# Patient Record
Sex: Male | Born: 1985 | Race: Black or African American | Hispanic: No | Marital: Single | Smoking: Current every day smoker
Health system: Southern US, Community
[De-identification: ages and names within clinical notes are randomized; demographics above are authoritative.]

## PROBLEM LIST (undated history)

## (undated) DIAGNOSIS — F32A Depression, unspecified: Secondary | ICD-10-CM

## (undated) DIAGNOSIS — R947 Abnormal results of other endocrine function studies: Secondary | ICD-10-CM

## (undated) DIAGNOSIS — F329 Major depressive disorder, single episode, unspecified: Secondary | ICD-10-CM

## (undated) DIAGNOSIS — F200 Paranoid schizophrenia: Secondary | ICD-10-CM

---

## 2014-06-27 ENCOUNTER — Emergency Department: Payer: Self-pay | Admitting: Emergency Medicine

## 2014-06-27 LAB — CBC
HCT: 53.3 % — AB (ref 40.0–52.0)
HGB: 17.7 g/dL (ref 13.0–18.0)
MCH: 31.2 pg (ref 26.0–34.0)
MCHC: 33.2 g/dL (ref 32.0–36.0)
MCV: 94 fL (ref 80–100)
Platelet: 198 10*3/uL (ref 150–440)
RBC: 5.67 10*6/uL (ref 4.40–5.90)
RDW: 13.8 % (ref 11.5–14.5)
WBC: 6.9 10*3/uL (ref 3.8–10.6)

## 2014-06-27 LAB — URINALYSIS, COMPLETE
BILIRUBIN, UR: NEGATIVE
BLOOD: NEGATIVE
Bacteria: NONE SEEN
GLUCOSE, UR: NEGATIVE mg/dL (ref 0–75)
Ketone: NEGATIVE
LEUKOCYTE ESTERASE: NEGATIVE
NITRITE: NEGATIVE
Ph: 7 (ref 4.5–8.0)
Protein: NEGATIVE
SPECIFIC GRAVITY: 1.015 (ref 1.003–1.030)

## 2014-06-27 LAB — DRUG SCREEN, URINE
AMPHETAMINES, UR SCREEN: NEGATIVE (ref ?–1000)
Barbiturates, Ur Screen: NEGATIVE (ref ?–200)
Benzodiazepine, Ur Scrn: NEGATIVE (ref ?–200)
Cannabinoid 50 Ng, Ur ~~LOC~~: NEGATIVE (ref ?–50)
Cocaine Metabolite,Ur ~~LOC~~: NEGATIVE (ref ?–300)
MDMA (ECSTASY) UR SCREEN: NEGATIVE (ref ?–500)
METHADONE, UR SCREEN: NEGATIVE (ref ?–300)
Opiate, Ur Screen: NEGATIVE (ref ?–300)
PHENCYCLIDINE (PCP) UR S: NEGATIVE (ref ?–25)
Tricyclic, Ur Screen: NEGATIVE (ref ?–1000)

## 2014-06-27 LAB — COMPREHENSIVE METABOLIC PANEL
ANION GAP: 4 — AB (ref 7–16)
Albumin: 4.1 g/dL (ref 3.4–5.0)
Alkaline Phosphatase: 76 U/L
BUN: 17 mg/dL (ref 7–18)
Bilirubin,Total: 0.5 mg/dL (ref 0.2–1.0)
CALCIUM: 9.4 mg/dL (ref 8.5–10.1)
CHLORIDE: 104 mmol/L (ref 98–107)
CO2: 30 mmol/L (ref 21–32)
Creatinine: 1.52 mg/dL — ABNORMAL HIGH (ref 0.60–1.30)
EGFR (African American): >60
GFR CALC NON AF AMER: 58 — AB
GLUCOSE: 53 mg/dL — AB (ref 65–99)
OSMOLALITY: 275 (ref 275–301)
POTASSIUM: 3.7 mmol/L (ref 3.5–5.1)
SGOT(AST): 36 U/L (ref 15–37)
SGPT (ALT): 46 U/L
SODIUM: 138 mmol/L (ref 136–145)
Total Protein: 8.1 g/dL (ref 6.4–8.2)

## 2014-06-27 LAB — ETHANOL: Ethanol: <3 mg/dL

## 2014-06-27 LAB — ACETAMINOPHEN LEVEL: Acetaminophen: <2 ug/mL

## 2014-06-27 LAB — SALICYLATE LEVEL: Salicylates, Serum: <1.7 mg/dL

## 2014-08-23 ENCOUNTER — Emergency Department: Payer: Self-pay | Admitting: Emergency Medicine

## 2014-10-15 NOTE — Consult Note (Signed)
Psychiatry: Follow up this aptient with schizophrenia. He has been med complient and calm throughout stay. Today reports he is calmer. Says he feels confident of not hurting anyone when he goes back. Says he feels safe and comfortable going to group home,  Still has AH but not acting on stimuli. Affect calm and appropriate. and no longer requires inpatient treatment. Counceling done. Continue changed medication and scripts done. Discussed with Dr Inocencio HomesGayle. Will DC the IVC and return to group home follow up with community mental health.Schizophrenia.  Electronic Signatures: Audery Amellapacs, [name] (MD)  (Signed on 14-Jan-16 11:00)  Authored  Last Updated: 14-Jan-16 11:00 by Audery Amellapacs, [name] (MD)
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
## 2014-10-15 NOTE — Consult Note (Signed)
Psychiatry: Follow-up for this patient with a history of schizophrenia.  No new complaints.  Continues to have intermittent hallucinations.  Denies any suicidal or homicidal ideation.  Compliant with medicines.  No findings on current review of systems. mental status neatly groomed young man looks his stated age.  Compliant with medication.  Eye contact a bit intense.  Speech decreased in total amount affect constricted.  Denies suicidal or homicidal ideation and endorses still occasional hallucinations. has not had any behavior problems aggression and threats or violence in the emergency room.  He has been compliant with medication.  I had ordered admission for this patient yesterday but admission was refused by the psychiatric nursing staff.  He is currently in the emergency room [facility] although I'm not sure if he really needs that level of care.  Continue current treatment with quetiapine.  May be ready to be discharged fairly soon if behavior continues to be calm. schizophrenia  Electronic Signatures: Audery Amellapacs, Sayge Salvato T (MD)  (Signed on 13-Jan-16 18:03)  Authored  Last Updated: 13-Jan-16 18:03 by Audery Amellapacs, Nyonna Hargrove T (MD)
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
## 2014-11-28 ENCOUNTER — Emergency Department
Admission: EM | Admit: 2014-11-28 | Discharge: 2014-11-29 | Disposition: A | Discharge status: Other Acute Inpatient Hospital | Payer: Medicaid Other | Attending: Student | Admitting: Student

## 2014-11-28 ENCOUNTER — Encounter: Payer: Self-pay | Admitting: *Deleted

## 2014-11-28 DIAGNOSIS — F1210 Cannabis abuse, uncomplicated: Secondary | ICD-10-CM | POA: Diagnosis not present

## 2014-11-28 DIAGNOSIS — F1310 Sedative, hypnotic or anxiolytic abuse, uncomplicated: Secondary | ICD-10-CM | POA: Insufficient documentation

## 2014-11-28 DIAGNOSIS — F200 Paranoid schizophrenia: Secondary | ICD-10-CM | POA: Insufficient documentation

## 2014-11-28 DIAGNOSIS — Z008 Encounter for other general examination: Secondary | ICD-10-CM | POA: Diagnosis present

## 2014-11-28 LAB — COMPREHENSIVE METABOLIC PANEL
ALBUMIN: 4.4 g/dL (ref 3.5–5.0)
ALT: 19 U/L (ref 17–63)
ANION GAP: 7 (ref 5–15)
AST: 29 U/L (ref 15–41)
Alkaline Phosphatase: 58 U/L (ref 38–126)
BUN: 22 mg/dL — AB (ref 6–20)
CALCIUM: 9.6 mg/dL (ref 8.9–10.3)
CO2: 27 mmol/L (ref 22–32)
CREATININE: 1.36 mg/dL — AB (ref 0.61–1.24)
Chloride: 106 mmol/L (ref 101–111)
GFR calc Af Amer: >60 mL/min (ref >60)
GFR calc non Af Amer: >60 mL/min (ref >60)
Glucose, Bld: 78 mg/dL (ref 65–99)
Potassium: 3.9 mmol/L (ref 3.5–5.1)
Sodium: 140 mmol/L (ref 135–145)
TOTAL PROTEIN: 7.5 g/dL (ref 6.5–8.1)
Total Bilirubin: 0.9 mg/dL (ref 0.3–1.2)

## 2014-11-28 LAB — URINE DRUG SCREEN, QUALITATIVE (ARMC ONLY)
Amphetamines, Ur Screen: NOT DETECTED
BENZODIAZEPINE, UR SCRN: NOT DETECTED
Barbiturates, Ur Screen: NOT DETECTED
Cannabinoid 50 Ng, Ur ~~LOC~~: POSITIVE — AB
Cocaine Metabolite,Ur ~~LOC~~: NOT DETECTED
MDMA (Ecstasy)Ur Screen: NOT DETECTED
METHADONE SCREEN, URINE: NOT DETECTED
Opiate, Ur Screen: NOT DETECTED
PHENCYCLIDINE (PCP) UR S: NOT DETECTED
Tricyclic, Ur Screen: POSITIVE — AB

## 2014-11-28 LAB — ETHANOL

## 2014-11-28 LAB — CBC
HCT: 46.6 % (ref 40.0–52.0)
HEMOGLOBIN: 15.9 g/dL (ref 13.0–18.0)
MCH: 31.6 pg (ref 26.0–34.0)
MCHC: 34 g/dL (ref 32.0–36.0)
MCV: 92.8 fL (ref 80.0–100.0)
PLATELETS: 148 10*3/uL — AB (ref 150–440)
RBC: 5.02 MIL/uL (ref 4.40–5.90)
RDW: 13.8 % (ref 11.5–14.5)
WBC: 5.6 10*3/uL (ref 3.8–10.6)

## 2014-11-28 LAB — SALICYLATE LEVEL: Salicylate Lvl: <4 mg/dL (ref 2.8–30.0)

## 2014-11-28 LAB — ACETAMINOPHEN LEVEL: Acetaminophen (Tylenol), Serum: <10 ug/mL — ABNORMAL LOW (ref 10–30)

## 2014-11-28 NOTE — ED Notes (Signed)
Pt given snack tray. 

## 2014-11-28 NOTE — ED Notes (Signed)
Pt has been up to the BR    He is standing in the dayroom watching TV

## 2014-11-28 NOTE — BH Assessment (Signed)
Assessment Note  Anthony Luna is an 29 y.o. male, presents to the ED with a h/o schizophrenia with hearing voices telling him to hurt himself and other people; states that, his brother recently died; and that, he does not want to be here anymore. "I have been hearing voices for a couple of days; they tell me to do stuff; they keep talking; i want them to stop; I don't want to go back to that group home; I don't like to take medicine."  Axis I: Schizoaffective Disorder Axis II: Deferred Axis III: History reviewed. No pertinent past medical history. Axis IV: other psychosocial or environmental problems, problems with access to health care services and problems with primary support group Axis V: 31-40 impairment in reality testing  Past Medical History: History reviewed. No pertinent past medical history.  History reviewed. No pertinent past surgical history.  Family History: History reviewed. No pertinent family history.  Social History:  has no tobacco, alcohol, and drug history on file.  Additional Social History:     CIWA: CIWA-Ar BP: 125/85 mmHg Pulse Rate: (!) 102 COWS:    Allergies: No Known Allergies  Home Medications:  (Not in a hospital admission)  OB/GYN Status:  No LMP for male patient.  General Assessment Data Location of Assessment: [facility] ED TTS Assessment: In system Is this a Tele or Face-to-Face Assessment?: Face-to-Face Is this an Initial Assessment or a Re-assessment for this encounter?: Re-Assessment Marital status: Single Maiden name: none Is patient pregnant?: No Pregnancy Status: No Living Arrangements: Group Home Can pt return to current living arrangement?: Yes Admission Status: Voluntary Is patient capable of signing voluntary admission?: Yes Referral Source: Other Insurance type: [place]  Medical Screening Exam [facility] Walk-in ONLY) Medical Exam completed: Yes  Crisis Care Plan Living Arrangements: Group Home Name of Psychiatrist:  unknown Name of Therapist: unknown  Education Status Is patient currently in school?: No Current Grade: n/a Highest grade of school patient has completed: 10th Name of school: n/a Contact person: unknown  Risk to self with the past 6 months Suicidal Ideation: Yes-Currently Present (per the voices) Has patient been a risk to self within the past 6 months prior to admission? : No Suicidal Intent: No Has patient had any suicidal intent within the past 6 months prior to admission? : No Is patient at risk for suicide?: No Suicidal Plan?: No Has patient had any suicidal plan within the past 6 months prior to admission? : No Access to Means: No What has been your use of drugs/alcohol within the last 12 months?: uds (t) cannabis Previous Attempts/Gestures: No How many times?: 0 Other Self Harm Risks: hearing voices Triggers for Past Attempts: None known Intentional Self Injurious Behavior: None Family Suicide History: Unknown Recent stressful life event(s): Other (Comment) Persecutory voices/beliefs?: Yes Depression: Yes Depression Symptoms: Despondent Substance abuse history and/or treatment for substance abuse?: Yes Suicide prevention information given to non-admitted patients: Yes  Risk to Others within the past 6 months Homicidal Ideation: No Does patient have any lifetime risk of violence toward others beyond the six months prior to admission? : No Thoughts of Harm to Others: No Current Homicidal Intent: No Current Homicidal Plan: No Access to Homicidal Means: No Identified Victim: none History of harm to others?: No Assessment of Violence: On admission Violent Behavior Description: denies Does patient have access to weapons?: No Criminal Charges Pending?: No Does patient have a court date: No Is patient on probation?: No  Psychosis Hallucinations: Auditory Delusions: Unspecified  Mental  Status Report Appearance/Hygiene: In scrubs, Unremarkable Eye Contact:  Poor Motor Activity: Unremarkable Speech: Slow, Incoherent Level of Consciousness: Drowsy, Sleeping Mood: Other (Comment) Affect: Inconsistent with thought content Anxiety Level: Minimal Thought Processes: Circumstantial Judgement: Impaired Orientation: Person Obsessive Compulsive Thoughts/Behaviors: Minimal  Cognitive Functioning Concentration: Fair Memory: Recent Impaired Insight: Poor Impulse Control: Fair Appetite: Good Weight Loss: 0 Weight Gain: 0 Sleep: Decreased Total Hours of Sleep: 3 Vegetative Symptoms: None  ADLScreening [facility] Assessment Services) Patient's cognitive ability adequate to safely complete daily activities?: Yes Patient able to express need for assistance with ADLs?: Yes Independently performs ADLs?: Yes (appropriate for developmental age)  Prior Inpatient Therapy Prior Inpatient Therapy:  (unknown) Prior Therapy Dates: unknown Prior Therapy Facilty/Provider(s): unknown Reason for Treatment: shizophrenia  Prior Outpatient Therapy Prior Outpatient Therapy: Yes Prior Therapy Dates: unknown Prior Therapy Facilty/Provider(s): unknown Reason for Treatment: schizophrenia Does patient have an ACCT team?: Unknown Does patient have Intensive In-House Services?  : Unknown Does patient have [facility] services? : Unknown Does patient have P4CC services?: Unknown  ADL Screening (condition at time of admission) Patient's cognitive ability adequate to safely complete daily activities?: Yes Patient able to express need for assistance with ADLs?: Yes Independently performs ADLs?: Yes (appropriate for developmental age)       Abuse/Neglect Assessment (Assessment to be complete while patient is alone) Physical Abuse: Denies Verbal Abuse: Denies Sexual Abuse: Denies Exploitation of patient/patient's resources: Denies Self-Neglect: Denies Values / Beliefs Cultural Requests During Hospitalization: None Spiritual Requests During Hospitalization:  None Consults Spiritual Care Consult Needed: No Social Work Consult Needed: No      Additional Information 1:1 In Past 12 Months?:  (unknown) CIRT Risk: No Elopement Risk: No Does patient have medical clearance?: Yes  Child/Adolescent Assessment Running Away Risk: Denies Bed-Wetting: Denies Destruction of Property: Denies Cruelty to Animals: Denies Stealing: Denies Rebellious/Defies Authority: Denies Satanic Involvement: Denies Archivist: Denies Problems at [employer]: Denies Gang Involvement: Denies  Disposition:  Disposition Initial Assessment Completed for this Encounter: Yes Disposition of Patient: Referred to (psych MD to see)  On Site Evaluation by:   Reviewed with Physician:    Dwan Bolt 11/28/2014 7:17 PM

## 2014-11-28 NOTE — ED Notes (Signed)
Patient observed lying in bed with eyes closed  Even, unlabored respirations observed   NAD pt appears to be sleeping  I will continue to monitor along with every 15 minute visual observations and ongoing security camera monitoring    

## 2014-11-28 NOTE — ED Notes (Signed)

## 2014-11-28 NOTE — ED Notes (Signed)
BEHAVIORAL HEALTH ROUNDING Patient sleeping: No. Patient alert and oriented: yes Behavior appropriate: Yes.   Nutrition and fluids offered: Yes  Toileting and hygiene offered: Yes  Sitter present: yes Law enforcement present: Yes   Lunch tray given to pt

## 2014-11-28 NOTE — ED Notes (Signed)

## 2014-11-28 NOTE — ED Notes (Signed)
BEHAVIORAL HEALTH ROUNDING Patient sleeping: No. Patient alert and oriented: yes Behavior appropriate: Yes.  ; If no, describe:  Nutrition and fluids offered: yes Toileting and hygiene offered: Yes  Sitter present: q15 minute observations and security camera monitoring Law enforcement present: Yes  ODS  

## 2014-11-28 NOTE — ED Notes (Signed)
BEHAVIORAL HEALTH ROUNDING Patient sleeping: No. Patient alert and oriented: yes Behavior appropriate: Yes.  ;  Nutrition and fluids offered: Yes  Toileting and hygiene offered: Yes  Sitter present: yes Law enforcement present: Yes  

## 2014-11-28 NOTE — ED Provider Notes (Signed)
-----------------------------------------   3:35 PM on 11/28/2014 -----------------------------------------   BP 125/85 mmHg  Pulse 102  Temp(Src) 98.6 F (37 C) (Oral)  Resp 18  Ht 6\' 1"  (1.854 m)  Wt 189 lb (85.73 kg)  BMI 24.94 kg/m2  SpO2 96%  Spoke with Dr. Toni Amend in person who will admit this patient for further treatment.  He is stable at this time.  Loleta Rose, MD 11/28/14 1536

## 2014-11-28 NOTE — ED Notes (Signed)
Report received from Amy RN. Patient care assumed. Patient/RN introduction complete. Will continue to monitor.  

## 2014-11-28 NOTE — ED Notes (Signed)
Pt laying in bed.  

## 2014-11-28 NOTE — ED Notes (Signed)
Pt sitting in room watching tv, calm and cooperative at this time pending admission to beh medicine.

## 2014-11-28 NOTE — ED Notes (Signed)
Supper provided along with an extra drink  Pt observed with no unusual behavior  Appropriate to stimulation  No verbalized needs or concerns at this time  NAD assessed  md [name] ordered pt to be admitted to [facility] when staff and bed become available   Continue to monitor

## 2014-11-28 NOTE — ED Notes (Signed)
ED BHU PLACEMENT JUSTIFICATION Is the patient under IVC or is there intent for IVC: No. Is the patient medically cleared: Yes.   Is there vacancy in the ED BHU: Yes.   Is the population mix appropriate for patient: Yes.   Is the patient awaiting placement in inpatient or outpatient setting: Yes.   Has the patient had a psychiatric consult: Yes.   Survey of unit performed for contraband, proper placement and condition of furniture, tampering with fixtures in bathroom, shower, and each patient room: Yes.  ; Findings:  APPEARANCE/BEHAVIOR calm, cooperative and adequate rapport can be established NEURO ASSESSMENT Orientation: time, place and person Hallucinations: No.None noted (Hallucinations) Speech: Normal Gait: normal RESPIRATORY ASSESSMENT Normal expansion.  Clear to auscultation.  No rales, rhonchi, or wheezing. CARDIOVASCULAR ASSESSMENT regular rate and rhythm, S1, S2 normal, no murmur, click, rub or gallop GASTROINTESTINAL ASSESSMENT soft, nontender, BS WNL, no r/g EXTREMITIES normal strength, tone, and muscle mass PLAN OF CARE Provide calm/safe environment. Vital signs assessed twice daily. ED BHU Assessment once each 12-hour shift. Collaborate with intake RN daily or as condition indicates. Assure the ED provider has rounded once each shift. Provide and encourage hygiene. Provide redirection as needed. Assess for escalating behavior; address immediately and inform ED provider.  Assess family dynamic and appropriateness for visitation as needed: Yes.  ; If necessary, describe findings:  Educate the patient/family about BHU procedures/visitation: Yes.  ; If necessary, describe findings:  

## 2014-11-28 NOTE — ED Provider Notes (Signed)
[facility] Emergency Department Provider Note  ____________________________________________  Time seen: Approximately 945 AM  I have reviewed the triage vital signs and the nursing notes.   HISTORY  Chief Complaint Psychiatric Evaluation    HPI Anthony Luna is a 29 y.o. male with a past history of schizophrenia who presents today without complaint. For his group home he has been saying that he has been hearing voices to kill himself and per nursing triage says my brother died not just wanted be there anymore. He denies any of this to me. Denies any plan for SI or HI. Denies hearing voices at this time. Denies any harmful acts or attempts to hurt himself with ingestion. Admits to smoking marijuana yesterday. Denies smoking cocaine.   Medical history pertinent for schizophrenia  There are no active problems to display for this patient.   History reviewed. No pertinent past surgical history.  No current outpatient prescriptions on file.  Allergies Review of patient's allergies indicates no known allergies.  History reviewed. No pertinent family history.  Social History Smokes one half pack per day of cigarettes. No drinking. Admits to smoking marijuana.  Review of Systems Constitutional: No fever/chills Eyes: No visual changes. ENT: No sore throat. Cardiovascular: Denies chest pain. Respiratory: Denies shortness of breath. Gastrointestinal: No abdominal pain.  No nausea, no vomiting.  No diarrhea.  No constipation. Genitourinary: Negative for dysuria. Musculoskeletal: Negative for back pain. Skin: Negative for rash. Neurological: Negative for headaches, focal weakness or numbness. Psychiatric:Denies any psychiatric complaints at this time.  10-point ROS otherwise negative.  ____________________________________________   PHYSICAL EXAM:  VITAL SIGNS: ED Triage Vitals  Enc Vitals Group     BP 11/28/14 0907 125/85 mmHg     Pulse  Rate 11/28/14 0907 102     Resp 11/28/14 0907 18     Temp 11/28/14 0907 98.6 F (37 C)     Temp Source 11/28/14 0907 Oral     SpO2 11/28/14 0907 96 %     Weight 11/28/14 0907 189 lb (85.73 kg)     Height 11/28/14 0907  (1.854 m)     Head Cir --      Peak Flow --      Pain Score --      Pain Loc --      Pain Edu? --      Excl. in GC? --     Constitutional: Alert and oriented. Well appearing and in no acute distress. Eyes: Conjunctivae are normal. PERRL. EOMI. Head: Atraumatic. Nose: No congestion/rhinnorhea. Mouth/Throat: Mucous membranes are moist.  Oropharynx non-erythematous. Neck: No stridor.   Cardiovascular: Normal rate, regular rhythm. Grossly normal heart sounds.  Good peripheral circulation. Respiratory: Normal respiratory effort.  No retractions. Lungs CTAB. Gastrointestinal: Soft and nontender. No distention. No abdominal bruits. No CVA tenderness. Musculoskeletal: No lower extremity tenderness nor edema.  No joint effusions. Neurologic:  Normal speech and language. No gross focal neurologic deficits are appreciated. Speech is normal. No gait instability. Skin:  Skin is warm, dry and intact. No rash noted. Psychiatric: Flat affect. Not overtly responding to any internal stimuli. Denies suicidal or homicidal ideation. Speech and behavior are normal.  ____________________________________________   LABS (all labs ordered are listed, but only abnormal results are displayed)  Labs Reviewed  ACETAMINOPHEN LEVEL - Abnormal; Notable for the following:    Acetaminophen (Tylenol), Serum <10 (*)    All other components within normal limits  CBC - Abnormal; Notable for the following:  Platelets 148 (*)    All other components within normal limits  COMPREHENSIVE METABOLIC PANEL - Abnormal; Notable for the following:    BUN 22 (*)    Creatinine, Ser 1.36 (*)    All other components within normal limits  URINE DRUG SCREEN, QUALITATIVE ([facility] ONLY) - Abnormal; Notable  for the following:    Tricyclic, Ur Screen POSITIVE (*)    Cannabinoid 50 Ng, Ur [place] POSITIVE (*)    All other components within normal limits  ETHANOL  SALICYLATE LEVEL   ____________________________________________  EKG   ____________________________________________  RADIOLOGY   ____________________________________________   PROCEDURES   ____________________________________________   INITIAL IMPRESSION / ASSESSMENT AND PLAN / ED COURSE  Pertinent labs & imaging results that were available during my care of the patient were reviewed by me and considered in my medical decision making (see chart for details).  ----------------------------------------- 10:58 AM on 11/28/2014 -----------------------------------------  Patient, and cooperative and denying any SI, HI or hallucinations at this time. Will not IVC at this time. We will have psychiatry and behavioral intake assessed patient. ____________________________________________   FINAL CLINICAL IMPRESSION(S) / ED DIAGNOSES  Acute schizophrenia. Return visit.    Myrna Blazer, MD 11/28/14 1059

## 2014-11-28 NOTE — ED Notes (Signed)
Pt transferred into ED BHU room 5    Patient assigned to appropriate care area. Patient oriented to unit/care area: Informed that, for their safety, care areas are designed for safety and monitored by security cameras at all times; Visiting hours and phone times explained to patient. Patient verbalizes understanding, and verbal contract for safety obtained.     

## 2014-11-28 NOTE — ED Notes (Signed)
ENVIRONMENTAL ASSESSMENT Potentially harmful objects out of patient reach: Yes.   Personal belongings secured: Yes.   Patient dressed in hospital provided attire only: Yes.   Plastic bags out of patient reach: Yes.   Patient care equipment (cords, cables, call bells, lines, and drains) shortened, removed, or accounted for: Yes.   Equipment and supplies removed from bottom of stretcher: Yes.   Potentially toxic materials out of patient reach: Yes.   Sharps container removed or out of patient reach: Yes.     BEHAVIORAL HEALTH ROUNDING Patient sleeping: No. Patient alert and oriented: yes Behavior appropriate: Yes.  ; If no, describe:  Nutrition and fluids offered: yes Toileting and hygiene offered: Yes  Sitter present: q15 minute observations and security camera monitoring Law enforcement present: Yes  ODS  

## 2014-11-28 NOTE — ED Notes (Signed)

## 2014-11-28 NOTE — ED Notes (Signed)
Pt denies SI or HI stating "My brother died and I just dont want to be there anymore", pt then proceeds to put his face in his hands and go quiet

## 2014-11-28 NOTE — ED Notes (Signed)
BEHAVIORAL HEALTH ROUNDING Patient sleeping: Yes.   Patient alert and oriented: eyes closed  Appears asleep Behavior appropriate: Yes.  ; If no, describe:  Nutrition and fluids offered: Yes  Toileting and hygiene offered: sleeping Sitter present: q 15 minute observations and security camera monitoring Law enforcement present: yes  ODS 

## 2014-11-28 NOTE — ED Notes (Signed)
[name] is in consulting with pt at this time  Pt observed with no unusual behavior  Appropriate to stimulation  No verbalized needs or concerns at this time  NAD assessed  Continue to monitor
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
## 2014-11-28 NOTE — ED Notes (Signed)
ED BHU PLACEMENT JUSTIFICATION Is the patient under IVC or is there intent for IVC: no Is the patient medically cleared: Yes.   Is there vacancy in the ED BHU: Yes.   Is the population mix appropriate for patient: Yes.   Is the patient awaiting placement in inpatient or outpatient setting: No. Has the patient had a psychiatric consult: No. Survey of unit performed for contraband, proper placement and condition of furniture, tampering with fixtures in bathroom, shower, and each patient room: Yes.  ; Findings:  APPEARANCE/BEHAVIOR Calm and cooperative NEURO ASSESSMENT Orientation: oriented x3  Denies pain Hallucinations: No.None noted (Hallucinations) Speech: Normal Gait: normal RESPIRATORY ASSESSMENT even unlabored respirations  CARDIOVASCULAR ASSESSMENT Pulses equal  regular rate  Skin warm and dry GASTROINTESTINAL ASSESSMENT no GI complaint EXTREMITIES Full ROM PLAN OF CARE Provide calm/safe environment. Vital signs assessed twice daily. ED BHU Assessment once each 12-hour shift. Collaborate with intake RN daily or as condition indicates. Assure the ED provider has rounded once each shift. Provide and encourage hygiene. Provide redirection as needed. Assess for escalating behavior; address immediately and inform ED provider.  Assess family dynamic and appropriateness for visitation as needed: Yes.  ; If necessary, describe findings:  Educate the patient/family about BHU procedures/visitation: Yes.  ; If necessary, describe findings:   

## 2014-11-28 NOTE — ED Notes (Signed)
Pt observed sitting in the dayroom with the blanket over his head    Appropriate to stimulation  No verbalized needs or concerns at this time  NAD assessed  Continue to monitor

## 2014-11-28 NOTE — ED Notes (Signed)
Pt brought in from [facility] group home with reports from staff that pt has been reporting hearing voices that are telling him to hurt himself. Pt denies this. Pt states that he hears voices but they are telling him to "just listen". Caregiver reports that pt has been running away at night and smoking crack and marijuana.

## 2014-11-29 ENCOUNTER — Inpatient Hospital Stay
Admission: EM | Admit: 2014-11-29 | Discharge: 2014-12-11 | DRG: 885 | Disposition: A | Discharge status: Home / Self Care | Payer: Medicaid Other | Source: Intra-hospital | Attending: Psychiatry | Admitting: Psychiatry

## 2014-11-29 DIAGNOSIS — F17210 Nicotine dependence, cigarettes, uncomplicated: Secondary | ICD-10-CM | POA: Diagnosis present

## 2014-11-29 DIAGNOSIS — R451 Restlessness and agitation: Secondary | ICD-10-CM | POA: Diagnosis present

## 2014-11-29 DIAGNOSIS — F1290 Cannabis use, unspecified, uncomplicated: Secondary | ICD-10-CM | POA: Diagnosis present

## 2014-11-29 DIAGNOSIS — G2571 Drug induced akathisia: Secondary | ICD-10-CM | POA: Diagnosis present

## 2014-11-29 DIAGNOSIS — F1220 Cannabis dependence, uncomplicated: Secondary | ICD-10-CM

## 2014-11-29 DIAGNOSIS — E8881 Metabolic syndrome: Secondary | ICD-10-CM | POA: Diagnosis present

## 2014-11-29 DIAGNOSIS — Z638 Other specified problems related to primary support group: Secondary | ICD-10-CM

## 2014-11-29 DIAGNOSIS — F250 Schizoaffective disorder, bipolar type: Principal | ICD-10-CM | POA: Diagnosis present

## 2014-11-29 DIAGNOSIS — Z818 Family history of other mental and behavioral disorders: Secondary | ICD-10-CM

## 2014-11-29 DIAGNOSIS — R45851 Suicidal ideations: Secondary | ICD-10-CM | POA: Diagnosis present

## 2014-11-29 DIAGNOSIS — F17200 Nicotine dependence, unspecified, uncomplicated: Secondary | ICD-10-CM

## 2014-11-29 DIAGNOSIS — Z79899 Other long term (current) drug therapy: Secondary | ICD-10-CM

## 2014-11-29 HISTORY — DX: Abnormal results of other endocrine function studies: R94.7

## 2014-11-29 LAB — LIPID PANEL
Cholesterol: 198 mg/dL (ref 0–200)
HDL: 53 mg/dL (ref >40)
LDL Cholesterol: 134 mg/dL — ABNORMAL HIGH (ref 0–99)
Total CHOL/HDL Ratio: 3.7 RATIO
Triglycerides: 57 mg/dL (ref <150)
VLDL: 11 mg/dL (ref 0–40)

## 2014-11-29 MED ORDER — ACETAMINOPHEN 325 MG PO TABS
650.0000 mg | ORAL_TABLET | Freq: Four times a day (QID) | ORAL | Status: DC | PRN
  Administered 2014-12-04 – 2014-12-05 (×2): 650 mg via ORAL
  Filled 2014-11-29 (×2): qty 2

## 2014-11-29 MED ORDER — BENZTROPINE MESYLATE 1 MG PO TABS
1.0000 mg | ORAL_TABLET | Freq: Two times a day (BID) | ORAL | Status: DC
  Administered 2014-11-29 – 2014-12-04 (×10): 1 mg via ORAL
  Filled 2014-11-29 (×10): qty 1

## 2014-11-29 MED ORDER — ALUM & MAG HYDROXIDE-SIMETH 200-200-20 MG/5ML PO SUSP
30.0000 mL | ORAL | Status: DC | PRN
Start: 2014-11-29 — End: 2014-12-11

## 2014-11-29 MED ORDER — MAGNESIUM HYDROXIDE 400 MG/5ML PO SUSP
30.0000 mL | Freq: Every day | ORAL | Status: DC | PRN
Start: 2014-11-29 — End: 2014-12-11

## 2014-11-29 MED ORDER — LAMOTRIGINE 25 MG PO TABS
200.0000 mg | ORAL_TABLET | Freq: Every day | ORAL | Status: DC
Start: 2014-11-29 — End: 2014-11-30
  Administered 2014-11-29: 200 mg via ORAL
  Filled 2014-11-29: qty 8

## 2014-11-29 MED ORDER — QUETIAPINE FUMARATE 200 MG PO TABS
300.0000 mg | ORAL_TABLET | Freq: Every day | ORAL | Status: DC
  Administered 2014-11-29: 300 mg via ORAL
  Filled 2014-11-29: qty 1

## 2014-11-29 NOTE — ED Notes (Signed)

## 2014-11-29 NOTE — ED Notes (Addendum)
Pt observed standing in his room with his blanket wrapped around him   Appropriate to stimulation  No verbalized needs or concerns at this time  NAD assessed  Continue to monitor

## 2014-11-29 NOTE — ED Notes (Signed)
Pt laying in bed.  

## 2014-11-29 NOTE — ED Notes (Signed)
BEHAVIORAL HEALTH ROUNDING Patient sleeping: No. Patient alert and oriented: yes Behavior appropriate: Yes.  ; If no, describe:  Nutrition and fluids offered: yes Toileting and hygiene offered: Yes  Sitter present: q15 minute observations and security camera monitoring Law enforcement present: Yes  ODS  

## 2014-11-29 NOTE — Tx Team (Signed)
Initial Interdisciplinary Treatment Plan   PATIENT STRESSORS: Loss of brother Medication change or noncompliance   PATIENT STRENGTHS: Average or above average intelligence Communication skills Motivation for treatment/growth   PROBLEM LIST: Problem List/Patient Goals Date to be addressed Date deferred Reason deferred Estimated date of resolution  Suicidal ideation 11/29/2014     Hallucination 11/29/2014                                                DISCHARGE CRITERIA:  Ability to meet basic life and health needs Improved stabilization in mood, thinking, and/or behavior Motivation to continue treatment in a less acute level of care  PRELIMINARY DISCHARGE PLAN: Attend aftercare/continuing care group Attend PHP/IOP Placement in alternative living arrangements  PATIENT/FAMIILY INVOLVEMENT: This treatment plan has been presented to and reviewed with the patient, Anthony Luna, and/or family member,   The patient and family have been given the opportunity to ask questions and make suggestions.  Anthony Luna 11/29/2014, 4:02 PM

## 2014-11-29 NOTE — ED Notes (Signed)
Report called to [facility]  GiGi Rn  Pt to be admitted with paranoid schizophrenia     Appropriate to stimulation  No verbalized needs or concerns at this time  NAD assessed  Continue to monitor

## 2014-11-29 NOTE — BHH Counselor (Signed)
Pt. is to be admitted to [facility] by Dr. Toni Amend. Attending Physician will be Dr. Ardyth Harps.  Pt. has been assigned to room 312, by [facility] Charge Nurse Gwyn F. Pt.bed will be available after 2pm. Intake Paper Work has been signed and placed on pt. chart. ER staff Misty Stanley, ER Sect., Amy T., RN & Lorene Dy, Pt. Access.) have been made aware of the admission.

## 2014-11-29 NOTE — ED Notes (Signed)

## 2014-11-29 NOTE — ED Notes (Signed)
Pt walking around the room calm and cooperative at this time

## 2014-11-29 NOTE — ED Notes (Signed)
Breakfast provided  Appropriate to stimulation  No verbalized needs or concerns at this time  NAD assessed  Continue to monitor 

## 2014-11-29 NOTE — ED Notes (Signed)
Patient observed lying in bed with eyes closed  Even, unlabored respirations observed   NAD pt appears to be sleeping  I will continue to monitor along with every 15 minute visual observations and ongoing security camera monitoring    

## 2014-11-29 NOTE — Progress Notes (Signed)
29yrs old male admitted with suicidal ideation & hallucination.He was depressed & have suicidal thoughts since his younger brother died 2 weeks ago.He is positive for auditory hallucination telling him to hurt himself.He is from a group home.Pleasant & cooperative with the interview.No contraband found on admission.

## 2014-11-29 NOTE — ED Notes (Signed)

## 2014-11-29 NOTE — ED Provider Notes (Signed)
-----------------------------------------   6:56 AM on 11/29/2014 -----------------------------------------   BP 118/62 mmHg  Pulse 94  Temp(Src) 98.3 F (36.8 C) (Oral)  Resp 18  Ht 6\' 1"  (1.854 m)  Wt 189 lb (85.73 kg)  BMI 24.94 kg/m2  SpO2 99%  The patient had no acute events since last update.  Calm and cooperative at this time.  She'll be admitted to the psych service.     Rebecka Apley, MD 11/29/14 [phone]

## 2014-11-29 NOTE — ED Notes (Signed)
Lunch provided along with an extra drink   Appropriate to stimulation  No verbalized needs or concerns at this time  NAD assessed  Continue to monitor 

## 2014-11-30 DIAGNOSIS — F1220 Cannabis dependence, uncomplicated: Secondary | ICD-10-CM

## 2014-11-30 DIAGNOSIS — F17200 Nicotine dependence, unspecified, uncomplicated: Secondary | ICD-10-CM

## 2014-11-30 DIAGNOSIS — F250 Schizoaffective disorder, bipolar type: Secondary | ICD-10-CM

## 2014-11-30 LAB — TSH: TSH: 1.339 u[IU]/mL (ref 0.350–4.500)

## 2014-11-30 LAB — HEMOGLOBIN A1C: Hgb A1c MFr Bld: 4.9 % (ref 4.0–6.0)

## 2014-11-30 MED ORDER — FLUPHENAZINE HCL 5 MG PO TABS
10.0000 mg | ORAL_TABLET | Freq: Two times a day (BID) | ORAL | Status: DC
  Administered 2014-12-01 – 2014-12-04 (×7): 10 mg via ORAL
  Filled 2014-11-30 (×9): qty 2

## 2014-11-30 MED ORDER — LITHIUM CARBONATE ER 450 MG PO TBCR
450.0000 mg | EXTENDED_RELEASE_TABLET | Freq: Two times a day (BID) | ORAL | Status: DC
  Administered 2014-11-30 – 2014-12-04 (×9): 450 mg via ORAL
  Filled 2014-11-30 (×9): qty 1

## 2014-11-30 MED ORDER — BENZTROPINE MESYLATE 1 MG PO TABS: 1.0000 mg | ORAL_TABLET | Freq: Two times a day (BID) | ORAL | Status: DC

## 2014-11-30 MED ORDER — QUETIAPINE FUMARATE 200 MG PO TABS
300.0000 mg | ORAL_TABLET | Freq: Two times a day (BID) | ORAL | Status: DC
  Administered 2014-11-30 – 2014-12-01 (×3): 300 mg via ORAL
  Filled 2014-11-30 (×3): qty 1

## 2014-11-30 MED ORDER — LORAZEPAM 2 MG PO TABS
2.0000 mg | ORAL_TABLET | Freq: Four times a day (QID) | ORAL | Status: DC | PRN
  Administered 2014-11-30 – 2014-12-05 (×4): 2 mg via ORAL
  Filled 2014-11-30 (×4): qty 1

## 2014-11-30 MED ORDER — NICOTINE 10 MG IN INHA: 1.0000 | RESPIRATORY_TRACT | Status: DC | PRN

## 2014-11-30 MED ORDER — LAMOTRIGINE 100 MG PO TABS
200.0000 mg | ORAL_TABLET | Freq: Every day | ORAL | Status: DC
  Administered 2014-11-30 – 2014-12-07 (×8): 200 mg via ORAL
  Filled 2014-11-30 (×11): qty 2

## 2014-11-30 NOTE — Progress Notes (Signed)
D: Patient denies SI/HI/AVH.  Patient affect and mood are anxious.  Patient did attend evening group. Patient visible on the milieu. No distress noted. A: Support and encouragement offered. Scheduled medications given to pt. Q 15 min checks continued for patient safety. R: Patient receptive. Patient remains safe on the unit.   

## 2014-11-30 NOTE — Progress Notes (Signed)
Recreation Therapy Notes  Date: 06.16.16 Time: 3:05 pm Location: Craft Room  Group Topic: Coping Skills/Leisure Education  Goal Area(s) Addresses:  Patient will identify things they are grateful for. Patient will identify how being grateful can influence your decision making.  Behavioral Response: Attentive, Interactive  Intervention: Grateful Wheel  Activity: Patients were given an "I Am Grateful For" worksheet and instructed to list at least one thing they were grateful for under each category.   Education: LRT educated patient on leisure and why it is important to implement it into their schedules.  Education Outcome: Acknowledges education/In group clarification offered  Clinical Observations/Feedback: Patient participated in group activity. Patient did not contribute to group discussion.  Jacquelynn Cree, LRT/CTRS 11/30/2014 4:54 PM

## 2014-11-30 NOTE — [facility] Group Notes (Signed)
[facility] Group Notes:  (Nursing/MHT/Case Management/Adjunct)  Date:  11/30/2014  Time:  2:03 PM  Type of Therapy:  Movement Therapy  Participation Level:  Active  Participation Quality:  Attentive  Affect:  Excited  Cognitive:  Disorganized, Confused and Delusional  Insight:  Lacking  Engagement in Group:  Lacking  Modes of Intervention:  Activity  Summary of Progress/Problems:  Anthony Luna 11/30/2014, 2:03 PM

## 2014-11-30 NOTE — [facility] Group Notes (Signed)
[facility] LCSW Group Therapy  11/30/2014 4:30 PM  Type of Therapy:  Group Therapy  Participation Level:  Active  Participation Quality:  Appropriate  Affect:  Appropriate  Cognitive:  Appropriate  Insight:  Engaged  Engagement in Therapy:  Engaged  Modes of Intervention:  Discussion, Education, Exploration and Support  Summary of Progress/Problems:Group discussion was on honesty and trust issues. Patient had good responses to his peers and willingness to listen  Cheron Schaumann 11/30/2014, 4:30 PM

## 2014-11-30 NOTE — [facility] Group Notes (Signed)
[facility] Group Notes:  (Nursing/MHT/Case Management/Adjunct)  Date:  11/30/2014  Time:  10:15 AM  Type of Therapy:  goals  Participation Level:  Minimal  Participation Quality:  came late  Affect:  Anxious  Cognitive:  Appropriate  Insight:  Good  Engagement in Group:  Limited  Modes of Intervention:  goal setting   Summary of Progress/Problems:  Anthony Luna 11/30/2014, 10:15 AM

## 2014-11-30 NOTE — Plan of Care (Signed)
Problem: Ineffective individual coping Goal: STG:Pt. will utilize relaxation techniques to reduce stress STG: Patient will utilize relaxation techniques to reduce stress levels  Outcome: Progressing Patient playing cards with peers with dayroom. Interacting with peers pleasantly.

## 2014-11-30 NOTE — Progress Notes (Signed)
Recreation Therapy Notes  INPATIENT RECREATION THERAPY ASSESSMENT  Patient Details Name: Chadd Willadsen MRN: [id number] DOB: [birth year] Today's Date: 2014/12/19  Patient Stressors: Death  Coping Skills:   Isolate, Substance Abuse, Avoidance, Exercise, Art/Dance, Talking, Music, Sports, Other (Comment) (Deep breathing)  Personal Challenges: Decision-Making, Expressing Yourself, Stress Management, Substance Abuse, Time Management, Trusting Others  Leisure Interests (2+):  Sports - Basketball, Individual - Other (Comment), Games - Cards  Awareness of Community Resources:  No  Community Resources:     Current Use:    If no, Barriers?:    Patient Strengths:  Healthy, beautiful  Patient Identified Areas of Improvement:  Yes, but does not know  Current Recreation Participation:  Going to programs at [employer], bingo, eating at [employer]  Patient Goal for Hospitalization:  To be respectful to everyone around him, take his medicine on time, and to do the best he can.  City of Residence:  [place] of Residence:  [place]   Current SI (including self-harm):  Yes  Current HI:  No (Patient reported sometimes he has thoughts of hurting others)  Consent to Intern Participation: N/A   Jacquelynn Cree, LRT/CTRS 2014/12/19, 5:32 PM

## 2014-11-30 NOTE — [facility] Group Notes (Signed)
[facility] Group Notes:  (Nursing/MHT/Case Management/Adjunct)  Date:  11/30/2014  Time:  1:15 AM  Type of Therapy:  Group Therapy  Participation Level:  Active  Participation Quality:  Appropriate  Affect:  Appropriate  Cognitive:  Appropriate  Insight:  Good  Engagement in Group:  Engaged  Modes of Intervention:  n/a  Summary of Progress/Problems:  Veva Holes 11/30/2014, 1:15 AM

## 2014-11-30 NOTE — [facility] Suicide Risk Assessment (Signed)
[facility] Admission Suicide Risk Assessment   Nursing information obtained from:  Patient Demographic factors:  Male Current Mental Status:  Self-harm thoughts Loss Factors:  Loss of significant relationship Historical Factors:  NA Risk Reduction Factors:  NA Total Time spent with patient: 1 hour Principal Problem: Schizoaffective disorder, bipolar type Diagnosis:   Patient Active Problem List   Diagnosis Date Noted  . Cannabis use disorder, severe, dependence [F12.20] 11/30/2014  . Schizoaffective disorder, bipolar type [F25.0] 11/30/2014  . Tobacco use disorder [Z72.0] 11/30/2014  . Grief [F43.21] 11/28/2014     Continued Clinical Symptoms:  Alcohol Use Disorder Identification Test Final Score (AUDIT): 1 The "Alcohol Use Disorders Identification Test", Guidelines for Use in Primary Care, Second Edition.  World Science writer [facility]). Score between 0-7:  no or low risk or alcohol related problems. Score between 8-15:  moderate risk of alcohol related problems. Score between 16-19:  high risk of alcohol related problems. Score 20 or above:  warrants further diagnostic evaluation for alcohol dependence and treatment.   CLINICAL FACTORS:   Severe Anxiety and/or Agitation Alcohol/Substance Abuse/Dependencies More than one psychiatric diagnosis Currently Psychotic Previous Psychiatric Diagnoses and Treatments    Psychiatric Specialty Exam: Physical Exam  ROS    COGNITIVE FEATURES THAT CONTRIBUTE TO RISK:  None    SUICIDE RISK:   Moderate:  Frequent suicidal ideation with limited intensity, and duration, some specificity in terms of plans, no associated intent, good self-control, limited dysphoria/symptomatology, some risk factors present, and identifiable protective factors, including available and accessible social support.  PLAN OF CARE: Admit to behavioral health  Medical Decision Making:  Established Problem, Worsening (2)  I certify that inpatient services  furnished can reasonably be expected to improve the patient's condition.   Anthony Luna 11/30/2014, 1:51 PM

## 2014-11-30 NOTE — Progress Notes (Signed)
Pt visible on the unit. Observed responding to internal stimuli. Pt states he is hearing voices. Anxious pacing. Ativan given x 1 effective. Attended group. Med compliant.

## 2014-11-30 NOTE — Progress Notes (Signed)
Patient's mood has remained mostly calm and cooperative today. He was slightly agitated when labs were being drawn, and he was started on new medications that he didn't want to take. He eventually took Lithium and Seroquel but did not take Prolixin. Patient denies pain. Denies SI/HI/AVH. Discussed emotions related to his brother dying. York Spaniel he was very close to this brother and was upset with his family when his grandmother wouldn't let him come home for his funeral. He said he understood that when he goes home, he "runs off" and his grandmother doesn't know where he is. He still spoke fondly of his brother and seemed sad that he was gone. Patient is attending groups and taking most of his meds. Continue to monitor.
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
## 2014-12-01 LAB — PROLACTIN: PROLACTIN: 1.3 ng/mL — AB (ref 4.0–15.2)
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
## 2014-12-01 NOTE — BHH Group Notes (Addendum)
BHH LCSW Group Therapy  12/01/2014 5:03 PM  Type of Therapy:  Group Therapy  Participation Level:  Active  Participation Quality:  Appropriate  Affect:  Appropriate  Cognitive:  Appropriate  Insight:  Developing/Improving  Engagement in Therapy:  Improving  Modes of Intervention:  Discussion, Education, Socialization and Support  Summary of Progress/Problems: LCSW reviewed group rules with each patient. It was a calm small group and we mad our focus on things we can do to promote our wellness, happiness and relationship obstacles. Patients were asked to share and reflect on personal dating experiences and then support one another with positive affirmations and funny life quotes.   Anthony Luna M 12/01/2014, 5:03 PM

## 2014-12-01 NOTE — BHH Group Notes (Signed)
B[facility] Group Notes:  (Nursing/MHT/Case Management/Adjunct)  Date:  12/01/2014  Time:  1:27 AM  Type of Therapy:  Group Therapy  Participation Level:  Active  Participation Quality:  Sharing  Affect:  Anxious  Cognitive:  Oriented  Insight:  Appropriate  Engagement in Group:  Engaged  Modes of Intervention:  n.a  Summary of Progress/Problems:  Anthony Luna 12/01/2014, 1:27 AM

## 2014-12-01 NOTE — Progress Notes (Signed)
Recreation Therapy Notes  Date: 06.17.16 Time: 3:00 pm Location: Craft Room  Group Topic: Self-expression/coping skills  Goal Area(s) Addresses:  Patient will effectively use art as a means of self-expression. Patient recognize positive benefit for self-expression. Patient will be able to identify one emotion experienced during group session. Patient will identify use of art/self-expression as a coping skill.  Behavioral Response: Left early  Intervention: Two Faces of Me  Activity: Patients were given a blank face worksheet and instructed to draw or write how they felt when they were admitted to the hospital on one said and draw or write how they want to feel when they are d/c on the other side.  Education: LRT educated patients on different forms of self-expression.   Education Outcome: Acknowledges education/In group clarification offered  Clinical Observations/Feedback: Patient did not work on group activity. Patient left group at approximately 3:25 pm. Patient did not return to group.  Jacquelynn Cree, LRT/CTRS 12/01/2014 4:17 PM

## 2014-12-01 NOTE — Plan of Care (Signed)
Problem: Ineffective individual coping Goal: STG: Patient will remain free from self harm Outcome: Progressing Pt denies SI     

## 2014-12-01 NOTE — Plan of Care (Signed)
Problem: Alteration in mood Goal: LTG-Patient reports reduction in suicidal thoughts (Patient reports reduction in suicidal thoughts and is able to verbalize a safety plan for whenever patient is feeling suicidal)  Outcome: Progressing Patient denies suicidal ideation.     

## 2014-12-01 NOTE — BHH Group Notes (Signed)
[facility] LCSW Aftercare Discharge Planning Group Note  12/01/2014 11:06 AM  Participation Quality:  Inattentive  Affect:  Lethargic  Cognitive:  Lacking  Insight:  Developing/Improving  Engagement in Group:  Developing/Improving  Modes of Intervention:  Discussion, Education and Support  Summary of Progress/Problems:patients smart goal was to come to group, however as we were a small group patient fell asleep in his chair for twenty minutes. When he awoke he was sorry. LCSW supported patient and discussed he was sleepy from medicines and this was normal.  Tamyah Cutbirth M 12/01/2014, 11:06 AM

## 2014-12-01 NOTE — Progress Notes (Signed)
Patient was anxious & pacing in the hallway this morning.Denies suicidal or homicidal ideation and hallucination.Appropriate with staff & peers.Compliant with meds.Appetite good.

## 2014-12-01 NOTE — Progress Notes (Signed)
[facility] MD Progress Note  12/01/2014 12:23 PM Anthony Luna  MRN:  [id number] Subjective:  Patient was somewhat sedated this morning. He completed that he didn't want to take any of his medications because they were making him too tired. He reported feeling dizzy. Patient denied depressed mood, SI, HI or auditory or visual hallucinations. He denied having problems with sleep, appetite, energy or concentration. He denied having any other physical complaints.  Per nursing : Pt visible on the unit. Observed responding to internal stimuli. Pt states he is hearing voices. Anxious pacing. Ativan given x 1 effective. Attended group. Med compliant.  Principal Problem: Schizoaffective disorder, bipolar type Diagnosis:   Patient Active Problem List   Diagnosis Date Noted  . Cannabis use disorder, severe, dependence [F12.20] 11/30/2014  . Schizoaffective disorder, bipolar type [F25.0] 11/30/2014  . Tobacco use disorder [Z72.0] 11/30/2014  . Grief [F43.21] 11/28/2014   Total Time spent with patient: 30 minutes   Past Medical History:  Past Medical History  Diagnosis Date  . None to low serum cortisol response with adrenocorticotrophic hormone (ACTH) stimulation test    History reviewed. No pertinent past surgical history. Family History: History reviewed. No pertinent family history. Social History:  History  Alcohol Use No     History  Drug Use  . Yes  . Special: Marijuana    History   Social History  . Marital Status: Single    Spouse Name: N/A  . Number of Children: N/A  . Years of Education: N/A   Social History Main Topics  . Smoking status: Current Every Day Smoker -- 0.50 packs/day    Types: Cigarettes    Start date: 11/29/2002  . Smokeless tobacco: Not on file  . Alcohol Use: No  . Drug Use: Yes    Special: Marijuana  . Sexual Activity: Not on file   Other Topics Concern  . None   Social History Narrative   Additional History:    Sleep: Good  Appetite:   Good   Assessment:   Musculoskeletal: Strength & Muscle Tone: within normal limits Gait & Station: normal Patient leans: N/A   Psychiatric Specialty Exam: Physical Exam  Review of Systems  HENT: Negative.   Eyes: Negative.   Respiratory: Negative.   Cardiovascular: Negative.   Gastrointestinal: Negative.   Genitourinary: Negative.   Musculoskeletal: Negative.   Neurological: Positive for dizziness and weakness.  Endo/Heme/Allergies: Negative.   Psychiatric/Behavioral: Negative for depression, suicidal ideas and hallucinations.    Blood pressure 136/80, pulse 108, temperature 98.2 F (36.8 C), temperature source Oral, resp. rate 20, height 6\' 1"  (1.854 m), weight 87.091 kg (192 lb), SpO2 99 %.Body mass index is 25.34 kg/(m^2).  General Appearance: Well Groomed  Patent attorney::  Minimal  Speech:  Slow  Volume:  Decreased  Mood:  Irritable  Affect:  Congruent  Thought Process:  vague  Orientation:  Full (Time, Place, and Person)  Thought Content:  Hallucinations: Auditory  Suicidal Thoughts:  No  Homicidal Thoughts:  No  Memory:  Immediate;   Fair Recent;   Fair Remote;   Fair  Judgement:  Impaired  Insight:  Lacking  Psychomotor Activity:  Decreased  Concentration:  Fair  Recall:  NA  Fund of Knowledge:Fair  Language: Good  Akathisia:  No  Handed:    AIMS (if indicated):     Assets:  Psychologist, counselling Resources/Insurance  ADL's:  Intact  Cognition: WNL  Sleep:  Number of Hours: 4.75     Current Medications:  Current Facility-Administered Medications  Medication Dose Route Frequency Provider Last Rate Last Dose  . acetaminophen (TYLENOL) tablet 650 mg  650 mg Oral Q6H PRN Audery Amel, MD      . alum & mag hydroxide-simeth (MAALOX/MYLANTA) 200-200-20 MG/5ML suspension 30 mL  30 mL Oral Q4H PRN Audery Amel, MD      . benztropine (COGENTIN) tablet 1 mg  1 mg Oral BID Audery Amel, MD   1 mg at 12/01/14 0903  . fluPHENAZine (PROLIXIN) tablet  10 mg  10 mg Oral BID Jimmy Footman, MD   10 mg at 12/01/14 0903  . lamoTRIgine (LAMICTAL) tablet 200 mg  200 mg Oral QHS Jimmy Footman, MD   200 mg at 11/30/14 2111  . lithium carbonate (ESKALITH) CR tablet 450 mg  450 mg Oral Q12H Jimmy Footman, MD   450 mg at 12/01/14 0903  . LORazepam (ATIVAN) tablet 2 mg  2 mg Oral Q6H PRN Jimmy Footman, MD   2 mg at 11/30/14 2021  . magnesium hydroxide (MILK OF MAGNESIA) suspension 30 mL  30 mL Oral Daily PRN Audery Amel, MD      . nicotine (NICOTROL) 10 MG inhaler 1 continuous puffing  1 continuous puffing Inhalation PRN Jimmy Footman, MD        Lab Results:  Results for orders placed or performed during the hospital encounter of 11/29/14 (from the past 48 hour(s))  Hemoglobin A1c     Status: None   Collection Time: 11/29/14  7:49 PM  Result Value Ref Range   Hgb A1c MFr Bld 4.9 4.0 - 6.0 %  Lipid panel, fasting     Status: Abnormal   Collection Time: 11/29/14  7:49 PM  Result Value Ref Range   Cholesterol 198 0 - 200 mg/dL   Triglycerides 57 <161 mg/dL   HDL 53 >09 mg/dL   Total CHOL/HDL Ratio 3.7 RATIO   VLDL 11 0 - 40 mg/dL   LDL Cholesterol 604 (H) 0 - 99 mg/dL    Comment:        Total Cholesterol/HDL:CHD Risk Coronary Heart Disease Risk Table                     Men   Women  1/2 Average Risk   3.4   3.3  Average Risk       5.0   4.4  2 X Average Risk   9.6   7.1  3 X Average Risk  23.4   11.0        Use the calculated Patient Ratio above and the CHD Risk Table to determine the patient's CHD Risk.        ATP III CLASSIFICATION (LDL):  <100     mg/dL   Optimal  540-981  mg/dL   Near or Above                    Optimal  130-159  mg/dL   Borderline  191-478  mg/dL   High  >295     mg/dL   Very High   TSH     Status: None   Collection Time: 11/30/14  3:10 PM  Result Value Ref Range   TSH 1.339 0.350 - 4.500 uIU/mL  Prolactin     Status: Abnormal   Collection Time:  11/30/14  3:10 PM  Result Value Ref Range   Prolactin 1.3 (L) 4.0 - 15.2 ng/mL    Comment: (NOTE) Performed At:  [facility] [address] [place], [place] [id number] Mila Homer MD BJ:[id number]     Physical Findings: AIMS:  , ,  ,  ,    CIWA:    COWS:     Treatment Plan Summary: Daily contact with patient to assess and evaluate symptoms and progress in treatment and Medication management   29 year old African-American male with history of schizoaffective disorder bipolar type. Patient was admitted after voicing suicidal ideation and auditory visual hallucinations.  Group home patient has been gone from the group home for a few days and has not received any medications during that time. During the time the patient was absent from the group home he has been using cannabis. At examination patient has euphoric mood, psychomotor agitation was sexually inappropriate and disinhibited.  Last night staff reported interaction to internal stimuli.  For schizoaffective disorder: -Continue Prolixin 10 mg by mouth twice a day -I will discontinue Seroquel 300 mg by mouth twice a day as patient appears overly sedated today and complains of feeling lightheaded -Continue Lamictal 200 mg by mouth daily at bedtime -Patient has been started on lithium (6/16) lithium 450 mg by mouth twice a day  EPS prevention: Continue benztropine 1 mg by mouth twice a day  Agitation: -I will start the patient on Ativan 2 mg every 6 hours when necessary  Tobacco use disorder: Continue Nicotrol Inhaler when necessary  Metabolic syndrome: I will order hemoglobin A1c. Lipid panel was checked yesterday only showing mild elevation of LDL.  Labs: Lipid panel, TSH and prolactin level are basically within normal limits.  Precautions continue every 15 minute checks  Hospitalization status: Continue involuntary commitment  Collateral information: Patient is states he does not have a guardian. Per  admission appears that he does it is unclear at this time will have to contact group home.  Medical Decision Making:  Established Problem, Worsening (2)     Jimmy Footman 12/01/2014, 12:23 PM

## 2014-12-01 NOTE — [facility] Group Notes (Signed)
Adult Psychoeducational Group Note  Date:  12/01/2014 Time:  11:23 PM  Group Topic/Focus:  Wrap-Up Group:   The focus of this group is to help patients review their daily goal of treatment and discuss progress on daily workbooks.  Participation Level:  Minimal  Participation Quality:  Appropriate  Affect:  Appropriate  Cognitive:  Appropriate  Insight: Limited  Engagement in Group:  Limited  Modes of Intervention:  Discussion  Additional Comments:  N/A  Tomasita Morrow 12/01/2014, 11:23 PM

## 2014-12-01 NOTE — [facility] Group Notes (Signed)
[facility] Group Notes:  (Nursing/MHT/Case Management/Adjunct)  Date:  12/01/2014  Time:  11:57 AM  Type of Therapy:  Group Therapy  Participation Level:  Minimal  Participation Quality:  Appropriate  Affect:  Appropriate  Cognitive:  Disorganized and Lacking  Insight:  Lacking and Limited  Engagement in Group:  Lacking and Limited  Modes of Intervention:  n/a  Summary of Progress/Problems:  Mickey Farber 12/01/2014, 11:57 AM

## 2014-12-01 NOTE — Plan of Care (Signed)
Problem: [facility] Participation in Recreation Therapeutic Interventions Goal: STG-Other Recreation Therapy Goal (Specify) STG: Stress management - Within 7 treatment sessions, patient will demonstrate at least one stress management technique in each of 2 treatment sessions to increase stress management skills post d/c.  Outcome: Progressing Treatment Session 1; Completed 0 out of 2: At approximately 11:40 am, LRT met with patient in consultation room. LRT educated and provided patient with handouts on stress management techniques. Patient verbalized understanding. LRT encouraged patient to read over and practice the stress management techniques.  Leonette Monarch, LRT/CTRS 06.17.16 1;21 pm

## 2014-12-02 NOTE — Plan of Care (Signed)
Problem: Ineffective individual coping Goal: STG: Patient will remain free from self harm Outcome: Progressing No attempts to harm self or others. Denies si or hi.

## 2014-12-02 NOTE — Progress Notes (Signed)
Pt has been visible in the milieu during the latter part of the even and has been up since 03:50am. Pt laughs inappropriately, and has blank stare. Appears to be responding to internal stimuli. Requesting male staff come in his room to talk to him. Monitored closely with male peer. Has not demonstrated any physical aggression, but did require Ativan 2mg  last night at 2010 for agitation, which was effective. Pacing in the hallway. Laughing inappropriately. Making bizarre gestures in the nurse's station window.

## 2014-12-02 NOTE — Progress Notes (Signed)
[facility] MD Progress Note  12/02/2014 1:53 PM Anthony Luna  MRN:  [id number] Subjective:  Up-to-date as of June 18. Patient continues to pace around the ward a lot. He is able to stop and hold a conversation however. His conversation with me is quite appropriate. He says his voices are much improved. His mood is feeling stable. Denies suicidal or homicidal ideation. He says that he is feeling like the current medications seem well. He does admit to my asking that he feels like he is compelled to pace around and I'm concerned that he may be having some akathisia. Principal Problem: Schizoaffective disorder, bipolar type Diagnosis:   Patient Active Problem List   Diagnosis Date Noted  . Cannabis use disorder, severe, dependence [F12.20] 11/30/2014  . Schizoaffective disorder, bipolar type [F25.0] 11/30/2014  . Tobacco use disorder [Z72.0] 11/30/2014  . Grief [F43.21] 11/28/2014   Total Time spent with patient: 30 minutes   Past Medical History:  Past Medical History  Diagnosis Date  . None to low serum cortisol response with adrenocorticotrophic hormone (ACTH) stimulation test    History reviewed. No pertinent past surgical history. Family History: History reviewed. No pertinent family history. Social History:  History  Alcohol Use No     History  Drug Use  . Yes  . Special: Marijuana    History   Social History  . Marital Status: Single    Spouse Name: N/A  . Number of Children: N/A  . Years of Education: N/A   Social History Main Topics  . Smoking status: Current Every Day Smoker -- 0.50 packs/day    Types: Cigarettes    Start date: 11/29/2002  . Smokeless tobacco: Not on file  . Alcohol Use: No  . Drug Use: Yes    Special: Marijuana  . Sexual Activity: Not on file   Other Topics Concern  . None   Social History Narrative   Additional History:    Sleep: Good  Appetite:  Good   Assessment:   Musculoskeletal: Strength & Muscle Tone: within normal  limits Gait & Station: normal Patient leans: N/A   Psychiatric Specialty Exam: Physical Exam  Constitutional: He appears well-developed and well-nourished.  HENT:  Head: Normocephalic and atraumatic.  Eyes: Conjunctivae are normal. Pupils are equal, round, and reactive to light.  Neck: Normal range of motion.  Cardiovascular: Normal heart sounds.   Respiratory: Effort normal.  GI: Soft.  Musculoskeletal: Normal range of motion.  Neurological: He is alert.  Skin: Skin is warm and dry.  Psychiatric: He has a normal mood and affect. His speech is normal and behavior is normal. Judgment and thought content normal. Cognition and memory are normal.    Review of Systems  HENT: Negative.   Eyes: Negative.   Respiratory: Negative.   Cardiovascular: Negative.   Gastrointestinal: Negative.   Genitourinary: Negative.   Musculoskeletal: Negative.   Skin: Negative.   Neurological: Positive for dizziness and weakness.  Endo/Heme/Allergies: Negative.   Psychiatric/Behavioral: Negative for depression, suicidal ideas and hallucinations.    Blood pressure 124/78, pulse 87, temperature 98.4 F (36.9 C), temperature source Oral, resp. rate 20, height 6\' 1"  (1.854 m), weight 87.091 kg (192 lb), SpO2 99 %.Body mass index is 25.34 kg/(m^2).  General Appearance: Well Groomed  Patent attorney::  Minimal  Speech:  Slow  Volume:  Decreased  Mood:  Irritable  Affect:  Congruent  Thought Process:  vague  Orientation:  Full (Time, Place, and Person)  Thought Content:  Hallucinations: Auditory  Suicidal Thoughts:  No  Homicidal Thoughts:  No  Memory:  Immediate;   Fair Recent;   Fair Remote;   Fair  Judgement:  Impaired  Insight:  Lacking  Psychomotor Activity:  Decreased  Concentration:  Fair  Recall:  NA  Fund of Knowledge:Fair  Language: Good  Akathisia:  No  Handed:    AIMS (if indicated):     Assets:  Psychologist, counselling Resources/Insurance  ADL's:  Intact  Cognition: WNL   Sleep:  Number of Hours: 5.25     Current Medications: Current Facility-Administered Medications  Medication Dose Route Frequency Provider Last Rate Last Dose  . acetaminophen (TYLENOL) tablet 650 mg  650 mg Oral Q6H PRN Audery Amel, MD      . alum & mag hydroxide-simeth (MAALOX/MYLANTA) 200-200-20 MG/5ML suspension 30 mL  30 mL Oral Q4H PRN Audery Amel, MD      . benztropine (COGENTIN) tablet 1 mg  1 mg Oral BID Audery Amel, MD   1 mg at 12/02/14 0956  . fluPHENAZine (PROLIXIN) tablet 10 mg  10 mg Oral BID Jimmy Footman, MD   10 mg at 12/02/14 0955  . lamoTRIgine (LAMICTAL) tablet 200 mg  200 mg Oral QHS Jimmy Footman, MD   200 mg at 12/01/14 2200  . lithium carbonate (ESKALITH) CR tablet 450 mg  450 mg Oral Q12H Jimmy Footman, MD   450 mg at 12/02/14 0957  . LORazepam (ATIVAN) tablet 2 mg  2 mg Oral Q6H PRN Jimmy Footman, MD   2 mg at 12/01/14 2010  . magnesium hydroxide (MILK OF MAGNESIA) suspension 30 mL  30 mL Oral Daily PRN Audery Amel, MD      . nicotine (NICOTROL) 10 MG inhaler 1 continuous puffing  1 continuous puffing Inhalation PRN Jimmy Footman, MD        Lab Results:  Results for orders placed or performed during the hospital encounter of 11/29/14 (from the past 48 hour(s))  TSH     Status: None   Collection Time: 11/30/14  3:10 PM  Result Value Ref Range   TSH 1.339 0.350 - 4.500 uIU/mL  Prolactin     Status: Abnormal   Collection Time: 11/30/14  3:10 PM  Result Value Ref Range   Prolactin 1.3 (L) 4.0 - 15.2 ng/mL    Comment: (NOTE) Performed At: [facility] [address] [place], [place] [id number] Mila Homer MD WU:[id number]     Physical Findings: AIMS:  , ,  ,  ,    CIWA:    COWS:     Treatment Plan Summary: Daily contact with patient to assess and evaluate symptoms and progress in treatment and Medication management   29 year old African-American male with history of  schizoaffective disorder bipolar type. Patient was admitted after voicing suicidal ideation and auditory visual hallucinations.  Group home patient has been gone from the group home for a few days and has not received any medications during that time. During the time the patient was absent from the group home he has been using cannabis. At examination patient has euphoric mood, psychomotor agitation was sexually inappropriate and disinhibited.  Last night staff reported interaction to internal stimuli.  For schizoaffective disorder: -Continue Prolixin 10 mg by mouth twice a day -I will discontinue Seroquel 300 mg by mouth twice a day as patient appears overly sedated today and complains of feeling lightheaded -Continue Lamictal 200 mg by mouth daily at bedtime -Patient has been started on lithium (  6/16) lithium 450 mg by mouth twice a day  EPS prevention: Continue benztropine 1 mg by mouth twice a day  Agitation: -I will start the patient on Ativan 2 mg every 6 hours when necessary  Tobacco use disorder: Continue Nicotrol Inhaler when necessary  Metabolic syndrome: I will order hemoglobin A1c. Lipid panel was checked yesterday only showing mild elevation of LDL.  Labs: Lipid panel, TSH and prolactin level are basically within normal limits.  Precautions continue every 15 minute checks  Hospitalization status: Continue involuntary commitment  Collateral information: Patient is states he does not have a guardian. Per admission appears that he does it is unclear at this time will have to contact group home. Patient appears to be improving quite a bit. I am a little concerned about the possibility of akathisia with his current dose of Prolixin. I will not change medicine today but that may be something to address short or long term. It's not really bothering him too much. I don't see any signs of tardive dyskinesia. Patient is showing better insight about substance abuse. The rest of his  physical is normal. Patient may be getting pretty close to baseline. Supportive counseling completed  Medical Decision Making:  Established Problem, Stable/Improving (1), Review of Psycho-Social Stressors (1), Review or order clinical lab tests (1), Review of Medication Regimen & Side Effects (2) and Review of New Medication or Change in Dosage (2)     John [name] 12/02/2014, 1:53 PM

## 2014-12-03 NOTE — Progress Notes (Signed)
[facility] MD Progress Note  12/03/2014 12:40 PM Anthony Luna  MRN:  [id number] Subjective:  Follow-up today for patient with schizophrenia. Today he was in bed lying down. Not pacing around the unit. Has no new complaints. Denies hallucinations. Denies suicidal ideation. Does not seem to be suffering from akathisia today. No reports of any behavior problems. Patient did look a little bit more withdrawn and less interactive today. Continues to tolerate medication well. Principal Problem: Schizoaffective disorder, bipolar type Diagnosis:   Patient Active Problem List   Diagnosis Date Noted  . Cannabis use disorder, severe, dependence [F12.20] 11/30/2014  . Schizoaffective disorder, bipolar type [F25.0] 11/30/2014  . Tobacco use disorder [Z72.0] 11/30/2014  . Grief [F43.21] 11/28/2014   Total Time spent with patient: 30 minutes   Past Medical History:  Past Medical History  Diagnosis Date  . None to low serum cortisol response with adrenocorticotrophic hormone (ACTH) stimulation test    History reviewed. No pertinent past surgical history. Family History: History reviewed. No pertinent family history. Social History:  History  Alcohol Use No     History  Drug Use  . Yes  . Special: Marijuana    History   Social History  . Marital Status: Single    Spouse Name: N/A  . Number of Children: N/A  . Years of Education: N/A   Social History Main Topics  . Smoking status: Current Every Day Smoker -- 0.50 packs/day    Types: Cigarettes    Start date: 11/29/2002  . Smokeless tobacco: Not on file  . Alcohol Use: No  . Drug Use: Yes    Special: Marijuana  . Sexual Activity: Not on file   Other Topics Concern  . None   Social History Narrative   Additional History:    Sleep: Good  Appetite:  Good   Assessment:   Musculoskeletal: Strength & Muscle Tone: within normal limits Gait & Station: normal Patient leans: N/A   Psychiatric Specialty Exam: Physical Exam   Constitutional: He appears well-developed and well-nourished.  HENT:  Head: Normocephalic and atraumatic.  Eyes: Conjunctivae are normal. Pupils are equal, round, and reactive to light.  Neck: Normal range of motion.  Cardiovascular: Normal heart sounds.   Respiratory: Effort normal.  GI: Soft.  Musculoskeletal: Normal range of motion.  Neurological: He is alert.  Skin: Skin is warm and dry.  Psychiatric: He has a normal mood and affect. His speech is normal and behavior is normal. Judgment and thought content normal. Cognition and memory are normal.    Review of Systems  Constitutional: Negative.   HENT: Negative.   Eyes: Negative.   Respiratory: Negative.   Cardiovascular: Negative.   Gastrointestinal: Negative.   Genitourinary: Negative.   Musculoskeletal: Negative.   Skin: Negative.   Neurological: Negative.  Negative for dizziness and weakness.  Endo/Heme/Allergies: Negative.   Psychiatric/Behavioral: Negative for depression, suicidal ideas and hallucinations.    Blood pressure 122/81, pulse 88, temperature 98.2 F (36.8 C), temperature source Oral, resp. rate 20, height  (1.854 m), weight 87.091 kg (192 lb), SpO2 99 %.Body mass index is 25.34 kg/(m^2).  General Appearance: Well Groomed  Patent attorney::  Minimal  Speech:  Slow  Volume:  Decreased  Mood:  Irritable  Affect:  Congruent  Thought Process:  vague  Orientation:  Full (Time, Place, and Person)  Thought Content:  Negative  Suicidal Thoughts:  No  Homicidal Thoughts:  No  Memory:  Immediate;   Fair Recent;   Fair Remote;  Fair  Judgement:  Impaired  Insight:  Lacking  Psychomotor Activity:  Decreased  Concentration:  Fair  Recall:  NA  Fund of Knowledge:Fair  Language: Good  Akathisia:  No  Handed:    AIMS (if indicated):     Assets:  Psychologist, counselling Resources/Insurance  ADL's:  Intact  Cognition: WNL  Sleep:  Number of Hours: 5.25     Current Medications: Current  Facility-Administered Medications  Medication Dose Route Frequency Provider Last Rate Last Dose  . acetaminophen (TYLENOL) tablet 650 mg  650 mg Oral Q6H PRN Audery Amel, MD      . alum & mag hydroxide-simeth (MAALOX/MYLANTA) 200-200-20 MG/5ML suspension 30 mL  30 mL Oral Q4H PRN Audery Amel, MD      . benztropine (COGENTIN) tablet 1 mg  1 mg Oral BID Audery Amel, MD   1 mg at 12/03/14 1017  . fluPHENAZine (PROLIXIN) tablet 10 mg  10 mg Oral BID Jimmy Footman, MD   10 mg at 12/03/14 1017  . lamoTRIgine (LAMICTAL) tablet 200 mg  200 mg Oral QHS Jimmy Footman, MD   200 mg at 12/02/14 2101  . lithium carbonate (ESKALITH) CR tablet 450 mg  450 mg Oral Q12H Jimmy Footman, MD   450 mg at 12/03/14 1017  . LORazepam (ATIVAN) tablet 2 mg  2 mg Oral Q6H PRN Jimmy Footman, MD   2 mg at 12/03/14 0105  . magnesium hydroxide (MILK OF MAGNESIA) suspension 30 mL  30 mL Oral Daily PRN Audery Amel, MD      . nicotine (NICOTROL) 10 MG inhaler 1 continuous puffing  1 continuous puffing Inhalation PRN Jimmy Footman, MD        Lab Results:  No results found for this or any previous visit (from the past 48 hour(s)).  Physical Findings: AIMS:  , ,  ,  ,    CIWA:    COWS:     Treatment Plan Summary: Daily contact with patient to assess and evaluate symptoms and progress in treatment and Medication management   29 year old African-American male with history of schizoaffective disorder bipolar type. Patient was admitted after voicing suicidal ideation and auditory visual hallucinations.  Group home patient has been gone from the group home for a few days and has not received any medications during that time. During the time the patient was absent from the group home he has been using cannabis. At examination patient has euphoric mood, psychomotor agitation was sexually inappropriate and disinhibited.  Last night staff reported interaction to  internal stimuli.  For schizoaffective disorder: -Continue Prolixin 10 mg by mouth twice a day -I will discontinue Seroquel 300 mg by mouth twice a day as patient appears overly sedated today and complains of feeling lightheaded -Continue Lamictal 200 mg by mouth daily at bedtime -Patient has been started on lithium (6/16) lithium 450 mg by mouth twice a day  EPS prevention: Continue benztropine 1 mg by mouth twice a day  Agitation: -I will start the patient on Ativan 2 mg every 6 hours when necessary  Tobacco use disorder: Continue Nicotrol Inhaler when necessary  Metabolic syndrome: I will order hemoglobin A1c. Lipid panel was checked yesterday only showing mild elevation of LDL.  Labs: Lipid panel, TSH and prolactin level are basically within normal limits.  Precautions continue every 15 minute checks  Hospitalization status: Continue involuntary commitment  Collateral information: Patient is states he does not have a guardian. Per admission appears that he does  it is unclear at this time will have to contact group home. Patient is denying psychotic symptoms today although he is lying in bed with his eyes closed. Speaking less. Possibly may have her return of psychotic symptoms that he is not willing to discuss. Doesn't appear to be having akathisia. Plan will be for recheck of lithium level tomorrow. Supportive counseling. Encouraged patient to interact with social work to see if he can go back to his group home. Medical Decision Making:  Established Problem, Stable/Improving (1), Review of Psycho-Social Stressors (1), Review or order clinical lab tests (1), Review of Medication Regimen & Side Effects (2) and Review of New Medication or Change in Dosage (2)     John [name] 12/03/2014, 12:40 PM

## 2014-12-03 NOTE — Progress Notes (Signed)
D) Patient pleasant and cooperative upon my assessment. Patient did not complete Self Inventory Assessment and rates depression as 5 /10, patient rates hopeless feelings as 7 /10.  Patient denies SI/HI, denies visual hallucinations, but he endorses auditory hallucinations.  Patient cannot identify the voices that he hears.  Patient's affect is flat and mood is appropriate  Reports that her appetite is good.   A) Patient offered support and encouragement, patient encouraged to discuss feelings/concerns with staff. Patient verbalized understanding. Patient monitored Q15 minutes for safety. Patient met with MD  to discuss today's goals and plan of care.  R) Patient visible in milieu, has or has not attended groups today.  Patient come to dining room for meals and snacks. Patient appropriate with staff and peers.   Patient taking medications as ordered. Will continue to monitor.

## 2014-12-03 NOTE — [facility] Group Notes (Signed)
[facility] Group Notes:  (Nursing/MHT/Case Management/Adjunct)  Date:  12/03/2014  Time:  9:22 AM  Type of Therapy:  Goals   Participation Level:  Active  Participation Quality:  Appropriate  Affect:  Appropriate  Cognitive:  Appropriate  Insight:  Appropriate  Engagement in Group:  Supportive  Modes of Intervention:  Goal Setting  Summary of Progress/Problems:  Marquette Old 12/03/2014, 9:22 AM

## 2014-12-03 NOTE — Progress Notes (Signed)
Was in TV interacting with others at onset of shift. Was pleasant and denied A,V,H and SI, HI. Began pacing the halls around 2400 and stated he was anxious. Given PO Ativan at 0100. Retreated to room for sleep for remainder of night.

## 2014-12-03 NOTE — Progress Notes (Signed)
Pt visible on the unit. Quiet. Little interaction noted with peers. Pt states he feels better but still hears voices. Says they are getting less . Med compliant.

## 2014-12-03 NOTE — [facility] Suicide Risk Assessment (Signed)
[facility] INPATIENT:  Family/Significant Other Suicide Prevention Education  Suicide Prevention Education:  Education Completed; Candie Echevaria (Group Home Supervisor) [phone] has been identified by the patient as the family member/significant other with whom the patient will be residing, and identified as the person(s) who will aid the patient in the event of a mental health crisis (suicidal ideations/suicide attempt).  With written consent from the patient, the family member/significant other has been provided the following suicide prevention education, prior to the and/or following the discharge of the patient.  The suicide prevention education provided includes the following:  Suicide risk factors  Suicide prevention and interventions  National Suicide Hotline telephone number  [facility] assessment telephone number  [facility] Emergency Assistance 911  [facility] and/or Residential Mobile Crisis Unit telephone number  Request made of family/significant other to:  Remove weapons (e.g., guns, rifles, knives), all items previously/currently identified as safety concern.    Remove drugs/medications (over-the-counter, prescriptions, illicit drugs), all items previously/currently identified as a safety concern.  The family member/significant other verbalizes understanding of the suicide prevention education information provided.  The family member/significant other agrees to remove the items of safety concern listed above.   Rondall Allegra, MSW, LCSWA  12/03/2014, 11:27 AM

## 2014-12-03 NOTE — [facility] Group Notes (Signed)
[facility] LCSW Group Therapy  12/03/2014 2:55 PM  Type of Therapy:  Group Therapy  Participation Level:  None  Participation Quality:  Attentive  Affect:  Flat  Cognitive:  Alert  Insight:  Limited  Engagement in Therapy:  Limited  Modes of Intervention:  Discussion, Education, Problem-solving, Socialization and Support  Summary of Progress/Problems:Balance in life: Patients will discuss the concept of balance and how it looks and feels to be unbalanced. Pt will identify areas in their life that is unbalanced and ways to become more balanced. Karrington attended group and stayed the entire time. He stood quietly and listened to other group members.    Paradise Vensel L Loda Bialas 12/03/2014, 2:55 PM

## 2014-12-03 NOTE — Plan of Care (Signed)
Problem: Alteration in mood Goal: LTG-Pt's behavior demonstrates decreased signs of depression (Patient's behavior demonstrates decreased signs of depression to the point the patient is safe to return home and continue treatment in an outpatient setting)  Outcome: Progressing Pt states depression is better.

## 2014-12-04 LAB — LITHIUM LEVEL: Lithium Lvl: 0.65 mmol/L (ref 0.60–1.20)

## 2014-12-04 MED ORDER — FLUPHENAZINE HCL 5 MG PO TABS
5.0000 mg | ORAL_TABLET | Freq: Every day | ORAL | Status: DC
  Administered 2014-12-04 – 2014-12-11 (×8): 5 mg via ORAL
  Filled 2014-12-04 (×8): qty 1

## 2014-12-04 MED ORDER — LITHIUM CARBONATE ER 300 MG PO TBCR
600.0000 mg | EXTENDED_RELEASE_TABLET | Freq: Two times a day (BID) | ORAL | Status: DC
  Administered 2014-12-04 – 2014-12-09 (×11): 600 mg via ORAL
  Filled 2014-12-04 (×13): qty 2

## 2014-12-04 MED ORDER — FLUPHENAZINE HCL 5 MG PO TABS
10.0000 mg | ORAL_TABLET | Freq: Every day | ORAL | Status: DC
  Administered 2014-12-05 – 2014-12-10 (×6): 10 mg via ORAL
  Filled 2014-12-04 (×7): qty 2

## 2014-12-04 MED ORDER — DIPHENHYDRAMINE HCL 25 MG PO CAPS
50.0000 mg | ORAL_CAPSULE | Freq: Every day | ORAL | Status: DC
  Administered 2014-12-04 – 2014-12-06 (×3): 50 mg via ORAL
  Filled 2014-12-04 (×4): qty 2

## 2014-12-04 NOTE — Progress Notes (Signed)
Patient was calm & cooperative.Denies suicidal or homicidal ideation.Positive for auditory hallucination.Stated that it is less intense than before.Appropriate with staff with staff & peers.Compliant with meds.Attendeing groups.

## 2014-12-04 NOTE — Plan of Care (Signed)
Problem: [facility] Participation in Recreation Therapeutic Interventions Goal: STG-Other Recreation Therapy Goal (Specify) STG: Stress management - Within 7 treatment sessions, patient will demonstrate at least one stress management technique in each of 2 treatment sessions to increase stress management skills post d/c.  Outcome: Progressing Treatment Session 2; Completed 0 out of 2: At approximately 12:35 pm, LRT met with patient in craft room. Patient reported he read over the stress management techniques, but had not practiced them. LRT encouraged patient to practice the stress management techniques.   M , LRT/CTRS 06.20.16 4:50 pm     

## 2014-12-04 NOTE — Progress Notes (Signed)
[facility] MD Progress Note  12/04/2014 4:03 PM Anthony Luna  MRN:  [id number] Subjective:  Patient was calm and cooperative this morning. He continues to report having auditory hallucinations who tell him messages that are sometimes positive and sometimes negative. He was unable to elaborate or give me example to show what is the negative message.  He denies suicidality, homicidality or visual hallucinations. He denies major problems with his sleep, appetite or energy. He complains about having poor concentration due to hearing voices. He also complained about feeling restless. He denied any other physical complaints or side effects from medications.  Per nursing : Pt visible on the unit. Quiet. Little interaction noted with peers. Pt states he feels better but still hears voices. Says they are getting less . Med compliant.   Principal Problem: Schizoaffective disorder, bipolar type Diagnosis:   Patient Active Problem List   Diagnosis Date Noted  . Cannabis use disorder, severe, dependence [F12.20] 11/30/2014  . Schizoaffective disorder, bipolar type [F25.0] 11/30/2014  . Tobacco use disorder [Z72.0] 11/30/2014  . Grief [F43.21] 11/28/2014   Total Time spent with patient: 30 minutes   Past Medical History:  Past Medical History  Diagnosis Date  . None to low serum cortisol response with adrenocorticotrophic hormone (ACTH) stimulation test    History reviewed. No pertinent past surgical history. Family History: History reviewed. No pertinent family history. Social History:  History  Alcohol Use No     History  Drug Use  . Yes  . Special: Marijuana    History   Social History  . Marital Status: Single    Spouse Name: N/A  . Number of Children: N/A  . Years of Education: N/A   Social History Main Topics  . Smoking status: Current Every Day Smoker -- 0.50 packs/day    Types: Cigarettes    Start date: 11/29/2002  . Smokeless tobacco: Not on file  . Alcohol Use: No  .  Drug Use: Yes    Special: Marijuana  . Sexual Activity: Not on file   Other Topics Concern  . None   Social History Narrative   Additional History:    Sleep: Good  Appetite:  Good   Assessment:   Musculoskeletal: Strength & Muscle Tone: within normal limits Gait & Station: normal Patient leans: N/A   Psychiatric Specialty Exam: Physical Exam   Review of Systems  HENT: Negative.   Eyes: Negative.   Respiratory: Negative.   Cardiovascular: Negative.   Gastrointestinal: Negative.   Genitourinary: Negative.   Musculoskeletal: Negative.   Endo/Heme/Allergies: Negative.   Psychiatric/Behavioral: Positive for hallucinations. Negative for depression and suicidal ideas.    Blood pressure 110/66, pulse 80, temperature 98.5 F (36.9 C), temperature source Oral, resp. rate 20, height  (1.854 m), weight 87.091 kg (192 lb), SpO2 99 %.Body mass index is 25.34 kg/(m^2).  General Appearance: Well Groomed  Patent attorney::  Good  Speech:  Normal Rate  Volume:  Decreased  Mood:  Euthymic  Affect:  Congruent  Thought Process:  vague  Orientation:  Full (Time, Place, and Person)  Thought Content:  Hallucinations: Auditory  Suicidal Thoughts:  No  Homicidal Thoughts:  No  Memory:  Immediate;   Fair Recent;   Fair Remote;   Fair  Judgement:  Impaired  Insight:  Lacking  Psychomotor Activity:  Decreased  Concentration:  Fair  Recall:  NA  Fund of Knowledge:Fair  Language: Good  Akathisia:  No  Handed:    AIMS (if indicated):  Assets:  Architect  ADL's:  Intact  Cognition: WNL  Sleep:  Number of Hours: 7.25     Current Medications: Current Facility-Administered Medications  Medication Dose Route Frequency Provider Last Rate Last Dose  . acetaminophen (TYLENOL) tablet 650 mg  650 mg Oral Q6H PRN Audery Amel, MD   650 mg at 12/04/14 0056  . alum & mag hydroxide-simeth (MAALOX/MYLANTA) 200-200-20 MG/5ML suspension 30 mL   30 mL Oral Q4H PRN Audery Amel, MD      . diphenhydrAMINE (BENADRYL) capsule 50 mg  50 mg Oral QHS Jimmy Footman, MD      . Melene Muller ON 12/05/2014] fluPHENAZine (PROLIXIN) tablet 10 mg  10 mg Oral QHS Jimmy Footman, MD      . fluPHENAZine (PROLIXIN) tablet 5 mg  5 mg Oral Daily Jimmy Footman, MD   5 mg at 12/04/14 1354  . lamoTRIgine (LAMICTAL) tablet 200 mg  200 mg Oral QHS Jimmy Footman, MD   200 mg at 12/03/14 2131  . lithium carbonate (LITHOBID) CR tablet 600 mg  600 mg Oral Q12H Jimmy Footman, MD      . LORazepam (ATIVAN) tablet 2 mg  2 mg Oral Q6H PRN Jimmy Footman, MD   2 mg at 12/03/14 0105  . magnesium hydroxide (MILK OF MAGNESIA) suspension 30 mL  30 mL Oral Daily PRN Audery Amel, MD      . nicotine (NICOTROL) 10 MG inhaler 1 continuous puffing  1 continuous puffing Inhalation PRN Jimmy Footman, MD        Lab Results:  Results for orders placed or performed during the hospital encounter of 11/29/14 (from the past 48 hour(s))  Lithium level     Status: None   Collection Time: 12/04/14  6:14 AM  Result Value Ref Range   Lithium Lvl 0.65 0.60 - 1.20 mmol/L    Physical Findings: AIMS:  , ,  ,  ,    CIWA:    COWS:     Treatment Plan Summary: Daily contact with patient to assess and evaluate symptoms and progress in treatment and Medication management   29 year old African-American male with history of schizoaffective disorder bipolar type. Patient was admitted after voicing suicidal ideation and auditory visual hallucinations.  Group home patient has been gone from the group home for a few days and has not received any medications during that time. During the time the patient was absent from the group home he has been using cannabis. At examination patient has euphoric mood, psychomotor agitation was sexually inappropriate and disinhibited.   For schizoaffective disorder: -Continue Prolixin  but due to possible akathisia I will decrease the dose to 5 mg by mouth every morning and 10 mg by mouth daily at bedtime. -Seroquel has been discontinued due to over sedation -Continue Lamictal 200 mg by mouth daily at bedtime -Patient has been started on lithium (6/16) lithium 450 mg by mouth twice a day.  Lithium level today was 0.65.  I will increase the lithium to 600 mg by mouth twice a day.  EPS prevention: To simplify the regimen I will change today benztropine to Benadryl 50 mg daily at bedtime  Agitation: -I will start the patient on Ativan 2 mg every 6 hours when necessary  Tobacco use disorder: Continue Nicotrol Inhaler when necessary  Metabolic syndrome: I will order hemoglobin A1c. Lipid panel was checked yesterday only showing mild elevation of LDL.  Labs: Lipid panel, TSH and prolactin level are basically within normal  limits.  Precautions continue every 15 minute checks  Hospitalization status: Continue involuntary commitment  Collateral information: Patient does have a guardian from [employer] [facility].  Medical Decision Making:  Established Problem, Stable/Improving (1)     Jimmy Footman 12/04/2014, 4:03 PM

## 2014-12-04 NOTE — Progress Notes (Signed)
Recreation Therapy Notes  Date: 06.20.16 Time: 3:00 pm Location: Craft Room  Group Topic: Self-expression  Goal Area(s) Addresses:  Patient will identify one color per emotion listed on wheel. Patient will verbalize benefit of using art as a means of self-expression. Patient will verbalize one emotion experienced during session. Patient will be educated on other forms of self-expression.  Behavioral Response: Arrived late  Intervention: Emotion Wheel  Activity: Patients were given a worksheet with 7 different emotions and were instructed to pick a color for each emotion.   Education: LRT educated patient on different forms of self-expression.   Education Outcome: Acknowledges education/In group clarification offered   Clinical Observations/Feedback: Patient arrived to group at approximately 3:40 pm. Patient did not complete activity. Patient did not contribute to group discussion.  Jacquelynn Cree, LRT/CTRS 12/04/2014 4:07 PM

## 2014-12-04 NOTE — Plan of Care (Signed)
Problem: Alteration in mood Goal: STG-Patient reports thoughts of self-harm to staff Outcome: Progressing Patient denies suicidal ideation.     

## 2014-12-05 MED ORDER — FLUPHENAZINE DECANOATE 25 MG/ML IJ SOLN
25.0000 mg | INTRAMUSCULAR | Status: DC
  Administered 2014-12-05: 25 mg via INTRAMUSCULAR
  Filled 2014-12-05: qty 1

## 2014-12-05 NOTE — Progress Notes (Signed)
D: Pt is awake and active in the milieu this evening. Pt mood is appropriate and his affect is anxious/sad. Pt denies SI/HI and AVH at this time.   A: Writer provided medication education and encouraged pt to participate in unit activities.  R: Pt is attending groups and is pleasant and cooperative with staff.

## 2014-12-05 NOTE — Plan of Care (Signed)
Problem: [facility] Participation in Recreation Therapeutic Interventions Goal: STG-Other Recreation Therapy Goal (Specify) STG: Stress management - Within 7 treatment sessions, patient will demonstrate at least one stress management technique in each of 2 treatment sessions to increase stress management skills post d/c.  Outcome: Progressing Treatment Session 3; Completed 0 out of 2: At approximately 9:40 am, LRT met with patient in hallway. Patient reported he kind of practiced the stress management techniques. Patient was unable to demonstrate the techniques independently. LRT encouraged patient to continue practicing the stress management techniques.  Leonette Monarch, LRT/CTRS 06.21.16 1:27 pm

## 2014-12-05 NOTE — Progress Notes (Signed)
Patient states he is still a little depressed but is feeling better. He reports mild AH. Affect is blunted and mood is anxious. Reports intermittent SI. Contracts for safety. Denies HI. Continue to monitor.l

## 2014-12-05 NOTE — [facility] Group Notes (Signed)
[facility] Group Notes:  (Nursing/MHT/Case Management/Adjunct)  Date:  12/05/2014  Time:  3:18 PM  Type of Therapy:  Psychoeducational Skills  Participation Level:  None  Participation Quality:  Drowsy  Affect:  Flat  Cognitive:  Lacking  Insight:  Improving  Engagement in Group:  None  Modes of Intervention:  Support  Summary of Progress/Problems:  Marquette Old 12/05/2014, 3:18 PM

## 2014-12-05 NOTE — Progress Notes (Signed)
Recreation Therapy Notes  Date: 06.21.16 Time: 3:00 pm Location: Craft Room  Group Topic: Goal Setting   Goal Area(s) Addresses:  Patient will be able to identify one goal. Patient will verbalize benefit of setting goals. Patient will be able to identify at least one positive statement.  Behavioral Response: Attentive  Intervention: Step By Step  Activity: Patients were given a worksheet with a foot on it. Patients were instructed to write a goal on the inside of the foot and to write positive statements/advice on the outside of the foot.  Education: LRT educated patients on healthy ways to celebrate achieving their goals.   Education Outcome: Acknowledges education/In group clarification offered  Clinical Observations/Feedback: Patient completed activity by listing a goal and some positive words. Patient did not contribute to group discussion.  Jacquelynn Cree, LRT/CTRS 12/05/2014 4:14 PM

## 2014-12-05 NOTE — Progress Notes (Signed)
[facility] MD Progress Note  12/05/2014 4:24 PM Anthony Luna  MRN:  [id number] Subjective:  Patient reports feeling better today. He denies SI, HI or auditory or visual hallucinations. He denies depressed mood, or problems with appetite energy or concentration. He denies side effects from medications. He denies having any physical complaints today. Per nursing he has been calm and compliant with medications. Yesterday patient reported having auditory hallucinations but denies having hallucinations today.  Principal Problem: Schizoaffective disorder, bipolar type Diagnosis:   Patient Active Problem List   Diagnosis Date Noted  . Cannabis use disorder, severe, dependence [F12.20] 11/30/2014  . Schizoaffective disorder, bipolar type [F25.0] 11/30/2014  . Tobacco use disorder [Z72.0] 11/30/2014  . Grief [F43.21] 11/28/2014   Total Time spent with patient: 30 minutes   Past Medical History:  Past Medical History  Diagnosis Date  . None to low serum cortisol response with adrenocorticotrophic hormone (ACTH) stimulation test    History reviewed. No pertinent past surgical history. Family History: History reviewed. No pertinent family history. Social History:  History  Alcohol Use No     History  Drug Use  . Yes  . Special: Marijuana    History   Social History  . Marital Status: Single    Spouse Name: N/A  . Number of Children: N/A  . Years of Education: N/A   Social History Main Topics  . Smoking status: Current Every Day Smoker -- 0.50 packs/day    Types: Cigarettes    Start date: 11/29/2002  . Smokeless tobacco: Not on file  . Alcohol Use: No  . Drug Use: Yes    Special: Marijuana  . Sexual Activity: Not on file   Other Topics Concern  . None   Social History Narrative   Additional History:    Sleep: Good  Appetite:  Good   Assessment:   Musculoskeletal: Strength & Muscle Tone: within normal limits Gait & Station: normal Patient leans:  N/A   Psychiatric Specialty Exam: Physical Exam   Review of Systems  HENT: Negative.   Eyes: Negative.   Respiratory: Negative.   Cardiovascular: Negative.   Gastrointestinal: Negative.   Genitourinary: Negative.   Musculoskeletal: Negative.   Endo/Heme/Allergies: Negative.   Psychiatric/Behavioral: Positive for hallucinations. Negative for depression and suicidal ideas.    Blood pressure 110/79, pulse 85, temperature 98.9 F (37.2 C), temperature source Oral, resp. rate 20, height 6\' 1"  (1.854 m), weight 87.091 kg (192 lb), SpO2 99 %.Body mass index is 25.34 kg/(m^2).  General Appearance: Well Groomed  Patent attorney::  Good  Speech:  Normal Rate  Volume:  Decreased  Mood:  Euthymic  Affect:  Congruent  Thought Process:  vague  Orientation:  Full (Time, Place, and Person)  Thought Content:  Hallucinations: None  Suicidal Thoughts:  No  Homicidal Thoughts:  No  Memory:  Immediate;   Fair Recent;   Fair Remote;   Fair  Judgement:  Impaired  Insight:  Lacking  Psychomotor Activity:  Decreased  Concentration:  Fair  Recall:  NA  Fund of Knowledge:Fair  Language: Good  Akathisia:  No  Handed:    AIMS (if indicated):     Assets:  Psychologist, counselling Resources/Insurance  ADL's:  Intact  Cognition: WNL  Sleep:  Number of Hours: 5.5     Current Medications: Current Facility-Administered Medications  Medication Dose Route Frequency Provider Last Rate Last Dose  . acetaminophen (TYLENOL) tablet 650 mg  650 mg Oral Q6H PRN Audery Amel, MD  650 mg at 12/05/14 0435  . alum & mag hydroxide-simeth (MAALOX/MYLANTA) 200-200-20 MG/5ML suspension 30 mL  30 mL Oral Q4H PRN Audery Amel, MD      . diphenhydrAMINE (BENADRYL) capsule 50 mg  50 mg Oral QHS Jimmy Footman, MD   50 mg at 12/04/14 2148  . fluPHENAZine (PROLIXIN) tablet 10 mg  10 mg Oral QHS Jimmy Footman, MD      . fluPHENAZine (PROLIXIN) tablet 5 mg  5 mg Oral Daily Jimmy Footman, MD   5 mg at 12/05/14 0939  . lamoTRIgine (LAMICTAL) tablet 200 mg  200 mg Oral QHS Jimmy Footman, MD   200 mg at 12/04/14 2148  . lithium carbonate (LITHOBID) CR tablet 600 mg  600 mg Oral Q12H Jimmy Footman, MD   600 mg at 12/05/14 0939  . LORazepam (ATIVAN) tablet 2 mg  2 mg Oral Q6H PRN Jimmy Footman, MD   2 mg at 12/03/14 0105  . magnesium hydroxide (MILK OF MAGNESIA) suspension 30 mL  30 mL Oral Daily PRN Audery Amel, MD      . nicotine (NICOTROL) 10 MG inhaler 1 continuous puffing  1 continuous puffing Inhalation PRN Jimmy Footman, MD        Lab Results:  Results for orders placed or performed during the hospital encounter of 11/29/14 (from the past 48 hour(s))  Lithium level     Status: None   Collection Time: 12/04/14  6:14 AM  Result Value Ref Range   Lithium Lvl 0.65 0.60 - 1.20 mmol/L    Physical Findings: AIMS:  , ,  ,  ,    CIWA:    COWS:     Treatment Plan Summary: Daily contact with patient to assess and evaluate symptoms and progress in treatment and Medication management   29 year old African-American male with history of schizoaffective disorder bipolar type. Patient was admitted after voicing suicidal ideation and auditory visual hallucinations.  Group home patient has been gone from the group home for a few days and has not received any medications during that time. During the time the patient was absent from the group home he has been using cannabis. At examination patient has euphoric mood, psychomotor agitation was sexually inappropriate and disinhibited.   For schizoaffective disorder: -Continue Prolixin but due to possible akathisia  The dose has been decreased to 5 mg by mouth every morning and 10 mg by mouth daily at bedtime.  Will order prolixin dec 25 mg today. -Seroquel has been discontinued due to over sedation -Continue Lamictal 200 mg by mouth daily at bedtime -Patient has been  started on lithium (6/16) lithium 450 mg by mouth twice a day.  Lithium level on 6/20 was 0.65.  Lithium has been increased to 600 mg po bid.  Lithium level will be check on Sunday  EPS prevention: To simplify the regimen I will change today benztropine to Benadryl 50 mg daily at bedtime  Agitation: -I will start the patient on Ativan 2 mg every 6 hours when necessary  Tobacco use disorder: Continue Nicotrol Inhaler when necessary  Metabolic syndrome: I will order hemoglobin A1c. Lipid panel was checked yesterday only showing mild elevation of LDL.  Labs: Lipid panel, TSH and prolactin level are basically within normal limits.  Precautions continue every 15 minute checks  Hospitalization status: Continue involuntary commitment  Collateral information: Child psychotherapist contacted group home today and patient's ex-ACT team.  They confirm patient does not have a guardian. Patient has not received notice  to leave group home and is able to return.    Discharge dispo: Plan to discharge on Monday after checking lithium level.  Medical Decision Making:  Established Problem, Stable/Improving (1)     Jimmy Footman 12/05/2014, 4:24 PM

## 2014-12-05 NOTE — Plan of Care (Signed)
Problem: Alteration in mood Goal: LTG-Pt's behavior demonstrates decreased signs of depression (Patient's behavior demonstrates decreased signs of depression to the point the patient is safe to return home and continue treatment in an outpatient setting)  Outcome: Progressing States mood is improving; not as depressed.

## 2014-12-05 NOTE — [facility] Group Notes (Signed)
[facility] Group Notes:  (Nursing/MHT/Case Management/Adjunct)  Date:  12/05/2014  Time:  10:46 PM  Type of Therapy:  Group Therapy  Participation Level:  Did Not Attend    Anthony Luna 12/05/2014, 10:46 PM

## 2014-12-06 MED ORDER — AMANTADINE HCL 100 MG PO CAPS
100.0000 mg | ORAL_CAPSULE | Freq: Two times a day (BID) | ORAL | Status: DC
  Administered 2014-12-06 – 2014-12-11 (×10): 100 mg via ORAL
  Filled 2014-12-06 (×11): qty 1

## 2014-12-06 NOTE — Progress Notes (Signed)
Patient refused to eat dinner.Stated that he is not hungry & will eat snacks at bed time.

## 2014-12-06 NOTE — Progress Notes (Signed)
[facility] MD Progress Note  12/06/2014 5:38 PM Anthony Luna  MRN:  [id number] Subjective:  Patient reports feeling well today. He denies SI, HI or auditory or visual hallucinations. He denies depressed mood, or problems with appetite energy or concentration. He denies side effects from medications. He denies having any physical complaints today. Per nursing he has been calm and compliant with medications. Patient has been denying hallucinations for the last 2 days. Yesterday he received Prolixin Decanoate 25 mg IM.  Principal Problem: Schizoaffective disorder, bipolar type Diagnosis:   Patient Active Problem List   Diagnosis Date Noted  . Cannabis use disorder, severe, dependence [F12.20] 11/30/2014  . Schizoaffective disorder, bipolar type [F25.0] 11/30/2014  . Tobacco use disorder [Z72.0] 11/30/2014  . Grief [F43.21] 11/28/2014   Total Time spent with patient: 30 minutes   Past Medical History:  Past Medical History  Diagnosis Date  . None to low serum cortisol response with adrenocorticotrophic hormone (ACTH) stimulation test    History reviewed. No pertinent past surgical history. Family History: History reviewed. No pertinent family history. Social History:  History  Alcohol Use No     History  Drug Use  . Yes  . Special: Marijuana    History   Social History  . Marital Status: Single    Spouse Name: N/A  . Number of Children: N/A  . Years of Education: N/A   Social History Main Topics  . Smoking status: Current Every Day Smoker -- 0.50 packs/day    Types: Cigarettes    Start date: 11/29/2002  . Smokeless tobacco: Not on file  . Alcohol Use: No  . Drug Use: Yes    Special: Marijuana  . Sexual Activity: Not on file   Other Topics Concern  . None   Social History Narrative   Additional History:    Sleep: Good  Appetite:  Good   Assessment:   Musculoskeletal: Strength & Muscle Tone: within normal limits Gait & Station: normal Patient leans:  N/A   Psychiatric Specialty Exam: Physical Exam   Review of Systems  HENT: Negative.   Eyes: Negative.   Respiratory: Negative.   Cardiovascular: Negative.   Gastrointestinal: Negative.   Genitourinary: Negative.   Musculoskeletal: Negative.   Endo/Heme/Allergies: Negative.   Psychiatric/Behavioral: Positive for hallucinations. Negative for depression and suicidal ideas.    Blood pressure 126/80, pulse 85, temperature 98.6 F (37 C), temperature source Oral, resp. rate 20, height  (1.854 m), weight 87.091 kg (192 lb), SpO2 99 %.Body mass index is 25.34 kg/(m^2).  General Appearance: Well Groomed  Patent attorney::  Good  Speech:  Normal Rate  Volume:  Decreased  Mood:  Euthymic  Affect:  Congruent  Thought Process:  vague  Orientation:  Full (Time, Place, and Person)  Thought Content:  Hallucinations: None  Suicidal Thoughts:  No  Homicidal Thoughts:  No  Memory:  Immediate;   Fair Recent;   Fair Remote;   Fair  Judgement:  Impaired  Insight:  Lacking  Psychomotor Activity:  Decreased  Concentration:  Fair  Recall:  NA  Fund of Knowledge:Fair  Language: Good  Akathisia:  No  Handed:    AIMS (if indicated):     Assets:  Psychologist, counselling Resources/Insurance  ADL's:  Intact  Cognition: WNL  Sleep:  Number of Hours: 8.25     Current Medications: Current Facility-Administered Medications  Medication Dose Route Frequency Provider Last Rate Last Dose  . acetaminophen (TYLENOL) tablet 650 mg  650 mg Oral Q6H  PRN Audery Amel, MD   650 mg at 12/05/14 0435  . alum & mag hydroxide-simeth (MAALOX/MYLANTA) 200-200-20 MG/5ML suspension 30 mL  30 mL Oral Q4H PRN Audery Amel, MD      . amantadine (SYMMETREL) capsule 100 mg  100 mg Oral BID Jimmy Footman, MD      . diphenhydrAMINE (BENADRYL) capsule 50 mg  50 mg Oral QHS Jimmy Footman, MD   50 mg at 12/05/14 2148  . fluPHENAZine (PROLIXIN) tablet 10 mg  10 mg Oral QHS Jimmy Footman, MD   10 mg at 12/05/14 2148  . fluPHENAZine (PROLIXIN) tablet 5 mg  5 mg Oral Daily Jimmy Footman, MD   5 mg at 12/06/14 7829  . fluPHENAZine decanoate (PROLIXIN) injection 25 mg  25 mg Intramuscular Q14 Days Jimmy Footman, MD   25 mg at 12/05/14 2024  . lamoTRIgine (LAMICTAL) tablet 200 mg  200 mg Oral QHS Jimmy Footman, MD   200 mg at 12/05/14 2148  . lithium carbonate (LITHOBID) CR tablet 600 mg  600 mg Oral Q12H Jimmy Footman, MD   600 mg at 12/06/14 5621  . LORazepam (ATIVAN) tablet 2 mg  2 mg Oral Q6H PRN Jimmy Footman, MD   2 mg at 12/05/14 2023  . magnesium hydroxide (MILK OF MAGNESIA) suspension 30 mL  30 mL Oral Daily PRN Audery Amel, MD      . nicotine (NICOTROL) 10 MG inhaler 1 continuous puffing  1 continuous puffing Inhalation PRN Jimmy Footman, MD        Lab Results:  No results found for this or any previous visit (from the past 48 hour(s)).  Physical Findings: AIMS:  , ,  ,  ,    CIWA:    COWS:     Treatment Plan Summary: Daily contact with patient to assess and evaluate symptoms and progress in treatment and Medication management   29 year old African-American male with history of schizoaffective disorder bipolar type. Patient was admitted after voicing suicidal ideation and auditory visual hallucinations.  Group home patient has been gone from the group home for a few days and has not received any medications during that time. During the time the patient was absent from the group home he has been using cannabis. At examination patient has euphoric mood, psychomotor agitation was sexually inappropriate and disinhibited.   For schizoaffective disorder: -Continue Prolixin but due to possible akathisia  The dose has been decreased to 5 mg by mouth every morning and 10 mg by mouth daily at bedtime.  Patient received Prolixin Decanoate 25 mg IM on 6-21 -Seroquel has been  discontinued due to over sedation -Continue Lamictal 200 mg by mouth daily at bedtime -Patient has been started on lithium (6/16) lithium 450 mg by mouth twice a day.  Lithium level on 6/20 was 0.65.  Lithium has been increased to 600 mg po bid.  Lithium level will be check on Sunday  EPS prevention: To simplify the regimen I will change today benztropine to Benadryl 50 mg daily at bedtime.  Patient continues to be seen pacing around the unit. Therefore I will start him on amantadine 100 mg by mouth twice a day  Agitation: -I will start the patient on Ativan 2 mg every 6 hours when necessary  Tobacco use disorder: Continue Nicotrol Inhaler when necessary  Metabolic syndrome: I will order hemoglobin A1c. Lipid panel was checked yesterday only showing mild elevation of LDL.  Labs: Lipid panel, TSH and prolactin level are basically  within normal limits.  Precautions continue every 15 minute checks  Hospitalization status: Continue involuntary commitment  Collateral information: Child psychotherapist contacted group home today and patient's ex-ACT team.  They confirm patient does not have a guardian. Patient has not received notice to leave group home and is able to return.    Discharge dispo: Plan to discharge on Monday after checking lithium level.  Medical Decision Making:  Established Problem, Stable/Improving (1)     Jimmy Footman 12/06/2014, 5:38 PM

## 2014-12-06 NOTE — Plan of Care (Signed)
Problem: Alteration in mood Goal: STG-Patient reports thoughts of self-harm to staff Outcome: Progressing Patient denies suicidal ideation.     

## 2014-12-06 NOTE — Progress Notes (Signed)
He is anxious with a blunt affect.Stated "something is stopping me to do good things.I think the devil wants to put me down."Verbelized that he feels tired.Attended few groups.Compliant with meds.Denies active suicidal ideation.Contracts for safety.Appetite good.

## 2014-12-06 NOTE — [facility] Group Notes (Signed)
[facility] Group Notes:  (Nursing/MHT/Case Management/Adjunct)  Date:  12/06/2014  Time:  11:52 AM  Type of Therapy:  Psychoeducational Skills  Participation Level:  None left early   Participation Quality:  Drowsy   Marquette Old 12/06/2014, 11:52 AM

## 2014-12-06 NOTE — Progress Notes (Signed)
D: Pt is awake and active in the milieu. Pt mood is appropriate and his affect is anxious. Pt is concerned about starting new medications.   A: Writer educated pt regarding new medications and provided emotional support.  R: Pt is receptive to education and continues to be pleasant and cooperative with staff. Will continue to monitor.

## 2014-12-06 NOTE — Progress Notes (Signed)
Recreation Therapy Notes  Date: 06.22.16 Time: 3:00 pm Location: Craft Room  Group Topic: Self-esteem  Goal Area(s) Addresses:  Patient will be able to identify benefit of self-esteem. Patient will be able to identify ways to increase self-esteem.  Behavioral Response: Did not attend  Intervention: Self-Portrait  Activity: Patients were instructed to draw their self-portrait, write something positive about themselves and their peers, and draw their self-portrait after they read the positive things peers wrote.   Education: LRT educated patients on ways to increase their self-esteem.   Education Outcome: Patient did not attend group.  Clinical Observations/Feedback: Patient did not attend group.   Jacquelynn Cree, LRT/CTRS 12/06/2014 4:27 PM

## 2014-12-07 NOTE — Progress Notes (Signed)
[facility] MD Progress Note  12/07/2014 12:13 PM Anthony Luna  MRN:  [id number] Subjective:  Patient reports feeling very tired today. He denies SI, HI or auditory or visual hallucinations. He denies depressed mood, or problems with appetite energy or concentration. He denies side effects from medications other that day time sedation. He denies having any physical complaints today. Per nursing he has been calm and compliant with medications. Patient has been denying hallucinations for the last 3 days. He received Prolixin Decanoate 25 mg IM on 6/21  Principal Problem: Schizoaffective disorder, bipolar type Diagnosis:   Patient Active Problem List   Diagnosis Date Noted  . Cannabis use disorder, severe, dependence [F12.20] 11/30/2014  . Schizoaffective disorder, bipolar type [F25.0] 11/30/2014  . Tobacco use disorder [Z72.0] 11/30/2014  . Grief [F43.21] 11/28/2014   Total Time spent with patient: 30 minutes   Past Medical History:  Past Medical History  Diagnosis Date  . None to low serum cortisol response with adrenocorticotrophic hormone (ACTH) stimulation test    History reviewed. No pertinent past surgical history. Family History: History reviewed. No pertinent family history. Social History:  History  Alcohol Use No     History  Drug Use  . Yes  . Special: Marijuana    History   Social History  . Marital Status: Single    Spouse Name: N/A  . Number of Children: N/A  . Years of Education: N/A   Social History Main Topics  . Smoking status: Current Every Day Smoker -- 0.50 packs/day    Types: Cigarettes    Start date: 11/29/2002  . Smokeless tobacco: Not on file  . Alcohol Use: No  . Drug Use: Yes    Special: Marijuana  . Sexual Activity: Not on file   Other Topics Concern  . None   Social History Narrative   Additional History:    Sleep: Good  Appetite:  Good   Assessment:   Musculoskeletal: Strength & Muscle Tone: within normal limits Gait &  Station: normal Patient leans: N/A   Psychiatric Specialty Exam: Physical Exam   Review of Systems  HENT: Negative.   Eyes: Negative.   Respiratory: Negative.   Cardiovascular: Negative.   Gastrointestinal: Negative.   Genitourinary: Negative.   Musculoskeletal: Negative.   Endo/Heme/Allergies: Negative.   Psychiatric/Behavioral: Negative.     Blood pressure 113/76, pulse 84, temperature 98.9 F (37.2 C), temperature source Oral, resp. rate 20, height  (1.854 m), weight 87.091 kg (192 lb), SpO2 99 %.Body mass index is 25.34 kg/(m^2).  General Appearance: Well Groomed  Patent attorney::  Good  Speech:  Normal Rate  Volume:  Decreased  Mood:  Euthymic  Affect:  Congruent  Thought Process:  vague  Orientation:  Full (Time, Place, and Person)  Thought Content:  Hallucinations: None  Suicidal Thoughts:  No  Homicidal Thoughts:  No  Memory:  Immediate;   Fair Recent;   Fair Remote;   Fair  Judgement:  Impaired  Insight:  Lacking  Psychomotor Activity:  Decreased  Concentration:  Fair  Recall:  NA  Fund of Knowledge:Fair  Language: Good  Akathisia:  No  Handed:    AIMS (if indicated):     Assets:  Psychologist, counselling Resources/Insurance  ADL's:  Intact  Cognition: WNL  Sleep:  Number of Hours: 6.75     Current Medications: Current Facility-Administered Medications  Medication Dose Route Frequency Provider Last Rate Last Dose  . acetaminophen (TYLENOL) tablet 650 mg  650 mg Oral Q6H  PRN Audery Amel, MD   650 mg at 12/05/14 0435  . alum & mag hydroxide-simeth (MAALOX/MYLANTA) 200-200-20 MG/5ML suspension 30 mL  30 mL Oral Q4H PRN Audery Amel, MD      . amantadine (SYMMETREL) capsule 100 mg  100 mg Oral BID Jimmy Footman, MD   100 mg at 12/07/14 0910  . fluPHENAZine (PROLIXIN) tablet 10 mg  10 mg Oral QHS Jimmy Footman, MD   10 mg at 12/06/14 2230  . fluPHENAZine (PROLIXIN) tablet 5 mg  5 mg Oral Daily Jimmy Footman, MD   5 mg at 12/07/14 0910  . fluPHENAZine decanoate (PROLIXIN) injection 25 mg  25 mg Intramuscular Q14 Days Jimmy Footman, MD   25 mg at 12/05/14 2024  . lamoTRIgine (LAMICTAL) tablet 200 mg  200 mg Oral QHS Jimmy Footman, MD   200 mg at 12/06/14 2230  . lithium carbonate (LITHOBID) CR tablet 600 mg  600 mg Oral Q12H Jimmy Footman, MD   600 mg at 12/07/14 0910  . magnesium hydroxide (MILK OF MAGNESIA) suspension 30 mL  30 mL Oral Daily PRN Audery Amel, MD      . nicotine (NICOTROL) 10 MG inhaler 1 continuous puffing  1 continuous puffing Inhalation PRN Jimmy Footman, MD        Lab Results:  No results found for this or any previous visit (from the past 48 hour(s)).  Physical Findings: AIMS:  , ,  ,  ,    CIWA:    COWS:     Treatment Plan Summary: Daily contact with patient to assess and evaluate symptoms and progress in treatment and Medication management   29 year old African-American male with history of schizoaffective disorder bipolar type. Patient was admitted after voicing suicidal ideation and auditory visual hallucinations.  Group home patient has been gone from the group home for a few days and has not received any medications during that time. During the time the patient was absent from the group home he has been using cannabis. At examination patient has euphoric mood, psychomotor agitation was sexually inappropriate and disinhibited.   For schizoaffective disorder: -Continue Prolixin but due to possible akathisia the dose has been decreased to 5 mg by mouth every morning and 10 mg by mouth daily at bedtime.  Patient received Prolixin Decanoate 25 mg IM on 6-21 -Seroquel has been discontinued due to over sedation -Continue Lamictal 200 mg by mouth daily at bedtime -Patient has been started on lithium (6/16) lithium 450 mg by mouth twice a day.  Lithium level on 6/20 was 0.65.  Lithium has been increased  to 600 mg po bid.  Lithium level will be check on Sunday  EPS/akathisia: Patient has been seen pacing the unit up and down. He was started on amantadine 100 mg by mouth twice a day.  Tobacco use disorder: Continue Nicotrol Inhaler when necessary  Metabolic syndrome: I will order hemoglobin A1c. Lipid panel was checked yesterday only showing mild elevation of LDL.  Labs: Lipid panel, TSH and prolactin level are basically within normal limits.  Precautions continue every 15 minute checks  Hospitalization status: Continue involuntary commitment  Collateral information: Child psychotherapist contacted group home today and patient's ex-ACT team.  They confirm patient does not have a guardian. Patient has not received notice to leave group home and is able to return.    Discharge dispo: Plan to discharge on Monday after checking lithium level.  Medical Decision Making:  Established Problem, Stable/Improving (1)  Jimmy Footman 12/07/2014, 12:13 PM

## 2014-12-07 NOTE — Progress Notes (Signed)
Recreation Therapy Notes  Date: 06.23.16 Time: 3:00 pm Location: Craft Room  Group Topic: Leisure Education  Goal Area(s) Addresses:  Patient will identify one positive leisure activity.  Behavioral Response: Did not attend  Intervention: Leisure Time  Activity: Patients were instructed to write down one positive leisure activity. Patients were instructed to completed "Leisure Time Clock" worksheet. Patients were instructed to list 10 positive emotions as a group. Patients were instructed to match the leisure activities with the emotions.   Education: LRT educated patient on what was needed to participate in leisure.   Education Outcome: Patient did not attend group.  Clinical Observations/Feedback: Patient did not attend group.  Jacquelynn Cree, LRT/CTRS 12/07/2014 4:17 PM

## 2014-12-07 NOTE — Plan of Care (Signed)
Problem: Alteration in mood Goal: LTG-Patient reports reduction in suicidal thoughts (Patient reports reduction in suicidal thoughts and is able to verbalize a safety plan for whenever patient is feeling suicidal)  Outcome: Progressing Patient denies SI/HI.      

## 2014-12-07 NOTE — Progress Notes (Signed)
Alert and oriented x 3 with periods of confusion to situation, affect is flat but brightens upon approach, denies SI/HI complaint with medication, interacting appropriately with peers, 15 minutes checks maintained.

## 2014-12-07 NOTE — Plan of Care (Signed)
Problem: [facility] Participation in Recreation Therapeutic Interventions Goal: STG-Other Recreation Therapy Goal (Specify) STG: Stress management - Within 7 treatment sessions, patient will demonstrate at least one stress management technique in each of 2 treatment sessions to increase stress management skills post d/c.  Outcome: Not Progressing Treatment Session 4; Completed 0 out of 2: At approximately 11:00 am, LRT met with patient in patient room. Patient reported he had not been practicing the stress management techniques. LRT encouraged patient to practice the stress management techniques.  Leonette Monarch, LRT/CTRS 06.23.16 1:08 pm

## 2014-12-07 NOTE — Progress Notes (Signed)
Patient improving in structured environment. His mood has stabilized and auditory hallucinations are decreasing. Patient has been cooperative with all nursing interventions and receptive to teaching. He was encouraged to attend groups to assist with learning coping skills. Will continue per treatment plan, monitor mood, mental status, safety.

## 2014-12-07 NOTE — Progress Notes (Signed)
Patient remained isolative in bed. Alert oriented but avoiding others. Reporting that he feels tired and wants to rest. Was encouraged to get out of bed as tolerated: presented to the medication room, had his medications and was encouraged to sit in the day room with others and have a snack. Patient stayed in the dayroom for a short moment then returned to bed. Currently in bed awake and has no major concern. Safety precautions maintained.

## 2014-12-07 NOTE — [facility] Group Notes (Signed)
[facility] Group Notes:  (Nursing/MHT/Case Management/Adjunct)  Date:  12/07/2014  Time:  3:50 PM  Type of Therapy:  Movement Therapy  Participation Level:  Active  Participation Quality:  Appropriate and Attentive  Affect:  Appropriate  Cognitive:  Alert, Appropriate and Oriented  Insight:  Appropriate  Engagement in Group:  Engaged  Modes of Intervention:  Activity  Summary of Progress/Problems:  Anthony Luna 12/07/2014, 3:50 PM

## 2014-12-07 NOTE — [facility] Group Notes (Signed)
[facility] Group Notes:  (Nursing/MHT/Case Management/Adjunct)  Date:  12/07/2014  Time:  9:13 AM  Type of Therapy:  Community Meeting   Participation Level:  Did Not Attend  Summary of Progress/Problems:  Anthony Luna 12/07/2014, 9:13 AM

## 2014-12-07 NOTE — Plan of Care (Signed)
Problem: Alteration in mood Goal: LTG-Pt's behavior demonstrates decreased signs of depression (Patient's behavior demonstrates decreased signs of depression to the point the patient is safe to return home and continue treatment in an outpatient setting)  Outcome: Progressing Patient interacting appropriately with peers, and participating in treatment plan.

## 2014-12-07 NOTE — Progress Notes (Signed)
NUTRITION NOTE:   Anthony Luna is a 29 y.o. male with Schizoaffective disorder, bipolar type  PMH:  Past Medical History  Diagnosis Date  . None to low serum cortisol response with adrenocorticotrophic hormone (ACTH) stimulation test     Diet Order: Regular  Current Nutrition: recorded po intake 87% of meals, mostly 90-100% of meals  Anthropometrics:  Body mass index is 25.34 kg/(m^2).   Labs: reviewed  Meds: reviewed   Pt is at no nutrition risk due to diagnosis/current problem of schizoaffective disorder/bipolar type, BMI of 25, regular diet order, 87% po intake of meals. No nutrition intervention warranted at this time. Will sign off. Please re-consult RD if nutritional issues arise.   Romelle Starcher MS, RD, LDN [phone] Pager

## 2014-12-07 NOTE — [facility] Group Notes (Signed)
[facility] Group Notes:  (Nursing/MHT/Case Management/Adjunct)  Date:  12/07/2014  Time:  4:12 AM  Type of Therapy:  Psychoeducational Skills  Participation Level:  Did Not Attend  Summary of Progress/Problems:  Foy Guadalajara 12/07/2014, 4:12 AM

## 2014-12-08 MED ORDER — LAMOTRIGINE 100 MG PO TABS
100.0000 mg | ORAL_TABLET | Freq: Every day | ORAL | Status: DC
  Administered 2014-12-08 – 2014-12-09 (×2): 100 mg via ORAL
  Filled 2014-12-08 (×3): qty 1

## 2014-12-08 MED ORDER — FLUPHENAZINE HCL 5 MG PO TABS: 5.0000 mg | ORAL_TABLET | Freq: Every day | ORAL | Status: DC

## 2014-12-08 MED ORDER — FLUPHENAZINE HCL 10 MG PO TABS: 10.0000 mg | ORAL_TABLET | Freq: Every day | ORAL | Status: DC

## 2014-12-08 MED ORDER — AMANTADINE HCL 100 MG PO CAPS: 100.0000 mg | ORAL_CAPSULE | Freq: Two times a day (BID) | ORAL | Status: DC

## 2014-12-08 MED ORDER — LITHIUM CARBONATE ER 300 MG PO TBCR: 600.0000 mg | EXTENDED_RELEASE_TABLET | Freq: Two times a day (BID) | ORAL | Status: DC

## 2014-12-08 MED ORDER — FLUPHENAZINE DECANOATE 25 MG/ML IJ SOLN: 25.0000 mg | INTRAMUSCULAR | Status: DC

## 2014-12-08 NOTE — Plan of Care (Signed)
Problem: Ineffective individual coping Goal: STG: Pt will be able to identify effective and ineffective STG: Pt will be able to identify effective and ineffective coping patterns  Outcome: Progressing Patient reports reports decrease of frustration related to auditory hallucinations

## 2014-12-08 NOTE — [facility] Group Notes (Signed)
[facility] Group Notes:  (Nursing/MHT/Case Management/Adjunct)  Date:  12/08/2014  Time:  11:44 AM  Type of Therapy:  Group Therapy  Participation Level:  Minimal  Participation Quality:  Appropriate, Attentive and Sharing  Affect:  Appropriate  Cognitive:  Alert, Appropriate and Oriented  Insight:  Appropriate  Engagement in Group:  Improving  Modes of Intervention:  Activity  Summary of Progress/Problems:  Anthony Luna Anthony Luna Anthony Luna 12/08/2014, 11:44 AM

## 2014-12-08 NOTE — Progress Notes (Signed)
Patient stayed in room at the beginning of the shift and was out in the day room later. Patient vomited in in his room but reported that he was fine. "I thought about this girl I used to date...and I threw up". Thought process improving but patient admits that he hears voices "on and off". Was encouraged to talk to staff when voices bother him. Patient was pleasant and was seen interacting with peers. Currently in bed sleeping and staff continue to monitor.

## 2014-12-08 NOTE — [facility] Group Notes (Signed)
[facility] LCSW Group Therapy  12/08/2014 4:01 PM  Type of Therapy:  Group Therapy  Participation Level:  Did Not Attend  Modes of Intervention:  Discussion, Education, Problem-solving, Socialization and Support  Summary of Progress/Problems:Feelings around relapse and recovery: Pt will discuss emotions they experience before and after a relapse. Pt will be encouraged to explore feelings around recovery.    [name] MSW, LCSWA  12/08/2014, 4:01 PM

## 2014-12-08 NOTE — [facility] Group Notes (Signed)
[facility] LCSW Aftercare Discharge Planning Group Note  12/08/2014 10:50 AM  Participation Quality:  Patient did not attend group.  Affect:  n/a  Cognitive:  n/a  Insight:  n/a  Engagement in Group:  n/a  Modes of Intervention:  n/a  Summary of Progress/Problems:  Beryl Meager T 12/08/2014, 10:50 AM

## 2014-12-08 NOTE — Plan of Care (Signed)
Problem: [facility] Participation in Recreation Therapeutic Interventions Goal: STG-Other Recreation Therapy Goal (Specify) STG: Stress management - Within 7 treatment sessions, patient will demonstrate at least one stress management technique in each of 2 treatment sessions to increase stress management skills post d/c.  Outcome: Progressing Treatment Session 5; Completed 1 out of 2: At approximately 12:25 pm, LRT met with patient in patient room. Patient demonstrated one stress management technique. LRT encouraged patient to continue practicing the stress management techniques.  Leonette Monarch, LRT/CTRS 06.24.16 2:11 pm

## 2014-12-08 NOTE — Progress Notes (Signed)
Recreation Therapy Notes  Date: 06.24.16 Time: 3:00 pm Location: Craft Room  Group Topic: Problem Solving, Communication, Teamwork  Goal Area(s) Addresses:  Patient will work in teams towards shared goal. Patient will verbalize skills needed to make activity successful. Patient will verbalize benefit of using skills identified to reach post d/c goals.  Behavioral Response: Did not attend  Intervention: Landing Pad  Activity: Patients were given 15 straws and approximately 2.5 feet of tape and instructed to build a landing pad to catch a golf ball.  Education: LRT educated patients on why communication, teamwork, and problem solving are important.  Education Outcome: Patient did not attend group.  Clinical Observations/Feedback: Patient did not attend group.  Jahliyah Trice M, LRT/CTRS 12/08/2014 5:04 PM 

## 2014-12-08 NOTE — Progress Notes (Signed)
D) Patient pleasant and cooperative upon my assessment. Patient did not complete Self Inventory Assessment. Patient denies SI/HI, denies A/V hallucinations.  RN noticed decreased flickering of his left thumb and finger.  Patient's affect is improved and mood is sad but brightens.  Reports that his appetite is good.   A) Patient offered support and encouragement, patient encouraged to discuss feelings/concerns with staff. Patient verbalized understanding. Patient monitored Q15 minutes for safety. Patient met with MD  to discuss today's goals and plan of care.  R) Patient visible in milieu, has  attended groups today.  Patient come to dining room for meals and snacks. Patient appropriate with staff and peers.   Patient taking medications as ordered. Will continue to monitor.

## 2014-12-08 NOTE — [facility] Group Notes (Signed)
[facility] Group Notes:  (Nursing/MHT/Case Management/Adjunct)  Date:  12/08/2014  Time:  11:22 PM  Type of Therapy:  Group Therapy  Participation Level:  Active  Participation Quality:  Appropriate and Attentive  Affect:  Appropriate  Cognitive:  Alert and Appropriate  Insight:  Appropriate  Engagement in Group:  Engaged  Modes of Intervention:  Discussion  Summary of Progress/Problems:  Juliane Guest Joy Elisabeth Strom 12/08/2014, 11:22 PM

## 2014-12-08 NOTE — [facility] Group Notes (Signed)
[facility] Group Notes:  (Nursing/MHT/Case Management/Adjunct)  Date:  12/08/2014  Time:  4:04 AM  Type of Therapy:  Group Therapy  Participation Level:  Did Not Attend   Anthony Luna 12/08/2014, 4:04 AM

## 2014-12-08 NOTE — Progress Notes (Signed)
[facility] MD Progress Note  12/08/2014 11:44 AM Anthony Luna  MRN:  [id number] Subjective:29 year old African-American male with history of schizoaffective disorder bipolar type. Patient was admitted after voicing suicidal ideation and auditory visual hallucinations. This patient has a past history of manslaughter for which he was in Prison.  Patient reports feeling very tired today. He denies SI, HI or auditory or visual hallucinations. He denies depressed mood, or problems with appetite energy or concentration. He denies side effects from medications other that day time sedation. He denies having any physical complaints today. Per nursing he has been calm and compliant with medications. Patient has been denying hallucinations for several days to me however he does report hallucinations to nurses. He received Prolixin Decanoate 25 mg IM on 6/21.  Per nursing: Patient reports reports decrease of frustration related to auditory hallucinations  Principal Problem: Schizoaffective disorder, bipolar type Diagnosis:   Patient Active Problem List   Diagnosis Date Noted  . Cannabis use disorder, severe, dependence [F12.20] 11/30/2014  . Schizoaffective disorder, bipolar type [F25.0] 11/30/2014  . Tobacco use disorder [Z72.0] 11/30/2014  . Grief [F43.21] 11/28/2014   Total Time spent with patient: 30 minutes   Past Medical History:  Past Medical History  Diagnosis Date  . None to low serum cortisol response with adrenocorticotrophic hormone (ACTH) stimulation test    History reviewed. No pertinent past surgical history. Family History: History reviewed. No pertinent family history. Social History:  History  Alcohol Use No     History  Drug Use  . Yes  . Special: Marijuana    History   Social History  . Marital Status: Single    Spouse Name: N/A  . Number of Children: N/A  . Years of Education: N/A   Social History Main Topics  . Smoking status: Current Every Day Smoker -- 0.50  packs/day    Types: Cigarettes    Start date: 11/29/2002  . Smokeless tobacco: Not on file  . Alcohol Use: No  . Drug Use: Yes    Special: Marijuana  . Sexual Activity: Not on file   Other Topics Concern  . None   Social History Narrative   Additional History:    Sleep: Good  Appetite:  Good   Assessment:   Musculoskeletal: Strength & Muscle Tone: within normal limits Gait & Station: normal Patient leans: N/A   Psychiatric Specialty Exam: Physical Exam   Review of Systems  HENT: Negative.   Eyes: Negative.   Respiratory: Negative.   Cardiovascular: Negative.   Gastrointestinal: Negative.   Genitourinary: Negative.   Musculoskeletal: Negative.   Endo/Heme/Allergies: Negative.   Psychiatric/Behavioral: Negative.     Blood pressure 128/79, pulse 93, temperature 98.3 F (36.8 C), temperature source Oral, resp. rate 20, height  (1.854 m), weight 87.091 kg (192 lb), SpO2 99 %.Body mass index is 25.34 kg/(m^2).  General Appearance: Well Groomed  Patent attorney::  Good  Speech:  Normal Rate  Volume:  Decreased  Mood:  Euthymic  Affect:  Congruent  Thought Process:  vague  Orientation:  Full (Time, Place, and Person)  Thought Content:  Hallucinations: None  Suicidal Thoughts:  No  Homicidal Thoughts:  No  Memory:  Immediate;   Fair Recent;   Fair Remote;   Fair  Judgement:  Impaired  Insight:  Lacking  Psychomotor Activity:  Decreased  Concentration:  Fair  Recall:  NA  Fund of Knowledge:Fair  Language: Good  Akathisia:  No  Handed:    AIMS (if indicated):  Assets:  Architect  ADL's:  Intact  Cognition: WNL  Sleep:  Number of Hours: 7.25     Current Medications: Current Facility-Administered Medications  Medication Dose Route Frequency Provider Last Rate Last Dose  . acetaminophen (TYLENOL) tablet 650 mg  650 mg Oral Q6H PRN Audery Amel, MD   650 mg at 12/05/14 0435  . alum & mag hydroxide-simeth  (MAALOX/MYLANTA) 200-200-20 MG/5ML suspension 30 mL  30 mL Oral Q4H PRN Audery Amel, MD      . amantadine (SYMMETREL) capsule 100 mg  100 mg Oral BID Jimmy Footman, MD   100 mg at 12/08/14 1003  . fluPHENAZine (PROLIXIN) tablet 10 mg  10 mg Oral QHS Jimmy Footman, MD   10 mg at 12/07/14 2126  . fluPHENAZine (PROLIXIN) tablet 5 mg  5 mg Oral Daily Jimmy Footman, MD   5 mg at 12/08/14 1004  . fluPHENAZine decanoate (PROLIXIN) injection 25 mg  25 mg Intramuscular Q14 Days Jimmy Footman, MD   25 mg at 12/05/14 2024  . lamoTRIgine (LAMICTAL) tablet 100 mg  100 mg Oral QHS Jimmy Footman, MD      . lithium carbonate (LITHOBID) CR tablet 600 mg  600 mg Oral Q12H Jimmy Footman, MD   600 mg at 12/08/14 1004  . magnesium hydroxide (MILK OF MAGNESIA) suspension 30 mL  30 mL Oral Daily PRN Audery Amel, MD      . nicotine (NICOTROL) 10 MG inhaler 1 continuous puffing  1 continuous puffing Inhalation PRN Jimmy Footman, MD        Lab Results:  No results found for this or any previous visit (from the past 48 hour(s)).  Physical Findings: AIMS:  , ,  ,  ,    CIWA:    COWS:     Treatment Plan Summary: Daily contact with patient to assess and evaluate symptoms and progress in treatment and Medication management   29 year old African-American male with history of schizoaffective disorder bipolar type. Patient was admitted after voicing suicidal ideation and auditory visual hallucinations.  Group home patient has been gone from the group home for a few days and has not received any medications during that time. During the time the patient was absent from the group home he has been using cannabis. At examination patient has euphoric mood, psychomotor agitation was sexually inappropriate and disinhibited.   For schizoaffective disorder: -Continue Prolixin but due to possible akathisia the dose has been decreased to 5 mg  by mouth every morning and 10 mg by mouth daily at bedtime.  Patient received Prolixin Decanoate 25 mg IM on 6-21 -Seroquel has been discontinued due to over sedation -Continue Lamictal but will decrease to 100 mg in order to minimize med regimen.  Plan to taper of lamictal.   -Patient has been started on lithium (6/16) lithium 450 mg by mouth twice a day.  Lithium level on 6/20 was 0.65.  Lithium has been increased to 600 mg po bid.  Lithium level will be check on Sunday  EPS/akathisia: Patient has been seen pacing the unit up and down. He was started on amantadine 100 mg by mouth twice a day.  Tobacco use disorder: Continue Nicotrol Inhaler when necessary  Metabolic syndrome: I will order hemoglobin A1c. Lipid panel was checked yesterday only showing mild elevation of LDL.  Labs: Lipid panel, TSH and prolactin level are basically within normal limits.  Precautions continue every 15 minute checks  Hospitalization status: Continue involuntary commitment  Collateral information: Child psychotherapist contacted group home today and patient's ex-ACT team.  They confirm patient does not have a guardian. Patient has not received notice to leave group home and is able to return.    Discharge dispo: Plan to discharge on Monday after checking lithium level.  Medical Decision Making:  Established Problem, Stable/Improving (1)     Jimmy Footman 12/08/2014, 11:44 AM

## 2014-12-08 NOTE — Plan of Care (Signed)
Problem: Ineffective individual coping Goal: LTG: Patient will report a decrease in negative feelings Outcome: Progressing Patient reports decrease of auditory hallucinations

## 2014-12-08 NOTE — Progress Notes (Signed)
Assumed care of patient at midnight. No needs or distress noted. Will continue to monitor.  

## 2014-12-09 DIAGNOSIS — F250 Schizoaffective disorder, bipolar type: Principal | ICD-10-CM

## 2014-12-09 NOTE — [facility] Group Notes (Signed)
[facility] Group Notes:  (Nursing/MHT/Case Management/Adjunct)  Date:  12/09/2014  Time:  2:25 PM  Type of Therapy:  Group Therapy  Participation Level:  Active  Participation Quality:  Appropriate and Attentive  Affect:  Appropriate  Cognitive:  Alert, Appropriate and Disorganized  Insight:  Appropriate  Engagement in Group:  Engaged  Modes of Intervention:  Problem-solving  Summary of Progress/Problems:  Anthony Luna 12/09/2014, 2:25 PM

## 2014-12-09 NOTE — [facility] Group Notes (Signed)
[facility] LCSW Group Therapy  12/09/2014 3:25 PM  Type of Therapy:  Group Therapy  Participation Level:  Did Not Attend  Modes of Intervention:  Discussion, Education, Socialization and Support  Summary of Progress/Problems:Pt will identify unhealthy thoughts and how they impact their emotions and behavior. Pt will be encouraged to discuss these thoughts, emotions and behaviors with the group. Pt will be asked to pick a positive affirmation and discuss what emotion they experienced.    [name] MSW, LCSWA 12/09/2014, 3:25 PM

## 2014-12-09 NOTE — Progress Notes (Signed)
[facility] MD Progress Note  12/09/2014 1:43 PM Anthony Luna  MRN:  [id number] Subjective:29 year old African-American male with history of schizoaffective disorder bipolar type. Patient was admitted after voicing suicidal ideation and auditory visual hallucinations. This patient has a past history of manslaughter for which he was in Prison.  Patient reports feeling very tired today. He denies SI, HI or auditory or visual hallucinations. He denies depressed mood, or problems with appetite energy or concentration. He states he is somewhat tired from his medications. However he is able to state that they do help him with his voices. He denies any hallucinations at this time. He denies any suicidal ideation at this time. He denies any physical discomfort.  Per nursing: Patient reports reports decrease of frustration related to auditory hallucinations  Principal Problem: Schizoaffective disorder, bipolar type Diagnosis:   Patient Active Problem List   Diagnosis Date Noted  . Cannabis use disorder, severe, dependence [F12.20] 11/30/2014  . Schizoaffective disorder, bipolar type [F25.0] 11/30/2014  . Tobacco use disorder [Z72.0] 11/30/2014  . Grief [F43.21] 11/28/2014   Total Time spent with patient: 30 minutes   Past Medical History:  Past Medical History  Diagnosis Date  . None to low serum cortisol response with adrenocorticotrophic hormone (ACTH) stimulation test    History reviewed. No pertinent past surgical history. Family History: History reviewed. No pertinent family history. Social History:  History  Alcohol Use No     History  Drug Use  . Yes  . Special: Marijuana    History   Social History  . Marital Status: Single    Spouse Name: N/A  . Number of Children: N/A  . Years of Education: N/A   Social History Main Topics  . Smoking status: Current Every Day Smoker -- 0.50 packs/day    Types: Cigarettes    Start date: 11/29/2002  . Smokeless tobacco: Not on file  .  Alcohol Use: No  . Drug Use: Yes    Special: Marijuana  . Sexual Activity: Not on file   Other Topics Concern  . None   Social History Narrative   Additional History:    Sleep: Good  Appetite:  Good   Assessment:   Musculoskeletal: Strength & Muscle Tone: within normal limits Gait & Station: normal Patient leans: N/A   Psychiatric Specialty Exam: Physical Exam  Review of Systems  HENT: Negative.   Eyes: Negative.   Respiratory: Negative.   Cardiovascular: Negative.   Gastrointestinal: Negative.   Genitourinary: Negative.   Musculoskeletal: Negative.   Endo/Heme/Allergies: Negative.   Psychiatric/Behavioral: Negative.     Blood pressure 137/85, pulse 66, temperature 98 F (36.7 C), temperature source Oral, resp. rate 20, height 6\' 1"  (1.854 m), weight 87.091 kg (192 lb), SpO2 99 %.Body mass index is 25.34 kg/(m^2).  General Appearance: Well Groomed  Patent attorney::  Good  Speech:  Normal Rate  Volume:  Decreased  Mood:  Euthymic  Affect:  Congruent  Thought Process:  vague  Orientation:  Full (Time, Place, and Person)  Thought Content:  Hallucinations: None  Suicidal Thoughts:  No  Homicidal Thoughts:  No  Memory:  Immediate;   Fair Recent;   Fair Remote;   Fair  Judgement:  Impaired  Insight:  Lacking  Psychomotor Activity:  Decreased  Concentration:  Fair  Recall:  NA  Fund of Knowledge:Fair  Language: Good  Akathisia:  No  Handed:    AIMS (if indicated):     Assets:  Psychologist, counselling Resources/Insurance  ADL's:  Intact  Cognition: WNL  Sleep:  Number of Hours: 6.45     Current Medications: Current Facility-Administered Medications  Medication Dose Route Frequency Provider Last Rate Last Dose  . acetaminophen (TYLENOL) tablet 650 mg  650 mg Oral Q6H PRN Audery Amel, MD   650 mg at 12/05/14 0435  . alum & mag hydroxide-simeth (MAALOX/MYLANTA) 200-200-20 MG/5ML suspension 30 mL  30 mL Oral Q4H PRN Audery Amel, MD      .  amantadine (SYMMETREL) capsule 100 mg  100 mg Oral BID Jimmy Footman, MD   100 mg at 12/09/14 1006  . fluPHENAZine (PROLIXIN) tablet 10 mg  10 mg Oral QHS Jimmy Footman, MD   10 mg at 12/08/14 2140  . fluPHENAZine (PROLIXIN) tablet 5 mg  5 mg Oral Daily Jimmy Footman, MD   5 mg at 12/09/14 1006  . fluPHENAZine decanoate (PROLIXIN) injection 25 mg  25 mg Intramuscular Q14 Days Jimmy Footman, MD   25 mg at 12/05/14 2024  . lamoTRIgine (LAMICTAL) tablet 100 mg  100 mg Oral QHS Jimmy Footman, MD   100 mg at 12/08/14 2140  . lithium carbonate (LITHOBID) CR tablet 600 mg  600 mg Oral Q12H Jimmy Footman, MD   600 mg at 12/09/14 1006  . magnesium hydroxide (MILK OF MAGNESIA) suspension 30 mL  30 mL Oral Daily PRN Audery Amel, MD      . nicotine (NICOTROL) 10 MG inhaler 1 continuous puffing  1 continuous puffing Inhalation PRN Jimmy Footman, MD        Lab Results:  No results found for this or any previous visit (from the past 48 hour(s)).  Physical Findings: AIMS:  , ,  ,  ,    CIWA:    COWS:     Treatment Plan Summary: Daily contact with patient to assess and evaluate symptoms and progress in treatment and Medication management   29 year old African-American male with history of schizoaffective disorder bipolar type. Patient was admitted after voicing suicidal ideation and auditory visual hallucinations.  Group home patient has been gone from the group home for a few days and has not received any medications during that time. During the time the patient was absent from the group home he has been using cannabis. At examination patient has euphoric mood, psychomotor agitation was sexually inappropriate and disinhibited.   For schizoaffective disorder: -Continue Prolixin but due to possible akathisia the dose has been decreased to 5 mg by mouth every morning and 10 mg by mouth daily at bedtime.  Patient received  Prolixin Decanoate 25 mg IM on 6-21 -Seroquel has been discontinued due to over sedation -Continue Lamictal but will decrease to 100 mg in order to minimize med regimen.  Plan to taper of lamictal.   -Patient has been started on lithium (6/16) lithium 450 mg by mouth twice a day.  Lithium level on 6/20 was 0.65.  Lithium has been increased to 600 mg po bid.  Lithium level will be check on Sunday  EPS/akathisia: Patient has been seen pacing the unit up and down. He was started on amantadine 100 mg by mouth twice a day.  Tobacco use disorder: Continue Nicotrol Inhaler when necessary  Metabolic syndrome: I will order hemoglobin A1c. Lipid panel was checked yesterday only showing mild elevation of LDL.  Labs: Lipid panel, TSH and prolactin level are basically within normal limits.  Precautions continue every 15 minute checks  Hospitalization status: Continue involuntary commitment  Collateral information: Child psychotherapist contacted group  home today and patient's ex-ACT team.  They confirm patient does not have a guardian. Patient has not received notice to leave group home and is able to return.    Discharge dispo: Plan to discharge on Monday after checking lithium level.  Medical Decision Making:  Established Problem, Stable/Improving (1)     Wallace Going 12/09/2014, 1:43 PM

## 2014-12-09 NOTE — Progress Notes (Signed)
D) Patient pleasant and cooperative upon my assessment. Patient did not complete Self Inventory Assessment and rates depression as  0 /10, patient rates hopeless feelings as 0 /10.  Patient denies SI/HI, denies A/V hallucinations.  Patient's affect is improved and mood is appropriate.  Reports that his appetite is good.  Patient states that is ready for discharge  A) Patient offered support and encouragement, patient encouraged to discuss feelings/concerns with staff. Patient verbalized understanding. Patient monitored Q15 minutes for safety. Patient met with MD  to discuss today's goals and plan of care.  R) Patient visible in milieu, has attended groups today.  Patient come to dining room for meals and snacks. Patient appropriate with staff and peers.   Patient taking medications as ordered. Will continue to monitor.

## 2014-12-09 NOTE — Plan of Care (Signed)
Problem: Alteration in mood Goal: LTG-Patient reports reduction in suicidal thoughts (Patient reports reduction in suicidal thoughts and is able to verbalize a safety plan for whenever patient is feeling suicidal)  Outcome: Progressing Patient denies suicidal thoughts and contracts for safety

## 2014-12-09 NOTE — Plan of Care (Signed)
Problem: Alteration in mood Goal: STG-Patient reports thoughts of self-harm to staff Outcome: Progressing Patient willing to report thoughts of self harm as presented

## 2014-12-10 LAB — LITHIUM LEVEL: LITHIUM LVL: 1.2 mmol/L (ref 0.60–1.20)

## 2014-12-10 MED ORDER — LAMOTRIGINE 25 MG PO TABS
25.0000 mg | ORAL_TABLET | Freq: Every day | ORAL | Status: DC
  Administered 2014-12-10: 25 mg via ORAL
  Filled 2014-12-10 (×2): qty 1

## 2014-12-10 MED ORDER — LITHIUM CARBONATE ER 300 MG PO TBCR
600.0000 mg | EXTENDED_RELEASE_TABLET | Freq: Every morning | ORAL | Status: DC
  Administered 2014-12-10: 600 mg via ORAL
  Filled 2014-12-10: qty 2

## 2014-12-10 MED ORDER — LITHIUM CARBONATE ER 450 MG PO TBCR: 450.0000 mg | EXTENDED_RELEASE_TABLET | Freq: Two times a day (BID) | ORAL | Status: DC

## 2014-12-10 MED ORDER — LITHIUM CARBONATE ER 300 MG PO TBCR: 300.0000 mg | EXTENDED_RELEASE_TABLET | Freq: Every day | ORAL | Status: DC

## 2014-12-10 NOTE — Progress Notes (Signed)
Pt isolates to his room. Did not attend group. Did come for medications. Med compliant. Little interaction with staff or peers. Pt states he is feeling better but still has the voices that come and go.

## 2014-12-10 NOTE — [facility] Group Notes (Signed)
[facility] LCSW Group Therapy  12/10/2014 3:47 PM  Type of Therapy:  Group Therapy  Participation Level:  Did Not Attend  Modes of Intervention:  Education, Exploration, Role-play, Socialization and Support  Summary of Progress/Problems:Healthy Communication: Pt will discuss the importance of communication. They will be encouraged to share examples of unhealthy communication they have experienced. Also, they will be encouraged to provide strategies for healthy communication.    Daisy Floro Newel Oien MSW, LCSWA  12/10/2014, 3:47 PM

## 2014-12-10 NOTE — Progress Notes (Addendum)
D) Patient pleasant and cooperative upon my assessment. Patient did not complete Self Inventory Assessment however, he  rates his depression as  0 /10, patient rates hopeless feelings as 0 /10.  Patient denies SI/HI, denies A/V hallucinations.  Patient's affect is flat and mood is sad/depressed Reports that his appetite is good.   Patient did not eat breakfast.  States he was not hungry, however he did eat breakfast.   A) Patient offered support and encouragement, patient encouraged to discuss feelings/concerns with staff. Patient verbalized understanding. Patient monitored Q15 minutes for safety. Patient met with MD  to discuss today's goals and plan of care. Patient feels that his medications are too strong and that he is unable to function during the day.  R) Patient spent most of day in his room,  He did not attend groups today.  Patient come to dining room for meals and snacks. Patient appropriate with staff and peers.   Patient taking medications as ordered. Will continue to monitor.

## 2014-12-10 NOTE — Progress Notes (Signed)
Patient was in the milieu at the beginning of the shift. Currently in bed resting. Alert and oriented and denying SI/HI, denying hallucinations. Reports that he is feeling improvement in thinking: calm and cooperative, well groomed. Medications were taken to him in room as he requested. Patient had a snack and denied having any concern. Emotional support offered and staff continue to monitor for safety and other needs.

## 2014-12-10 NOTE — Progress Notes (Signed)
[facility] MD Progress Note  12/10/2014 9:51 AM Anthony Luna  MRN:  [id number] Subjective:29 year old African-American male with history of schizoaffective disorder bipolar type. Patient was admitted after voicing suicidal ideation and auditory visual hallucinations. This patient has a past history of manslaughter for which he was in Prison.  Patient reports feeling very tired today. He denies SI, HI or auditory or visual hallucinations. He denies depressed mood, or problems with appetite energy or concentration. He related a somewhat tired from his medications and is requesting adjustments. I did explain to him his lithium level came back at the upper limits of normal and thus we would decrease his dose. He has been in the hallway socializing appropriately with staff this morning.  Principal Problem: Schizoaffective disorder, bipolar type Diagnosis:   Patient Active Problem List   Diagnosis Date Noted  . Cannabis use disorder, severe, dependence [F12.20] 11/30/2014  . Schizoaffective disorder, bipolar type [F25.0] 11/30/2014  . Tobacco use disorder [Z72.0] 11/30/2014  . Grief [F43.21] 11/28/2014   Total Time spent with patient: 30 minutes   Past Medical History:  Past Medical History  Diagnosis Date  . None to low serum cortisol response with adrenocorticotrophic hormone (ACTH) stimulation test    History reviewed. No pertinent past surgical history. Family History: History reviewed. No pertinent family history. Social History:  History  Alcohol Use No     History  Drug Use  . Yes  . Special: Marijuana    History   Social History  . Marital Status: Single    Spouse Name: N/A  . Number of Children: N/A  . Years of Education: N/A   Social History Main Topics  . Smoking status: Current Every Day Smoker -- 0.50 packs/day    Types: Cigarettes    Start date: 11/29/2002  . Smokeless tobacco: Not on file  . Alcohol Use: No  . Drug Use: Yes    Special: Marijuana  . Sexual  Activity: Not on file   Other Topics Concern  . None   Social History Narrative   Additional History:    Sleep: Good  Appetite:  Good   Assessment:   Musculoskeletal: Strength & Muscle Tone: within normal limits Gait & Station: normal Patient leans: N/A   Psychiatric Specialty Exam: Physical Exam  Review of Systems  HENT: Negative.   Eyes: Negative.   Respiratory: Negative.   Cardiovascular: Negative.   Gastrointestinal: Negative.   Genitourinary: Negative.   Musculoskeletal: Negative.   Endo/Heme/Allergies: Negative.   Psychiatric/Behavioral: Negative.     Blood pressure 130/85, pulse 81, temperature 98 F (36.7 C), temperature source Oral, resp. rate 20, height 6\' 1"  (1.854 m), weight 87.091 kg (192 lb), SpO2 99 %.Body mass index is 25.34 kg/(m^2).  General Appearance: Well Groomed  Patent attorney::  Good  Speech:  Normal Rate  Volume:  Decreased  Mood:  Euthymic  Affect:  Congruent  Thought Process:  vague  Orientation:  Full (Time, Place, and Person)  Thought Content:  Hallucinations: None  Suicidal Thoughts:  No  Homicidal Thoughts:  No  Memory:  Immediate;   Fair Recent;   Fair Remote;   Fair  Judgement:  Impaired  Insight:  Lacking  Psychomotor Activity:  Decreased  Concentration:  Fair  Recall:  NA  Fund of Knowledge:Fair  Language: Good  Akathisia:  No  Handed:    AIMS (if indicated):     Assets:  Psychologist, counselling Resources/Insurance  ADL's:  Intact  Cognition: WNL  Sleep:  Number of  Hours: 6.75     Current Medications: Current Facility-Administered Medications  Medication Dose Route Frequency Provider Last Rate Last Dose  . acetaminophen (TYLENOL) tablet 650 mg  650 mg Oral Q6H PRN Audery Amel, MD   650 mg at 12/05/14 0435  . alum & mag hydroxide-simeth (MAALOX/MYLANTA) 200-200-20 MG/5ML suspension 30 mL  30 mL Oral Q4H PRN Audery Amel, MD      . amantadine (SYMMETREL) capsule 100 mg  100 mg Oral BID Jimmy Footman, MD   100 mg at 12/09/14 2140  . fluPHENAZine (PROLIXIN) tablet 10 mg  10 mg Oral QHS Jimmy Footman, MD   10 mg at 12/09/14 2140  . fluPHENAZine (PROLIXIN) tablet 5 mg  5 mg Oral Daily Jimmy Footman, MD   5 mg at 12/09/14 1006  . fluPHENAZine decanoate (PROLIXIN) injection 25 mg  25 mg Intramuscular Q14 Days Jimmy Footman, MD   25 mg at 12/05/14 2024  . lamoTRIgine (LAMICTAL) tablet 100 mg  100 mg Oral QHS Jimmy Footman, MD   100 mg at 12/09/14 2140  . lithium carbonate (LITHOBID) CR tablet 300 mg  300 mg Oral QHS Kerin Salen, MD      . lithium carbonate (LITHOBID) CR tablet 600 mg  600 mg Oral q morning - 10a Kerin Salen, MD      . magnesium hydroxide (MILK OF MAGNESIA) suspension 30 mL  30 mL Oral Daily PRN Audery Amel, MD      . nicotine (NICOTROL) 10 MG inhaler 1 continuous puffing  1 continuous puffing Inhalation PRN Jimmy Footman, MD        Lab Results:  Results for orders placed or performed during the hospital encounter of 11/29/14 (from the past 48 hour(s))  Lithium level     Status: None   Collection Time: 12/10/14  6:03 AM  Result Value Ref Range   Lithium Lvl 1.20 0.60 - 1.20 mmol/L    Physical Findings: AIMS:  , ,  ,  ,    CIWA:    COWS:     Treatment Plan Summary: Daily contact with patient to assess and evaluate symptoms and progress in treatment and Medication management   29 year old African-American male with history of schizoaffective disorder bipolar type. Patient was admitted after voicing suicidal ideation and auditory visual hallucinations.  Group home patient has been gone from the group home for a few days and has not received any medications during that time. During the time the patient was absent from the group home he has been using cannabis. At examination patient has euphoric mood, psychomotor agitation was sexually inappropriate and disinhibited.   For  schizoaffective disorder: -Continue Prolixin but due to possible akathisia the dose has been decreased to 5 mg by mouth every morning and 10 mg by mouth daily at bedtime.  Patient received Prolixin Decanoate 25 mg IM on 6-21 -Seroquel has been discontinued due to over sedation -Continue Lamictal but will decrease to 100 mg in order to minimize med regimen.  Plan to taper of lamictal.   -Patient has been started on lithium (6/16). current dose Lithium of  600 mg po bid.  Lithium level this morning was 1.2. We'll decrease his dose to 600 mg in the morning and 300 mg at bedtime.  EPS/akathisia: Patient has been seen pacing the unit up and down. He was started on amantadine 100 mg by mouth twice a day.  Tobacco use disorder: Continue Nicotrol Inhaler when necessary  Metabolic syndrome: I  will order hemoglobin A1c. Lipid panel was checked yesterday only showing mild elevation of LDL.  Labs: Lipid panel, TSH and prolactin level are basically within normal limits.  Precautions continue every 15 minute checks  Hospitalization status: Continue involuntary commitment  Collateral information: Child psychotherapist contacted group home today and patient's ex-ACT team.  They confirm patient does not have a guardian. Patient has not received notice to leave group home and is able to return.    Discharge dispo: Plan to discharge on Monday after checking lithium level.  Medical Decision Making:  Established Problem, Stable/Improving (1)     Anthony Luna 12/10/2014, 9:51 AM

## 2014-12-11 MED ORDER — LITHIUM CARBONATE ER 450 MG PO TBCR: 450.0000 mg | EXTENDED_RELEASE_TABLET | Freq: Two times a day (BID) | ORAL | Status: DC

## 2014-12-11 NOTE — Progress Notes (Signed)
Recreation Therapy Notes  INPATIENT RECREATION TR PLAN  Patient Details Name: Anthony Luna MRN: [id number] DOB: 06/28/1985 Today's Date: 12/11/2014  Rec Therapy Plan Is patient appropriate for Therapeutic Recreation?: Yes Treatment times per week: At least 3 times a week TR Treatment/Interventions: 1:1 session, Group participation (Comment) (Appropriate participation in daily recreation therapy tx)  Discharge Criteria Pt will be discharged from therapy if:: Discharged Treatment plan/goals/alternatives discussed and agreed upon by:: Patient/family  Discharge Summary Short term goals set: See Care Plan Short term goals met: Complete Progress toward goals comments: One-to-one attended Which groups?: Coping skills, Leisure education, Goal setting, Other (Comment) (Self-expression) One-to-one attended: Stress Management Reason goals not met: N/A Therapeutic equipment acquired: None Reason patient discharged from therapy: Discharge from hospital Pt/family agrees with progress & goals achieved: Yes Date patient discharged from therapy: 12/11/14   , M, LRT/CTRS 12/11/2014, 1:55 PM  

## 2014-12-11 NOTE — [facility] Suicide Risk Assessment (Signed)
[facility]   Demographic Factors:  Male  Total Time spent with patient: 30 minutes    Psychiatric Specialty Exam: Physical Exam  ROS                                                         Have you used any form of tobacco in the last 30 days? (Cigarettes, Smokeless Tobacco, Cigars, and/or Pipes): Yes  Has this patient used any form of tobacco in the last 30 days? (Cigarettes, Smokeless Tobacco, Cigars, and/or Pipes) Yes, A prescription for an FDA-approved tobacco cessation medication was offered at discharge and the patient refused  Mental Status Per Nursing Assessment::   On Admission:  Self-harm thoughts  Current Mental Status by Physician: Pleasant, calm, cooperative. No evidence of interaction to internal stimuli, sleeping well, no agitation.  Patient's mood is euthymic, affect reactive and appropriate congruent. Compliant with medications, no evidence of side effects. Hopeful and future oriented  Loss Factors: Loss of significant relationship  Historical Factors: Impulsivity  Risk Reduction Factors:   Living with another person, especially a relative and Positive social support  Continued Clinical Symptoms:  Alcohol/Substance Abuse/Dependencies More than one psychiatric diagnosis Previous Psychiatric Diagnoses and Treatments  Cognitive Features That Contribute To Risk:  Closed-mindedness    Suicide Risk:  Minimal: No identifiable suicidal ideation.  Patients presenting with no risk factors but with morbid ruminations; may be classified as minimal risk based on the severity of the depressive symptoms  Principal Problem: Schizoaffective disorder, bipolar type Discharge Diagnoses:  Patient Active Problem List   Diagnosis Date Noted  . Cannabis use disorder, severe, dependence [F12.20] 11/30/2014  . Schizoaffective disorder, bipolar type [F25.0] 11/30/2014  . Tobacco use disorder [Z72.0] 11/30/2014  . Grief  [F43.21] 11/28/2014    Follow-up Information    [facility].   Why:  Please contact [facility] to coordinate appointment, Staff says they will work Pt in with Dr. Omelia BlackwaterHeaden when Group Home staff available to transport.  Hospital Follow up, Outpatient Medication Management, Therapy   Contact information:   [address] [place], [place] [address] [phone] (OFFICE) [phone] (FAX)       Plan Of Care/Follow-up recommendations:  Other:  Follow-up with outpatient psychiatrist  Is patient on multiple antipsychotic therapies at discharge:  No   Has Patient had three or more failed trials of antipsychotic monotherapy by history:  No  Recommended Plan for Multiple Antipsychotic Therapies: NA    Jimmy FootmanHernandez-Gonzalez,  Ashutosh Dieguez 12/11/2014, 11:17 AM

## 2014-12-11 NOTE — [facility] Suicide Risk Assessment (Signed)
[facility] INPATIENT:  Family/Significant Other Suicide Prevention Education  Suicide Prevention Education:  Education Completed; *Anthony Luna has been identified by the patient as the family member/significant other with whom the patient will be residing, and identified as the person(s) who will aid the patient in the event of a mental health crisis (suicidal ideations/suicide attempt).  With written consent from the patient, the family member/significant other has been provided the following suicide prevention education, prior to the and/or following the discharge of the patient.  The suicide prevention education provided includes the following:  Suicide risk factors  Suicide prevention and interventions  National Suicide Hotline telephone number  [facility] assessment telephone number  [facility] 911  [facility] and/or [facility]idential [facility] telephone number  Request made of family/significant other to:  Remove weapons (e.g., guns, rifles, knives), all items previously/currently identified as safety concern.    Remove drugs/medications (over-the-counter, prescriptions, illicit drugs), all items previously/currently identified as a safety concern.  The family member/significant other verbalizes understanding of the suicide prevention education information provided.  The family member/significant other agrees to remove the items of safety concern listed above.  Anthony Luna, Anthony Luna M 12/11/2014, 9:33 AM

## 2014-12-11 NOTE — Progress Notes (Signed)
Recreation Therapy Notes  Date: 06.27.16 Time: 3:00 pm Location: Craft Room  Group Topic: Wellness  Goal Area(s) Addresses:  Patient will identify at least one item per dimension of health. Patient will examine areas they are deficient.  Behavioral Response: Attentive  Intervention: 6 Dimensions of Health  Activity: Patients were given a worksheet with the definitions of the 6 dimensions of health and instructed to read the worksheet. Patients were given a worksheet with the 6 dimensions on it and instructed to list at least one thing they are currently doing in each category.  Education: LRT educated patient on the 6 dimensions of health   Education Outcome: In group clarification offered  Clinical Observations/Feedback: Patient completed activity by listing at least one thing in 2 categories. Patient did not contribute to group discussion.  Jacquelynn Cree, LRT/CTRS 12/11/2014 4:36 PM

## 2014-12-11 NOTE — Plan of Care (Signed)
Problem: Ineffective individual coping Goal: LTG: Patient will report a decrease in negative feelings Outcome: Progressing Patient reporting his feelings and expressing his needs

## 2014-12-11 NOTE — Progress Notes (Signed)
Pt discharged home. DC instructions provided and explained. Medications reviewed. Rx given. All questions answered. Pt stable at discharge. Denies SI, HI, AVH. 

## 2014-12-11 NOTE — Progress Notes (Signed)
  [facility] :  Will you be returning to the same living situation after discharge:  Yes,    At discharge, do you have transportation home?: Yes,    Do you have the ability to pay for your medications: Yes,     Release of information consent forms completed and in the chart;  Patient's signature needed at discharge.  Patient to Follow up at: Follow-up Information    [facility].   Why:  Please contact [facility] to coordinate appointment, Staff says they will work Pt in with Dr. Omelia BlackwaterHeaden when Group Home staff available to transport.  Hospital Follow up, Outpatient Medication Management, Therapy   Contact information:   [address] [place], [place] [id number] [phone] (OFFICE) [phone] (FAX)       Patient denies SI/HI: Yes,       Safety Planning and Suicide Prevention discussed: Yes,   _1- support and suicide prevention and intervention handout provided  Have you used any form of tobacco in the last 30 days? (Cigarettes, Smokeless Tobacco, Cigars, and/or Pipes): Yes  Has patient been referred to the Quitline?: Patient refused referral  Cheron SchaumannBandi, Aailyah Dunbar M 12/11/2014, 9:33 AM
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
## 2014-12-11 NOTE — Plan of Care (Signed)
Problem: [facility] Participation in Recreation Therapeutic Interventions Goal: STG-Other Recreation Therapy Goal (Specify) STG: Stress management - Within 7 treatment sessions, patient will demonstrate at least one stress management technique in each of 2 treatment sessions to increase stress management skills post d/c.  Outcome: Completed/Met Date Met:  12/11/14 Treatment Session 6; Completed 2 out of 2: At approximately 11:50 am, LRT met with patient in craft room. Patient demonstrated one stress management technique. LRT encouraged patient to continue practicing the stress management techniques.  Leonette Monarch, LRT/CTRS 06.27.16 1:53 pm

## 2014-12-11 NOTE — [facility] Group Notes (Signed)
[facility] Group Notes:  (Nursing/MHT/Case Management/Adjunct)  Date:  12/11/2014  Time:  4:56 AM  Type of Therapy:  Evening Wrap-up Group/Outside  Participation Level:  Active  Participation Quality:  Appropriate  Affect:  Excited  Cognitive:  Alert  Insight:  Good  Engagement in Group:  Engaged  Modes of Intervention:  Activity  Summary of Progress/Problems:  Anthony MorrowChelsea Nanta [name] 12/11/2014, 4:56 AM

## 2014-12-12 NOTE — Progress Notes (Signed)
[facility] H&P Discharge [facility]

## 2014-12-14 ENCOUNTER — Emergency Department
Admission: EM | Admit: 2014-12-14 | Discharge: 2014-12-16 | Disposition: A | Discharge status: Other Acute Inpatient Hospital | Payer: Medicaid Other | Attending: Emergency Medicine | Admitting: Emergency Medicine

## 2014-12-14 ENCOUNTER — Encounter: Payer: Self-pay | Admitting: *Deleted

## 2014-12-14 DIAGNOSIS — F250 Schizoaffective disorder, bipolar type: Secondary | ICD-10-CM | POA: Diagnosis not present

## 2014-12-14 DIAGNOSIS — Z720 Tobacco use: Secondary | ICD-10-CM | POA: Insufficient documentation

## 2014-12-14 DIAGNOSIS — F29 Unspecified psychosis not due to a substance or known physiological condition: Secondary | ICD-10-CM | POA: Diagnosis not present

## 2014-12-14 DIAGNOSIS — F1510 Other stimulant abuse, uncomplicated: Secondary | ICD-10-CM | POA: Insufficient documentation

## 2014-12-14 DIAGNOSIS — F99 Mental disorder, not otherwise specified: Secondary | ICD-10-CM | POA: Diagnosis present

## 2014-12-14 DIAGNOSIS — F209 Schizophrenia, unspecified: Secondary | ICD-10-CM | POA: Insufficient documentation

## 2014-12-14 LAB — URINALYSIS COMPLETE WITH MICROSCOPIC (ARMC ONLY)
BILIRUBIN URINE: NEGATIVE
Glucose, UA: NEGATIVE mg/dL
Hgb urine dipstick: NEGATIVE
Leukocytes, UA: NEGATIVE
NITRITE: NEGATIVE
PH: 6 (ref 5.0–8.0)
Protein, ur: 100 mg/dL — AB
SQUAMOUS EPITHELIAL / LPF: NONE SEEN
Specific Gravity, Urine: 1.034 — ABNORMAL HIGH (ref 1.005–1.030)

## 2014-12-14 LAB — COMPREHENSIVE METABOLIC PANEL
ALT: 16 U/L — ABNORMAL LOW (ref 17–63)
AST: 20 U/L (ref 15–41)
Albumin: 4.4 g/dL (ref 3.5–5.0)
Alkaline Phosphatase: 60 U/L (ref 38–126)
Anion gap: 8 (ref 5–15)
BILIRUBIN TOTAL: 0.8 mg/dL (ref 0.3–1.2)
BUN: 17 mg/dL (ref 6–20)
CO2: 26 mmol/L (ref 22–32)
Calcium: 9.7 mg/dL (ref 8.9–10.3)
Chloride: 106 mmol/L (ref 101–111)
Creatinine, Ser: 1.67 mg/dL — ABNORMAL HIGH (ref 0.61–1.24)
GFR calc Af Amer: >60 mL/min (ref >60)
GFR calc non Af Amer: 54 mL/min — ABNORMAL LOW (ref >60)
Glucose, Bld: 105 mg/dL — ABNORMAL HIGH (ref 65–99)
POTASSIUM: 3.7 mmol/L (ref 3.5–5.1)
SODIUM: 140 mmol/L (ref 135–145)
Total Protein: 7.4 g/dL (ref 6.5–8.1)

## 2014-12-14 LAB — CBC WITH DIFFERENTIAL/PLATELET
BASOS ABS: 0.1 10*3/uL (ref 0–0.1)
Basophils Relative: 1 %
Eosinophils Absolute: 0.2 10*3/uL (ref 0–0.7)
Eosinophils Relative: 2 %
HCT: 45 % (ref 40.0–52.0)
Hemoglobin: 15.4 g/dL (ref 13.0–18.0)
LYMPHS ABS: 1.9 10*3/uL (ref 1.0–3.6)
Lymphocytes Relative: 17 %
MCH: 31.5 pg (ref 26.0–34.0)
MCHC: 34.3 g/dL (ref 32.0–36.0)
MCV: 92 fL (ref 80.0–100.0)
MONOS PCT: 12 %
Monocytes Absolute: 1.3 10*3/uL — ABNORMAL HIGH (ref 0.2–1.0)
Neutro Abs: 7.8 10*3/uL — ABNORMAL HIGH (ref 1.4–6.5)
Neutrophils Relative %: 68 %
Platelets: 234 10*3/uL (ref 150–440)
RBC: 4.9 MIL/uL (ref 4.40–5.90)
RDW: 13.7 % (ref 11.5–14.5)
WBC: 11.4 10*3/uL — ABNORMAL HIGH (ref 3.8–10.6)

## 2014-12-14 LAB — ETHANOL: Alcohol, Ethyl (B): <5 mg/dL (ref <5)

## 2014-12-14 LAB — URINE DRUG SCREEN, QUALITATIVE (ARMC ONLY)
AMPHETAMINES, UR SCREEN: NOT DETECTED
BARBITURATES, UR SCREEN: NOT DETECTED
BENZODIAZEPINE, UR SCRN: NOT DETECTED
Cannabinoid 50 Ng, Ur ~~LOC~~: NOT DETECTED
Cocaine Metabolite,Ur ~~LOC~~: NOT DETECTED
MDMA (ECSTASY) UR SCREEN: NOT DETECTED
METHADONE SCREEN, URINE: NOT DETECTED
OPIATE, UR SCREEN: NOT DETECTED
PHENCYCLIDINE (PCP) UR S: NOT DETECTED
TRICYCLIC, UR SCREEN: POSITIVE — AB

## 2014-12-14 NOTE — ED Notes (Signed)
Pt. To [facility] from ED ambulatory without difficulty, to [facility] 2 . [facility] Pt. Is alert and oriented, warm and dry in no distress. Pt. Denies SI, HI, and AVH. Pt. Calm and cooperative. Pt. Made aware of security cameras and Q15 minute rounds. Pt. Encouraged to let Nursing staff know of any concerns or needs.
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
## 2014-12-15 DIAGNOSIS — F250 Schizoaffective disorder, bipolar type: Secondary | ICD-10-CM

## 2014-12-15 MED ORDER — LORAZEPAM 2 MG PO TABS
2.0000 mg | ORAL_TABLET | Freq: Once | ORAL | Status: AC
  Administered 2014-12-15: 2 mg via ORAL

## 2014-12-15 MED ORDER — LORAZEPAM 2 MG PO TABS
ORAL_TABLET | ORAL | Status: AC
  Administered 2014-12-15: 2 mg via ORAL
  Filled 2014-12-15: qty 1

## 2014-12-15 MED ORDER — OLANZAPINE 10 MG PO TABS
10.0000 mg | ORAL_TABLET | Freq: Every day | ORAL | Status: DC
  Administered 2014-12-15 (×2): 10 mg via ORAL
  Filled 2014-12-15: qty 1

## 2014-12-15 MED ORDER — OLANZAPINE 10 MG PO TABS
ORAL_TABLET | ORAL | Status: AC
  Administered 2014-12-15: 10 mg via ORAL
  Filled 2014-12-15: qty 1
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
## 2014-12-15 NOTE — ED Notes (Signed)
Pt sitting in day room watching tv.  

## 2014-12-15 NOTE — ED Notes (Signed)
BEHAVIORAL HEALTH ROUNDING Patient sleeping: No. Patient alert and oriented: yes Behavior appropriate: No.; If no, describe: pt periodically staring off while looking at the floor or wall Nutrition and fluids offered: Yes  Toileting and hygiene offered: Yes  Sitter present: no Law enforcement present: Yes

## 2014-12-15 NOTE — ED Provider Notes (Signed)
-----------------------------------------   6:16 AM on 12/15/2014 -----------------------------------------   BP 125/77 mmHg  Pulse 89  Temp(Src) 99.6 F (37.6 C) (Oral)  Resp 18  Ht 6\' 1"  (1.854 m)  Wt 197 lb (89.359 kg)  BMI 26.00 kg/m2  SpO2 100%  The patient had no acute events since last update.  Calm and cooperative at this time.  Disposition is pending per Psychiatry/Behavioral Medicine team recommendations.     Irean HongJade J Sung, MD 12/15/14 [phone]

## 2014-12-15 NOTE — ED Notes (Signed)
Pt. Noted in rest room. No complaints or concerns voiced. No distress or abnormal behavior noted. Will continue to monitor with security cameras. Q 15 minute rounds continue.  

## 2014-12-15 NOTE — ED Notes (Signed)
Report received from [facility]. Pt. Alert and witnessed to be standing on his bed during report, RN reported pt has been doing the same periodically throughout the shift. Will continue to monitor for safety via security cameras and Q 15 minute checks.

## 2014-12-15 NOTE — ED Notes (Signed)

## 2014-12-15 NOTE — ED Notes (Signed)
BEHAVIORAL HEALTH ROUNDING Patient sleeping: Yes.   Patient alert and oriented: not applicable Behavior appropriate: Yes.    Nutrition and fluids offered: No Toileting and hygiene offered: No Sitter present: q15 minute observations Law enforcement present: Yes [employer] 

## 2014-12-15 NOTE — ED Notes (Signed)
Sandwich and soft drink given.  

## 2014-12-15 NOTE — ED Notes (Signed)
Pt. Noted in room standing by door. No complaints or concerns voiced. No acute distress noted. Will continue to monitor with security cameras. Q 15 minute rounds continue.

## 2014-12-15 NOTE — ED Notes (Signed)
Pt. Noted in room.  Will continue to monitor with security cameras. Q 15 minute rounds continue.

## 2014-12-15 NOTE — ED Notes (Signed)
Pt in his room observed bending over punching the floor continuously similar to a dog digging a hole. [employer] officer advised pt to stop. Pt is diaphoretic and still appears to be responding to internal stimuli, auditory and visual. MD York CeriseForbach notified of pts behavior and need for medication. Medication administered without incident. Pt standing in the doorway of his room.

## 2014-12-15 NOTE — ED Notes (Addendum)
Dr clapacs here to see pt

## 2014-12-15 NOTE — ED Notes (Addendum)
Pt offered sandwich tray and drink. Pt refused sandwich tray, took graham crackers and drink.

## 2014-12-15 NOTE — ED Notes (Signed)
BEHAVIORAL HEALTH ROUNDING Patient sleeping: No. Patient alert and oriented: yes, to self, situation Behavior appropriate: Yes.  ; If no, describe:  Nutrition and fluids offered: Yes  Toileting and hygiene offered: Yes  Sitter present: no Law enforcement present: Yes, [employer]

## 2014-12-15 NOTE — ED Notes (Signed)
BEHAVIORAL HEALTH ROUNDING Patient sleeping: No. Patient alert and oriented: yes, self, situation Behavior appropriate: Yes.  ; If no, describe:  Nutrition and fluids offered: Yes  Toileting and hygiene offered: Yes  Sitter present: no Law enforcement present: Yes, [employer] 

## 2014-12-15 NOTE — ED Notes (Signed)
BEHAVIORAL HEALTH ROUNDING Patient sleeping: No. Patient alert and oriented: yes, alert oriented to person Behavior appropriate: No.; If no, describe: standing in doorway, pacing room, responding to internal stimuli Nutrition and fluids offered: Yes  Toileting and hygiene offered: Yes  Sitter present: no Law enforcement present: Yes. [employer]

## 2014-12-15 NOTE — ED Notes (Signed)
Pt report received from Myrtha MantisBrandy D RN. Pt care assumed. Pt observed responding to internal stimuli while sitting in day room.

## 2014-12-15 NOTE — ED Notes (Signed)
Pt. Noted in room. No complaints or concerns voiced. No distress or abnormal behavior noted. Will continue to monitor with security cameras. Q 15 minute rounds continue. 

## 2014-12-15 NOTE — ED Notes (Signed)
Pt provided with dinner tray and drink.  
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
## 2014-12-15 NOTE — ED Provider Notes (Signed)
-----------------------------------------   3:43 PM on 12/15/2014 -----------------------------------------  BP 132/76 mmHg  Pulse 87  Temp(Src) 97.8 F (36.6 C) (Axillary)  Resp 18  Ht 6\' 1"  (1.854 m)  Wt 197 lb (89.359 kg)  BMI 26.00 kg/m2  SpO2 97%  [facility]  The patient is acting out, responding to internal stimuli, and punched the floor causing superficial abrasions to his knuckles.  I authorized Zyprexa 10 mg by mouth and Ativan 2 mg by mouth.  If he is not receptive to voluntarily taking medications we will provide Geodon 20 mg IM.   Loleta Roseory Jerilyn Gillaspie, MD 12/15/14 2111

## 2014-12-15 NOTE — ED Notes (Signed)
Ice water given

## 2014-12-15 NOTE — ED Notes (Signed)
Pt. Noted in room.  Will continue to monitor with security cameras. Q 15 minute rounds continue. 

## 2014-12-15 NOTE — ED Notes (Signed)
Pt. Noted sleeping in room. No complaints or concerns voiced. No distress or abnormal behavior noted. Will continue to monitor with security cameras. Q 15 minute rounds continue. 

## 2014-12-15 NOTE — ED Notes (Signed)
ED BHU PLACEMENT JUSTIFICATION Is the patient under IVC or is there intent for IVC: Yes.   Is the patient medically cleared: Yes.   Is there vacancy in the ED BHU: Yes.   Is the population mix appropriate for patient: Yes.   Is the patient awaiting placement in inpatient or outpatient setting: Yes.  beh med admission Has the patient had a psychiatric consult: Yes.   Survey of unit performed for contraband, proper placement and condition of furniture, tampering with fixtures in bathroom, shower, and each patient room: Yes.  ; Findings:  APPEARANCE/BEHAVIOR cooperative NEURO ASSESSMENT Orientation: person, situation  Hallucinations: Yes.  Auditory Hallucinations Speech: Normal Gait: normal RESPIRATORY ASSESSMENT wnl CARDIOVASCULAR ASSESSMENT wnl GASTROINTESTINAL ASSESSMENT wnl EXTREMITIES Moves all extremities  PLAN OF CARE Provide calm/safe environment. Vital signs assessed twice daily. ED BHU Assessment once each 12-hour shift. Collaborate with intake RN daily or as condition indicates. Assure the ED provider has rounded once each shift. Provide and encourage hygiene. Provide redirection as needed. Assess for escalating behavior; address immediately and inform ED provider.  Assess family dynamic and appropriateness for visitation as needed: Yes.  ; If necessary, describe findings:  Educate the patient/family about BHU procedures/visitation: Yes.  ; If necessary, describe findings:

## 2014-12-15 NOTE — ED Notes (Signed)
Pt was observed to be falling asleep in day room recliner. Pt encouraged to go rest in his bed. Pt to his room, lying down resting.

## 2014-12-15 NOTE — ED Notes (Addendum)
ENVIRONMENTAL ASSESSMENT Potentially harmful objects out of patient reach: Yes.   Personal belongings secured: Yes.   Patient dressed in hospital provided attire only: Yes.   Plastic bags out of patient reach: Yes.   Patient care equipment (cords, cables, call bells, lines, and drains) shortened, removed, or accounted for: Yes.   Equipment and supplies removed from bottom of stretcher: Yes.   Potentially toxic materials out of patient reach: Yes.   Sharps container removed or out of patient reach: Yes.    BEHAVIORAL HEALTH ROUNDING Patient sleeping: No. Patient alert and oriented: yes Behavior appropriate: Yes.   Nutrition and fluids offered: Yes  Toileting and hygiene offered: Yes  Sitter present: q15 min observations  Law enforcement present: Yes [employer]  Patient assigned to appropriate care area. Patient oriented to unit/care area: Informed that, for their safety, care areas are designed for safety and monitored by security cameras at all times; and visiting hours explained to patient. Patient verbalizes understanding, and verbal contract for safety obtained.

## 2014-12-15 NOTE — ED Notes (Signed)
BEHAVIORAL HEALTH ROUNDING Patient sleeping: No. Patient alert and oriented: yes Behavior appropriate: Yes.  ; If no, describe:  Nutrition and fluids offered: Yes  Toileting and hygiene offered: Yes  Sitter present: no Law enforcement present: Yes  

## 2014-12-15 NOTE — ED Notes (Signed)
BEHAVIORAL HEALTH ROUNDING Patient sleeping: No. Patient alert and oriented: yes Behavior appropriate: Yes.  ; If no, describe:  Nutrition and fluids offered: Yes  Toileting and hygiene offered: Yes  Sitter present: no Law enforcement present: Yes, [employer] 

## 2014-12-15 NOTE — ED Notes (Signed)
Pt walking around the day room for a few minutes and is now back at his room lying on his bed.

## 2014-12-15 NOTE — ED Notes (Signed)
Pt. Noted in day room. No complaints or concerns voiced. No distress or abnormal behavior noted. Will continue to monitor with security cameras. Q 15 minute rounds continue. 

## 2014-12-16 ENCOUNTER — Inpatient Hospital Stay
Admit: 2014-12-16 | Discharge: 2014-12-25 | DRG: 885 | Disposition: A | Discharge status: Home / Self Care | Payer: Medicaid Other | Attending: Psychiatry | Admitting: Psychiatry

## 2014-12-16 DIAGNOSIS — F17210 Nicotine dependence, cigarettes, uncomplicated: Secondary | ICD-10-CM | POA: Diagnosis present

## 2014-12-16 DIAGNOSIS — G2571 Drug induced akathisia: Secondary | ICD-10-CM | POA: Diagnosis present

## 2014-12-16 DIAGNOSIS — F203 Undifferentiated schizophrenia: Secondary | ICD-10-CM | POA: Diagnosis present

## 2014-12-16 DIAGNOSIS — R45851 Suicidal ideations: Secondary | ICD-10-CM | POA: Diagnosis present

## 2014-12-16 DIAGNOSIS — Z79899 Other long term (current) drug therapy: Secondary | ICD-10-CM | POA: Diagnosis not present

## 2014-12-16 DIAGNOSIS — F250 Schizoaffective disorder, bipolar type: Principal | ICD-10-CM | POA: Diagnosis present

## 2014-12-16 DIAGNOSIS — F1220 Cannabis dependence, uncomplicated: Secondary | ICD-10-CM | POA: Diagnosis present

## 2014-12-16 DIAGNOSIS — F209 Schizophrenia, unspecified: Secondary | ICD-10-CM | POA: Diagnosis present

## 2014-12-16 DIAGNOSIS — F17200 Nicotine dependence, unspecified, uncomplicated: Secondary | ICD-10-CM | POA: Diagnosis present

## 2014-12-16 DIAGNOSIS — E8881 Metabolic syndrome: Secondary | ICD-10-CM | POA: Diagnosis present

## 2014-12-16 DIAGNOSIS — G4700 Insomnia, unspecified: Secondary | ICD-10-CM | POA: Diagnosis present

## 2014-12-16 DIAGNOSIS — R400 Somnolence: Secondary | ICD-10-CM | POA: Diagnosis present

## 2014-12-16 MED ORDER — ACETAMINOPHEN 325 MG PO TABS
650.0000 mg | ORAL_TABLET | Freq: Four times a day (QID) | ORAL | Status: DC | PRN
  Administered 2014-12-17: 650 mg via ORAL
  Filled 2014-12-16: qty 2

## 2014-12-16 MED ORDER — LITHIUM CARBONATE ER 450 MG PO TBCR
450.0000 mg | EXTENDED_RELEASE_TABLET | Freq: Two times a day (BID) | ORAL | Status: DC
  Administered 2014-12-16 – 2014-12-25 (×19): 450 mg via ORAL
  Filled 2014-12-16 (×19): qty 1

## 2014-12-16 MED ORDER — BENZTROPINE MESYLATE 1 MG PO TABS: 1.0000 mg | ORAL_TABLET | Freq: Two times a day (BID) | ORAL | Status: DC | PRN

## 2014-12-16 MED ORDER — FLUPHENAZINE DECANOATE 25 MG/ML IJ SOLN
25.0000 mg | Freq: Once | INTRAMUSCULAR | Status: AC
  Administered 2014-12-16: 25 mg via INTRAMUSCULAR
  Filled 2014-12-16: qty 1

## 2014-12-16 MED ORDER — HYDROXYZINE HCL 25 MG PO TABS: 25.0000 mg | ORAL_TABLET | Freq: Four times a day (QID) | ORAL | Status: DC | PRN

## 2014-12-16 MED ORDER — AMANTADINE HCL 100 MG PO CAPS
100.0000 mg | ORAL_CAPSULE | Freq: Two times a day (BID) | ORAL | Status: DC
  Administered 2014-12-16 – 2014-12-25 (×19): 100 mg via ORAL
  Filled 2014-12-16 (×19): qty 1

## 2014-12-16 MED ORDER — FLUPHENAZINE HCL 5 MG PO TABS
10.0000 mg | ORAL_TABLET | Freq: Two times a day (BID) | ORAL | Status: DC
  Administered 2014-12-16 – 2014-12-25 (×19): 10 mg via ORAL
  Filled 2014-12-16 (×20): qty 2

## 2014-12-16 MED ORDER — TRAZODONE HCL 100 MG PO TABS
100.0000 mg | ORAL_TABLET | Freq: Every day | ORAL | Status: DC
  Administered 2014-12-16: 100 mg via ORAL
  Filled 2014-12-16: qty 1

## 2014-12-16 MED ORDER — MAGNESIUM HYDROXIDE 400 MG/5ML PO SUSP: 30.0000 mL | Freq: Every day | ORAL | Status: DC | PRN

## 2014-12-16 MED ORDER — ALUM & MAG HYDROXIDE-SIMETH 200-200-20 MG/5ML PO SUSP: 30.0000 mL | ORAL | Status: DC | PRN

## 2014-12-16 NOTE — ED Notes (Signed)
BEHAVIORAL HEALTH ROUNDING Patient sleeping: Yes.   Patient alert and oriented: not applicable Behavior appropriate: Yes.    Nutrition and fluids offered: No Toileting and hygiene offered: No Sitter present: q15 minute observations and security camera monitoring Law enforcement present: Yes [employer] 

## 2014-12-16 NOTE — ED Notes (Signed)
BEHAVIORAL HEALTH ROUNDING Patient sleeping: Yes.   Patient alert and oriented: asleep Behavior appropriate: Yes.  ; If no, describe:  Nutrition and fluids offered: asleep Toileting and hygiene offered: asleep Sitter present: no Law enforcement present: Yes, [facility] 

## 2014-12-16 NOTE — [facility] Group Notes (Signed)
[facility] LCSW Group Therapy  12/16/2014 3:14 PM  Type of Therapy:  Group Therapy  Participation Level:  Minimal  Participation Quality:  Drowsy  Affect:  Flat  Cognitive:  Alert  Insight:  Improving  Engagement in Therapy:  Limited  Modes of Intervention:  Education, Exploration, Socialization and Support  Summary of Progress/Problems:Patients were encouraged to define what community means to them. They were encouraged to identify formal and informal support within their community.  Vidit slept throughout most of group. He identified his outpatient provider as a good support for him. They helped him create a resume and is currently helping him look for jobs.   Sempra EnergyCandace L Simon Llamas MSW, LCSWA  12/16/2014, 3:14 PM

## 2014-12-16 NOTE — ED Provider Notes (Signed)
-----------------------------------------   1:17 AM on 12/16/2014 -----------------------------------------  Patient has been admitted to the behavioral health unit.  Anthony HongJade J Janiene Aarons, MD 12/16/14 [phone]

## 2014-12-16 NOTE — [facility] Suicide Risk Assessment (Signed)
[facility]   Nursing information obtained from:    Demographic factors:    Current Mental Status:    Loss Factors:    Historical Factors:    Risk Reduction Factors:    Total Time spent with patient: 1 hour Principal Problem: Undifferentiated schizophrenia Diagnosis:   Patient Active Problem List   Diagnosis Date Noted  . Undifferentiated schizophrenia [F20.3] 12/16/2014  . Cannabis use disorder, severe, dependence [F12.20] 11/30/2014  . Schizoaffective disorder, bipolar type [F25.0] 11/30/2014  . Tobacco use disorder [Z72.0] 11/30/2014  . Grief [F43.21] 11/28/2014     Continued Clinical Symptoms:  Alcohol Use Disorder Identification Test Final Score (AUDIT): 0 The "Alcohol Use Disorders Identification Test", Guidelines for Use in Primary Care, Second Edition.  World Science writerHealth Organization [facility](WHO). Score between 0-7:  no or low risk or alcohol related problems. Score between 8-15:  moderate risk of alcohol related problems. Score between 16-19:  high risk of alcohol related problems. Score 20 or above:  warrants further diagnostic evaluation for alcohol dependence and treatment.   CLINICAL FACTORS:   Schizophrenia:   Depressive state   Musculoskeletal: Strength & Muscle Tone: within normal limits Gait & Station: normal Patient leans: N/A  Psychiatric Specialty Exam: Physical Exam  Nursing note and vitals reviewed. Constitutional: He is oriented to person, place, and time. He appears well-developed and well-nourished.  HENT:  Head: Normocephalic and atraumatic.  Eyes: Conjunctivae and EOM are normal. Pupils are equal, round, and reactive to light.  Neck: Normal range of motion. Neck supple.  Cardiovascular: Normal rate, regular rhythm and normal heart sounds.   Respiratory: Effort normal and breath sounds normal.  GI: Soft. Bowel sounds are normal.  Musculoskeletal: Normal range of motion.  Neurological: He is alert and oriented to person,  place, and time. He has normal reflexes.  Skin: Skin is warm and dry.    Review of Systems  All other systems reviewed and are negative.   Blood pressure 130/85, pulse 77, temperature 98.6 F (37 C), temperature source Oral, resp. rate 20, height 6\' 1"  (1.854 m), weight 83.462 kg (184 lb).Body mass index is 24.28 kg/(m^2).  General Appearance: Disheveled  Eye SolicitorContact::  Fair  Speech:  Slow  Volume:  Normal  Mood:  Dysphoric  Affect:  Depressed  Thought Process:  Linear  Orientation:  Full (Time, Place, and Person)  Thought Content:  Hallucinations: Auditory  Suicidal Thoughts:  No  Homicidal Thoughts:  No  Memory:  Immediate;   Fair Recent;   Fair Remote;   Fair  Judgement:  Fair  Insight:  Fair  Psychomotor Activity:  Normal  Concentration:  Fair  Recall:  FiservFair  Fund of Knowledge:Fair  Language: Fair  Akathisia:  No  Handed:  Right  AIMS (if indicated):     Assets:  Communication Skills Desire for Improvement Financial Resources/Insurance Housing  Sleep:     Cognition: WNL  ADL's:  Intact     COGNITIVE FEATURES THAT CONTRIBUTE TO RISK:  None    SUICIDE RISK:   Mild:  Suicidal ideation of limited frequency, intensity, duration, and specificity.  There are no identifiable plans, no associated intent, mild dysphoria and related symptoms, good self-control (both objective and subjective assessment), few other risk factors, and identifiable protective factors, including available and accessible social support.  PLAN OF CARE: Hospital admission. Medication management. Discharge planning  Medical Decision Making:  New problem, with additional work up planned, Review of Psycho-Social Stressors (1), Review or order clinical lab  tests (1), Review of Medication Regimen & Side Effects (2) and Review of New Medication or Change in Dosage (2)   Anthony Luna is a 29 year old male with history of schizophrenia admitted for threatening behavior at the group home.  1. Agitation.  The patient does not display any unwanted behaviors in the hospital.  2. Mood and psychosis. We continued lithium for mood stabilization and Prolixin for psychosis. A month In was use to prevent EPS.  3. Anxiety. He is on Atarax.  4. Insomnia. Trazodone is available.   5. Disposition. Hopefully he will oh back to his group home. Follow-up with his primary provider in [place].      I certify that inpatient services furnished can reasonably be expected to improve the patient's condition.   Jolanta Pucilowska 12/16/2014, 3:30 PM

## 2014-12-16 NOTE — H&P (Signed)
Psychiatric Admission Assessment Adult  Patient Identification: Anthony Luna MRN:  [id number] Date of Evaluation:  12/16/2014 Chief Complaint:  schizoaffective disorder bipolar type Principal Diagnosis: Undifferentiated schizophrenia Diagnosis:   Patient Active Problem List   Diagnosis Date Noted  . Undifferentiated schizophrenia [F20.3] 12/16/2014  . Cannabis use disorder, severe, dependence [F12.20] 11/30/2014  . Schizoaffective disorder, bipolar type [F25.0] 11/30/2014  . Tobacco use disorder [Z72.0] 11/30/2014  . Grief [F43.21] 11/28/2014   History of Present Illness::   Identifying data. Anthony Luna is a 29 year old male with history of schizophrenia.  Chief complaint. "I got upset."  History of present illness. Anthony Luna has a long history of mental illness with several hospitalization and multiple medication trials. He was discharged from [facility] couple of weeks ago and returned to his group home. He reports that in spite of good treatment compliance he has continuous auditory hallucinations. He was brought back to the emergency room after conflict with his peer at the group home. The patient reports that he was patch on the buttock. It is unclear at this point whether there was aggression from the patient or the peer. It is unclear whether or not the patient can return to the group home the patient denies symptoms of depression or excessive anxiety. He denies using alcohol or illicit substances. He does not really take any responsibilities for the events leading to admission.  Past psychiatric history. Several prior hospitalizations including one at [name] He was also in prison and in mental health than. He denies ever attempting suicide.  Family psychiatric history. Nonreported.  Medical history. None.   social history. He is disabled from mental illness. He lives in a group home. He is a recipient of Medicaid. His family lives  in [place] and reportedly they stay in touch.   ETotal Time spent with patient: 1 hour  Past Medical History:  Past Medical History  Diagnosis Date  . None to low serum cortisol response with adrenocorticotrophic hormone (ACTH) stimulation test    History reviewed. No pertinent past surgical history. Family History: History reviewed. No pertinent family history. Social History:  History  Alcohol Use No     History  Drug Use  . Yes  . Special: Marijuana    History   Social History  . Marital Status: Single    Spouse Name: N/A  . Number of Children: N/A  . Years of Education: N/A   Social History Main Topics  . Smoking status: Current Every Day Smoker -- 0.50 packs/day    Types: Cigarettes    Start date: 11/29/2002  . Smokeless tobacco: Not on file  . Alcohol Use: No  . Drug Use: Yes    Special: Marijuana  . Sexual Activity: Not on file   Other Topics Concern  . None   Social History Narrative   Additional Social History:                          Musculoskeletal: Strength & Muscle Tone: within normal limits Gait & Station: normal Patient leans: N/A  Psychiatric Specialty Exam: Physical Exam  Nursing note and vitals reviewed.   Review of Systems  All other systems reviewed and are negative.   Blood pressure 130/85, pulse 77, temperature 98.6 F (37 C), temperature source Oral, resp. rate 20, height _0  (1.854 m), weight 83.462 kg (184 lb).Body mass index is 24.28 kg/(m^2).  See SRA  Sleep:      Risk to Self: Is patient at risk for suicide?: No Risk to Others:   Prior Inpatient Therapy:   Prior Outpatient Therapy:    Alcohol Screening: 1. How often do you have a drink containing alcohol?: Never 2. How many drinks containing alcohol do you have on a typical day when you are drinking?: 1 or 2 3. How often do you have six or more drinks on one occasion?:  Never Preliminary Score: 0 4. How often during the last year have you found that you were not able to stop drinking once you had started?: Never 5. How often during the last year have you failed to do what was normally expected from you becasue of drinking?: Never 6. How often during the last year have you needed a first drink in the morning to get yourself going after a heavy drinking session?: Never 7. How often during the last year have you had a feeling of guilt of remorse after drinking?: Never 8. How often during the last year have you been unable to remember what happened the night before because you had been drinking?: Never 9. Have you or someone else been injured as a result of your drinking?: No 10. Has a relative or friend or a doctor or another health worker been concerned about your drinking or suggested you cut down?: No Alcohol Use Disorder Identification Test Final Score (AUDIT): 0 Brief Intervention: AUDIT score less than 7 or less-screening does not suggest unhealthy drinking-brief intervention not indicated  Allergies:  No Known Allergies Lab Results:  Results for orders placed or performed during the hospital encounter of 12/14/14 (from the past 48 hour(s))  CBC WITH DIFFERENTIAL     Status: Abnormal   Collection Time: 12/14/14  8:07 PM  Result Value Ref Range   WBC 11.4 (H) 3.8 - 10.6 K/uL   RBC 4.90 4.40 - 5.90 MIL/uL   Hemoglobin 15.4 13.0 - 18.0 g/dL   HCT 45.0 40.0 - 52.0 %   MCV 92.0 80.0 - 100.0 fL   MCH 31.5 26.0 - 34.0 pg   MCHC 34.3 32.0 - 36.0 g/dL   RDW 13.7 11.5 - 14.5 %   Platelets 234 150 - 440 K/uL   Neutrophils Relative % 68 %   Neutro Abs 7.8 (H) 1.4 - 6.5 K/uL   Lymphocytes Relative 17 %   Lymphs Abs 1.9 1.0 - 3.6 K/uL   Monocytes Relative 12 %   Monocytes Absolute 1.3 (H) 0.2 - 1.0 K/uL   Eosinophils Relative 2 %   Eosinophils Absolute 0.2 0 - 0.7 K/uL   Basophils Relative 1 %   Basophils Absolute 0.1 0 - 0.1 K/uL  Comprehensive metabolic  panel     Status: Abnormal   Collection Time: 12/14/14  8:07 PM  Result Value Ref Range   Sodium 140 135 - 145 mmol/L   Potassium 3.7 3.5 - 5.1 mmol/L   Chloride 106 101 - 111 mmol/L   CO2 26 22 - 32 mmol/L   Glucose, Bld 105 (H) 65 - 99 mg/dL   BUN 17 6 - 20 mg/dL   Creatinine, Ser 1.67 (H) 0.61 - 1.24 mg/dL   Calcium 9.7 8.9 - 10.3 mg/dL   Total Protein 7.4 6.5 - 8.1 g/dL   Albumin 4.4 3.5 - 5.0 g/dL   AST 20 15 - 41 U/L   ALT 16 (L) 17 - 63 U/L   Alkaline Phosphatase 60 38 - 126 U/L   Total  Bilirubin 0.8 0.3 - 1.2 mg/dL   GFR calc non Af Amer 54 (L) >60 mL/min   GFR calc Af Amer >60 >60 mL/min    Comment: (NOTE) The eGFR has been calculated using the CKD EPI equation. This calculation has not been validated in all clinical situations. eGFR's persistently <60 mL/min signify possible Chronic Kidney Disease.    Anion gap 8 5 - 15  Ethanol     Status: None   Collection Time: 12/14/14  8:07 PM  Result Value Ref Range   Alcohol, Ethyl (B) <5 <5 mg/dL    Comment:        LOWEST DETECTABLE LIMIT FOR SERUM ALCOHOL IS 5 mg/dL FOR MEDICAL PURPOSES ONLY   Urinalysis complete, with microscopic ([facility] only)     Status: Abnormal   Collection Time: 12/14/14  8:07 PM  Result Value Ref Range   Color, Urine AMBER (A) YELLOW   APPearance CLEAR (A) CLEAR   Glucose, UA NEGATIVE NEGATIVE mg/dL   Bilirubin Urine NEGATIVE NEGATIVE   Ketones, ur TRACE (A) NEGATIVE mg/dL   Specific Gravity, Urine 1.034 (H) 1.005 - 1.030   Hgb urine dipstick NEGATIVE NEGATIVE   pH 6.0 5.0 - 8.0   Protein, ur 100 (A) NEGATIVE mg/dL   Nitrite NEGATIVE NEGATIVE   Leukocytes, UA NEGATIVE NEGATIVE   RBC / HPF 0-5 0 - 5 RBC/hpf   WBC, UA 0-5 0 - 5 WBC/hpf   Bacteria, UA RARE (A) NONE SEEN   Squamous Epithelial / LPF NONE SEEN NONE SEEN   Mucous PRESENT   Urine Drug Screen, Qualitative ([facility] only)     Status: Abnormal   Collection Time: 12/14/14  8:07 PM  Result Value Ref Range   Tricyclic, Ur Screen POSITIVE  (A) NONE DETECTED   Amphetamines, Ur Screen NONE DETECTED NONE DETECTED   MDMA (Ecstasy)Ur Screen NONE DETECTED NONE DETECTED   Cocaine Metabolite,Ur [place] NONE DETECTED NONE DETECTED   Opiate, Ur Screen NONE DETECTED NONE DETECTED   Phencyclidine (PCP) Ur S NONE DETECTED NONE DETECTED   Cannabinoid 50 Ng, Ur [place] NONE DETECTED NONE DETECTED   Barbiturates, Ur Screen NONE DETECTED NONE DETECTED   Benzodiazepine, Ur Scrn NONE DETECTED NONE DETECTED   Methadone Scn, Ur NONE DETECTED NONE DETECTED    Comment: (NOTE) 448  Tricyclics, urine               Cutoff 1000 ng/mL 200  Amphetamines, urine             Cutoff 1000 ng/mL 300  MDMA (Ecstasy), urine           Cutoff 500 ng/mL 400  Cocaine Metabolite, urine       Cutoff 300 ng/mL 500  Opiate, urine                   Cutoff 300 ng/mL 600  Phencyclidine (PCP), urine      Cutoff 25 ng/mL 700  Cannabinoid, urine              Cutoff 50 ng/mL 800  Barbiturates, urine             Cutoff 200 ng/mL 900  Benzodiazepine, urine           Cutoff 200 ng/mL 1000 Methadone, urine                Cutoff 300 ng/mL 1100 1200 The urine drug screen provides only a preliminary, unconfirmed 1300 analytical test result and should not be used  for non-medical 1400 purposes. Clinical consideration and professional judgment should 1500 be applied to any positive drug screen result due to possible 1600 interfering substances. A more specific alternate chemical method 1700 must be used in order to obtain a confirmed analytical result.  1800 Gas chromato graphy / mass spectrometry (GC/MS) is the preferred 1900 confirmatory method.    Current Medications: Current Facility-Administered Medications  Medication Dose Route Frequency Provider Last Rate Last Dose  . acetaminophen (TYLENOL) tablet 650 mg  650 mg Oral Q6H PRN Gonzella Lex, MD      . alum & mag hydroxide-simeth (MAALOX/MYLANTA) 200-200-20 MG/5ML suspension 30 mL  30 mL Oral Q4H PRN Gonzella Lex, MD      .  amantadine (SYMMETREL) capsule 100 mg  100 mg Oral BID Gonzella Lex, MD   100 mg at 12/16/14 1006  . benztropine (COGENTIN) tablet 1 mg  1 mg Oral BID PRN Gonzella Lex, MD      . fluPHENAZine (PROLIXIN) tablet 10 mg  10 mg Oral BID AC Gonzella Lex, MD   10 mg at 12/16/14 0818  . hydrOXYzine (ATARAX/VISTARIL) tablet 25 mg  25 mg Oral Q6H PRN Gonzella Lex, MD      . lithium carbonate (ESKALITH) CR tablet 450 mg  450 mg Oral Q12H Gonzella Lex, MD   450 mg at 12/16/14 1009  . magnesium hydroxide (MILK OF MAGNESIA) suspension 30 mL  30 mL Oral Daily PRN Gonzella Lex, MD       PTA Medications: Prescriptions prior to admission  Medication Sig Dispense Refill Last Dose  . amantadine (SYMMETREL) 100 MG capsule Take 1 capsule (100 mg total) by mouth 2 (two) times daily. 60 capsule 0 12/14/2014 at 0800  . fluPHENAZine (PROLIXIN) 10 MG tablet Take 1 tablet (10 mg total) by mouth at bedtime. 30 tablet 0 unknown  . fluPHENAZine (PROLIXIN) 5 MG tablet Take 1 tablet (5 mg total) by mouth daily. 30 tablet 0 12/14/2014 at 0800  . [START ON 12/19/2014] fluPHENAZine decanoate (PROLIXIN) 25 MG/ML injection Inject 1 mL (25 mg total) into the muscle every 14 (fourteen) days. 5 mL 0 12/05/2014  . lithium carbonate (ESKALITH) 450 MG CR tablet Take 1 tablet (450 mg total) by mouth every 12 (twelve) hours. 60 tablet 0 12/14/2014 at 0800    Previous Psychotropic Medications: Yes   Substance Abuse History in the last 12 months:  Yes.      Consequences of Substance Abuse: Negative  Results for orders placed or performed during the hospital encounter of 12/14/14 (from the past 72 hour(s))  CBC WITH DIFFERENTIAL     Status: Abnormal   Collection Time: 12/14/14  8:07 PM  Result Value Ref Range   WBC 11.4 (H) 3.8 - 10.6 K/uL   RBC 4.90 4.40 - 5.90 MIL/uL   Hemoglobin 15.4 13.0 - 18.0 g/dL   HCT 45.0 40.0 - 52.0 %   MCV 92.0 80.0 - 100.0 fL   MCH 31.5 26.0 - 34.0 pg   MCHC 34.3 32.0 - 36.0 g/dL   RDW 13.7  11.5 - 14.5 %   Platelets 234 150 - 440 K/uL   Neutrophils Relative % 68 %   Neutro Abs 7.8 (H) 1.4 - 6.5 K/uL   Lymphocytes Relative 17 %   Lymphs Abs 1.9 1.0 - 3.6 K/uL   Monocytes Relative 12 %   Monocytes Absolute 1.3 (H) 0.2 - 1.0 K/uL   Eosinophils Relative 2 %  Eosinophils Absolute 0.2 0 - 0.7 K/uL   Basophils Relative 1 %   Basophils Absolute 0.1 0 - 0.1 K/uL  Comprehensive metabolic panel     Status: Abnormal   Collection Time: 12/14/14  8:07 PM  Result Value Ref Range   Sodium 140 135 - 145 mmol/L   Potassium 3.7 3.5 - 5.1 mmol/L   Chloride 106 101 - 111 mmol/L   CO2 26 22 - 32 mmol/L   Glucose, Bld 105 (H) 65 - 99 mg/dL   BUN 17 6 - 20 mg/dL   Creatinine, Ser 1.67 (H) 0.61 - 1.24 mg/dL   Calcium 9.7 8.9 - 10.3 mg/dL   Total Protein 7.4 6.5 - 8.1 g/dL   Albumin 4.4 3.5 - 5.0 g/dL   AST 20 15 - 41 U/L   ALT 16 (L) 17 - 63 U/L   Alkaline Phosphatase 60 38 - 126 U/L   Total Bilirubin 0.8 0.3 - 1.2 mg/dL   GFR calc non Af Amer 54 (L) >60 mL/min   GFR calc Af Amer >60 >60 mL/min    Comment: (NOTE) The eGFR has been calculated using the CKD EPI equation. This calculation has not been validated in all clinical situations. eGFR's persistently <60 mL/min signify possible Chronic Kidney Disease.    Anion gap 8 5 - 15  Ethanol     Status: None   Collection Time: 12/14/14  8:07 PM  Result Value Ref Range   Alcohol, Ethyl (B) <5 <5 mg/dL    Comment:        LOWEST DETECTABLE LIMIT FOR SERUM ALCOHOL IS 5 mg/dL FOR MEDICAL PURPOSES ONLY   Urinalysis complete, with microscopic ([facility] only)     Status: Abnormal   Collection Time: 12/14/14  8:07 PM  Result Value Ref Range   Color, Urine AMBER (A) YELLOW   APPearance CLEAR (A) CLEAR   Glucose, UA NEGATIVE NEGATIVE mg/dL   Bilirubin Urine NEGATIVE NEGATIVE   Ketones, ur TRACE (A) NEGATIVE mg/dL   Specific Gravity, Urine 1.034 (H) 1.005 - 1.030   Hgb urine dipstick NEGATIVE NEGATIVE   pH 6.0 5.0 - 8.0   Protein, ur 100  (A) NEGATIVE mg/dL   Nitrite NEGATIVE NEGATIVE   Leukocytes, UA NEGATIVE NEGATIVE   RBC / HPF 0-5 0 - 5 RBC/hpf   WBC, UA 0-5 0 - 5 WBC/hpf   Bacteria, UA RARE (A) NONE SEEN   Squamous Epithelial / LPF NONE SEEN NONE SEEN   Mucous PRESENT   Urine Drug Screen, Qualitative ([facility] only)     Status: Abnormal   Collection Time: 12/14/14  8:07 PM  Result Value Ref Range   Tricyclic, Ur Screen POSITIVE (A) NONE DETECTED   Amphetamines, Ur Screen NONE DETECTED NONE DETECTED   MDMA (Ecstasy)Ur Screen NONE DETECTED NONE DETECTED   Cocaine Metabolite,Ur [place] NONE DETECTED NONE DETECTED   Opiate, Ur Screen NONE DETECTED NONE DETECTED   Phencyclidine (PCP) Ur S NONE DETECTED NONE DETECTED   Cannabinoid 50 Ng, Ur [place] NONE DETECTED NONE DETECTED   Barbiturates, Ur Screen NONE DETECTED NONE DETECTED   Benzodiazepine, Ur Scrn NONE DETECTED NONE DETECTED   Methadone Scn, Ur NONE DETECTED NONE DETECTED    Comment: (NOTE) 937  Tricyclics, urine               Cutoff 1000 ng/mL 200  Amphetamines, urine             Cutoff 1000 ng/mL 300  MDMA (Ecstasy), urine  Cutoff 500 ng/mL 400  Cocaine Metabolite, urine       Cutoff 300 ng/mL 500  Opiate, urine                   Cutoff 300 ng/mL 600  Phencyclidine (PCP), urine      Cutoff 25 ng/mL 700  Cannabinoid, urine              Cutoff 50 ng/mL 800  Barbiturates, urine             Cutoff 200 ng/mL 900  Benzodiazepine, urine           Cutoff 200 ng/mL 1000 Methadone, urine                Cutoff 300 ng/mL 1100 1200 The urine drug screen provides only a preliminary, unconfirmed 1300 analytical test result and should not be used for non-medical 1400 purposes. Clinical consideration and professional judgment should 1500 be applied to any positive drug screen result due to possible 1600 interfering substances. A more specific alternate chemical method 1700 must be used in order to obtain a confirmed analytical result.  1800 Gas chromato graphy / mass  spectrometry (GC/MS) is the preferred 1900 confirmatory method.     Observation Level/Precautions:  15 minute checks  Laboratory:  CBC Chemistry Profile UDS UA  Psychotherapy:    Medications:    Consultations:    Discharge Concerns:    Estimated LOS:  Other:     Psychological Evaluations: No   Treatment Plan Summary: Daily contact with patient to assess and evaluate symptoms and progress in treatment and Medication management  Medical Decision Making:  New problem, with additional work up planned, Review of Psycho-Social Stressors (1), Review or order clinical lab tests (1), Review of Medication Regimen & Side Effects (2) and Review of New Medication or Change in Dosage (2)   Anthony Luna is a 29 year old male with history of schizophrenia admitted for threatening behavior at the group home.  1. Agitation. The patient does not display any unwanted behaviors in the hospital.  2. Mood and psychosis. We continued lithium for mood stabilization and Prolixin for psychosis. A month In was use to prevent EPS.  3. Anxiety. He is on Atarax.  4. Insomnia. Trazodone is available.   5. Disposition. Hopefully he will oh back to his group home. Follow-up with his primary provider in [place].    I certify that inpatient services furnished can reasonably be expected to improve the patient's condition.   Lulu Hirschmann 7/2/20163:36 PM

## 2014-12-16 NOTE — ED Notes (Signed)
Pt resting in bed with eyes closed. No unusual behavior observed. Pt has no needs or concerns at this time. Will continue to monitor and f/u as needed.  
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
## 2014-12-16 NOTE — Tx Team (Signed)
Initial Interdisciplinary Treatment Plan   PATIENT STRESSORS: Legal issue Medication change or noncompliance Substance abuse   PATIENT STRENGTHS: Motivation for treatment/growth Physical Health   PROBLEM LIST: Problem List/Patient Goals Date to be addressed Date deferred Reason deferred Estimated date of resolution  Homicidal ideation 12/16/2014     Substance abuse 12/16/2014                                                DISCHARGE CRITERIA:  Improved stabilization in mood, thinking, and/or behavior Reduction of life-threatening or endangering symptoms to within safe limits Verbal commitment to aftercare and medication compliance  PRELIMINARY DISCHARGE PLAN: Attend PHP/IOP Return to previous living arrangement  PATIENT/FAMIILY INVOLVEMENT: This treatment plan has been presented to and reviewed with the patient, Doree BarthelBrandon Wayne Dollens, and/or family member, The patient and family have been given the opportunity to ask questions and make suggestions.  Margo CommonGigi George Lorrane Mccay 12/16/2014, 10:49 AM

## 2014-12-16 NOTE — BHH Group Notes (Signed)
[facility] Group Notes:  (Nursing/MHT/Case Management/Adjunct)  Date:  12/16/2014  Time:  11:55 PM  Type of Therapy:  Group Therapy  Participation Level:  Minimal  Participation Quality:  Appropriate  Affect:  Flat  Cognitive:  Lacking  Insight:  Limited  Engagement in Group:  Limited  Modes of Intervention:  n/a  Summary of Progress/Problems:  Anthony Luna 12/16/2014, 11:55 PM

## 2014-12-16 NOTE — Progress Notes (Signed)
Pt came to the unit at 0545. Calm and cooperative with staff. Skin assessment and search completed. No contraband found. Pt has tattoo to left upper arm. No rashes, open areas, or bruises noted. Patient is responding to internal stimuli. Will cont to monitor per protocol of continuity of care and safety.

## 2014-12-16 NOTE — ED Notes (Signed)
BEHAVIORAL HEALTH ROUNDING Patient sleeping: No. Patient alert and oriented: yes, self, situation Behavior appropriate: Yes.  ; If no, describe:  Nutrition and fluids offered: Yes  Toileting and hygiene offered: Yes  Sitter present: no Law enforcement present: Yes, ODS

## 2014-12-16 NOTE — Progress Notes (Addendum)
Patient with a flat affect & stares at the nurses station.He was irritable this morning then came to staff & apologized for being rude.Hesitant to talk to staff.Positive for auditory hallucination at times.Visible in the milieu but minimal interaction with peers.Little reluctant to take medicines this morning.Attended groups.Appetite good.

## 2014-12-16 NOTE — ED Notes (Signed)
Pt awake pacing room with blanket over shoulders, then laying blanket on bed, pt intermittently opening and closing door.

## 2014-12-16 NOTE — Plan of Care (Signed)
Problem: Ineffective individual coping Goal: STG: Patient will remain free from self harm Outcome: Progressing Patient contracts for safety.

## 2014-12-17 LAB — LITHIUM LEVEL: Lithium Lvl: 0.72 mmol/L (ref 0.60–1.20)

## 2014-12-17 MED ORDER — TEMAZEPAM 15 MG PO CAPS
15.0000 mg | ORAL_CAPSULE | Freq: Every evening | ORAL | Status: DC | PRN
Start: 2014-12-17 — End: 2014-12-19
  Administered 2014-12-18: 15 mg via ORAL
  Filled 2014-12-17: qty 1

## 2014-12-17 MED ORDER — FLUPHENAZINE DECANOATE 25 MG/ML IJ SOLN
50.0000 mg | INTRAMUSCULAR | Status: DC
Start: 2014-12-19 — End: 2014-12-25
  Administered 2014-12-19: 50 mg via INTRAMUSCULAR
  Filled 2014-12-17 (×3): qty 2

## 2014-12-17 NOTE — Progress Notes (Signed)
Patient was alert today.  Was medication compliant.  Affect blunted. Was polite and redirectable.Attended groups.   Will cont to monitor for safety.

## 2014-12-17 NOTE — BHH Counselor (Addendum)
Authorization Request for Psych Inpt. Treatment from [employer] completed and submitted. [id number].

## 2014-12-17 NOTE — B[facility] Group Notes (Signed)
[facility] Group Notes:  (Nursing/MHT/Case Management/Adjunct)  Date:  12/17/2014  Time:  11:56 PM  Type of Therapy:  Group Therapy  Participation Level:  None  Participation Quality:  did not participate  Affect:  Appropriate  Cognitive:  Appropriate  Insight:  Appropriate  Engagement in Group:  None  Modes of Intervention:  Discussion  Summary of Progress/Problems:  Somer Trotter Joy Sacha Topor 12/17/2014, 11:56 PM

## 2014-12-17 NOTE — BHH Group Notes (Signed)
B[facility] LCSW Group Therapy  12/17/2014 3:35 PM  Type of Therapy:  Group Therapy  Participation Level:  Minimal  Participation Quality:  Attentive  Affect:  Flat  Cognitive:  Alert  Insight:  Limited  Engagement in Therapy:  Limited  Modes of Intervention:  Discussion, Education, Role-play, Socialization and Support  Summary of Progress/Problems:Positive thoughts: Pt was encouraged to identify distorted thoughts. They will discuss the emotions and behaviors influenced by these thoughts. Pt will discuss ways to recognize distorted thoughts and how to refute them. Anthony Luna states he is doing better today. He states he slept last night and is not experiencing hallucinations. However, it was reported that he did not sleep last night. He sat quietly and listened to others.   Anthony Luna MSW, LCSWA  12/17/2014, 3:35 PM
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
## 2014-12-17 NOTE — Progress Notes (Signed)
Pt  Visible on the unit. Little interaction noted with peers. States the voices still present. Felt meds not working so came back to hospital. Pt attended group. Med compliant.

## 2014-12-18 NOTE — Progress Notes (Signed)
Patient was alert today. Was medication compliant. Affect blunted. Was polite and redirectable. He was noted to have been pacing in the halls this afternoon.  He did Attended groups. Will cont to monitor for safety.

## 2014-12-18 NOTE — [facility] Group Notes (Signed)
[facility]  12/18/2014 2:01 PM  Type of Therapy:  Group Therapy  Participation Level:  Did Not Attend   Lulu Ridingngle, Emet Rafanan T, MSW, LCSWA 12/18/2014, 2:01 PM

## 2014-12-18 NOTE — Progress Notes (Signed)
Pt visible on the unit. Pacing at times. Pt states when he hears the voices he paces or taps his thumb on his forefinger. Pt attended group. Affect more animated. Med compliant.

## 2014-12-18 NOTE — BHH Group Notes (Signed)
[facility] Group Notes:  (Nursing/MHT/Case Management/Adjunct)  Date:  12/18/2014  Time:  10:20 PM  Type of Therapy:  Psychoeducational Skills  Participation Level:  Active   Participation Quality:  Appropriate, Attentive and Sharing  Affect:  Appropriate  Cognitive:  Alert, Appropriate and Oriented  Insight:  Good  Engagement in Group:  Engaged  Modes of Intervention:  Discussion and Exploration  Summary of Progress/Problems:  Anthony Luna R Anthony Luna 12/18/2014, 10:20 PM
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
## 2014-12-19 DIAGNOSIS — F250 Schizoaffective disorder, bipolar type: Principal | ICD-10-CM

## 2014-12-19 LAB — TSH: TSH: 1.312 u[IU]/mL (ref 0.350–4.500)

## 2014-12-19 LAB — LIPID PANEL
CHOLESTEROL: 147 mg/dL (ref 0–200)
HDL: 44 mg/dL (ref >40)
LDL Cholesterol: 78 mg/dL (ref 0–99)
TRIGLYCERIDES: 127 mg/dL (ref <150)
Total CHOL/HDL Ratio: 3.3 RATIO
VLDL: 25 mg/dL (ref 0–40)

## 2014-12-19 LAB — HEMOGLOBIN A1C: HEMOGLOBIN A1C: 5.1 % (ref 4.0–6.0)

## 2014-12-19 MED ORDER — CLONAZEPAM 1 MG PO TABS
1.0000 mg | ORAL_TABLET | Freq: Every day | ORAL | Status: DC
  Administered 2014-12-19: 1 mg via ORAL
  Filled 2014-12-19: qty 1
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
## 2014-12-19 NOTE — [facility] Group Notes (Signed)
[facility] Group Notes:  (Nursing/MHT/Case Management/Adjunct)  Date:  12/19/2014  Time:  9:55 PM  Type of Therapy:  Group Therapy  Participation Level:  None  Participation Quality:  Appropriate  Affect:  Appropriate  Cognitive:  Alert  Insight:  Appropriate  Engagement in Group:  None  Modes of Intervention:  Discussion  Summary of Progress/Problems:  Novie Maggio Joy Vershawn Westrup 12/19/2014, 9:55 PM

## 2014-12-19 NOTE — Progress Notes (Signed)
D: Pt is awake and active in the milieu this evening. Pt mood is euthymic and his affect is bright. Pt is attending groups and is pleasant and cooperative with staff.   A: Writer provided emotional support and administered medications as prescribed. Writer discussed indications for Prolixin Decanoate and administered IM.   R: Pt was concerned because Haldol has caused side effects in the past such as hallucinations as it wore off, according to the pt. However, he is complaint and accepted new regimen and education. Pt went to bed shortly after snack.

## 2014-12-19 NOTE — [facility] Group Notes (Signed)
[facility] Group Notes:  (Nursing/MHT/Case Management/Adjunct)  Date:  12/19/2014  Time:  12:12 PM  Type of Therapy:  Psychoeducational Skills  Participation Level:  Did Not Attend  ummary of Progress/Problems:  Anthony Luna 12/19/2014, 12:12 PM

## 2014-12-19 NOTE — Plan of Care (Signed)
Problem: Ineffective individual coping Goal: STG: Patient will remain free from self harm Outcome: Progressing No self harm reported or observed Goal: STG-Increase in ability to manage activities of daily living Outcome: Progressing Able to manage ADLS

## 2014-12-19 NOTE — Progress Notes (Signed)
D: Pt is awake and active in the milieu this evening. Pt mood is euthymic and his affect is bright and bizarre. Pt is somewhat childlike but is pleasant and cooperative with staff.   A: Writer provided emotional support and encouraged him to focus on learning how to better cope with his emotions going forward.   R: Pt stated that he "scared himself" when he lost control in the ED and began punching the floor. Pt admits he did not want to do it but was unable to stop himself. Pt insight is improving and he is pleasant and cooperative with staff and receptive to input.

## 2014-12-19 NOTE — Progress Notes (Signed)
D: Pt is awake and active in the milieu this afternoon but slept most of morning.  Pt mood is euthymic and his affect is bright and bizarre. Pt is somewhat childlike but is pleasant and cooperative with staff.   A: Writer provided emotional support and encouraged him to find ways to cope with angered..   R:. Pt is pleasant and cooperative with staff and receptive to input.

## 2014-12-20 LAB — PROLACTIN: Prolactin: 6.1 ng/mL (ref 4.0–15.2)

## 2014-12-20 MED ORDER — CLONAZEPAM 0.5 MG PO TABS
0.5000 mg | ORAL_TABLET | Freq: Every day | ORAL | Status: DC
  Administered 2014-12-20 – 2014-12-24 (×5): 0.5 mg via ORAL
  Filled 2014-12-20 (×5): qty 1

## 2014-12-20 NOTE — [facility] Group Notes (Signed)
[facility] Note  12/20/2014 10:18 AM  Participation Quality:  Appropriate and Attentive  Affect:  Flat  Cognitive:  Alert and Appropriate  Insight:  Improving  Engagement in Group:  Improving  Modes of Intervention:  Socialization and Support  Summary of Progress/Problems: Patient attended and participated in group discussion appropriately. Patient shared that his SMART goal is to "continue my meds, stay focused, stay off drugs and alcohol and continue education".   Lulu RidingIngle, Lakeia Bradshaw T, MSW, LCSWA 12/20/2014, 10:18 AM
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
## 2014-12-20 NOTE — Plan of Care (Signed)
Problem: Consults [facility] Patient Education Outcome: Progressing Patient cooperative with meds as ordered and encouraged to attend therapy groups to learn and initiate coping skills for management of stressors and diagnosis.
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
## 2014-12-20 NOTE — Progress Notes (Signed)
Patient with sad affect and cooperative behavior with meals, meds and plan of care. No SI/HI/AVH at this time. Good appetite and good adls. Quiet with peers, verbalizes needs appropriately with staff. Encouraged to attend therapy groups to learn and initiate coping for management of stressors and diagnosis. Safety maintained.

## 2014-12-20 NOTE — Progress Notes (Signed)
D: Pt is awake and active in the milieu this evening. Pt mood is euthymic and his affect is bright. Pt denies SI/HI but occassionaly hears voices.   A: Writer provided emotional support and encouraged pt to continue following his medication regimen after discharge.   R: Pt is pleasant and cooperative with staff and interacting appropriately with peers, playing cards in the day room. He went to bed shortly after medication administration.

## 2014-12-20 NOTE — [facility] Group Notes (Signed)
[facility] Group Notes:  (Nursing/MHT/Case Management/Adjunct)  Date:  12/20/2014  Time:  12:24 PM  Type of Therapy:  Group Therapy  Participation Level:  Active  Participation Quality:  Appropriate  Affect:  Appropriate  Cognitive:  Appropriate  Insight:  Good  Engagement in Group:  Engaged  Modes of Intervention:  Activity  Summary of Progress/Problems:  Mayra NeerJackie L Merril Isakson 12/20/2014, 12:24 PM

## 2014-12-20 NOTE — [facility] Group Notes (Signed)
[facility] LCSW Group Therapy  12/20/2014 4:01 PM  Type of Therapy:  Group Therapy  Participation Level:  Did Not Attend  Participation Quality:     Affect:     Cognitive:     Insight:     Engagement in Therapy:     Modes of Intervention:     Summary of Progress/Problems:  Jake SharkLaws, Aniylah Avans PMSW, LCSW 12/20/2014, 4:01 PM

## 2014-12-21 NOTE — Plan of Care (Signed)
Problem: Ineffective individual coping Goal: LTG: Patient will report a decrease in negative feelings Outcome: Progressing States he feels better and has a bright affect.  Goal: STG: Patient will remain free from self harm Outcome: Progressing No self-harm.  Goal: STG:Pt. will utilize relaxation techniques to reduce stress STG: Patient will utilize relaxation techniques to reduce stress levels  Outcome: Progressing Pleasant and cooperative. Relaxing in the milieu, utilizing socialization.

## 2014-12-21 NOTE — Progress Notes (Signed)
Patient denies complaints today. Denies SI/HI/AVH. Affect flat and he has been pacing at times. Guarded emotionally. Otherwise nicely groomed and pleasant with staff and peers. Continue to monitor.

## 2014-12-21 NOTE — [facility] Group Notes (Signed)
[facility] Group Notes:  (Nursing/MHT/Case Management/Adjunct)  Date:  12/21/2014  Time:  1:44 PM  Type of Therapy:  Group Therapy  Participation Level:  Did Not Attend  Summary of Progress/Problems:  Anthony Luna 12/21/2014, 1:44 PM
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
## 2014-12-21 NOTE — [facility] Group Notes (Signed)
[facility] LCSW Group Therapy  12/21/2014 4:04 PM  Type of Therapy:  Group Therapy  Participation Level:  Minimal  Participation Quality:  Appropriate and Attentive  Affect:  Flat  Cognitive:  Alert and Oriented  Insight:  Improving  Engagement in Therapy:  Lacking  Modes of Intervention:  Socialization and Support  Summary of Progress/Problems: Patient attended group but only participated when asked questions by the facilitator. Patient was attentive AEB his body language and eye contact.   Lulu RidingIngle, Mckay Tegtmeyer T, MSW, LCSWA 12/21/2014, 4:04 PM

## 2014-12-21 NOTE — Plan of Care (Signed)
Problem: Alteration in thought process Goal: LTG-Patient behavior demonstrates decreased signs psychosis (Patient behavior demonstrates decreased signs of psychosis to the point the patient is safe to return home and continue treatment in an outpatient setting.)  Outcome: Progressing Patient reports improvement in A/VH.

## 2014-12-21 NOTE — [facility] Group Notes (Signed)
[facility] Group Notes:  (Nursing/MHT/Case Management/Adjunct)  Date:  12/21/2014  Time:  9:23 PM  Type of Therapy:  Group Therapy  Participation Level:  Did Not Attend   Veva Holesshley Imani Deeandra Jerry 12/21/2014, 9:23 PM

## 2014-12-21 NOTE — [facility] Group Notes (Signed)
[facility] Group Notes:  (Nursing/MHT/Case Management/Adjunct)  Date:  12/21/2014  Time:  8:51 AM  Type of Therapy:  Goals  Participation Level:  Did Not Attend  Anthony Luna 12/21/2014, 8:51 AM 

## 2014-12-22 LAB — LITHIUM LEVEL: Lithium Lvl: 0.72 mmol/L (ref 0.60–1.20)

## 2014-12-22 MED ORDER — FLUPHENAZINE HCL 10 MG PO TABS: 10.0000 mg | ORAL_TABLET | Freq: Two times a day (BID) | ORAL | Status: DC

## 2014-12-22 MED ORDER — FLUPHENAZINE DECANOATE 25 MG/ML IJ SOLN: 50.0000 mg | INTRAMUSCULAR | Status: DC

## 2014-12-22 MED ORDER — CLONAZEPAM 0.5 MG PO TABS: 0.5000 mg | ORAL_TABLET | Freq: Every day | ORAL | Status: DC
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
## 2014-12-22 NOTE — [facility] Group Notes (Signed)
[facility] Group Notes:  (Nursing/MHT/Case Management/Adjunct)  Date:  12/22/2014  Time:  11:31 AM  Type of Therapy:  Movement Therapy  Participation Level:  Active  Participation Quality:  Appropriate and Supportive  Affect:  Appropriate  Cognitive:  Alert, Appropriate and Oriented  Insight:  Appropriate  Engagement in Group:  Engaged  Modes of Intervention:  Activity  Summary of Progress/Problems:  [name] 12/22/2014, 11:31 AM

## 2014-12-22 NOTE — Progress Notes (Signed)
NUTRITION NOTE:   Anthony Luna is a 29 y.o. male with Schizoaffective disorder, bipolar type  PMH:  Past Medical History  Diagnosis Date  . None to low serum cortisol response with adrenocorticotrophic hormone (ACTH) stimulation test     Diet Order: Regular  Current Nutrition: recorded po intake 0-100% of meals, pt eating mostly 100% at meal times; appetite good per report  Anthropometrics:  Body mass index is 24.28 kg/(m^2).   Filed Weights   12/16/14 0556  Weight: 184 lb (83.462 kg)   Labs: reviewed  Pt is at no nutrition risk due to BMI of 24.28, regular diet order, eating mostly 100% of meals with reported good appetite. No nutrition intervention warranted at this time. Will sign off. Please re-consult RD if nutritional issues arise.   Romelle Starcherate Delynn Pursley MS, RD, LDN [phone] Pager

## 2014-12-22 NOTE — [facility] Group Notes (Signed)
[facility] LCSW Group Therapy  12/22/2014 3:17 PM  Type of Therapy:  Group Therapy  Participation Level:  Minimal  Participation Quality:  Attentive  Affect:  Flat  Cognitive:  Alert  Insight:  Limited  Engagement in Therapy:  Limited  Modes of Intervention:  Discussion, Education, Socialization and Support  Summary of Progress/Problems:Feelings around Relapse. Group members discussed the meaning of relapse and shared personal stories of relapse, how it affected them and others, and how they perceived themselves during this time. Group members were encouraged to identify triggers, warning signs and coping skills used when facing the possibility of relapse. Social supports were discussed and explored in detail. Apolinar JunesBrandon attended group and stayed the entire time. He sat quietly and listened to other group members. He asked if Prozac could cause depression. CSW directed him to his doctor. He states he is starting to have more energy but still appears to be a little drowsy and slowed down.   [place] Shaul L Kell Ferris MSW, LCSWA  12/22/2014, 3:17 PM

## 2014-12-22 NOTE — Progress Notes (Signed)
D) Patient pleasant and cooperative upon my assessment. Patient  completed Self Inventory Assessment and rates depression as  5/10, patient rates hopeless feelings as 5 /10.  Patient denies SI/HI, denies A/V hallucinations.  Patient's affect is flat and mood is sad.  Reports that his appetite is good.   A) Patient offered support and encouragement, patient encouraged to discuss feelings/concerns with staff. Patient verbalized understanding. Patient monitored Q15 minutes for safety. Patient met with MD  to discuss today's goals and plan of care.  R) Patient visible in milieu, he has  attended groups today.  Patient come to dining room for meals and snacks. Patient appropriate with staff and peers.   Patient taking medications as ordered. Will continue to monitor.

## 2014-12-23 NOTE — Progress Notes (Signed)
Patient had medications and snack then went to bed. Currently sleeping soundly. No sign of distress. Safety and security maintained.
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
## 2014-12-23 NOTE — Progress Notes (Signed)
Patient currently in the day room with staff and peers. Currently alert and oriented, pleasant and cooperative. Denies Suicidal thoughts and hallucinations. Interacting appropriately with staff and peers. No sign of discomfort noted. Safety precautions maintained.

## 2014-12-23 NOTE — [facility] Group Notes (Signed)
[facility] Group Notes:  (Nursing/MHT/Case Management/Adjunct)  Date:  12/23/2014  Time:  10:29 AM  Type of Therapy:  goal setting   Participation Level:  Did Not Attend    [place] Oldmanda Lea Campbell 12/23/2014, 10:29 AM

## 2014-12-23 NOTE — Progress Notes (Signed)
Patient is alert and oriented x4.  No signs of pain or distress noted. Patient is calm and cooperative with nursing and peers.  Patient denies any SI/HI/AVH at this time. Patients mood is flat, however he brighten on approach.   Patient is able to contract for safety. Patient rates his anxiety and depression 0/10 at this time.  Q 15min checks maintained for safety.

## 2014-12-23 NOTE — Plan of Care (Signed)
Problem: Ineffective individual coping Goal: LTG: Patient will report a decrease in negative feelings Outcome: Progressing Pt improved in mood, more interactive with staff and peers

## 2014-12-23 NOTE — Plan of Care (Signed)
Problem: Ineffective individual coping Goal: LTG: Patient will report a decrease in negative feelings Outcome: Progressing Patient notes he is remaining positive and trying to plan what he would like to do in his life.  Goal: STG: Pt will be able to identify effective and ineffective STG: Pt will be able to identify effective and ineffective coping patterns  Outcome: Progressing Patient is praying and reading scriptures which he states help him feel better. He is also comforted to plan positive things for his life. He is interactive and polite in the milieu. Goal: STG: Patient will remain free from self harm Outcome: Progressing No self-harm.  Goal: STG:Pt. will utilize relaxation techniques to reduce stress STG: Patient will utilize relaxation techniques to reduce stress levels  Outcome: Progressing Patient diligently working to be positive and control his stress levels.

## 2014-12-23 NOTE — [facility] Group Notes (Signed)
[facility] LCSW Group Therapy  12/23/2014 4:59 PM  Type of Therapy:  Group Therapy  Participation Level:  Minimal  Participation Quality:  Attentive  Affect:  Flat  Cognitive:  Alert  Insight:  Limited  Engagement in Therapy:  Limited  Modes of Intervention:  Discussion, Education, Socialization and Support  Summary of Progress/Problems: Pt will identify unhealthy thoughts and how they impact their emotions and behavior. Pt will be encouraged to discuss these thoughts, emotions and behaviors with the group. Anthony Luna read a positive affirmation and was able to relate to it. However, he was unable to explain why. After group he asked if his medications are causing his brain to work slowly. He was told to discuss medications with his doctor.   Sempra EnergyCandace L Kirstie Larsen MSW, LCSWA  12/23/2014, 4:59 PM

## 2014-12-24 NOTE — [facility] Group Notes (Signed)
[facility] LCSW Group Therapy  12/24/2014 9:15 PM  Type of Therapy:  Group Therapy  Participation Level:  Did Not Attend  Modes of Intervention:  Discussion, Education, Problem-solving, Socialization and Support  Summary of Progress/Problems:Overcoming obstacles: Pt was challenged to identify obstacles faced currently and processed barriers involved in overcoming these obstacles. Group members were asked to process the steps necessary for overcoming obstacles and explored motivation (external and internal) for facing these difficulties head on. Pt was asked to identify at least one area of concern and choose a skill of focus pulled from their "tool box."   Sempra EnergyCandace L Zalma Luna MSW, LCSWA  12/24/2014, 9:15 PM

## 2014-12-24 NOTE — Discharge Summary (Signed)
Physician Discharge Summary Note  Patient:  Anthony Luna is an 29 y.o., male MRN:  [id number] DOB:  1985-12-19 Patient phone:  [phone] (home)  Patient address:   [place] [address],  Total Time spent with patient: 30 minutes  Date of Admission:  12/16/2014 Date of Discharge: 12/11/2014  Reason for Admission:  Mania  Principal Problem: Schizoaffective disorder, bipolar type Discharge Diagnoses: Patient Active Problem List   Diagnosis Date Noted  . Cannabis use disorder, severe, dependence [F12.20] 11/30/2014  . Schizoaffective disorder, bipolar type [F25.0] 11/30/2014  . Tobacco use disorder [Z72.0] 11/30/2014    Musculoskeletal: Strength & Muscle Tone: within normal limits Gait & Station: normal Patient leans: N/A  Psychiatric Specialty Exam: Physical Exam   Review of Systems  Constitutional: Negative.   HENT: Negative.   Eyes: Negative.   Respiratory: Negative.   Cardiovascular: Negative.   Gastrointestinal: Negative.   Genitourinary: Negative.   Musculoskeletal: Negative.   Skin: Negative.   Neurological: Negative.   Endo/Heme/Allergies: Negative.   Psychiatric/Behavioral: Negative.     Blood pressure 116/79, pulse 61, temperature 98 F (36.7 C), temperature source Oral, resp. rate 20, height $RemoveBe'6\' 1"'VUtlpnxDW$  (9.357 m), weight 83.462 kg (184 lb), SpO2 100 %.Body mass index is 24.28 kg/(m^2).  General Appearance: Well Groomed  Engineer, water::  Good  Speech:  Clear and Coherent  Volume:  Normal  Mood:  Euthymic  Affect:  Congruent  Thought Process:  Logical  Orientation:  Full (Time, Place, and Person)  Thought Content:  Hallucinations: None  Suicidal Thoughts:  No  Homicidal Thoughts:  No  Memory:  Immediate;   Good Recent;   Good Remote;   Good  Judgement:  Fair  Insight:  Fair  Psychomotor Activity:  Normal  Concentration:  Good  Recall:  NA  Fund of Knowledge:Fair  Language: Good  Akathisia:  No  Handed:    AIMS (if indicated):      Assets:  Communication Skills Desire for Improvement Financial Resources/Insurance Housing Physical Health Social Support  ADL's:  Intact  Cognition: WNL  Sleep:  Number of Hours: 8.25   Have you used any form of tobacco in the last 30 days? (Cigarettes, Smokeless Tobacco, Cigars, and/or Pipes): Yes  Has this patient used any form of tobacco in the last 30 days? (Cigarettes, Smokeless Tobacco, Cigars, and/or Pipes) Yes, A prescription for an FDA-approved tobacco cessation medication was offered at discharge and the patient refused  History of Present Illness::   Identifying data. Anthony Luna is a 29 year old male with history of schizophrenia.  Chief complaint. "I got upset."  History of present illness. Anthony Luna has a long history of mental illness with several hospitalization and multiple medication trials. He was discharged from [facility] couple of weeks ago and returned to his group home. He reports that in spite of good treatment compliance he has continuous auditory hallucinations. He was brought back to the emergency room after conflict with his peer at the group home. The patient reports that he was patch on the buttock. It is unclear at this point whether there was aggression from the patient or the peer. It is unclear whether or not the patient can return to the group home the patient denies symptoms of depression or excessive anxiety. He denies using alcohol or illicit substances. He does not really take any responsibilities for the events leading to admission.  Past psychiatric history. Several prior hospitalizations including one at [facility]. He was also  in prison and in mental health than. He denies ever attempting suicide.  Family psychiatric history. Nonreported.  Medical history. None.  social history. He is disabled from mental illness. He lives in a group home. He is a recipient of Medicaid. His family lives in [place] and reportedly they stay in touch.    Hospital Course:   29 year old African-American male with history of schizoaffective disorder bipolar type. Patient was admitted after voicing suicidal ideation and auditory visual hallucinations.  Group home patient has been gone from the group home for a few days and has not received any medications during that time. During the time the patient was absent from the group home he has been using cannabis. At examination patient has euphoric mood, psychomotor agitation was sexually inappropriate and disinhibited.   For schizoaffective disorder: -Continue Prolixin 10 mg po bid. Patient received Prolixin Decanoate 25 mg IM on 6-21 and 50 mg was given yesterday July 5. Next Injection will be a scheduled on July 19. At that point I recommend to discontinue the morning dose of Prolixin as he has been complaining of sedation  -Continue lithium 450 mgpo bid. ----level 0.7. Prior to discharge will recheck a lithium level.  EPS/akathisia: Patient has been seen pacing the unit up and down. Continue amantadine 100 mg po bid and Klonopin 0.5 mg daily at bedtime  Tobacco use disorder: Continue Nicotrol Inhaler when necessary  Metabolic syndrome: Lipid panel is within the normal limits. Hemoglobin A1c S4.9. TSH is 1.3. Prolactin is 1.3.  Labs: lithium level 0.7. Lithium level on 7/8: again 0.7   Discharge dispo: Per conversation between social worker in group home looks like patient is okay to return to the group home. However the group home and is concerned about the patient's level of agitation and suspiciousness and paranoia.   SW spoke with [facility] on 7/7. They will not take pt back until staff can come and evaluate him. They will come today. They claim that a 30 notice was given and it is now up to them to decide if he could return.  On the day of the discharge the patient reported significant improvement. He denied SI, HI or auditory or visual  hallucinations. He was calm pleasant and cooperative. There was no evidence of agitation or anxiety. Patient was not suspicious and there was no evidence of interaction to internal stimuli. There were no major side effects from medications noted at discharge. The patient denied having any physical complaints. During his stay in the unit patient participated in groups. There were no disruptive behaviors. There was no need for seclusion, restraints or forced medications.  Results for JACOBS, GOLAB (MRN [id number]) as of 12/11/2014 11:27  Ref. Range 11/28/2014 09:10 11/29/2014 19:49 11/30/2014 15:10 12/04/2014 06:14 12/10/2014 06:03  Sodium Latest Ref Range: 135-145 mmol/L 140      Potassium Latest Ref Range: 3.5-5.1 mmol/L 3.9      Chloride Latest Ref Range: 101-111 mmol/L 106      CO2 Latest Ref Range: 22-32 mmol/L 27      BUN Latest Ref Range: 6-20 mg/dL 22 (H)      Creatinine Latest Ref Range: 0.61-1.24 mg/dL 1.36 (H)      Calcium Latest Ref Range: 8.9-10.3 mg/dL 9.6      EGFR (Non-African Amer.) Latest Ref Range: >60 mL/min >60      EGFR (African American) Latest Ref Range: >60 mL/min >60      Glucose Latest Ref Range: 65-99 mg/dL 78  Anion gap Latest Ref Range: 5-15  7      Alkaline Phosphatase Latest Ref Range: 38-126 U/L 58      Albumin Latest Ref Range: 3.5-5.0 g/dL 4.4      AST Latest Ref Range: 15-41 U/L 29      ALT Latest Ref Range: 17-63 U/L 19      Total Protein Latest Ref Range: 6.5-8.1 g/dL 7.5      Total Bilirubin Latest Ref Range: 0.3-1.2 mg/dL 0.9      Cholesterol Latest Ref Range: 0-200 mg/dL  198     Triglycerides Latest Ref Range: <150 mg/dL  57     HDL Cholesterol Latest Ref Range: >40 mg/dL  53     LDL (calc) Latest Ref Range: 0-99 mg/dL  134 (H)     VLDL Latest Ref Range: 0-40 mg/dL  11     Total CHOL/HDL Ratio Latest Units: RATIO  3.7     WBC Latest Ref Range: 3.8-10.6 K/uL 5.6      RBC Latest Ref Range: 4.40-5.90 MIL/uL 5.02      Hemoglobin Latest Ref Range:  13.0-18.0 g/dL 15.9      HCT Latest Ref Range: 40.0-52.0 % 46.6      MCV Latest Ref Range: 80.0-100.0 fL 92.8      MCH Latest Ref Range: 26.0-34.0 pg 31.6      MCHC Latest Ref Range: 32.0-36.0 g/dL 34.0      RDW Latest Ref Range: 11.5-14.5 % 13.8      Platelets Latest Ref Range: 150-440 K/uL 148 (L)      Lithium Lvl Latest Ref Range: 0.60-1.20 mmol/L    7.34 1.93  Salicylate Lvl Latest Ref Range: 2.8-30.0 mg/dL <4.0      Acetaminophen (Tylenol), S Latest Ref Range: 10-30 ug/mL <10 (L)      Prolactin Latest Ref Range: 4.0-15.2 ng/mL   1.3 (L)    Hemoglobin A1C Latest Ref Range: 4.0-6.0 %  4.9     TSH Latest Ref Range: 0.350-4.500 uIU/mL   1.339    Alcohol, Ethyl (B) Latest Ref Range: <5 mg/dL <5      Amphetamines, Ur Screen Latest Ref Range: NONE DETECTED  NONE DETECTED      Barbiturates, Ur Screen Latest Ref Range: NONE DETECTED  NONE DETECTED      Benzodiazepine, Ur Scrn Latest Ref Range: NONE DETECTED  NONE DETECTED      Cocaine Metabolite,Ur [place] Latest Ref Range: NONE DETECTED  NONE DETECTED      Methadone Scn, Ur Latest Ref Range: NONE DETECTED  NONE DETECTED      MDMA (Ecstasy)Ur Screen Latest Ref Range: NONE DETECTED  NONE DETECTED      Cannabinoid 50 Ng, Ur [place] Latest Ref Range: NONE DETECTED  POSITIVE (A)      Opiate, Ur Screen Latest Ref Range: NONE DETECTED  NONE DETECTED      Phencyclidine (PCP) Ur S Latest Ref Range: NONE DETECTED  NONE DETECTED      Tricyclic, Ur Screen Latest Ref Range: NONE DETECTED  POSITIVE (A)        Results for CHANZE, TEAGLE (MRN [id number]) as of 12/24/2014 11:12  Ref. Range 12/14/2014 20:07 12/17/2014 15:11 12/19/2014 08:45 12/22/2014 07:05  Sodium Latest Ref Range: 135-145 mmol/L 140     Potassium Latest Ref Range: 3.5-5.1 mmol/L 3.7     Chloride Latest Ref Range: 101-111 mmol/L 106     CO2 Latest Ref Range: 22-32 mmol/L 26     BUN Latest  Ref Range: 6-20 mg/dL 17     Creatinine Latest Ref Range: 0.61-1.24 mg/dL 4.18 (H)     Calcium Latest Ref  Range: 8.9-10.3 mg/dL 9.7     EGFR (Non-African Amer.) Latest Ref Range: >60 mL/min 54 (L)     EGFR (African American) Latest Ref Range: >60 mL/min >60     Glucose Latest Ref Range: 65-99 mg/dL 258 (H)     Anion gap Latest Ref Range: 5-15  8     Alkaline Phosphatase Latest Ref Range: 38-126 U/L 60     Albumin Latest Ref Range: 3.5-5.0 g/dL 4.4     AST Latest Ref Range: 15-41 U/L 20     ALT Latest Ref Range: 17-63 U/L 16 (L)     Total Protein Latest Ref Range: 6.5-8.1 g/dL 7.4     Total Bilirubin Latest Ref Range: 0.3-1.2 mg/dL 0.8     Cholesterol Latest Ref Range: 0-200 mg/dL   271   Triglycerides Latest Ref Range: <150 mg/dL   471   HDL Cholesterol Latest Ref Range: >40 mg/dL   44   LDL (calc) Latest Ref Range: 0-99 mg/dL   78   VLDL Latest Ref Range: 0-40 mg/dL   25   Total CHOL/HDL Ratio Latest Units: RATIO   3.3   WBC Latest Ref Range: 3.8-10.6 K/uL 11.4 (H)     RBC Latest Ref Range: 4.40-5.90 MIL/uL 4.90     Hemoglobin Latest Ref Range: 13.0-18.0 g/dL 57.8     HCT Latest Ref Range: 40.0-52.0 % 45.0     MCV Latest Ref Range: 80.0-100.0 fL 92.0     MCH Latest Ref Range: 26.0-34.0 pg 31.5     MCHC Latest Ref Range: 32.0-36.0 g/dL 73.9     RDW Latest Ref Range: 11.5-14.5 % 13.7     Platelets Latest Ref Range: 150-440 K/uL 234     Neutrophils Latest Units: % 68     Lymphocytes Latest Units: % 17     Monocytes Relative Latest Units: % 12     Eosinophil Latest Units: % 2     Basophil Latest Units: % 1     NEUT# Latest Ref Range: 1.4-6.5 K/uL 7.8 (H)     Lymphocyte # Latest Ref Range: 1.0-3.6 K/uL 1.9     Monocyte # Latest Ref Range: 0.2-1.0 K/uL 1.3 (H)     Eosinophils Absolute Latest Ref Range: 0-0.7 K/uL 0.2     Basophils Absolute Latest Ref Range: 0-0.1 K/uL 0.1     Lithium Lvl Latest Ref Range: 0.60-1.20 mmol/L  0.72  0.72  Prolactin Latest Ref Range: 4.0-15.2 ng/mL   6.1   Hemoglobin A1C Latest Ref Range: 4.0-6.0 %   5.1   TSH Latest Ref Range: 0.350-4.500 uIU/mL   1.312    Appearance Latest Ref Range: CLEAR  CLEAR (A)     Bacteria, UA Latest Ref Range: NONE SEEN  RARE (A)     Bilirubin Urine Latest Ref Range: NEGATIVE  NEGATIVE     Color, Urine Latest Ref Range: YELLOW  AMBER (A)     Glucose Latest Ref Range: NEGATIVE mg/dL NEGATIVE     Hgb urine dipstick Latest Ref Range: NEGATIVE  NEGATIVE     Ketones, ur Latest Ref Range: NEGATIVE mg/dL TRACE (A)     Leukocytes, UA Latest Ref Range: NEGATIVE  NEGATIVE     Mucous Unknown PRESENT     Nitrite Latest Ref Range: NEGATIVE  NEGATIVE     pH Latest Ref Range: 5.0-8.0  6.0     Protein Latest Ref Range: NEGATIVE mg/dL 100 (A)     RBC / HPF Latest Ref Range: 0-5 RBC/hpf 0-5     Specific Gravity, Urine Latest Ref Range: 1.005-1.030  1.034 (H)     Squamous Epithelial / LPF Latest Ref Range: NONE SEEN  NONE SEEN     WBC, UA Latest Ref Range: 0-5 WBC/hpf 0-5     Alcohol, Ethyl (B) Latest Ref Range: <5 mg/dL <5     Amphetamines, Ur Screen Latest Ref Range: NONE DETECTED  NONE DETECTED     Barbiturates, Ur Screen Latest Ref Range: NONE DETECTED  NONE DETECTED     Benzodiazepine, Ur Scrn Latest Ref Range: NONE DETECTED  NONE DETECTED     Cocaine Metabolite,Ur [place] Latest Ref Range: NONE DETECTED  NONE DETECTED     Methadone Scn, Ur Latest Ref Range: NONE DETECTED  NONE DETECTED     MDMA (Ecstasy)Ur Screen Latest Ref Range: NONE DETECTED  NONE DETECTED     Cannabinoid 50 Ng, Ur [place] Latest Ref Range: NONE DETECTED  NONE DETECTED     Opiate, Ur Screen Latest Ref Range: NONE DETECTED  NONE DETECTED     Phencyclidine (PCP) Ur S Latest Ref Range: NONE DETECTED  NONE DETECTED     Tricyclic, Ur Screen Latest Ref Range: NONE DETECTED  POSITIVE (A)       Discharge Vitals:   Blood pressure 116/79, pulse 61, temperature 98 F (36.7 C), temperature source Oral, resp. rate 20, height $RemoveBe'6\' 1"'dpzYlXmkL$  (1.854 m), weight 83.462 kg (184 lb), SpO2 100 %. Body mass index is 24.28 kg/(m^2).   Discharge destination:  Other:  Group home  Is patient  on multiple antipsychotic therapies at discharge:  No   Has Patient had three or more failed trials of antipsychotic monotherapy by history:  No    Recommended Plan for Multiple Antipsychotic Therapies: NA     Medication List    TAKE these medications      Indication   amantadine 100 MG capsule  Commonly known as:  SYMMETREL  Take 1 capsule (100 mg total) by mouth 2 (two) times daily.  Notes to Patient:  Prevent side effects from prolixin      clonazePAM 0.5 MG tablet  Commonly known as:  KLONOPIN  Take 1 tablet (0.5 mg total) by mouth at bedtime.  Notes to Patient:  Insomnia and akathisia      fluPHENAZine 10 MG tablet  Commonly known as:  PROLIXIN  Take 1 tablet (10 mg total) by mouth 2 (two) times daily before a meal.  Notes to Patient:  psychosis      fluPHENAZine decanoate 25 MG/ML injection  Commonly known as:  PROLIXIN  Inject 2 mLs (50 mg total) into the muscle every 14 (fourteen) days.  Start taking on:  01/02/2015  Notes to Patient:  psychosis      lithium carbonate 450 MG CR tablet  Commonly known as:  ESKALITH  Take 1 tablet (450 mg total) by mouth every 12 (twelve) hours.  Notes to Patient:  mood          Follow-up recommendations:  Other:  Follow-up with outpatient psychiatrist  Comments:  Due for prolixin  IM 50 mg on 01/02/2015  Total Discharge Time: 30 minutes  Signed: Hildred Priest 12/25/2014, 7:46 AM

## 2014-12-24 NOTE — Progress Notes (Signed)
Patient is alert and oriented. His mood is euthymic with restricted affect. He denies SI or HI. Patient states that he is currently not having any hallucinations or paranoid thoughts. His speech is clear and coherent, thoughts are organized and linear.  Patient is anxious regarding upcoming discharge. He was able to discuss concerns with staff and was receptive to teaching regarding coping skills. He will need reinforcement.  Patient paces the unit but is not in any acute distress. He has been compliant  With all nursing interventions.  Will continue with current treatment plan, monitor clinical status, and maintain on 15 minute safety checks.
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
## 2014-12-24 NOTE — [facility] Suicide Risk Assessment (Signed)
[facility]   Demographic Factors:  Male  Total Time spent with patient: 30 minutes    Psychiatric Specialty Exam: Physical Exam   ROS                                                          Have you used any form of tobacco in the last 30 days? (Cigarettes, Smokeless Tobacco, Cigars, and/or Pipes): Yes  Has this patient used any form of tobacco in the last 30 days? (Cigarettes, Smokeless Tobacco, Cigars, and/or Pipes) Yes, A prescription for an FDA-approved tobacco cessation medication was offered at discharge and the patient refused  Mental Status Per Nursing Assessment::   On Admission:     Current Mental Status by Physician: Pleasant, calm, cooperative. No evidence of interaction to internal stimuli, sleeping well, no agitation.  Patient's mood is euthymic, affect reactive and appropriate congruent. Compliant with medications, no evidence of side effects. Hopeful and future oriented  Loss Factors: Loss of significant relationship  Historical Factors: Impulsivity  Risk Reduction Factors:   Living with another person, especially a relative and Positive social support  Continued Clinical Symptoms:  Alcohol/Substance Abuse/Dependencies More than one psychiatric diagnosis Previous Psychiatric Diagnoses and Treatments  Cognitive Features That Contribute To Risk:  Closed-mindedness    Suicide Risk:  Minimal: No identifiable suicidal ideation.  Patients presenting with no risk factors but with morbid ruminations; may be classified as minimal risk based on the severity of the depressive symptoms  Principal Problem: Schizoaffective disorder, bipolar type Discharge Diagnoses:  Patient Active Problem List   Diagnosis Date Noted  . Cannabis use disorder, severe, dependence [F12.20] 11/30/2014  . Schizoaffective disorder, bipolar type [F25.0] 11/30/2014  . Tobacco use disorder [Z72.0] 11/30/2014      Plan Of  Care/Follow-up recommendations:  Other:  Follow-up with outpatient psychiatrist  Is patient on multiple antipsychotic therapies at discharge:  No   Has Patient had three or more failed trials of antipsychotic monotherapy by history:  No  Recommended Plan for Multiple Antipsychotic Therapies: NA    Jimmy FootmanHernandez-Gonzalez,  Anthony Amberg 12/25/2014, 7:48 AM

## 2014-12-25 NOTE — Plan of Care (Signed)
Problem: Ineffective individual coping Goal: LTG: Patient will report a decrease in negative feelings Outcome: Progressing Patient continues to work on being positive and notes feeling better than he came in, despite occasional set backs in his feelings.  Goal: STG: Patient will remain free from self harm Outcome: Progressing No self-harm.

## 2014-12-25 NOTE — Progress Notes (Signed)
  [facility] :  Will you be returning to the same living situation after discharge:  Yes,   [facility] At discharge, do you have transportation home?: Yes,   Group Home Staff Do you have the ability to pay for your medications: Yes,  Medicaid  Release of information consent forms completed and in the chart;  Patient's signature needed at discharge.  Patient to Follow up at: Follow-up Information    Go to [facility].   Why:  Walk-ins Monday, Wednesday, Friday between 8am-3pm, , Hospital Follow up, Outpatient Medication Management, Therapy, Please take your photo ID and Insurance card   Contact information:   [address] [place] [address] [phone] Fax [phone]       [facility]   Why:  PSR Program   Contact information:   [address] [place], [place] [address] [phone]      Patient denies SI/HI: Yes,       Safety Planning and Suicide Prevention discussed: Yes,     Have you used any form of tobacco in the last 30 days? (Cigarettes, Smokeless Tobacco, Cigars, and/or Pipes): Yes  Has patient been referred to the Quitline?: Patient refused referral  Kenaz Olafson, Cleda DaubSara P, MSW, LCSW 12/25/2014, 2:52 PM

## 2014-12-25 NOTE — Progress Notes (Signed)
Patient pleasant and cooperative. Affect less bright than yesterday. Patient notes he is continuing to work on Pharmacologistcoping skills and manage his concerns in a healthy way. He is active in the milieu and polite with his peers. He notes his mood has improved but he is still affected by his AH. He denies SI and HI and notes feeling safe on the unit.

## 2014-12-25 NOTE — Progress Notes (Signed)
AVS H&P Discharge Summary faxed to [facility] for hospital follow-up °

## 2014-12-25 NOTE — Progress Notes (Signed)
Pt discharged home. DC instructions provided and explained. Medications reviewed. Rx given. All questions answered. Pt stable at discharged. Denies SI, HI, AVH.  

## 2014-12-25 NOTE — [facility] Suicide Risk Assessment (Signed)
[facility] INPATIENT:  Family/Significant Other Suicide Prevention Education  Suicide Prevention Education:  Education Completed; Group Home Staff, Recruitment consultantrancis [place],  (name of family member/significant other) has been identified by the patient as the family member/significant other with whom the patient will be residing, and identified as the person(s) who will aid the patient in the event of a mental health crisis (suicidal ideations/suicide attempt).  With written consent from the patient, the family member/significant other has been provided the following suicide prevention education, prior to the and/or following the discharge of the patient.  The suicide prevention education provided includes the following:  Suicide risk factors  Suicide prevention and interventions  National Suicide Hotline telephone number  [facility] assessment telephone number  [facility] Emergency Assistance 911  [facility] and/or Residential Mobile Crisis [facility] telephone number  Request made of family/significant other to:  Remove weapons (e.g., guns, rifles, knives), all items previously/currently identified as safety concern.    Remove drugs/medications (over-the-counter, prescriptions, illicit drugs), all items previously/currently identified as a safety concern.  The family member/significant other verbalizes understanding of the suicide prevention education information provided.  The family member/significant other agrees to remove the items of safety concern listed above.  Glennon MacLaws, Breleigh Carpino P, MSW, LCSW 12/25/2014, 2:51 PM

## 2015-06-04 ENCOUNTER — Emergency Department
Admission: EM | Admit: 2015-06-04 | Discharge: 2015-06-04 | Disposition: A | Discharge status: Home / Self Care | Payer: Medicaid Other | Attending: Emergency Medicine | Admitting: Emergency Medicine

## 2015-06-04 ENCOUNTER — Encounter: Payer: Self-pay | Admitting: Emergency Medicine

## 2015-06-04 DIAGNOSIS — F17210 Nicotine dependence, cigarettes, uncomplicated: Secondary | ICD-10-CM | POA: Insufficient documentation

## 2015-06-04 DIAGNOSIS — L299 Pruritus, unspecified: Secondary | ICD-10-CM | POA: Insufficient documentation

## 2015-06-04 DIAGNOSIS — Z79899 Other long term (current) drug therapy: Secondary | ICD-10-CM | POA: Diagnosis not present

## 2015-06-04 DIAGNOSIS — F99 Mental disorder, not otherwise specified: Secondary | ICD-10-CM | POA: Diagnosis present

## 2015-06-04 DIAGNOSIS — L988 Other specified disorders of the skin and subcutaneous tissue: Secondary | ICD-10-CM | POA: Insufficient documentation

## 2015-06-04 DIAGNOSIS — R440 Auditory hallucinations: Secondary | ICD-10-CM | POA: Diagnosis not present

## 2015-06-04 DIAGNOSIS — F1310 Sedative, hypnotic or anxiolytic abuse, uncomplicated: Secondary | ICD-10-CM | POA: Insufficient documentation

## 2015-06-04 LAB — URINE DRUG SCREEN, QUALITATIVE (ARMC ONLY)
AMPHETAMINES, UR SCREEN: NOT DETECTED
BARBITURATES, UR SCREEN: NOT DETECTED
Benzodiazepine, Ur Scrn: POSITIVE — AB
COCAINE METABOLITE, UR ~~LOC~~: NOT DETECTED
Cannabinoid 50 Ng, Ur ~~LOC~~: NOT DETECTED
MDMA (ECSTASY) UR SCREEN: NOT DETECTED
METHADONE SCREEN, URINE: NOT DETECTED
OPIATE, UR SCREEN: NOT DETECTED
Phencyclidine (PCP) Ur S: NOT DETECTED
TRICYCLIC, UR SCREEN: NOT DETECTED

## 2015-06-04 LAB — COMPREHENSIVE METABOLIC PANEL
ALBUMIN: 4.4 g/dL (ref 3.5–5.0)
ALK PHOS: 57 U/L (ref 38–126)
ALT: 22 U/L (ref 17–63)
ANION GAP: 6 (ref 5–15)
AST: 21 U/L (ref 15–41)
BUN: 17 mg/dL (ref 6–20)
CALCIUM: 9.3 mg/dL (ref 8.9–10.3)
CHLORIDE: 104 mmol/L (ref 101–111)
CO2: 27 mmol/L (ref 22–32)
Creatinine, Ser: 1.42 mg/dL — ABNORMAL HIGH (ref 0.61–1.24)
GFR calc non Af Amer: >60 mL/min (ref >60)
GLUCOSE: 102 mg/dL — AB (ref 65–99)
Potassium: 4.1 mmol/L (ref 3.5–5.1)
SODIUM: 137 mmol/L (ref 135–145)
Total Bilirubin: 0.4 mg/dL (ref 0.3–1.2)
Total Protein: 7.1 g/dL (ref 6.5–8.1)

## 2015-06-04 LAB — SALICYLATE LEVEL

## 2015-06-04 LAB — CBC
HEMATOCRIT: 47.3 % (ref 40.0–52.0)
HEMOGLOBIN: 15.8 g/dL (ref 13.0–18.0)
MCH: 31 pg (ref 26.0–34.0)
MCHC: 33.4 g/dL (ref 32.0–36.0)
MCV: 93 fL (ref 80.0–100.0)
Platelets: 144 10*3/uL — ABNORMAL LOW (ref 150–440)
RBC: 5.08 MIL/uL (ref 4.40–5.90)
RDW: 14.2 % (ref 11.5–14.5)
WBC: 6 10*3/uL (ref 3.8–10.6)

## 2015-06-04 LAB — ETHANOL: Alcohol, Ethyl (B): <5 mg/dL (ref <5)

## 2015-06-04 LAB — ACETAMINOPHEN LEVEL

## 2015-06-04 MED ORDER — HYDROXYZINE HCL 25 MG PO TABS: 25.0000 mg | ORAL_TABLET | Freq: Three times a day (TID) | ORAL | Status: DC | PRN

## 2015-06-04 NOTE — ED Notes (Signed)
BEHAVIORAL HEALTH ROUNDING  Patient sleeping: No.  Patient alert and oriented: yes  Behavior appropriate: Yes. ; If no, describe:  Nutrition and fluids offered: Yes  Toileting and hygiene offered: Yes  Sitter present: not applicable, Q 15 min safety rounds and observation via security camera. Law enforcement present: Yes ODS  

## 2015-06-04 NOTE — ED Notes (Signed)
ENVIRONMENTAL ASSESSMENT  Potentially harmful objects out of patient reach: Yes.  Personal belongings secured: Yes.  Patient dressed in hospital provided attire only: Yes.  Plastic bags out of patient reach: Yes.  Patient care equipment (cords, cables, call bells, lines, and drains) shortened, removed, or accounted for: Yes.  Equipment and supplies removed from bottom of stretcher: Yes.  Potentially toxic materials out of patient reach: Yes.  Sharps container removed or out of patient reach: Yes.   BEHAVIORAL HEALTH ROUNDING  Patient sleeping: No.  Patient alert and oriented: yes  Behavior appropriate: Yes. ; If no, describe:  Nutrition and fluids offered: Yes  Toileting and hygiene offered: Yes  Sitter present: not applicable, Q 15 min safety rounds and observation via security camera. Law enforcement present: Yes ODS  

## 2015-06-04 NOTE — ED Provider Notes (Signed)
[facility] Emergency Department Provider Note  ____________________________________________  Time seen: Approximately 309 AM  I have reviewed the triage vital signs and the nursing notes.   HISTORY  Chief Complaint Mental Health Problem    HPI Anthony Luna is a 29 y.o. male comes into the hospital today after being brought in by police. The patient reports that he was hearing voices. He reports that he woke up with itching of his legs and heard voices telling him to leave his group home. He reports that he is unsure exactly what time it was that he left. The patient denies any suicidal or homicidal ideation. The patient reports that he just walked away from his group home. The patient denies drinking but has used marijuana recently. The patient reports that he does have a scratch to his leg on the left that is bleeding from the itching. The patient is unsure why the police brought him in but reports that he has not heard voices in a long time. He has no visual hallucinations or tactile hallucinations.   Past Medical History  Diagnosis Date  . None to low serum cortisol response with adrenocorticotrophic hormone (ACTH) stimulation test     Patient Active Problem List   Diagnosis Date Noted  . Cannabis use disorder, severe, dependence (HCC) 11/30/2014  . Schizoaffective disorder, bipolar type (HCC) 11/30/2014  . Tobacco use disorder 11/30/2014    History reviewed. No pertinent past surgical history.  Current Outpatient Rx  Name  Route  Sig  Dispense  Refill  . amantadine (SYMMETREL) 100 MG capsule   Oral   Take 1 capsule (100 mg total) by mouth 2 (two) times daily.   60 capsule   0   . clonazePAM (KLONOPIN) 0.5 MG tablet   Oral   Take 1 tablet (0.5 mg total) by mouth at bedtime.   30 tablet   0   . fluPHENAZine (PROLIXIN) 10 MG tablet   Oral   Take 1 tablet (10 mg total) by mouth 2 (two) times daily before a meal.   60 tablet   0    . fluPHENAZine decanoate (PROLIXIN) 25 MG/ML injection   Intramuscular   Inject 2 mLs (50 mg total) into the muscle every 14 (fourteen) days.   5 mL   0     Due on July 19   . hydrOXYzine (ATARAX/VISTARIL) 25 MG tablet   Oral   Take 1 tablet (25 mg total) by mouth 3 (three) times daily as needed for itching.   15 tablet   0   . lithium carbonate (ESKALITH) 450 MG CR tablet   Oral   Take 1 tablet (450 mg total) by mouth every 12 (twelve) hours.   60 tablet   0     Allergies Review of patient's allergies indicates no known allergies.  History reviewed. No pertinent family history.  Social History Social History  Substance Use Topics  . Smoking status: Current Every Day Smoker -- 0.50 packs/day    Types: Cigarettes    Start date: 11/29/2002  . Smokeless tobacco: None  . Alcohol Use: No    Review of Systems Constitutional: No fever/chills Eyes: No visual changes. ENT: No sore throat. Cardiovascular: Denies chest pain. Respiratory: Denies shortness of breath. Gastrointestinal: No abdominal pain.  No nausea, no vomiting.  No diarrhea.  No constipation. Genitourinary: Negative for dysuria. Musculoskeletal: Negative for back pain. Skin: Abrasion to left calf Neurological: Negative for headaches, focal weakness or numbness. Psychiatric:Auditory hallucinations  10-point ROS otherwise negative.  ____________________________________________   PHYSICAL EXAM:  VITAL SIGNS: ED Triage Vitals  Enc Vitals Group     BP 06/04/15 0203 117/74 mmHg     Pulse Rate 06/04/15 0203 79     Resp 06/04/15 0203 18     Temp 06/04/15 0201 98 F (36.7 C)     Temp Source 06/04/15 0203 Oral     SpO2 06/04/15 0203 99 %     Weight 06/04/15 0203 205 lb (92.987 kg)     Height 06/04/15 0203 6' (1.829 m)     Head Cir --      Peak Flow --      Pain Score --      Pain Loc --      Pain Edu? --      Excl. in GC? --     Constitutional: Alert and oriented. Well appearing and in no  acute distress. Eyes: Conjunctivae are normal. PERRL. EOMI. Head: Atraumatic. Nose: No congestion/rhinnorhea. Mouth/Throat: Mucous membranes are moist.  Oropharynx non-erythematous. Cardiovascular: Normal rate, regular rhythm. Grossly normal heart sounds.  Good peripheral circulation. Respiratory: Normal respiratory effort.  No retractions. Lungs CTAB. Gastrointestinal: Soft and nontender. No distention. Positive bowel sounds Musculoskeletal: No lower extremity tenderness nor edema.   Neurologic:  Normal speech and language.  Skin:  Skin is warm, dry and intact. Bleeding abrasion to left calf laterally Psychiatric: Flat affect  ____________________________________________   LABS (all labs ordered are listed, but only abnormal results are displayed)  Labs Reviewed  COMPREHENSIVE METABOLIC PANEL - Abnormal; Notable for the following:    Glucose, Bld 102 (*)    Creatinine, Ser 1.42 (*)    All other components within normal limits  ACETAMINOPHEN LEVEL - Abnormal; Notable for the following:    Acetaminophen (Tylenol), Serum <10 (*)    All other components within normal limits  CBC - Abnormal; Notable for the following:    Platelets 144 (*)    All other components within normal limits  URINE DRUG SCREEN, QUALITATIVE ([facility] ONLY) - Abnormal; Notable for the following:    Benzodiazepine, Ur Scrn POSITIVE (*)    All other components within normal limits  ETHANOL  SALICYLATE LEVEL   ____________________________________________  EKG  None ____________________________________________  RADIOLOGY  None ____________________________________________   PROCEDURES  Procedure(s) performed: None  Critical Care performed: No  ____________________________________________   INITIAL IMPRESSION / ASSESSMENT AND PLAN / ED COURSE  Pertinent labs & imaging results that were available during my care of the patient were reviewed by me and considered in my medical decision making (see chart  for details).  This is a 29 year old male who came in with police after being found having left his group home. Patient is not suicidal or homicidal. He has some auditory hallucinations but he is also schizophrenic. I do not feel the patient needs any acute intervention at this time and we contacted his group home. [facility] reports that they're unable to pick him up until 8 AM. The patient has no acute distress.   ____________________________________________   FINAL CLINICAL IMPRESSION(S) / ED DIAGNOSES  Final diagnoses:  Auditory hallucinations      Rebecka ApleyAllison P Brennin Durfee, MD 06/04/15 [phone]

## 2015-06-04 NOTE — ED Notes (Signed)
Pt observed standing in the doorway of his room - I waved and stated good morning and he waved back and smiled  NAD observed continue to monitor

## 2015-06-04 NOTE — ED Notes (Signed)
1/1 bags of belongings returned to him and he verbalizes that he has received back all items that he came here with   Discharge instructions reviewed with him and a caregiver from his group home and they both verbalized agreement and understanding  A script for atarax  was provided and I educated the caregiver about the medication

## 2015-06-04 NOTE — ED Notes (Signed)
Report received from Tiffany RN.

## 2015-06-04 NOTE — ED Notes (Signed)
ED BHU PLACEMENT JUSTIFICATION Is the patient under IVC or is there intent for IVC: No. Is the patient medically cleared: Yes.   Is there vacancy in the ED BHU: yes Is the population mix appropriate for patient: Yes.   Is the patient awaiting placement in inpatient or outpatient setting:  Return to group home Has the patient had a psychiatric consult: No. Survey of unit performed for contraband, proper placement and condition of furniture, tampering with fixtures in bathroom, shower, and each patient room: Yes.  ; Findings:  APPEARANCE/BEHAVIOR Calm and cooperative NEURO ASSESSMENT Orientation:  Oriented x3 Hallucinations: No.None noted (Hallucinations) Speech: Normal Gait: normal RESPIRATORY ASSESSMENT Even  unlabored respirations noted  CARDIOVASCULAR ASSESSMENT Regular rate  Pulses equal  Skin warm and dry   GASTROINTESTINAL ASSESSMENT no GI complaint EXTREMITIES Full ROM  PLAN OF CARE Provide calm/safe environment. Vital signs assessed twice daily. ED BHU Assessment once each 12-hour shift. Collaborate with intake RN daily or as condition indicates. Assure the ED provider has rounded once each shift. Provide and encourage hygiene. Provide redirection as needed. Assess for escalating behavior; address immediately and inform ED provider.  Assess family dynamic and appropriateness for visitation as needed: Yes.  ; If necessary, describe findings:  Educate the patient/family about BHU procedures/visitation: Yes.  ; If necessary, describe findings:

## 2015-06-04 NOTE — ED Notes (Signed)

## 2015-06-04 NOTE — ED Notes (Signed)
Pt brought to ED by [employer] officer after being reported as missing from his group home. Pt reports he woke itching and his roommate was snoring. Pt reports he then began to hear voices that were telling him to just leave the group home so he just walked away. Pt denies etoh use but does report he recently tried pot. Pt denies SI/HI and VH but does report AH.

## 2016-10-06 ENCOUNTER — Other Ambulatory Visit: Payer: Self-pay | Admitting: Neurology

## 2016-10-06 DIAGNOSIS — G2119 Other drug induced secondary parkinsonism: Secondary | ICD-10-CM

## 2016-10-16 ENCOUNTER — Ambulatory Visit: Payer: Medicaid Other

## 2016-10-17 ENCOUNTER — Ambulatory Visit: Payer: Medicaid Other

## 2016-10-29 ENCOUNTER — Ambulatory Visit
Admission: RE | Admit: 2016-10-29 | Discharge: 2016-10-29 | Disposition: A | Discharge status: Home / Self Care | Payer: Medicaid Other | Source: Ambulatory Visit | Attending: Neurology | Admitting: Neurology

## 2016-10-29 DIAGNOSIS — G2119 Other drug induced secondary parkinsonism: Secondary | ICD-10-CM | POA: Insufficient documentation

## 2016-10-29 DIAGNOSIS — F200 Paranoid schizophrenia: Secondary | ICD-10-CM | POA: Insufficient documentation

## 2016-10-29 MED ORDER — GADOBENATE DIMEGLUMINE 529 MG/ML IV SOLN
20.0000 mL | Freq: Once | INTRAVENOUS | Status: AC | PRN
  Administered 2016-10-29: 19 mL via INTRAVENOUS

## 2016-12-06 ENCOUNTER — Emergency Department
Admission: EM | Admit: 2016-12-06 | Discharge: 2016-12-06 | Disposition: A | Discharge status: Home / Self Care | Payer: Medicaid Other | Attending: Emergency Medicine | Admitting: Emergency Medicine

## 2016-12-06 ENCOUNTER — Encounter: Payer: Self-pay | Admitting: Emergency Medicine

## 2016-12-06 DIAGNOSIS — F1910 Other psychoactive substance abuse, uncomplicated: Secondary | ICD-10-CM | POA: Insufficient documentation

## 2016-12-06 DIAGNOSIS — R4689 Other symptoms and signs involving appearance and behavior: Secondary | ICD-10-CM | POA: Diagnosis present

## 2016-12-06 DIAGNOSIS — Z79899 Other long term (current) drug therapy: Secondary | ICD-10-CM | POA: Insufficient documentation

## 2016-12-06 DIAGNOSIS — F17210 Nicotine dependence, cigarettes, uncomplicated: Secondary | ICD-10-CM | POA: Diagnosis not present

## 2016-12-06 DIAGNOSIS — F250 Schizoaffective disorder, bipolar type: Secondary | ICD-10-CM | POA: Insufficient documentation

## 2016-12-06 HISTORY — DX: Paranoid schizophrenia: F20.0

## 2016-12-06 LAB — CBC WITH DIFFERENTIAL/PLATELET
BASOS ABS: 0 10*3/uL (ref 0–0.1)
Basophils Relative: 1 %
Eosinophils Absolute: 0.1 10*3/uL (ref 0–0.7)
Eosinophils Relative: 3 %
HEMATOCRIT: 44.9 % (ref 40.0–52.0)
Hemoglobin: 15.7 g/dL (ref 13.0–18.0)
LYMPHS PCT: 28 %
Lymphs Abs: 1.5 10*3/uL (ref 1.0–3.6)
MCH: 32.4 pg (ref 26.0–34.0)
MCHC: 34.9 g/dL (ref 32.0–36.0)
MCV: 92.7 fL (ref 80.0–100.0)
MONO ABS: 0.6 10*3/uL (ref 0.2–1.0)
Monocytes Relative: 11 %
Neutro Abs: 3 10*3/uL (ref 1.4–6.5)
Neutrophils Relative %: 57 %
Platelets: 167 10*3/uL (ref 150–440)
RBC: 4.84 MIL/uL (ref 4.40–5.90)
RDW: 13.1 % (ref 11.5–14.5)
WBC: 5.3 10*3/uL (ref 3.8–10.6)

## 2016-12-06 LAB — URINE DRUG SCREEN, QUALITATIVE (ARMC ONLY)
AMPHETAMINES, UR SCREEN: NOT DETECTED
BENZODIAZEPINE, UR SCRN: POSITIVE — AB
Barbiturates, Ur Screen: NOT DETECTED
Cannabinoid 50 Ng, Ur ~~LOC~~: POSITIVE — AB
Cocaine Metabolite,Ur ~~LOC~~: NOT DETECTED
MDMA (Ecstasy)Ur Screen: NOT DETECTED
METHADONE SCREEN, URINE: NOT DETECTED
OPIATE, UR SCREEN: NOT DETECTED
Phencyclidine (PCP) Ur S: NOT DETECTED
Tricyclic, Ur Screen: NOT DETECTED

## 2016-12-06 LAB — SALICYLATE LEVEL: Salicylate Lvl: <7 mg/dL (ref 2.8–30.0)

## 2016-12-06 LAB — LITHIUM LEVEL

## 2016-12-06 LAB — COMPREHENSIVE METABOLIC PANEL
ALBUMIN: 4.3 g/dL (ref 3.5–5.0)
ALT: 26 U/L (ref 17–63)
ANION GAP: 6 (ref 5–15)
AST: 21 U/L (ref 15–41)
Alkaline Phosphatase: 63 U/L (ref 38–126)
BILIRUBIN TOTAL: 0.8 mg/dL (ref 0.3–1.2)
BUN: 12 mg/dL (ref 6–20)
CHLORIDE: 102 mmol/L (ref 101–111)
CO2: 26 mmol/L (ref 22–32)
Calcium: 9.1 mg/dL (ref 8.9–10.3)
Creatinine, Ser: 1.3 mg/dL — ABNORMAL HIGH (ref 0.61–1.24)
GFR calc Af Amer: >60 mL/min (ref >60)
GFR calc non Af Amer: >60 mL/min (ref >60)
GLUCOSE: 102 mg/dL — AB (ref 65–99)
POTASSIUM: 3.9 mmol/L (ref 3.5–5.1)
SODIUM: 134 mmol/L — AB (ref 135–145)
TOTAL PROTEIN: 7 g/dL (ref 6.5–8.1)

## 2016-12-06 LAB — ETHANOL: Alcohol, Ethyl (B): <5 mg/dL (ref <5)

## 2016-12-06 LAB — ACETAMINOPHEN LEVEL: Acetaminophen (Tylenol), Serum: <10 ug/mL — ABNORMAL LOW (ref 10–30)

## 2016-12-06 NOTE — ED Triage Notes (Signed)
Per ems pt smoked some "bad weed" possibly laced with something, and ate unknown berries off the ground. Pt is loud, labile, hx of schizophrenia and paranoia per ems

## 2016-12-06 NOTE — ED Notes (Signed)
Pt tried to call grandmother for ride and left message. Pt came to the desk and said that he wanted to walk home. Pt told that it was probably 1-1.5 miles to his apartment. Pt still wanted to walk. Discharge paperwork reviewed and pt left the ED.

## 2016-12-06 NOTE — ED Provider Notes (Signed)
[facility] Emergency Department Provider Note  ____________________________________________   I have reviewed the triage vital signs and the nursing notes.   HISTORY  Chief Complaint Ingestion and Psychiatric Evaluation    HPI Anthony Luna is a 31 y.o. male who presents today complaining of having eaten a blackberry and smoke some marijuana and then felt we have briefly. He now feels fine. He would like to go home. No SI no HI, not hearing voices taking his medication, he has no complaints at this time. Initially the marijuana was*that he expected and he felt like that might be part of the problem but now he feels that it was just good marijuana.     Past Medical History:  Diagnosis Date  . None to low serum cortisol response with adrenocorticotrophic hormone (ACTH) stimulation test   . Schizophrenia, paranoid type [facility](HCC)     Patient Active Problem List   Diagnosis Date Noted  . Cannabis use disorder, severe, dependence (HCC) 11/30/2014  . Schizoaffective disorder, bipolar type (HCC) 11/30/2014  . Tobacco use disorder 11/30/2014    History reviewed. No pertinent surgical history.  Prior to Admission medications   Medication Sig Start Date End Date Taking? Authorizing Provider  amantadine (SYMMETREL) 100 MG capsule Take 1 capsule (100 mg total) by mouth 2 (two) times daily. 12/08/14   Jimmy FootmanHernandez-Gonzalez, Andrea, MD  clonazePAM (KLONOPIN) 0.5 MG tablet Take 1 tablet (0.5 mg total) by mouth at bedtime. 12/22/14   Jimmy FootmanHernandez-Gonzalez, Andrea, MD  fluPHENAZine (PROLIXIN) 10 MG tablet Take 1 tablet (10 mg total) by mouth 2 (two) times daily before a meal. 12/22/14   Jimmy FootmanHernandez-Gonzalez, Andrea, MD  fluPHENAZine decanoate (PROLIXIN) 25 MG/ML injection Inject 2 mLs (50 mg total) into the muscle every 14 (fourteen) days. 01/02/15   Jimmy FootmanHernandez-Gonzalez, Andrea, MD  hydrOXYzine (ATARAX/VISTARIL) 25 MG tablet Take 1 tablet (25 mg total) by mouth 3 (three) times  daily as needed for itching. 06/04/15   Rebecka ApleyWebster, Allison P, MD  lithium carbonate (ESKALITH) 450 MG CR tablet Take 1 tablet (450 mg total) by mouth every 12 (twelve) hours. 12/11/14   Jimmy FootmanHernandez-Gonzalez, Andrea, MD    Allergies Patient has no known allergies.  History reviewed. No pertinent family history.  Social History Social History  Substance Use Topics  . Smoking status: Current Every Day Smoker    Packs/day: 0.50    Types: Cigarettes    Start date: 11/29/2002  . Smokeless tobacco: Current User  . Alcohol use No    Review of Systems Constitutional: No fever/chills Eyes: No visual changes. ENT: No sore throat. No stiff neck no neck pain Cardiovascular: Denies chest pain. Respiratory: Denies shortness of breath. Gastrointestinal:   no vomiting.  No diarrhea.  No constipation. Genitourinary: Negative for dysuria. Musculoskeletal: Negative lower extremity swelling Skin: Negative for rash. Neurological: Negative for severe headaches, focal weakness or numbness.   ____________________________________________   PHYSICAL EXAM:  VITAL SIGNS: ED Triage Vitals  Enc Vitals Group     BP 12/06/16 1126 103/71     Pulse Rate 12/06/16 1126 (!) 110     Resp 12/06/16 1126 18     Temp 12/06/16 1126 98.5 F (36.9 C)     Temp src --      SpO2 12/06/16 1126 98 %     Weight 12/06/16 1127 190 lb (86.2 kg)     Height 12/06/16 1127 6' (1.829 m)     Head Circumference --      Peak Flow --  Pain Score --      Pain Loc --      Pain Edu? --      Excl. in GC? --     Constitutional: Alert and oriented. Well appearing and in no acute distress. Eyes: Conjunctivae are normal Head: Atraumatic HEENT: No congestion/rhinnorhea. Mucous membranes are moist.  Oropharynx non-erythematous Neck:   Nontender with no meningismus, no masses, no stridor Cardiovascular: Normal rate, regular rhythm. Grossly normal heart sounds.  Good peripheral circulation. Respiratory: Normal respiratory effort.   No retractions. Lungs CTAB. Abdominal: Soft and nontender. No distention. No guarding no rebound Back:  There is no focal tenderness or step off.  there is no midline tenderness there are no lesions noted. there is no CVA tenderness Musculoskeletal: No lower extremity tenderness, no upper extremity tenderness. No joint effusions, no DVT signs strong distal pulses no edema Neurologic:  Normal speech and language. No gross focal neurologic deficits are appreciated.  Skin:  Skin is warm, dry and intact. No rash noted. Psychiatric: Mood and affect are normal. Speech and behavior are normal.  ____________________________________________   LABS (all labs ordered are listed, but only abnormal results are displayed)  Labs Reviewed  CBC WITH DIFFERENTIAL/PLATELET  COMPREHENSIVE METABOLIC PANEL  ETHANOL  URINE DRUG SCREEN, QUALITATIVE ([facility] ONLY)  SALICYLATE LEVEL  ACETAMINOPHEN LEVEL  LITHIUM LEVEL   ____________________________________________  EKG  I personally interpreted any EKGs ordered by me or triage  ____________________________________________  RADIOLOGY  I reviewed any imaging ordered by me or triage that were performed during my shift and, if possible, patient and/or family made aware of any abnormal findings. ____________________________________________   PROCEDURES  Procedure(s) performed: None  Procedures  Critical Care performed: None  ____________________________________________   INITIAL IMPRESSION / ASSESSMENT AND PLAN / ED COURSE  Pertinent labs & imaging results that were available during my care of the patient were reviewed by me and considered in my medical decision making (see chart for details).   Patient here after eating a blackberry and smoking some marijuana and feeling weird now he doesn't feel weird and he was to go home. No SI no HI no other components    ____________________________________________   FINAL CLINICAL IMPRESSION(S) / ED  DIAGNOSES  Final diagnoses:  None      This chart was dictated using voice recognition software.  Despite best efforts to proofread,  errors can occur which can change meaning.      Jeanmarie Plant, MD 12/06/16 [phone]

## 2017-04-07 ENCOUNTER — Inpatient Hospital Stay
Admission: AD | Admit: 2017-04-07 | Discharge: 2017-04-13 | DRG: 885 | Disposition: A | Discharge status: Home / Self Care | Payer: Medicaid Other | Source: Intra-hospital | Attending: Psychiatry | Admitting: Psychiatry

## 2017-04-07 ENCOUNTER — Emergency Department
Admission: EM | Admit: 2017-04-07 | Discharge: 2017-04-07 | Disposition: A | Discharge status: Other Acute Inpatient Hospital | Payer: Medicaid Other | Attending: Emergency Medicine | Admitting: Emergency Medicine

## 2017-04-07 ENCOUNTER — Encounter: Payer: Self-pay | Admitting: *Deleted

## 2017-04-07 DIAGNOSIS — F17210 Nicotine dependence, cigarettes, uncomplicated: Secondary | ICD-10-CM | POA: Diagnosis present

## 2017-04-07 DIAGNOSIS — F1220 Cannabis dependence, uncomplicated: Secondary | ICD-10-CM | POA: Diagnosis present

## 2017-04-07 DIAGNOSIS — Z79899 Other long term (current) drug therapy: Secondary | ICD-10-CM

## 2017-04-07 DIAGNOSIS — Z23 Encounter for immunization: Secondary | ICD-10-CM | POA: Diagnosis not present

## 2017-04-07 DIAGNOSIS — F4310 Post-traumatic stress disorder, unspecified: Secondary | ICD-10-CM | POA: Diagnosis present

## 2017-04-07 DIAGNOSIS — G4700 Insomnia, unspecified: Secondary | ICD-10-CM | POA: Diagnosis present

## 2017-04-07 DIAGNOSIS — F23 Brief psychotic disorder: Secondary | ICD-10-CM | POA: Insufficient documentation

## 2017-04-07 DIAGNOSIS — F17200 Nicotine dependence, unspecified, uncomplicated: Secondary | ICD-10-CM | POA: Diagnosis present

## 2017-04-07 DIAGNOSIS — R440 Auditory hallucinations: Secondary | ICD-10-CM | POA: Diagnosis present

## 2017-04-07 DIAGNOSIS — F250 Schizoaffective disorder, bipolar type: Secondary | ICD-10-CM

## 2017-04-07 LAB — COMPREHENSIVE METABOLIC PANEL
ALBUMIN: 4.3 g/dL (ref 3.5–5.0)
ALK PHOS: 82 U/L (ref 38–126)
ALT: 20 U/L (ref 17–63)
AST: 20 U/L (ref 15–41)
Anion gap: 8 (ref 5–15)
BUN: 13 mg/dL (ref 6–20)
CALCIUM: 9.3 mg/dL (ref 8.9–10.3)
CHLORIDE: 103 mmol/L (ref 101–111)
CO2: 28 mmol/L (ref 22–32)
CREATININE: 1.31 mg/dL — AB (ref 0.61–1.24)
GFR calc non Af Amer: >60 mL/min (ref >60)
GLUCOSE: 113 mg/dL — AB (ref 65–99)
Potassium: 4 mmol/L (ref 3.5–5.1)
SODIUM: 139 mmol/L (ref 135–145)
Total Bilirubin: 0.6 mg/dL (ref 0.3–1.2)
Total Protein: 7.7 g/dL (ref 6.5–8.1)

## 2017-04-07 LAB — URINE DRUG SCREEN, QUALITATIVE (ARMC ONLY)
AMPHETAMINES, UR SCREEN: NOT DETECTED
BARBITURATES, UR SCREEN: NOT DETECTED
Benzodiazepine, Ur Scrn: NOT DETECTED
Cannabinoid 50 Ng, Ur ~~LOC~~: POSITIVE — AB
Cocaine Metabolite,Ur ~~LOC~~: NOT DETECTED
MDMA (Ecstasy)Ur Screen: NOT DETECTED
Methadone Scn, Ur: NOT DETECTED
OPIATE, UR SCREEN: NOT DETECTED
Phencyclidine (PCP) Ur S: NOT DETECTED
Tricyclic, Ur Screen: NOT DETECTED

## 2017-04-07 LAB — CBC
HEMATOCRIT: 46.1 % (ref 40.0–52.0)
Hemoglobin: 15.6 g/dL (ref 13.0–18.0)
MCH: 31.7 pg (ref 26.0–34.0)
MCHC: 33.8 g/dL (ref 32.0–36.0)
MCV: 93.7 fL (ref 80.0–100.0)
Platelets: 181 10*3/uL (ref 150–440)
RBC: 4.92 MIL/uL (ref 4.40–5.90)
RDW: 13.5 % (ref 11.5–14.5)
WBC: 6.2 10*3/uL (ref 3.8–10.6)

## 2017-04-07 LAB — ETHANOL: Alcohol, Ethyl (B): <10 mg/dL (ref <10)

## 2017-04-07 LAB — LITHIUM LEVEL
LITHIUM LVL: 0.26 mmol/L — AB (ref 0.60–1.20)
Lithium Lvl: <0.06 mmol/L — ABNORMAL LOW (ref 0.60–1.20)

## 2017-04-07 MED ORDER — LITHIUM CARBONATE ER 450 MG PO TBCR
450.0000 mg | EXTENDED_RELEASE_TABLET | Freq: Two times a day (BID) | ORAL | Status: DC
  Administered 2017-04-07: 450 mg via ORAL
  Filled 2017-04-07 (×3): qty 1

## 2017-04-07 MED ORDER — CLONAZEPAM 0.5 MG PO TABS: 0.5000 mg | ORAL_TABLET | Freq: Every day | ORAL | Status: DC

## 2017-04-07 MED ORDER — CLONAZEPAM 0.5 MG PO TABS
0.5000 mg | ORAL_TABLET | Freq: Once | ORAL | Status: AC
  Administered 2017-04-07: 0.5 mg via ORAL
  Filled 2017-04-07: qty 1

## 2017-04-07 MED ORDER — FLUPHENAZINE HCL 10 MG PO TABS
10.0000 mg | ORAL_TABLET | Freq: Two times a day (BID) | ORAL | Status: DC
  Administered 2017-04-07: 10 mg via ORAL
  Filled 2017-04-07 (×3): qty 1

## 2017-04-07 MED ORDER — MAGNESIUM HYDROXIDE 400 MG/5ML PO SUSP: 30.0000 mL | Freq: Every day | ORAL | Status: DC | PRN

## 2017-04-07 MED ORDER — HYDROXYZINE HCL 25 MG PO TABS
25.0000 mg | ORAL_TABLET | Freq: Three times a day (TID) | ORAL | Status: DC | PRN
  Administered 2017-04-09: 25 mg via ORAL
  Filled 2017-04-07: qty 1

## 2017-04-07 MED ORDER — ALUM & MAG HYDROXIDE-SIMETH 200-200-20 MG/5ML PO SUSP: 30.0000 mL | ORAL | Status: DC | PRN

## 2017-04-07 MED ORDER — AMANTADINE HCL 100 MG PO CAPS
100.0000 mg | ORAL_CAPSULE | Freq: Two times a day (BID) | ORAL | Status: DC
  Administered 2017-04-07: 100 mg via ORAL
  Filled 2017-04-07 (×3): qty 1

## 2017-04-07 MED ORDER — FAMOTIDINE 20 MG PO TABS
ORAL_TABLET | ORAL | Status: AC
  Filled 2017-04-07: qty 1

## 2017-04-07 MED ORDER — METFORMIN HCL 500 MG PO TABS
ORAL_TABLET | ORAL | Status: AC
  Filled 2017-04-07: qty 1

## 2017-04-07 MED ORDER — INFLUENZA VAC SPLIT QUAD 0.5 ML IM SUSY
0.5000 mL | PREFILLED_SYRINGE | INTRAMUSCULAR | Status: AC
  Administered 2017-04-08: 0.5 mL via INTRAMUSCULAR
  Filled 2017-04-07: qty 0.5

## 2017-04-07 MED ORDER — FLUPHENAZINE HCL 5 MG PO TABS
10.0000 mg | ORAL_TABLET | Freq: Two times a day (BID) | ORAL | Status: DC
  Administered 2017-04-07 – 2017-04-13 (×12): 10 mg via ORAL
  Filled 2017-04-07 (×12): qty 2

## 2017-04-07 MED ORDER — LITHIUM CARBONATE ER 450 MG PO TBCR
450.0000 mg | EXTENDED_RELEASE_TABLET | Freq: Two times a day (BID) | ORAL | Status: DC
  Administered 2017-04-07 – 2017-04-11 (×8): 450 mg via ORAL
  Filled 2017-04-07 (×7): qty 1

## 2017-04-07 MED ORDER — TRAZODONE HCL 100 MG PO TABS
100.0000 mg | ORAL_TABLET | Freq: Every evening | ORAL | Status: DC | PRN
  Administered 2017-04-09: 100 mg via ORAL
  Filled 2017-04-07: qty 1

## 2017-04-07 MED ORDER — AMANTADINE HCL 100 MG PO CAPS
100.0000 mg | ORAL_CAPSULE | Freq: Two times a day (BID) | ORAL | Status: DC
  Administered 2017-04-07 – 2017-04-13 (×12): 100 mg via ORAL
  Filled 2017-04-07 (×12): qty 1

## 2017-04-07 MED ORDER — ACETAMINOPHEN 325 MG PO TABS
650.0000 mg | ORAL_TABLET | Freq: Four times a day (QID) | ORAL | Status: DC | PRN
  Administered 2017-04-08: 650 mg via ORAL
  Filled 2017-04-07: qty 2

## 2017-04-07 MED ORDER — FLUPHENAZINE HCL 10 MG PO TABS: 10.0000 mg | ORAL_TABLET | Freq: Every day | ORAL | Status: DC

## 2017-04-07 NOTE — ED Notes (Signed)
Pt's wallet , keys, phone and keys sent with Peer Support  Pt's shirt, jacket and pants bagged in belongings bag

## 2017-04-07 NOTE — ED Notes (Signed)
Psychiatrist  [name] is currently in the room

## 2017-04-07 NOTE — BH Assessment (Addendum)
[facility] Dr. Toni Amendlapacs.  Attending Physician will be Dr. Johnella MoloneyMcNew.   Patient has been assigned to room 322, by [facility] Charge Nurse [name].   Pt RN (Amy), EDP Dr.Stafford, ED Secretary Rivka Barbara(Glenda), and pt access [employer](Josh) informed. Support papers signed and placed on pt chart.

## 2017-04-07 NOTE — Consult Note (Signed)
[facility] Consult   Reason for Consult: Consult for 31 year old man with a history of schizophrenia who came voluntarily to the emergency room because of worsening psychotic symptoms Referring Physician: Joni Fears Patient Identification: Anthony Luna MRN:  [id number] Principal Diagnosis: Schizoaffective disorder, bipolar type [facility]) Diagnosis:   Patient Active Problem List   Diagnosis Date Noted  . Cannabis use disorder, severe, dependence ([place]) [F12.20] 11/30/2014  . Schizoaffective disorder, bipolar type ([place]) [F25.0] 11/30/2014  . Tobacco use disorder [F17.200] 11/30/2014    Total Time spent with patient: 1 hour  Subjective:   Anthony Luna is a 31 y.o. male patient admitted with "I am feeling afraid back at my apartment".  HPI: Patient interviewed chart reviewed.  31 year old man with a history of schizophrenia was brought to the emergency room by his peers support worker.  Patient reports that for the past couple weeks he has been feeling more nervous and anxious.  He describes himself as feeling frightened at his apartment.  He says he feels "unsafe".  When I asked him what he felt he was in danger from he was not able to be very clear but he does say that he is having auditory hallucinations which tell him to kill himself.  He does not want to kill himself does not want to hurt anybody else.  Patient is disorganized in his thinking with pretty significant thought blocking.  He says that he does skip his medicine every now and then even though he tries to be regular with it.  He admits that he has been smoking marijuana pretty frequently regularly recently.  Social history: Patient has been living independently in a supervised apartment for about 9 months.  Has a peer support worker and an act team.  He says that overall he likes it.  He gets his treatment from [facility].  Apparently he is still on the long-acting Prolixin shot as well as oral  Prolixin and lithium.  Medical history: No significant medical problems outside of the psychiatric  Substance abuse history: History of repeated misuse of cannabis which tends to make his psychotic symptoms significantly worse.  Past Psychiatric History: Patient has had prior hospitalizations.  He was last here for inpatient treatment about 2 years ago.  Since then he has been fairly stable on antipsychotics.  Has a history of some suicidal ideation when psychotic denies any history of actually trying to kill himself denies any history of violence.  Risk to Self: Suicidal Ideation: No Suicidal Intent: No Is patient at risk for suicide?: No Suicidal Plan?: No Access to Means: No What has been your use of drugs/alcohol within the last 12 months?: Pt reports THC use 2-3x/wk Other Self Harm Risks: Continued THC use despite h/o adverse reactions Intentional Self Injurious Behavior: None Risk to Others: Homicidal Ideation: No Thoughts of Harm to Others: No Current Homicidal Intent: No Current Homicidal Plan: No Access to Homicidal Means: No History of harm to others?: No Assessment of Violence: None Noted Does patient have access to weapons?: No Criminal Charges Pending?: No Does patient have a court date: No Prior Inpatient Therapy:   Prior Outpatient Therapy:    Past Medical History:  Past Medical History:  Diagnosis Date  . None to low serum cortisol response with adrenocorticotrophic hormone (ACTH) stimulation test   . Schizophrenia, paranoid type ([place])    History reviewed. No pertinent surgical history. Family History: No family history on file. Family Psychiatric  History: He is not aware of any  family history and says that he has very little contact with any of his family Social History:  History  Alcohol Use No     History  Drug Use  . Types: Marijuana    Social History   Social History  . Marital status: Single    Spouse name: N/A  . Number of children: N/A  .  Years of education: N/A   Social History Main Topics  . Smoking status: Current Every Day Smoker    Packs/day: 0.50    Types: Cigarettes    Start date: 11/29/2002  . Smokeless tobacco: Current User  . Alcohol use No  . Drug use: Yes    Types: Marijuana  . Sexual activity: Not Asked   Other Topics Concern  . None   Social History Narrative  . None   Additional Social History:    Allergies:  No Known Allergies  Labs:  Results for orders placed or performed during the hospital encounter of 04/07/17 (from the past 48 hour(s))  Comprehensive metabolic panel     Status: Abnormal   Collection Time: 04/07/17  9:44 AM  Result Value Ref Range   Sodium 139 135 - 145 mmol/L   Potassium 4.0 3.5 - 5.1 mmol/L   Chloride 103 101 - 111 mmol/L   CO2 28 22 - 32 mmol/L   Glucose, Bld 113 (H) 65 - 99 mg/dL   BUN 13 6 - 20 mg/dL   Creatinine, Ser 1.31 (H) 0.61 - 1.24 mg/dL   Calcium 9.3 8.9 - 10.3 mg/dL   Total Protein 7.7 6.5 - 8.1 g/dL   Albumin 4.3 3.5 - 5.0 g/dL   AST 20 15 - 41 U/L   ALT 20 17 - 63 U/L   Alkaline Phosphatase 82 38 - 126 U/L   Total Bilirubin 0.6 0.3 - 1.2 mg/dL   GFR calc non Af Amer >60 >60 mL/min   GFR calc Af Amer >60 >60 mL/min    Comment: (NOTE) The eGFR has been calculated using the CKD EPI equation. This calculation has not been validated in all clinical situations. eGFR's persistently <60 mL/min signify possible Chronic Kidney Disease.    Anion gap 8 5 - 15  Ethanol     Status: None   Collection Time: 04/07/17  9:44 AM  Result Value Ref Range   Alcohol, Ethyl (B) <10 <10 mg/dL    Comment:        LOWEST DETECTABLE LIMIT FOR SERUM ALCOHOL IS 10 mg/dL FOR MEDICAL PURPOSES ONLY   cbc     Status: None   Collection Time: 04/07/17  9:44 AM  Result Value Ref Range   WBC 6.2 3.8 - 10.6 K/uL   RBC 4.92 4.40 - 5.90 MIL/uL   Hemoglobin 15.6 13.0 - 18.0 g/dL   HCT 46.1 40.0 - 52.0 %   MCV 93.7 80.0 - 100.0 fL   MCH 31.7 26.0 - 34.0 pg   MCHC 33.8 32.0 -  36.0 g/dL   RDW 13.5 11.5 - 14.5 %   Platelets 181 150 - 440 K/uL  Urine Drug Screen, Qualitative     Status: Abnormal   Collection Time: 04/07/17  9:46 AM  Result Value Ref Range   Tricyclic, Ur Screen NONE DETECTED NONE DETECTED   Amphetamines, Ur Screen NONE DETECTED NONE DETECTED   MDMA (Ecstasy)Ur Screen NONE DETECTED NONE DETECTED   Cocaine Metabolite,Ur [place] NONE DETECTED NONE DETECTED   Opiate, Ur Screen NONE DETECTED NONE DETECTED   Phencyclidine (PCP) Ur  S NONE DETECTED NONE DETECTED   Cannabinoid 50 Ng, Ur [place] POSITIVE (A) NONE DETECTED   Barbiturates, Ur Screen NONE DETECTED NONE DETECTED   Benzodiazepine, Ur Scrn NONE DETECTED NONE DETECTED   Methadone Scn, Ur NONE DETECTED NONE DETECTED    Comment: (NOTE) 694  Tricyclics, urine               Cutoff 1000 ng/mL 200  Amphetamines, urine             Cutoff 1000 ng/mL 300  MDMA (Ecstasy), urine           Cutoff 500 ng/mL 400  Cocaine Metabolite, urine       Cutoff 300 ng/mL 500  Opiate, urine                   Cutoff 300 ng/mL 600  Phencyclidine (PCP), urine      Cutoff 25 ng/mL 700  Cannabinoid, urine              Cutoff 50 ng/mL 800  Barbiturates, urine             Cutoff 200 ng/mL 900  Benzodiazepine, urine           Cutoff 200 ng/mL 1000 Methadone, urine                Cutoff 300 ng/mL 1100 1200 The urine drug screen provides only a preliminary, unconfirmed 1300 analytical test result and should not be used for non-medical 1400 purposes. Clinical consideration and professional judgment should 1500 be applied to any positive drug screen result due to possible 1600 interfering substances. A more specific alternate chemical method 1700 must be used in order to obtain a confirmed analytical result.  1800 Gas chromato graphy / mass spectrometry (GC/MS) is the preferred 1900 confirmatory method.     Current Facility-Administered Medications  Medication Dose Route Frequency Provider Last Rate Last Dose  . amantadine  (SYMMETREL) capsule 100 mg  100 mg Oral BID [name], [name], MD      . clonazePAM (KLONOPIN) tablet 0.5 mg  0.5 mg Oral QHS [name], [name], MD      . fluPHENAZine (PROLIXIN) tablet 10 mg  10 mg Oral BID [name], [name], MD      . lithium carbonate (ESKALITH) CR tablet 450 mg  450 mg Oral Q12H [name], [name], MD       Current Outpatient Prescriptions  Medication Sig Dispense Refill  . amantadine (SYMMETREL) 100 MG capsule Take 1 capsule (100 mg total) by mouth 2 (two) times daily. 60 capsule 0  . clonazePAM (KLONOPIN) 0.5 MG tablet Take 1 tablet (0.5 mg total) by mouth at bedtime. 30 tablet 0  . fluPHENAZine (PROLIXIN) 10 MG tablet Take 1 tablet (10 mg total) by mouth 2 (two) times daily before a meal. 60 tablet 0  . fluPHENAZine decanoate (PROLIXIN) 25 MG/ML injection Inject 2 mLs (50 mg total) into the muscle every 14 (fourteen) days. 5 mL 0  . hydrOXYzine (ATARAX/VISTARIL) 25 MG tablet Take 1 tablet (25 mg total) by mouth 3 (three) times daily as needed for itching. 15 tablet 0  . lithium carbonate (ESKALITH) 450 MG CR tablet Take 1 tablet (450 mg total) by mouth every 12 (twelve) hours. 60 tablet 0    Musculoskeletal: Strength & Muscle Tone: within normal limits Gait & Station: normal Patient leans: N/A  Psychiatric Specialty Exam: Physical Exam  Nursing note and vitals reviewed. Constitutional: He appears well-developed and well-nourished.  HENT:  Head: Normocephalic and atraumatic.  Eyes: Pupils are equal, round, and reactive to light. Conjunctivae are normal.  Neck: Normal range of motion.  Cardiovascular: Regular rhythm and normal heart sounds.   Respiratory: Effort normal. No respiratory distress.  GI: Soft.  Musculoskeletal: Normal range of motion.  Neurological: He is alert.  Skin: Skin is warm and dry.  Psychiatric: His mood appears anxious. His affect is blunt. His speech is delayed and tangential. He is slowed and withdrawn. Thought content is paranoid. He expresses  impulsivity. He expresses suicidal ideation. He expresses no suicidal plans. He exhibits abnormal recent memory.    Review of Systems  Constitutional: Negative.   HENT: Negative.   Eyes: Negative.   Respiratory: Negative.   Cardiovascular: Negative.   Gastrointestinal: Negative.   Musculoskeletal: Negative.   Skin: Negative.   Neurological: Negative.   Psychiatric/Behavioral: Positive for hallucinations, substance abuse and suicidal ideas. Negative for depression and memory loss. The patient is nervous/anxious and has insomnia.     Blood pressure 111/74, pulse 98, resp. rate 20, height _0  (1.854 m), weight 195 lb (88.5 kg), SpO2 98 %.Body mass index is 25.73 kg/m.  General Appearance: Fairly Groomed  Eye Contact:  Good  Speech:  Blocked and Slow  Volume:  Decreased  Mood:  Anxious  Affect:  Congruent and Constricted  Thought Process:  Disorganized  Orientation:  Full (Time, Place, and Person)  Thought Content:  Illogical, Hallucinations: Auditory and Paranoid Ideation  Suicidal Thoughts:  Yes.  without intent/plan  Homicidal Thoughts:  No  Memory:  Immediate;   Good Recent;   Fair Remote;   Fair  Judgement:  Fair  Insight:  Fair  Psychomotor Activity:  Decreased  Concentration:  Concentration: Fair  Recall:  [employer] of Knowledge:  Fair  Language:  Fair  Akathisia:  No  Handed:  Right  AIMS (if indicated):     Assets:  Communication Skills Desire for Improvement Financial Resources/Insurance Housing Physical Health Resilience  ADL's:  Intact  Cognition:  WNL  Sleep:        Treatment Plan Summary: Daily contact with patient to assess and evaluate symptoms and progress in treatment, Medication management and Plan 31 year old man with schizophrenia who seems to be decompensated from his usual state.  He shows significant thought blocking and confusion.  Has trouble holding his thoughts together to have a conversation.  He is having worsening hallucinations with  suicidal command to them although he has no plan or intent to hurt himself.  Patient is agreeable to a plan to admit him to the psychiatric hospital.  I will order a lithium level and also an EKG.  Full set of labs will be done.  For now continue his Prolixin.  I do not know when he last got his injection so we will defer adding another one for now.  15-minute checks in place.  Case reviewed with ER physician and TTS  Disposition: Recommend psychiatric Inpatient admission when medically cleared. Supportive therapy provided about ongoing stressors.  Alethia Berthold, MD 04/07/2017 1:10 PM

## 2017-04-07 NOTE — BH Assessment (Signed)
Assessment Note  Anthony Luna is an 31 y.o. male transported to by his peer support worker. Pt reports AH w/command to harm/kill himself. Pt denies Si intent and plan. Pt denies h/o suicide attempt. Pt reports h/o of multiple inpatient admissions.  Pt reports THC use 2-3x/wk. Pt reports h/o THC induced paranoia and states that when he smokes marijuana "I just freak out a little bit". Pt  has h/o schizophrenia. Pt initially reports medication compliance and then admits to skipping some doses. Pt is followed by Dr.Headen and [facility] ACT team. Pt denies HI and commands to harm others.    Diagnosis: Schizoaffective d/o, bipolar type  Past Medical History:  Past Medical History:  Diagnosis Date  . None to low serum cortisol response with adrenocorticotrophic hormone (ACTH) stimulation test   . Schizophrenia, paranoid type ([facility])     History reviewed. No pertinent surgical history.  Family History: No family history on file.  Social History:  reports that he has been smoking Cigarettes.  He started smoking about 14 years ago. He has been smoking about 0.50 packs per day. He uses smokeless tobacco. He reports that he uses drugs, including Marijuana. He reports that he does not drink alcohol.  Additional Social History:  Alcohol / Drug Use Pain Medications: Pt denies abuse. Prescriptions: Pt denies abuse. Over the Counter: Pt denies abuse. History of alcohol / drug use?: Yes Longest period of sobriety (when/how long): Not Reported Negative Consequences of Use:  (None Endorsed) Withdrawal Symptoms:  (None Reported) Substance #1 Name of Substance 1: THC 1 - Age of First Use: Not Reported 1 - Amount (size/oz): Not Reported 1 - Frequency: 2-3x/wk 1 - Duration: Ongoing 1 - Last Use / Amount: PTA/Not Reported  CIWA: CIWA-Ar BP: 111/74 Pulse Rate: 98 COWS:    Allergies: No Known Allergies  Home Medications:  (Not in a hospital admission)  OB/GYN Status:  No LMP for male  patient.  General Assessment Data [facility] Assessment: In system Is this a Tele or Face-to-Face Assessment?: Face-to-Face Is this an Initial Assessment or a Re-assessment for this encounter?: Initial Assessment Marital status: Single Is patient pregnant?: No Pregnancy Status: No Living Arrangements: Alone Can pt return to current living arrangement?: Yes Admission Status: Voluntary Is patient capable of signing voluntary admission?: Yes Referral Source: Self/Family/Friend Insurance type: Medicaid     Crisis Care Plan Living Arrangements: Alone Name of Psychiatrist: [facility] Name of Therapist: [facility]  Education Status Is patient currently in school?: No Highest grade of school patient has completed: [employer]  Risk to self with the past 6 months Suicidal Ideation: No Has patient been a risk to self within the past 6 months prior to admission? : No Suicidal Intent: No Has patient had any suicidal intent within the past 6 months prior to admission? : No Is patient at risk for suicide?: No Suicidal Plan?: No Has patient had any suicidal plan within the past 6 months prior to admission? : No Access to Means: No What has been your use of drugs/alcohol within the last 12 months?: Pt reports THC use 2-3x/wk Previous Attempts/Gestures: No Other Self Harm Risks: Continued THC use despite h/o adverse reactions Intentional Self Injurious Behavior: None Family Suicide History: No Recent stressful life event(s):  (None Reported) Persecutory voices/beliefs?: No Depression: No Substance abuse history and/or treatment for substance abuse?: No Suicide prevention information given to non-admitted patients: Not applicable  Risk to Others within the  past 6 months Homicidal Ideation: No Does patient have any lifetime risk of violence toward others beyond the six months prior to admission? : No Thoughts of Harm to Others:  No Current Homicidal Intent: No Current Homicidal Plan: No Access to Homicidal Means: No History of harm to others?: No Assessment of Violence: None Noted Does patient have access to weapons?: No Criminal Charges Pending?: No Does patient have a court date: No Is patient on probation?: No  Psychosis Hallucinations: Auditory, With command (w/command to harm/kill self) Delusions: None noted  Mental Status Report Appearance/Hygiene: In scrubs Eye Contact: Good Motor Activity: Unremarkable Speech: Slow Level of Consciousness: Alert Mood: Anxious Affect: Constricted Anxiety Level: Minimal Thought Processes: Thought Blocking Judgement: Partial Orientation: Person, Place, Situation Obsessive Compulsive Thoughts/Behaviors: None  Cognitive Functioning Concentration: Normal Memory: Recent Intact, Remote Intact IQ: Average Insight: Fair Impulse Control: Fair Appetite: Fair Weight Loss: 3 Weight Gain: 0 Sleep: No Change (difficulty falling and staying asleep) Total Hours of Sleep:  (Not Reported) Vegetative Symptoms: None  ADLScreening [facility] Assessment Services) Patient's cognitive ability adequate to safely complete daily activities?: Yes Patient able to express need for assistance with ADLs?: Yes Independently performs ADLs?: Yes (appropriate for developmental age)  Prior Inpatient Therapy Prior Inpatient Therapy: Yes Prior Therapy Dates: 2016 x 2 Prior Therapy Facilty/Provider(s): [facility] Reason for Treatment: Schizoaffect d/o  Prior Outpatient Therapy Prior Outpatient Therapy: Yes Prior Therapy Dates: Ongoing Prior Therapy Facilty/Provider(s): [facility] Reason for Treatment: Schizoaffective d/o Does patient have an ACCT team?: No Does patient have Intensive In-House Services?  : No Does patient have [facility] services? : No Does patient have P4CC services?: No  ADL Screening (condition at time of admission) Patient's cognitive ability adequate to safely  complete daily activities?: Yes Is the patient deaf or have difficulty hearing?: No Does the patient have difficulty seeing, even when wearing glasses/contacts?: No Does the patient have difficulty concentrating, remembering, or making decisions?: Yes Patient able to express need for assistance with ADLs?: Yes Does the patient have difficulty dressing or bathing?: No Independently performs ADLs?: Yes (appropriate for developmental age) Does the patient have difficulty walking or climbing stairs?: No Weakness of Legs: None Weakness of Arms/Hands: None  Home Assistive Devices/Equipment Home Assistive Devices/Equipment: None  Therapy Consults (therapy consults require a physician order) PT Evaluation Needed: No OT Evalulation Needed: No SLP Evaluation Needed: No Abuse/Neglect Assessment (Assessment to be complete while patient is alone) Physical Abuse: Denies Verbal Abuse: Denies Sexual Abuse: Denies Exploitation of patient/patient's resources: Denies Self-Neglect: Denies Values / Beliefs Cultural Requests During Hospitalization: None Spiritual Requests During Hospitalization: None Consults Spiritual Care Consult Needed: No Social Work Consult Needed: No Merchant navy officer (For Healthcare) Does Patient Have a Medical Advance Directive?: No Would patient like information on creating a medical advance directive?: No - Patient declined    Additional Information 1:1 In Past 12 Months?: No CIRT Risk: No Elopement Risk: No Does patient have medical clearance?: Yes     Disposition:  Disposition Initial Assessment Completed for this Encounter: Yes Disposition of Patient: Inpatient treatment program (Admit to [facility] BM[facility] per Dr.[name]) Type of inpatient treatment program: Adult  On Site Evaluation by:   Reviewed with Physician:    Bev Drennen J [place] 04/07/2017 1:37 PM

## 2017-04-07 NOTE — Progress Notes (Signed)
Pleasant and cooperative on approach. Observed in the hallways looking around oddly he appeared to be responding to hallucinations. He was medication compliant on shift. Behavior is somewhat bizzare but when engaged in conversation thought process seems to be logical. He appears to be in bed resting quietly at this time.

## 2017-04-07 NOTE — Tx Team (Signed)
Initial Treatment Plan 04/07/2017 4:34 PM Anthony BarthelBrandon Wayne Virgo [id number]    PATIENT STRESSORS: Financial difficulties Marital or family conflict Substance abuse   PATIENT STRENGTHS: Motivation for treatment/growth Supportive family/friends   PATIENT IDENTIFIED PROBLEMS: Cannabis Use Disorder, Severe  Schizoaffective disorder, bipolar type                   DISCHARGE CRITERIA:  Ability to meet basic life and health needs Adequate post-discharge living arrangements Improved stabilization in mood, thinking, and/or behavior  PRELIMINARY DISCHARGE PLAN: Outpatient therapy Placement in alternative living arrangements  PATIENT/FAMILY INVOLVEMENT: This treatment plan has been presented to and reviewed with the patient, Anthony Luna, and/or family member.  The patient and family have been given the opportunity to ask questions and make suggestions.  Jim DesanctisYawn L Tariya Morrissette, RN 04/07/2017, 4:34 PM

## 2017-04-07 NOTE — ED Notes (Signed)
BEHAVIORAL HEALTH ROUNDING Patient sleeping: No. Patient alert and oriented: yes Behavior appropriate: Yes.  ; If no, describe:  Nutrition and fluids offered: yes Toileting and hygiene offered: Yes  Sitter present: q15 minute observations and security  monitoring Law enforcement present: Yes  ODS  

## 2017-04-07 NOTE — Progress Notes (Signed)
31 year old male admitted to unit. Denies SI/HI, AVH. Patient reports that current stressor is the fact that he can not see his family much because he moved away from them. States, "When I leave here I want to go to a  group home. Patient with sad, flat affect, denies pain and appears to have some thought blocking content due to the delayed responses that nurse encountered during the admission process. Oriented patient to room and unit. Skin and contraband search completed and witnessed by Lorenda HatchetSlade, Charity fundraiserN. No abnormalities noted and skin warm, dry and intact. No contraband found on patient nor his belongings. Admission assessment completed, fluid and nutrition offered. Patient remains safe on the unit with q 15 minute checks.

## 2017-04-07 NOTE — ED Provider Notes (Signed)
[facility] Emergency Department Provider Note  ____________________________________________  Time seen: Approximately 11:06 AM  I have reviewed the triage vital signs and the nursing notes.   HISTORY  Chief Complaint Mental Health Problem  Level 5 Caveat: Portions of the History and Physical are unable to be obtained due to patient being a poor historian   HPI Anthony Luna is a 31 y.o. male brought to the ED by his peer support. Patient reportshe was hearing voices earlier which appear to have a religious undertone. Denies visual hallucinations. Denies HI or SI. Denies any other complaints except for a rash on his neck where he puts on "cheap cologne". Patient feels like his symptoms are due to marijuana, which he last smoked today. Denies any other drug use. Reports compliance with his psychiatric medications although he is dissatisfied with their results.     Past Medical History:  Diagnosis Date  . None to low serum cortisol response with adrenocorticotrophic hormone (ACTH) stimulation test   . Schizophrenia, paranoid type [facility])      Patient Active Problem List   Diagnosis Date Noted  . Cannabis use disorder, severe, dependence (HCC) 11/30/2014  . Schizoaffective disorder, bipolar type (HCC) 11/30/2014  . Tobacco use disorder 11/30/2014     History reviewed. No pertinent surgical history.   Prior to Admission medications   Medication Sig Start Date End Date Taking? Authorizing Provider  amantadine (SYMMETREL) 100 MG capsule Take 1 capsule (100 mg total) by mouth 2 (two) times daily. 12/08/14   Jimmy Footman, MD  clonazePAM (KLONOPIN) 0.5 MG tablet Take 1 tablet (0.5 mg total) by mouth at bedtime. 12/22/14   Jimmy Footman, MD  fluPHENAZine (PROLIXIN) 10 MG tablet Take 1 tablet (10 mg total) by mouth 2 (two) times daily before a meal. 12/22/14   Jimmy Footman, MD  fluPHENAZine decanoate (PROLIXIN) 25  MG/ML injection Inject 2 mLs (50 mg total) into the muscle every 14 (fourteen) days. 01/02/15   Jimmy Footman, MD  hydrOXYzine (ATARAX/VISTARIL) 25 MG tablet Take 1 tablet (25 mg total) by mouth 3 (three) times daily as needed for itching. 06/04/15   Rebecka Apley, MD  lithium carbonate (ESKALITH) 450 MG CR tablet Take 1 tablet (450 mg total) by mouth every 12 (twelve) hours. 12/11/14   Jimmy Footman, MD     Allergies Patient has no known allergies.   No family history on file.  Social History Social History  Substance Use Topics  . Smoking status: Current Every Day Smoker    Packs/day: 0.50    Types: Cigarettes    Start date: 11/29/2002  . Smokeless tobacco: Current User  . Alcohol use No    Review of Systems  Constitutional:   No fever or chills.  ENT:   No sore throat. No rhinorrhea.left lower jaw pain Cardiovascular:   No chest pain or syncope. Respiratory:   No dyspnea or cough. Gastrointestinal:   Negative for abdominal pain, vomiting and diarrhea.  Musculoskeletal:   Negative for focal pain or swelling All other systems reviewed and are negative except as documented above in ROS and HPI.  ____________________________________________   PHYSICAL EXAM:  VITAL SIGNS: ED Triage Vitals  Enc Vitals Group     BP 04/07/17 0943 111/74     Pulse Rate 04/07/17 0943 98     Resp 04/07/17 0943 20     Temp --      Temp src --      SpO2 04/07/17 0943 98 %  Weight 04/07/17 0944 195 lb (88.5 kg)     Height 04/07/17 0944 6\' 1"  (1.854 m)     Head Circumference --      Peak Flow --      Pain Score --      Pain Loc --      Pain Edu? --      Excl. in GC? --     Vital signs reviewed, nursing assessments reviewed.   Constitutional:   Alert and oriented. Well appearing and in no distress. Eyes:   No scleral icterus.  EOMI.  ENT   Head:   Normocephalic and atraumatic.no maxillary swelling or tenderness   Nose:   No congestion/rhinnorhea.     Mouth/Throat:   MMM, no pharyngeal erythema. No peritonsillar mass. no gingival swelling or broken teeth   Neck:   No meningismus. Full ROM. Hematological/Lymphatic/Immunilogical:   No cervical lymphadenopathy. Cardiovascular:   RRR. Symmetric bilateral radial and DP pulses.  No murmurs.  Respiratory:   Normal respiratory effort without tachypnea/retractions. Breath sounds are clear and equal bilaterally. No wheezes/rales/rhonchi. Gastrointestinal:   Soft and nontender. Non distended. There is no CVA tenderness.  No rebound, rigidity, or guarding. Genitourinary:   deferred Musculoskeletal:   Normal range of motion in all extremities. No joint effusions.  No lower extremity tenderness.  No edema. Neurologic:   Normal speech and language. at times patient appears to be responding to internal stimuli Motor grossly intact. No gross focal neurologic deficits are appreciated.  Skin:    Skin is warm, dry and intact. No rash noted.  No petechiae, purpura, or bullae.  ____________________________________________    LABS (pertinent positives/negatives) (all labs ordered are listed, but only abnormal results are displayed) Labs Reviewed  COMPREHENSIVE METABOLIC PANEL - Abnormal; Notable for the following:       Result Value   Glucose, Bld 113 (*)    Creatinine, Ser 1.31 (*)    All other components within normal limits  URINE DRUG SCREEN, QUALITATIVE ([facility] ONLY) - Abnormal; Notable for the following:    Cannabinoid 50 Ng, Ur Butler POSITIVE (*)    All other components within normal limits  ETHANOL  CBC  LITHIUM LEVEL   ____________________________________________   EKG    ____________________________________________    RADIOLOGY  No results found.  ____________________________________________   PROCEDURES Procedures  ____________________________________________   CLINICAL IMPRESSION / ASSESSMENT AND PLAN / ED COURSE  Pertinent labs & imaging results that were  available during my care of the patient were reviewed by me and considered in my medical decision making (see chart for details).   patient well appearing no acute distress, unremarkable vital signs. Medically stable. Likely having decompensation of his underlying psychiatric disease. We'll obtain a psychiatric consult in the ED. Currently patient does not appear to be a danger to himself or others.  Clinical Course as of Apr 07 1325  Tue Apr 07, 2017  1152 D/w psych Dr. Toni Amendlapacs, recommends inpatient psych management. Medically stable to proceed.  [PS]    Clinical Course User Index [PS] Sharman CheekStafford, Danyel Griess, MD     ____________________________________________   FINAL CLINICAL IMPRESSION(S) / ED DIAGNOSES    Final diagnoses:  Acute psychosis (HCC)      New Prescriptions   No medications on file     Portions of this note were generated with dragon dictation software. Dictation errors may occur despite best attempts at proofreading.    Sharman CheekStafford, Dustina Scoggin, MD 04/07/17 1326

## 2017-04-07 NOTE — ED Notes (Signed)
Pt to transfer to inpt bed 305  Report given to T-yawn in [facility]   Pt informed of his pending move/admission  Continue to monitor

## 2017-04-07 NOTE — [facility] Group Notes (Signed)
[facility] Group Notes:  (Nursing/MHT/Case Management/Adjunct)  Date:  04/07/2017  Time:  9:18 PM  Type of Therapy:  Group Therapy  Participation Level:  Active  Participation Quality:  Appropriate  Affect:  Appropriate  Cognitive:  Alert  Insight:  Good  Engagement in Group:  Supportive  Modes of Intervention:  Activity  Summary of Progress/Problems:  Mayra NeerJackie L Nicolle Heward 04/07/2017, 9:18 PM

## 2017-04-07 NOTE — BH Assessment (Signed)
TTS interview completed. Clinician consulted with Dr.[name] and pt is recommended for BMU admission. Pt bed assignment pending unit discharges. EDP Dr.Stafford informed.

## 2017-04-07 NOTE — ED Notes (Signed)
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
## 2017-04-08 DIAGNOSIS — F250 Schizoaffective disorder, bipolar type: Principal | ICD-10-CM

## 2017-04-08 LAB — LIPID PANEL
CHOL/HDL RATIO: 2.9 ratio
Cholesterol: 158 mg/dL (ref 0–200)
HDL: 54 mg/dL (ref >40)
LDL Cholesterol: 95 mg/dL (ref 0–99)
Triglycerides: 45 mg/dL (ref <150)
VLDL: 9 mg/dL (ref 0–40)

## 2017-04-08 LAB — HEMOGLOBIN A1C
Hgb A1c MFr Bld: 5.3 % (ref 4.8–5.6)
Mean Plasma Glucose: 105.41 mg/dL

## 2017-04-08 LAB — TSH: TSH: 2.145 u[IU]/mL (ref 0.350–4.500)
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
## 2017-04-08 NOTE — [facility] Suicide Risk Assessment (Signed)
[facility] INPATIENT:  Family/Significant Other Suicide Prevention Education  Suicide Prevention Education:  Patient Refusal for Family/Significant Other Suicide Prevention Education: The patient Anthony Luna has refused to provide written consent for family/significant other to be provided Family/Significant Other Suicide Prevention Education during admission and/or prior to discharge.  Physician notified.  Cleda DaubSara P Duward Allbritton 04/08/2017, 5:16 PM

## 2017-04-08 NOTE — [facility] Group Notes (Signed)
  [facility] LCSW Group Therapy Note  Date/Time: 04/08/17, 1300  Type of Therapy/Topic:  Group Therapy:  Emotion Regulation  Participation Level:  Active   Mood:pleasant  Description of Group:    The purpose of this group is to assist patients in learning to regulate negative emotions and experience positive emotions. Patients will be guided to discuss ways in which they have been vulnerable to their negative emotions. These vulnerabilities will be juxtaposed with experiences of positive emotions or situations, and patients challenged to use positive emotions to combat negative ones. Special emphasis will be placed on coping with negative emotions in conflict situations, and patients will process healthy conflict resolution skills.  Therapeutic Goals: 1. Patient will identify two positive emotions or experiences to reflect on in order to balance out negative emotions:  2. Patient will label two or more emotions that they find the most difficult to experience:  3. Patient will be able to demonstrate positive conflict resolution skills through discussion or role plays:   Summary of Patient Progress:Pt identified anger as an emotion that is difficult for him to experience.  Pt did not speak much but was attentive and did make several comments as part of the group discussion.       Therapeutic Modalities:   Cognitive Behavioral Therapy Feelings Identification Dialectical Behavioral Therapy  Daleen SquibbGreg Schyler Butikofer, LCSW

## 2017-04-08 NOTE — [facility] Suicide Risk Assessment (Signed)
[facility] Admission Suicide Risk Assessment   Nursing information obtained from:    Demographic factors:    Current Mental Status:    Loss Factors:    Historical Factors:    Risk Reduction Factors:     Total Time spent with patient: 1 hour Principal Problem: Schizoaffective disorder, bipolar type (HCC) Diagnosis:   Patient Active Problem List   Diagnosis Date Noted  . Cannabis use disorder, severe, dependence (HCC) [F12.20] 11/30/2014  . Schizoaffective disorder, bipolar type (HCC) [F25.0] 11/30/2014  . Tobacco use disorder [F17.200] 11/30/2014   Subjective Data: psychotic break  Continued Clinical Symptoms:  Alcohol Use Disorder Identification Test Final Score (AUDIT): 1 The "Alcohol Use Disorders Identification Test", Guidelines for Use in Primary Care, Second Edition.  World Science writer [facility]). Score between 0-7:  no or low risk or alcohol related problems. Score between 8-15:  moderate risk of alcohol related problems. Score between 16-19:  high risk of alcohol related problems. Score 20 or above:  warrants further diagnostic evaluation for alcohol dependence and treatment.   CLINICAL FACTORS:   Alcohol/Substance Abuse/Dependencies Schizophrenia:   Less than 4 years old Paranoid or undifferentiated type   Musculoskeletal: Strength & Muscle Tone: within normal limits Gait & Station: normal Patient leans: N/A  Psychiatric Specialty Exam: Physical Exam  Nursing note and vitals reviewed. Psychiatric: His speech is normal. His mood appears anxious. He is actively hallucinating. Thought content is paranoid and delusional. Cognition and memory are normal. He expresses impulsivity.    Review of Systems  Psychiatric/Behavioral: Positive for hallucinations and substance abuse.  All other systems reviewed and are negative.   Blood pressure 115/62, pulse 87, temperature 98.5 F (36.9 C), temperature source Oral, resp. rate 18, height 6\' 1"  (1.854 m), weight 85.3 kg (188  lb), SpO2 100 %.Body mass index is 24.8 kg/m.  General Appearance: Casual  Eye Contact:  Good  Speech:  Clear and Coherent  Volume:  Normal  Mood:  Anxious  Affect:  Appropriate  Thought Process:  Goal Directed and Descriptions of Associations: Intact  Orientation:  Full (Time, Place, and Person)  Thought Content:  Delusions, Hallucinations: Auditory and Paranoid Ideation  Suicidal Thoughts:  No  Homicidal Thoughts:  No  Memory:  Immediate;   Good Recent;   Fair Remote;   Fair  Judgement:  Impaired  Insight:  Shallow  Psychomotor Activity:  Normal  Concentration:  Concentration: Fair and Attention Span: Fair  Recall:  [employer] of Knowledge:  Fair  Language:  Fair  Akathisia:  No  Handed:  Right  AIMS (if indicated):     Assets:  Communication Skills Desire for Improvement Financial Resources/Insurance Housing Physical Health Resilience Social Support  ADL's:  Intact  Cognition:  WNL  Sleep:         COGNITIVE FEATURES THAT CONTRIBUTE TO RISK:  None    SUICIDE RISK:   Moderate:  Frequent suicidal ideation with limited intensity, and duration, some specificity in terms of plans, no associated intent, good self-control, limited dysphoria/symptomatology, some risk factors present, and identifiable protective factors, including available and accessible social support.  PLAN OF CARE: Hospital admission, medication management, substance abuse counseling, discharge planning.  Anthony Luna is a 31 year old male with a history of schizophrenia admitted for psychotic break in the context of partial treatment compliance and substance abuse.  # Psychosis, improving - Continue Prolixin 10 mg BID - Continue Amantadine 100 mg BID - Continue Lithium 450 mg BID, level 0.26 on admission - Continue Invega  Trinza injections in the community, last dose date unknown at this point  # Substance abuse - Positive for cannabis - Patient not motivated for change  # Insomnia - Continue  Trazodone 100 mg nightly  # Anxiety -we will not give benzodiazepines - Vistaril is available  # Smoking - Nicotine patch is available  # Metabolic syndrome monitoring - Lipid panel, TSH and HgbA1C are pending - EKG is pending  # Disposition -discharge back to his apartment - follow up with [facility]    I certify that inpatient services furnished can reasonably be expected to improve the patient's condition.   Anthony LineaJolanta Aimar Borghi, MD 04/08/2017, 9:56 AM

## 2017-04-08 NOTE — Plan of Care (Signed)
Problem: Education: Goal: Ability to state activities that reduce stress will improve Outcome: Progressing Attending unit programing   Problem: Coping: Goal: Ability to identify and develop effective coping behavior will improve Outcome: Progressing Working on Pharmacologistcoping skills
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
## 2017-04-08 NOTE — Progress Notes (Signed)
D: Patient stated slept good last night .Stated appetite is good and energy level  Is normal. Stated concentration is good . Stated on Depression scale 2 , hopeless 0 and anxiety 0 .( low 0-10 high) Denies suicidal  homicidal ideations  .  No auditory hallucinations  No pain concerns . Appropriate ADL'S. Interacting with peers and staff.Cognitive functioning low .  Noted to repeat what ever staff is saying . Noted fearful of peers on unit  Isolated to self , but remains in dayroom  A;ttending unit programing   A: Encourage patient participation with unit programming . Instruction  Given on  Medication , verbalize understanding. R: Voice no other concerns. Staff continue to monitor

## 2017-04-08 NOTE — Plan of Care (Signed)
Problem: Education: Goal: Ability to state activities that reduce stress will improve Attending unit programing

## 2017-04-08 NOTE — Tx Team (Signed)
Interdisciplinary Treatment and Diagnostic Plan Update  04/08/2017 Time of Session: 10:30am Anthony Luna MRN: [id number]  Principal Diagnosis: Schizoaffective disorder, bipolar type (HCC)  Secondary Diagnoses: Principal Problem:   Schizoaffective disorder, bipolar type (HCC) Active Problems:   Cannabis use disorder, severe, dependence (HCC)   Tobacco use disorder   Current Medications:  Current Facility-Administered Medications  Medication Dose Route Frequency Provider Last Rate Last Dose  . acetaminophen (TYLENOL) tablet 650 mg  650 mg Oral Q6H PRN [name], [name], MD   650 mg at 04/08/17 0115  . alum & mag hydroxide-simeth (MAALOX/MYLANTA) 200-200-20 MG/5ML suspension 30 mL  30 mL Oral Q4H PRN [name], [name], MD      . amantadine (SYMMETREL) capsule 100 mg  100 mg Oral BID [name], [name], MD   100 mg at 04/08/17 0847  . fluPHENAZine (PROLIXIN) tablet 10 mg  10 mg Oral BID [name], [name], MD   10 mg at 04/08/17 0847  . hydrOXYzine (ATARAX/VISTARIL) tablet 25 mg  25 mg Oral TID PRN [name], [name], MD      . lithium carbonate (ESKALITH) CR tablet 450 mg  450 mg Oral Q12H [name], [name], MD   450 mg at 04/08/17 0847  . magnesium hydroxide (MILK OF MAGNESIA) suspension 30 mL  30 mL Oral Daily PRN [name], [name], MD      . traZODone (DESYREL) tablet 100 mg  100 mg Oral QHS PRN [name], [name], MD       PTA Medications: Prescriptions Prior to Admission  Medication Sig Dispense Refill Last Dose  . amantadine (SYMMETREL) 100 MG capsule Take 1 capsule (100 mg total) by mouth 2 (two) times daily. 60 capsule 0   . clonazePAM (KLONOPIN) 0.5 MG tablet Take 1 tablet (0.5 mg total) by mouth at bedtime. 30 tablet 0   . fluPHENAZine (PROLIXIN) 10 MG tablet Take 1 tablet (10 mg total) by mouth 2 (two) times daily before a meal. 60 tablet 0   . fluPHENAZine decanoate (PROLIXIN) 25 MG/ML injection Inject 2 mLs (50 mg total) into the muscle every 14 (fourteen) days. 5 mL 0   .  hydrOXYzine (ATARAX/VISTARIL) 25 MG tablet Take 1 tablet (25 mg total) by mouth 3 (three) times daily as needed for itching. 15 tablet 0   . lithium carbonate (ESKALITH) 450 MG CR tablet Take 1 tablet (450 mg total) by mouth every 12 (twelve) hours. 60 tablet 0     Patient Stressors: Financial difficulties Marital or family conflict Substance abuse  Patient Strengths: Motivation for treatment/growth Supportive family/friends  Treatment Modalities: Medication Management, Group therapy, Case management,  1 to 1 session with clinician, Psychoeducation, Recreational therapy.   Physician Treatment Plan for Primary Diagnosis: Schizoaffective disorder, bipolar type (HCC) Long Term Goal(s): Improvement in symptoms so as ready for discharge Improvement in symptoms so as ready for discharge   Short Term Goals: Ability to identify changes in lifestyle to reduce recurrence of condition will improve Ability to verbalize feelings will improve Ability to disclose and discuss suicidal ideas Ability to demonstrate self-control will improve Ability to identify and develop effective coping behaviors will improve Compliance with prescribed medications will improve Ability to identify triggers associated with substance abuse/mental health issues will improve Ability to identify changes in lifestyle to reduce recurrence of condition will improve Ability to demonstrate self-control will improve Ability to identify triggers associated with substance abuse/mental health issues will improve  Medication Management: Evaluate patient's response, side effects, and tolerance of medication  regimen.  Therapeutic Interventions: 1 to 1 sessions, Unit Group sessions and Medication administration.  Evaluation of Outcomes: Progressing  Physician Treatment Plan for Secondary Diagnosis: Principal Problem:   Schizoaffective disorder, bipolar type (HCC) Active Problems:   Cannabis use disorder, severe, dependence  (HCC)   Tobacco use disorder  Long Term Goal(s): Improvement in symptoms so as ready for discharge Improvement in symptoms so as ready for discharge   Short Term Goals: Ability to identify changes in lifestyle to reduce recurrence of condition will improve Ability to verbalize feelings will improve Ability to disclose and discuss suicidal ideas Ability to demonstrate self-control will improve Ability to identify and develop effective coping behaviors will improve Compliance with prescribed medications will improve Ability to identify triggers associated with substance abuse/mental health issues will improve Ability to identify changes in lifestyle to reduce recurrence of condition will improve Ability to demonstrate self-control will improve Ability to identify triggers associated with substance abuse/mental health issues will improve     Medication Management: Evaluate patient's response, side effects, and tolerance of medication regimen.  Therapeutic Interventions: 1 to 1 sessions, Unit Group sessions and Medication administration.  Evaluation of Outcomes: Progressing   RN Treatment Plan for Primary Diagnosis: Schizoaffective disorder, bipolar type (HCC) Long Term Goal(s): Knowledge of disease and therapeutic regimen to maintain health will improve  Short Term Goals: Ability to participate in decision making will improve, Ability to identify and develop effective coping behaviors will improve and Compliance with prescribed medications will improve  Medication Management: RN will administer medications as ordered by provider, will assess and evaluate patient's response and provide education to patient for prescribed medication. RN will report any adverse and/or side effects to prescribing provider.  Therapeutic Interventions: 1 on 1 counseling sessions, Psychoeducation, Medication administration, Evaluate responses to treatment, Monitor vital signs and CBGs as ordered, Perform/monitor  CIWA, COWS, AIMS and Fall Risk screenings as ordered, Perform wound care treatments as ordered.  Evaluation of Outcomes: Progressing   LCSW Treatment Plan for Primary Diagnosis: Schizoaffective disorder, bipolar type (HCC) Long Term Goal(s): Safe transition to appropriate next level of care at discharge, Engage patient in therapeutic group addressing interpersonal concerns.  Short Term Goals: Engage patient in aftercare planning with referrals and resources, Facilitate acceptance of mental health diagnosis and concerns, Identify triggers associated with mental health/substance abuse issues and Increase skills for wellness and recovery  Therapeutic Interventions: Assess for all discharge needs, 1 to 1 time with Social worker, Explore available resources and support systems, Assess for adequacy in community support network, Educate family and significant other(s) on suicide prevention, Complete Psychosocial Assessment, Interpersonal group therapy.  Evaluation of Outcomes: Progressing   Progress in Treatment: Attending groups: Yes. Participating in groups: Yes. Taking medication as prescribed: Yes. Toleration medication: Yes. Family/Significant other contact made: No, will contact:    Patient understands diagnosis: Yes. Discussing patient identified problems/goals with staff: Yes. Medical problems stabilized or resolved: Yes. Denies suicidal/homicidal ideation: Yes. Issues/concerns per patient self-inventory: Yes. Other:    New problem(s) identified: No, Describe:     New Short Term/Long Term Goal(s): to feel better  Discharge Plan or Barriers: Return to apartment and follow up with [facility].  Reason for Continuation of Hospitalization: Hallucinations Medication stabilization  Estimated Length of Stay: 7 days  Attendees: Patient:Anthony Luna 04/08/2017 2:46 PM  Physician:Jolanta Pucilowska 04/08/2017 2:46 PM  Nursing: Hulan AmatoGwen Farrish, RN 04/08/2017 2:46 PM  RN Care  Manager: 04/08/2017 2:46 PM  Social Worker: Jake SharkSara Ameshia Pewitt, LCSW 04/08/2017 2:46 PM  Recreational Therapist:  04/08/2017 2:46 PM  Other:  04/08/2017 2:46 PM  Other:  04/08/2017 2:46 PM  Other: 04/08/2017 2:46 PM    Scribe for Treatment Team: Glennon Mac, LCSW 04/08/2017 2:46 PM

## 2017-04-09 MED ORDER — AMOXICILLIN 500 MG PO CAPS
500.0000 mg | ORAL_CAPSULE | Freq: Three times a day (TID) | ORAL | Status: DC
  Administered 2017-04-09 – 2017-04-13 (×11): 500 mg via ORAL
  Filled 2017-04-09 (×14): qty 1

## 2017-04-09 MED ORDER — BACITRACIN-NEOMYCIN-POLYMYXIN 400-5-5000 EX OINT
TOPICAL_OINTMENT | Freq: Two times a day (BID) | CUTANEOUS | Status: DC
  Administered 2017-04-09 – 2017-04-12 (×5): 1 via TOPICAL
  Administered 2017-04-12: 10:00:00 via TOPICAL
  Administered 2017-04-13: 1 via TOPICAL
  Filled 2017-04-09 (×8): qty 1

## 2017-04-09 NOTE — [facility] Group Notes (Signed)
[facility] Group Notes:  (Nursing/MHT/Case Management/Adjunct)  Date:  04/09/2017  Time:  11:16 AM  Type of Therapy:  Psychoeducational Skills  Participation Level:  Active  Participation Quality:  Appropriate and Sharing  Affect:  Appropriate and Excited  Cognitive:  Appropriate  Insight:  Appropriate  Engagement in Group:  Engaged and Supportive  Modes of Intervention:  Discussion and Education  Summary of Progress/Problems:  Anthony Luna M Anthony Luna 04/09/2017, 11:16 AM

## 2017-04-09 NOTE — Progress Notes (Signed)
[facility]  04/09/2017 12:59 PM Anthony Luna  MRN:  [id number]  Subjective:   Anthony Luna is out in the community this morning.  He reports improvement but is unable to be more specific.  She complains of interrupted sleep last night.  She also reports loud auditory hallucinations all night long.  He still believes that her community support provider was trying to kill him prior to admission.  I could see him in the day room today playing with a male patient, advancing, and laughing.  We expect his community support team to visit today.  Treatment plan.  We will continue him on Prolixin 10 mg twice daily for psychosis, 100 mg twice daily for EPS prevention, and lithium 450 mg twice daily for mood stabilization.  The patient reportedly receives injections of Invega trinza.   Social/disposition.  The patient lives independently.  He will return to his apartment.  He will follow-up with his primary psychiatrist and his support team.  Principal Problem: Schizoaffective disorder, bipolar type (HCC) Diagnosis:   Patient Active Problem List   Diagnosis Date Noted  . Cannabis use disorder, severe, dependence (HCC) [F12.20] 11/30/2014  . Schizoaffective disorder, bipolar type (HCC) [F25.0] 11/30/2014  . Tobacco use disorder [F17.200] 11/30/2014   Total Time spent with patient: 20 minutes  Past Psychiatric History: The patient carries diagnosis of schizophrenia.  He has been hospitalized several times before [facility]  He is able to name his current medications but does not remember all the medicines except for Prolixin injections.  He denies ever attempting suicide.  Past Medical History:  Past Medical History:  Diagnosis Date  . None to low serum cortisol response with adrenocorticotrophic hormone (ACTH) stimulation test   . Schizophrenia, paranoid type (HCC)    History reviewed. No pertinent surgical  history. Family History: History reviewed. No pertinent family history. Family Psychiatric  History: Aunt with schizophrenia. Social History:  History  Alcohol Use No     History  Drug Use  . Types: Marijuana    Social History   Social History  . Marital status: Single    Spouse name: N/A  . Number of children: N/A  . Years of education: N/A   Social History Main Topics  . Smoking status: Current Every Day Smoker    Packs/day: 0.50    Types: Cigarettes    Start date: 11/29/2002  . Smokeless tobacco: Current User  . Alcohol use No  . Drug use: Yes    Types: Marijuana  . Sexual activity: Not Asked   Other Topics Concern  . None   Social History Narrative  . None   Additional Social History:    Pain Medications: Pt denies abuse Prescriptions: Pt denies abuse Over the Counter: Pt denies abuse History of alcohol / drug use?: Yes Longest period of sobriety (when/how long): Not reported Name of Substance 1: THC 1 - Age of First Use: 16 1 - Amount (size/oz): 2 blunts per week 1 - Frequency: 2-3x per week 1 - Duration: Ongoing 1 - Last Use / Amount: Today                  Sleep: Poor  Appetite:  Fair  Current Medications: Current Facility-Administered Medications  Medication Dose Route Frequency Provider Last Rate Last Dose  . acetaminophen (TYLENOL) tablet 650 mg  650 mg Oral Q6H PRN [name], [name], MD   650 mg at 04/08/17 0115  . alum &  mag hydroxide-simeth (MAALOX/MYLANTA) 200-200-20 MG/5ML suspension 30 mL  30 mL Oral Q4H PRN [name], [name], MD      . amantadine (SYMMETREL) capsule 100 mg  100 mg Oral BID [name], [name], MD   100 mg at 04/09/17 0941  . fluPHENAZine (PROLIXIN) tablet 10 mg  10 mg Oral BID [name], [name], MD   10 mg at 04/09/17 0941  . hydrOXYzine (ATARAX/VISTARIL) tablet 25 mg  25 mg Oral TID PRN [name], [name], MD      . lithium carbonate (ESKALITH) CR tablet 450 mg  450 mg Oral Q12H [name], [name], MD   450 mg at 04/09/17 0940   . magnesium hydroxide (MILK OF MAGNESIA) suspension 30 mL  30 mL Oral Daily PRN [name], [name], MD      . traZODone (DESYREL) tablet 100 mg  100 mg Oral QHS PRN [name], [name], MD        Lab Results:  Results for orders placed or performed during the hospital encounter of 04/07/17 (from the past 48 hour(s))  Lithium level     Status: Abnormal   Collection Time: 04/07/17  4:10 PM  Result Value Ref Range   Lithium Lvl 0.26 (L) 0.60 - 1.20 mmol/L  Hemoglobin A1c     Status: None   Collection Time: 04/08/17  6:58 AM  Result Value Ref Range   Hgb A1c MFr Bld 5.3 4.8 - 5.6 %    Comment: (NOTE) Pre diabetes:          5.7%-6.4% Diabetes:              >6.4% Glycemic control for   <7.0% adults with diabetes    Mean Plasma Glucose 105.41 mg/dL    Comment: Performed at [facility], [address] [place], [place] [address]  Lipid panel     Status: None   Collection Time: 04/08/17  6:58 AM  Result Value Ref Range   Cholesterol 158 0 - 200 mg/dL   Triglycerides 45 <604 mg/dL   HDL 54 >54 mg/dL   Total CHOL/HDL Ratio 2.9 RATIO   VLDL 9 0 - 40 mg/dL   LDL Cholesterol 95 0 - 99 mg/dL    Comment:        Total Cholesterol/HDL:CHD Risk Coronary Heart Disease Risk Table                     Men   Women  1/2 Average Risk   3.4   3.3  Average Risk       5.0   4.4  2 X Average Risk   9.6   7.1  3 X Average Risk  23.4   11.0        Use the calculated Patient Ratio above and the CHD Risk Table to determine the patient's CHD Risk.        ATP III CLASSIFICATION (LDL):  <100     mg/dL   Optimal  098-119  mg/dL   Near or Above                    Optimal  130-159  mg/dL   Borderline  147-829  mg/dL   High  >562     mg/dL   Very High   TSH     Status: None   Collection Time: 04/08/17  6:58 AM  Result Value Ref Range   TSH 2.145 0.350 - 4.500 uIU/mL    Comment:  Performed by a 3rd Generation assay with a functional sensitivity of <=0.01 uIU/mL.    Blood Alcohol level:  Lab  Results  Component Value Date   ETH <10 04/07/2017   ETH <5 12/06/2016    Metabolic Disorder Labs: Lab Results  Component Value Date   HGBA1C 5.3 04/08/2017   MPG 105.41 04/08/2017   Lab Results  Component Value Date   PROLACTIN 6.1 12/19/2014   PROLACTIN 1.3 (L) 11/30/2014   Lab Results  Component Value Date   CHOL 158 04/08/2017   TRIG 45 04/08/2017   HDL 54 04/08/2017   CHOLHDL 2.9 04/08/2017   VLDL 9 04/08/2017   LDLCALC 95 04/08/2017   LDLCALC 78 12/19/2014    Physical Findings: AIMS: Facial and Oral Movements Muscles of Facial Expression: None, normal Lips and Perioral Area: None, normal Jaw: None, normal Tongue: None, normal,Extremity Movements Upper (arms, wrists, hands, fingers): None, normal Lower (legs, knees, ankles, toes): None, normal, Trunk Movements Neck, shoulders, hips: None, normal, Overall Severity Severity of abnormal movements (highest score from questions above): None, normal Incapacitation due to abnormal movements: None, normal Patient's awareness of abnormal movements (rate only patient's report): No Awareness, Dental Status Current problems with teeth and/or dentures?: Yes (wisdom tooth L) bottom area (coming in per pt)) Does patient usually wear dentures?: No  CIWA:    COWS:  COWS Total Score: 0  Musculoskeletal: Strength & Muscle Tone: within normal limits Gait & Station: normal Patient leans: N/A  Psychiatric Specialty Exam: Physical Exam  Nursing note and vitals reviewed. Psychiatric: His speech is normal. Judgment normal. His mood appears anxious. He is actively hallucinating. Thought content is paranoid and delusional. Cognition and memory are normal.    Review of Systems  Neurological: Negative.   Psychiatric/Behavioral: Positive for hallucinations.  All other systems reviewed and are negative.   Blood pressure 129/74, pulse 80, temperature 98.4 F (36.9 C), temperature source Oral, resp. rate 18, height 6\' 1"  (1.854 m),  weight 85.3 kg (188 lb), SpO2 100 %.Body mass index is 24.8 kg/m.  General Appearance: Casual  Eye Contact:  Good  Speech:  Clear and Coherent  Volume:  Normal  Mood:  Anxious  Affect:  Inappropriate  Thought Process:  Disorganized and Descriptions of Associations: Tangential  Orientation:  Full (Time, Place, and Person)  Thought Content:  Delusions, Hallucinations: Auditory and Paranoid Ideation  Suicidal Thoughts:  No  Homicidal Thoughts:  No  Memory:  Immediate;   Fair Recent;   Fair Remote;   Fair  Judgement:  Poor  Insight:  Shallow  Psychomotor Activity:  Increased  Concentration:  Concentration: Fair and Attention Span: Fair  Recall:  [employer] of Knowledge:  Fair  Language:  Fair  Akathisia:  No  Handed:  Right  AIMS (if indicated):     Assets:  Communication Skills Desire for Improvement Financial Resources/Insurance Housing Physical Health Resilience Social Support  ADL's:  Intact  Cognition:  WNL  Sleep:  Number of Hours: 6.75     Treatment Plan Summary: Daily contact with patient to assess and evaluate symptoms and progress in treatment and Medication management   Mr. Sainz is a 31 year old male with a history of schizophrenia admitted for psychotic break in the context of partial treatment compliance and substance abuse.  # Psychosis, improving - Continue Prolixin 10 mg BID - Continue Amantadine 100 mg BID - Continue Lithium 450 mg BID, level 0.26 on admission - Continue Invega Trinza injections in the community, last dose date  unknown at this point  # Substance abuse - Positive for cannabis - Patient not motivated for change  # Insomnia - Discontinue Trazodone - start Restoril 15 mg nightly  # Anxiety -we will not give benzodiazepines - Vistaril is available  # Smoking - Nicotine patch is available  # Metabolic syndrome monitoring -Lipid panel, TSH, hemoglobin A1c  -EKG, QTC 394  # Disposition -discharge back to his  apartment -Follow-up with elements are The need for medication management and community support  Kristine Linea, MD 04/09/2017, 12:59 PM

## 2017-04-09 NOTE — Plan of Care (Signed)
Problem: Spiritual Needs Goal: Ability to function at adequate level Outcome: Progressing Anthony Luna is OOB in the milieu, socializing with select peers.  Problem: Self-Concept: Goal: Ability to identify factors that promote anxiety will improve Outcome: Progressing Anthony Luna stated his anxiety is less related to his admission date.

## 2017-04-09 NOTE — Progress Notes (Signed)
Pamala Hurry and Merry Proud from [employer] peer support met with client.  They are planning to pick pt up when he discharges to resume services. Winferd Humphrey, MSW, LCSW Clinical Social Worker 04/09/2017 3:35 PM

## 2017-04-09 NOTE — [facility] Group Notes (Signed)
[facility] Group Notes:  (Nursing/MHT/Case Management/Adjunct)  Date:  04/09/2017  Time:  2:04 PM  Type of Therapy:  Psychoeducational Skills  Participation Level:  Did Not Attend  Lynelle SmokeCara Travis [facility] Hospital[facility] 04/09/2017, 2:04 PM

## 2017-04-09 NOTE — Progress Notes (Signed)
Received Anthony Luna this am in group therapy, He was compliant with his medications. He c/o having a toothache and maybe an abscess. He requested cream for his pimples. He remained OOB in the milieu  And the above health deviations were treated.

## 2017-04-09 NOTE — [facility] Group Notes (Signed)
[facility] LCSW Group Therapy Note  Date/Time: 04/09/17, 0930  Type of Therapy/Topic:  Group Therapy:  Balance in Life  Participation Level:  Did not attend  Description of Group:    This group will address the concept of balance and how it feels and looks when one is unbalanced. Patients will be encouraged to process areas in their lives that are out of balance, and identify reasons for remaining unbalanced. Facilitators will guide patients utilizing problem- solving interventions to address and correct the stressor making their life unbalanced. Understanding and applying boundaries will be explored and addressed for obtaining  and maintaining a balanced life. Patients will be encouraged to explore ways to assertively make their unbalanced needs known to significant others in their lives, using other group members and facilitator for support and feedback.  Therapeutic Goals: 1. Patient will identify two or more emotions or situations they have that consume much of in their lives. 2. Patient will identify signs/triggers that life has become out of balance:  3. Patient will identify two ways to set boundaries in order to achieve balance in their lives:  4. Patient will demonstrate ability to communicate their needs through discussion and/or role plays  Summary of Patient Progress:          Therapeutic Modalities:   Cognitive Behavioral Therapy Solution-Focused Therapy Assertiveness Training  Greg Corinthian Kemler, LCSW 

## 2017-04-10 MED ORDER — TEMAZEPAM 15 MG PO CAPS
15.0000 mg | ORAL_CAPSULE | Freq: Every day | ORAL | Status: DC
Start: 2017-04-10 — End: 2017-04-13
  Administered 2017-04-10 – 2017-04-12 (×3): 15 mg via ORAL
  Filled 2017-04-10 (×3): qty 1

## 2017-04-10 NOTE — Progress Notes (Signed)
D: Pt denies SI/HI/AVH. Pt is pleasant and cooperative. Pt stated he was doing better due to being free of the drugs and being on his medications.   A: Pt was offered support and encouragement. Pt was given scheduled medications. Pt was encourage to attend groups. Q 15 minute checks were done for safety.   R: Pt is taking medication. Pt has no complaints.Pt receptive to treatment and safety maintained on unit.

## 2017-04-10 NOTE — Plan of Care (Signed)
Problem: Spiritual Needs Goal: Ability to function at adequate level Outcome: Not Progressing Anthony Luna continues to hear voices.  Problem: Self-Concept: Goal: Ability to identify factors that promote anxiety will improve Outcome: Not Progressing Anthony Luna continues to endorse feeling anxious.

## 2017-04-10 NOTE — Progress Notes (Signed)
Received Anthony Luna this am after breakfast, he was compliant with his medications. He verbalized feeling anxious and auditory hallucinations (just talking). He requested this Clinical research associatewriter pray for him the voices will stop. He remains OOB in the milieu at intervals and attending the group therapy sessions. Earlier, he interacted with the student nurses and shared his past experience of [facility] and his past legal issues.  Later he verbalized struggling with lack of concentration, at the time he was in the [employer] working on a project.

## 2017-04-10 NOTE — [facility] Group Notes (Signed)
[facility] LCSW Group Therapy Note  Date/Time: 04/10/17, 0930  Type of Therapy and Topic:  Group Therapy:  Feelings around Relapse and Recovery  Participation Level:  Minimal   Mood:pleasant  Description of Group:    Patients in this group will discuss emotions they experience before and after a relapse. They will process how experiencing these feelings, or avoidance of experiencing them, relates to having a relapse. Facilitator will guide patients to explore emotions they have related to recovery. Patients will be encouraged to process which emotions are more powerful. They will be guided to discuss the emotional reaction significant others in their lives may have to patients' relapse or recovery. Patients will be assisted in exploring ways to respond to the emotions of others without this contributing to a relapse.  Therapeutic Goals: 1. Patient will identify two or more emotions that lead to relapse for them:  2. Patient will identify two emotions that result when they relapse:  3. Patient will identify two emotions related to recovery:  4. Patient will demonstrate ability to communicate their needs through discussion and/or role plays.   Summary of Patient Progress: Pt identified anxiety as difficult emotion to manage and participated somewhat in group discussion about the problems with managing difficult emotions by using substances.     Therapeutic Modalities:   Cognitive Behavioral Therapy Solution-Focused Therapy Assertiveness Training Relapse Prevention Therapy  Daleen SquibbGreg Marelly Wehrman, LCSW

## 2017-04-10 NOTE — Plan of Care (Signed)
Problem: Self-Concept: Goal: Level of anxiety will decrease Outcome: Progressing Pt stated he was feeling a little better this evening

## 2017-04-10 NOTE — Progress Notes (Signed)
Recreation Therapy Notes   Date: 10.26.18  Time: 1:00pm  Location: Craft Room  Behavioral response: Active, Engaged, Attentive, Appropriate  Topic: Goals  Intervention: Group content on today was focused on goals. Patients described what goals are and how they define goals. Individuals expressed how they go about setting goals and reaching them. The group identified how important goals are and if they make short term goals to reach long term goals. Patients described how many goals they work on at a time and what affects them not reaching their goal. Individuals described how much time they put into planning and obtaining their goals. The group participated in the intervention "My Goal Board" and made personal goal boards to help them achieve their goal.  Clinical Observations/Feedback:  Patient came to group late for unknown reasons and participated in the intervention. Individual was positive with his peers and staff during group. Patient was pulled from group by Nurse and was not able to return.        Anthony Luna 04/10/2017 2:53 PM

## 2017-04-10 NOTE — Progress Notes (Signed)
Pt woke up 0230 stated he could not sleep, pt sleep medications may need to be reevaluated. Pt was given 50mg  trazodone, 25 mg vistaril last night.

## 2017-04-10 NOTE — Progress Notes (Addendum)
[facility] MD Progress Note  04/10/2017 12:35 PM Anthony Luna  MRN:  [id number]  Subjective:   Anthony Luna is a 31 year old male with a history of schizophrenia admitted for psychotic break in the context of noncompliance.  He was restarted on medications, still experiences hallucinations.  Anthony Luna is cool and collected this morning.  He still complains of auditory hallucinations but they are getting quieter.  There are no commands.  His mood is good.  He is out in the community and is with peers and staff appropriately.  He met with his community support team yesterday and apparently he is close to his baseline.  He tolerates medications well.  Treatment plan.  We will continue Prolixin 10 mg twice daily for psychosis, 100 mg twice daily for EPS prevention as he has a slight shake in the left hand, and lithium 450 mg twice daily for mood stabilization.  The patient is also on [place] in the community.  Social/disposition.  The patient lives in an independent apartment.  He will follow up with [place].  Principal Problem: Schizoaffective disorder, bipolar type ([place]) Diagnosis:   Patient Active Problem List   Diagnosis Date Noted  . Cannabis use disorder, severe, dependence ([place]) [F12.20] 11/30/2014  . Schizoaffective disorder, bipolar type ([place]) [F25.0] 11/30/2014  . Tobacco use disorder [F17.200] 11/30/2014   Total Time spent with patient: 20 minutes  Past Psychiatric History: Schizophrenia.  Past Medical History:  Past Medical History:  Diagnosis Date  . None to low serum cortisol response with adrenocorticotrophic hormone (ACTH) stimulation test   . Schizophrenia, paranoid type ([place])    History reviewed. No pertinent surgical history. Family History: History reviewed. No pertinent family history. Family Psychiatric  History: Schizophrenia. Social History:  History  Alcohol Use No     History  Drug Use  . Types: Marijuana    Social History   Social  History  . Marital status: Single    Spouse name: N/A  . Number of children: N/A  . Years of education: N/A   Social History Main Topics  . Smoking status: Current Every Day Smoker    Packs/day: 0.50    Types: Cigarettes    Start date: 11/29/2002  . Smokeless tobacco: Current User  . Alcohol use No  . Drug use: Yes    Types: Marijuana  . Sexual activity: Not Asked   Other Topics Concern  . None   Social History Narrative  . None   Additional Social History:    Pain Medications: Pt denies abuse Prescriptions: Pt denies abuse Over the Counter: Pt denies abuse History of alcohol / drug use?: Yes Longest period of sobriety (when/how long): Not reported Name of Substance 1: THC 1 - Age of First Use: 16 1 - Amount (size/oz): 2 blunts per week 1 - Frequency: 2-3x per week 1 - Duration: Ongoing 1 - Last Use / Amount: Today                  Sleep: Fair  Appetite:  Fair  Current Medications: Current Facility-Administered Medications  Medication Dose Route Frequency Provider Last Rate Last Dose  . acetaminophen (TYLENOL) tablet 650 mg  650 mg Oral Q6H PRN [name], [name], MD   650 mg at 04/08/17 0115  . alum & mag hydroxide-simeth (MAALOX/MYLANTA) 200-200-20 MG/5ML suspension 30 mL  30 mL Oral Q4H PRN [name], [name], MD      . amantadine (SYMMETREL) capsule 100 mg  100 mg Oral BID  [name], [name], MD   100 mg at 04/10/17 1610  . amoxicillin (AMOXIL) capsule 500 mg  500 mg Oral Q8H Abisai Deer B, MD   500 mg at 04/10/17 0630  . fluPHENAZine (PROLIXIN) tablet 10 mg  10 mg Oral BID [name], [name], MD   10 mg at 04/10/17 9604  . hydrOXYzine (ATARAX/VISTARIL) tablet 25 mg  25 mg Oral TID PRN [name], [name], MD   25 mg at 04/09/17 2223  . lithium carbonate (ESKALITH) CR tablet 450 mg  450 mg Oral Q12H [name], [name], MD   450 mg at 04/10/17 0927  . magnesium hydroxide (MILK OF MAGNESIA) suspension 30 mL  30 mL Oral Daily PRN [name], [name], MD      .  neomycin-bacitracin-polymyxin (NEOSPORIN) ointment   Topical BID Ramirez Fullbright B, MD   1 application at 54/09/81 0926  . traZODone (DESYREL) tablet 100 mg  100 mg Oral QHS PRN [name], [name], MD   100 mg at 04/09/17 2223    Lab Results: No results found for this or any previous visit (from the past 48 hour(s)).  Blood Alcohol level:  Lab Results  Component Value Date   ETH <10 04/07/2017   ETH <5 19/14/7829    Metabolic Disorder Labs: Lab Results  Component Value Date   HGBA1C 5.3 04/08/2017   MPG 105.41 04/08/2017   Lab Results  Component Value Date   PROLACTIN 6.1 12/19/2014   PROLACTIN 1.3 (L) 11/30/2014   Lab Results  Component Value Date   CHOL 158 04/08/2017   TRIG 45 04/08/2017   HDL 54 04/08/2017   CHOLHDL 2.9 04/08/2017   VLDL 9 04/08/2017   LDLCALC 95 04/08/2017   LDLCALC 78 12/19/2014    Physical Findings: AIMS: Facial and Oral Movements Muscles of Facial Expression: None, normal Lips and Perioral Area: None, normal Jaw: None, normal Tongue: None, normal,Extremity Movements Upper (arms, wrists, hands, fingers): None, normal Lower (legs, knees, ankles, toes): None, normal, Trunk Movements Neck, shoulders, hips: None, normal, Overall Severity Severity of abnormal movements (highest score from questions above): None, normal Incapacitation due to abnormal movements: None, normal Patient's awareness of abnormal movements (rate only patient's report): No Awareness, Dental Status Current problems with teeth and/or dentures?: Yes (wisdom tooth L) bottom area (coming in per pt)) Does patient usually wear dentures?: No  CIWA:    COWS:  COWS Total Score: 0  Musculoskeletal: Strength & Muscle Tone: within normal limits Gait & Station: normal Patient leans: N/A  Psychiatric Specialty Exam: Physical Exam  Nursing note and vitals reviewed. Psychiatric: He has a normal mood and affect. His speech is normal. Judgment and thought content normal. He is  actively hallucinating. Cognition and memory are normal.    Review of Systems  Neurological: Negative.   Psychiatric/Behavioral: Positive for hallucinations. The patient has insomnia.   All other systems reviewed and are negative.   Blood pressure (!) 142/79, pulse 80, temperature 98.4 F (36.9 C), temperature source Oral, resp. rate 18, height _0  (1.854 m), weight 85.3 kg (188 lb), SpO2 100 %.Body mass index is 24.8 kg/m.  General Appearance: Casual  Eye Contact:  Good  Speech:  Clear and Coherent  Volume:  Normal  Mood:  Euthymic  Affect:  Appropriate  Thought Process:  Goal Directed and Descriptions of Associations: Intact  Orientation:  Full (Time, Place, and Person)  Thought Content:  Hallucinations: Auditory  Suicidal Thoughts:  No  Homicidal Thoughts:  No  Memory:  Immediate;   Fair Recent;   Fair Remote;   Fair  Judgement:  Poor  Insight:  Shallow  Psychomotor Activity:  Normal  Concentration:  Concentration: Fair and Attention Span: Fair  Recall:  [employer] of Knowledge:  Fair  Language:  Fair  Akathisia:  No  Handed:  Right  AIMS (if indicated):     Assets:  Communication Skills Desire for Improvement Financial Resources/Insurance Housing Physical Health Resilience Social Support  ADL's:  Intact  Cognition:  WNL  Sleep:  Number of Hours: 4.45     Treatment Plan Summary: Daily contact with patient to assess and evaluate symptoms and progress in treatment and Medication management   Anthony Luna is a 31 year old male with a history of schizophrenia admitted for psychotic break in the context of partial treatment compliance and substance abuse.  # Psychosis, improving - Continue Prolixin 10 mg BID - Continue Amantadine 100 mg BID - Continue Lithium 450 mg BID, level 0.26 on admission, level in am - Continue Invega Trinza injections in the community, last dose date unknown at this point  # Substance abuse - Positive for cannabis - Patient not  motivated for change  # Insomnia - did not respond to Trazodone  - Restoril 15 mg nightly    # Anxiety -we will not give benzodiazepines - Vistaril is available  # Smoking - Nicotine patch is available  # Metabolic syndrome monitoring -Lipid panel, TSH, hemoglobin A1c  -EKG, QTC 394  # Disposition -discharge back to his apartment -Follow-up with [place]    Orson Slick, MD 04/10/2017, 12:35 PM

## 2017-04-10 NOTE — Progress Notes (Signed)
Pleasant on contact, endorses AH stating it is less frequent. Was med compliant, some spontaneous smiles. Affect is generally blunted. Remains on routine obs for safety. No physical complaints..Marland Kitchen

## 2017-04-10 NOTE — Progress Notes (Signed)
CSW spoke with Anthony Luna, Care coordinator at [facility], [phone].  She visited pt yesterday and felt like he was better but noticed he was still sort of staring off mid conversation several times, which she said did not appear to be his normal behavior in her experience with pt.  She said he would bite his lip during these times as well.  His conversation seemed back to normal.  She asked about potential discharge date and any needs post discharge that the treatment team could address. Anthony Luna, MSW, LCSW Clinical Social Worker 04/10/2017 2:57 PM

## 2017-04-10 NOTE — [facility] Group Notes (Signed)
[facility] Group Notes:  (Nursing/MHT/Case Management/Adjunct)  Date:  04/10/2017  Time:  5:46 AM  Type of Therapy:  Psychoeducational Skills  Participation Level:  Did Not Attend   Summary of Progress/Problems:  Anthony Luna 04/10/2017, 5:46 AM

## 2017-04-11 LAB — LITHIUM LEVEL: LITHIUM LVL: 0.56 mmol/L — AB (ref 0.60–1.20)

## 2017-04-11 MED ORDER — NICOTINE 21 MG/24HR TD PT24
21.0000 mg | MEDICATED_PATCH | Freq: Every day | TRANSDERMAL | Status: DC
  Administered 2017-04-11 – 2017-04-13 (×3): 21 mg via TRANSDERMAL
  Filled 2017-04-11 (×2): qty 1

## 2017-04-11 MED ORDER — LITHIUM CARBONATE ER 300 MG PO TBCR
600.0000 mg | EXTENDED_RELEASE_TABLET | Freq: Two times a day (BID) | ORAL | Status: DC
  Administered 2017-04-11 – 2017-04-13 (×4): 600 mg via ORAL
  Filled 2017-04-11 (×4): qty 2

## 2017-04-11 NOTE — Progress Notes (Signed)
Blank and staring ahead momentarily. Then began smiling and talking to this Clinical research associatewriter. Denies AH at this time. Mood and affect are bright. Visible in the milieu, was med compliant. Has no physical complaints.

## 2017-04-11 NOTE — [facility] Group Notes (Signed)
[facility] Group Notes:  (Nursing/MHT/Case Management/Adjunct)  Date:  04/11/2017  Time:  9:21 PM  Type of Therapy:  Wrap up Grp  Participation Level:  Active  Participation Quality:  Appropriate  Affect:  Appropriate  Cognitive:  Alert  Insight:  Appropriate  Engagement in Group:  Engaged  Modes of Intervention:  Support  Summary of Progress/Problems:  Anthony Luna 04/11/2017, 9:21 PM

## 2017-04-11 NOTE — [facility] Group Notes (Signed)
LCSW Group Therapy Note  04/11/2017 1:00pm  Type of Therapy/Topic:  Group Therapy:  Balance in Life  Participation Level:  Minimal  Description of Group:   This group will address the concept of balance and how it feels and looks when one is unbalanced. Patients will be encouraged to process areas in their lives that are out of balance and identify reasons for remaining unbalanced. Facilitators will guide patients in utilizing problem-solving interventions to address and correct the stressor making their life unbalanced. Understanding and applying boundaries will be explored and addressed for obtaining and maintaining a balanced life. Patients will be encouraged to explore ways to assertively make their unbalanced needs known to significant others in their lives, using other group members and facilitator for support and feedback.  Therapeutic Goals: 1. Patient will identify two or more emotions or situations they have that consume much of in their lives. 2. Patient will identify signs/triggers that life has become out of balance:  3. Patient will identify two ways to set boundaries in order to achieve balance in their lives:  4. Patient will demonstrate ability to communicate their needs through discussion and/or role plays  Summary of Patient Progress:  Pt attended group and stayed the entire time. Pt sat quietly and listened to other group members share.      Therapeutic Modalities:   Cognitive Behavioral Therapy Solution-Focused Therapy Assertiveness Training  Sherald Balbuena L Adalyne Lovick, LCSW 04/11/2017 2:14 PM  

## 2017-04-11 NOTE — Progress Notes (Signed)
Data: Patient is alert,oriented reports auditory hallucinations, and has denied SI at this time. Patient's mood is apathetic and patient presents with a blank affect. Patient has no physical complaints at this time. Patient reports good sleep for 7h3351m, appetite is good. Patient rates depression 4/10 , Feelings of hopelessness 6/10 and Anxiety 1/10 Patients goal for today is to continue taking medications.  Action:  Patient is observed q15, and is on moderate fall precautions.  Patient has been educated on medications. Patient encouraged to attend groups and participate with unit activites. Patient will continue with plan of care.    Response: Patient is medication compliant. Patient receptive to treatment and safety maintained on unit.

## 2017-04-11 NOTE — Progress Notes (Signed)
[facility] MD Progress Note  04/11/2017 1:36 PM Anthony Luna  MRN:  [id number]  Subjective:   Anthony Luna is a 31 year old male with a history of schizophrenia admitted for psychotic break in the context of noncompliance.  He was restarted on medications, still experiences hallucinations.  Pt  still complains of auditory hallucinations , states "it make me angry" denies HI, SI. but the voices are getting quieter.  His mood is good.  He is out in the community and is with peers and staff appropriately. Taking meds, some tremor of hands, lithium level 0.56,    Treatment plan.  We will continue Prolixin 10 mg twice daily for psychosis, amantadine 100 mg twice daily for EPS prevention as he has a slight shake in the left hand, and lithium 450 mg twice daily for mood stabilization.  The patient is also on [place] in the community.  Social/disposition.  The patient lives in an independent apartment.  He will follow up with [facility].  Principal Problem: Schizoaffective disorder, bipolar type ([facility]) Diagnosis:   Patient Active Problem List   Diagnosis Date Noted  . Cannabis use disorder, severe, dependence ([facility]) [F12.20] 11/30/2014  . Schizoaffective disorder, bipolar type ([facility]) [F25.0] 11/30/2014  . Tobacco use disorder [F17.200] 11/30/2014   Total Time spent with patient: 20 minutes  Past Psychiatric History: Schizophrenia.  Past Medical History:  Past Medical History:  Diagnosis Date  . None to low serum cortisol response with adrenocorticotrophic hormone (ACTH) stimulation test   . Schizophrenia, paranoid type ([facility])    History reviewed. No pertinent surgical history. Family History: History reviewed. No pertinent family history. Family Psychiatric  History: Schizophrenia. Social History:  History  Alcohol Use No     History  Drug Use  . Types: Marijuana    Social History   Social History  . Marital status: Single    Spouse name: N/A  . Number of children: N/A   . Years of education: N/A   Social History Main Topics  . Smoking status: Current Every Day Smoker    Packs/day: 0.50    Types: Cigarettes    Start date: 11/29/2002  . Smokeless tobacco: Current User  . Alcohol use No  . Drug use: Yes    Types: Marijuana  . Sexual activity: Not Asked   Other Topics Concern  . None   Social History Narrative  . None   Additional Social History:    Pain Medications: Pt denies abuse Prescriptions: Pt denies abuse Over the Counter: Pt denies abuse History of alcohol / drug use?: Yes Longest period of sobriety (when/how long): Not reported Name of Substance 1: THC 1 - Age of First Use: 16 1 - Amount (size/oz): 2 blunts per week 1 - Frequency: 2-3x per week 1 - Duration: Ongoing 1 - Last Use / Amount: Today                  Sleep: Fair  Appetite:  Fair  Current Medications: Current Facility-Administered Medications  Medication Dose Route Frequency Provider Last Rate Last Dose  . acetaminophen (TYLENOL) tablet 650 mg  650 mg Oral Q6H PRN [name], [name], MD   650 mg at 04/08/17 0115  . alum & mag hydroxide-simeth (MAALOX/MYLANTA) 200-200-20 MG/5ML suspension 30 mL  30 mL Oral Q4H PRN [name], [name], MD      . amantadine (SYMMETREL) capsule 100 mg  100 mg Oral BID [name], [name], MD   100 mg at 04/11/17 0740  .  amoxicillin (AMOXIL) capsule 500 mg  500 mg Oral Q8H Pucilowska, Jolanta B, MD   500 mg at 04/11/17 1323  . fluPHENAZine (PROLIXIN) tablet 10 mg  10 mg Oral BID [name], [name], MD   10 mg at 04/11/17 0740  . hydrOXYzine (ATARAX/VISTARIL) tablet 25 mg  25 mg Oral TID PRN [name], [name], MD   25 mg at 04/09/17 2223  . lithium carbonate (ESKALITH) CR tablet 450 mg  450 mg Oral Q12H [name], [name], MD   450 mg at 04/11/17 0740  . magnesium hydroxide (MILK OF MAGNESIA) suspension 30 mL  30 mL Oral Daily PRN [name], [name], MD      . neomycin-bacitracin-polymyxin (NEOSPORIN) ointment   Topical BID Pucilowska, Jolanta B, MD   1  application at 04/11/17 0742  . nicotine (NICODERM CQ - dosed in mg/24 hours) patch 21 mg  21 mg Transdermal Daily Pucilowska, Jolanta B, MD   21 mg at 04/11/17 1051  . temazepam (RESTORIL) capsule 15 mg  15 mg Oral QHS Pucilowska, Jolanta B, MD   15 mg at 04/10/17 2214    Lab Results:  Results for orders placed or performed during the hospital encounter of 04/07/17 (from the past 48 hour(s))  Lithium level     Status: Abnormal   Collection Time: 04/11/17  6:55 AM  Result Value Ref Range   Lithium Lvl 0.56 (L) 0.60 - 1.20 mmol/L    Blood Alcohol level:  Lab Results  Component Value Date   ETH <10 04/07/2017   ETH <5 12/06/2016    Metabolic Disorder Labs: Lab Results  Component Value Date   HGBA1C 5.3 04/08/2017   MPG 105.41 04/08/2017   Lab Results  Component Value Date   PROLACTIN 6.1 12/19/2014   PROLACTIN 1.3 (L) 11/30/2014   Lab Results  Component Value Date   CHOL 158 04/08/2017   TRIG 45 04/08/2017   HDL 54 04/08/2017   CHOLHDL 2.9 04/08/2017   VLDL 9 04/08/2017   LDLCALC 95 04/08/2017   LDLCALC 78 12/19/2014    Physical Findings: AIMS: Facial and Oral Movements Muscles of Facial Expression: None, normal Lips and Perioral Area: None, normal Jaw: None, normal Tongue: None, normal,Extremity Movements Upper (arms, wrists, hands, fingers): None, normal Lower (legs, knees, ankles, toes): None, normal, Trunk Movements Neck, shoulders, hips: None, normal, Overall Severity Severity of abnormal movements (highest score from questions above): None, normal Incapacitation due to abnormal movements: None, normal Patient's awareness of abnormal movements (rate only patient's report): No Awareness, Dental Status Current problems with teeth and/or dentures?: Yes (wisdom tooth L) bottom area (coming in per pt)) Does patient usually wear dentures?: No  CIWA:    COWS:  COWS Total Score: 0  Musculoskeletal: Strength & Muscle Tone: within normal limits Gait & Station:  normal Patient leans: N/A  Psychiatric Specialty Exam: Physical Exam  Nursing note and vitals reviewed. Psychiatric: He has a normal mood and affect. His speech is normal. Judgment and thought content normal. He is actively hallucinating. Cognition and memory are normal.    Review of Systems  Neurological: Negative.   Psychiatric/Behavioral: Positive for hallucinations. The patient has insomnia.   All other systems reviewed and are negative.   Blood pressure 122/64, pulse 86, temperature 98.5 F (36.9 C), resp. rate 18, height 6\' 1"  (1.854 m), weight 85.3 kg (188 lb), SpO2 100 %.Body mass index is 24.8 kg/m.  General Appearance: Casual  Eye Contact:  Good  Speech:  Clear and Coherent  Volume:  Normal  Mood:  Euthymic  Affect:  Appropriate  Thought Process:  Goal Directed and Descriptions of Associations: Intact  Orientation:  Full (Time, Place, and Person)  Thought Content:  AH  Suicidal Thoughts:  No  Homicidal Thoughts:  No  Memory:  Immediate;   Fair Recent;   Fair Remote;   Fair  Judgement:  Poor  Insight:  Shallow  Psychomotor Activity:  Normal  Concentration:  Concentration: Fair and Attention Span: Fair  Recall:  [employer] of Knowledge:  Fair  Language:  Fair  Akathisia:  No  Handed:  Right  AIMS (if indicated):     Assets:  Communication Skills Desire for Improvement Financial Resources/Insurance Housing Physical Health Resilience Social Support  ADL's:  Intact  Cognition:  WNL  Sleep:  Number of Hours: 7.15     Treatment Plan Summary: Daily contact with patient to assess and evaluate symptoms and progress in treatment and Medication management   Anthony Luna is a 31 year old male with a history of schizophrenia admitted for psychotic break in the context of partial treatment compliance and substance abuse.  # Psychosis, improving - Continue Prolixin 10 mg BID - Continue Amantadine 100 mg BID - increase Lithium 600 mg BID, level 0.56 on 450mg  bid  dose,  - Continue Invega Trinza injections in the community, last dose date unknown at this point  # Substance abuse - Positive for cannabis - Patient not motivated for change  # Insomnia - did not respond to Trazodone  - Restoril 15 mg nightly    # Anxiety -we will not give benzodiazepines - Vistaril is available  # Smoking - Nicotine patch is available  # Metabolic syndrome monitoring Rpt cmp in am.  -Lipid panel, TSH, hemoglobin A1c  -EKG, QTC 394  # Disposition -discharge back to his apartment -Follow-up with [facility]    Beverly Sessions, MD 04/11/2017, 1:36 PMPatient ID: Anthony Luna, male   DOB: 1986/03/06, 31 y.o.   MRN: [id number]

## 2017-04-11 NOTE — [facility] Group Notes (Signed)
[facility] Group Notes:  (Nursing/MHT/Case Management/Adjunct)  Date:  04/11/2017  Time:  4:38 AM  Type of Therapy:  Psychoeducational Skills  Participation Level:  Did Not Attend   Summary of Progress/Problems:  Anthony Luna 04/11/2017, 4:38 AM

## 2017-04-12 LAB — COMPREHENSIVE METABOLIC PANEL
ALBUMIN: 4 g/dL (ref 3.5–5.0)
ALK PHOS: 77 U/L (ref 38–126)
ALT: 37 U/L (ref 17–63)
AST: 31 U/L (ref 15–41)
Anion gap: 6 (ref 5–15)
BILIRUBIN TOTAL: 0.9 mg/dL (ref 0.3–1.2)
BUN: 16 mg/dL (ref 6–20)
CALCIUM: 9.8 mg/dL (ref 8.9–10.3)
CO2: 29 mmol/L (ref 22–32)
CREATININE: 1.33 mg/dL — AB (ref 0.61–1.24)
Chloride: 103 mmol/L (ref 101–111)
GFR calc Af Amer: >60 mL/min (ref >60)
GFR calc non Af Amer: >60 mL/min (ref >60)
GLUCOSE: 96 mg/dL (ref 65–99)
Potassium: 4.3 mmol/L (ref 3.5–5.1)
Sodium: 138 mmol/L (ref 135–145)
TOTAL PROTEIN: 7.7 g/dL (ref 6.5–8.1)

## 2017-04-12 NOTE — Progress Notes (Signed)
Received Anthony Luna Anthony Luna. He was approached and directed to go to the medication Luna in 3 minutes for his AM medications. He looked and acknowledged Anthony Clinical research associatewriter, but I had to return to the day Luna and escort [facility] to the [facility].  He was focused on his lithium amount being increased. Anthony Clinical research associatewriter reviewed his past levels and explained the therapeutic range for the medication to be effective. He continued to have a puzzle look on his face. He stated the voices are gone Anthony AM and he is feeling a little irritated.

## 2017-04-12 NOTE — Progress Notes (Signed)
Stands and stares in the hallway at times. Pleasant and smiling on contact. Was med compliant. Endorses AH and states the voices say bad things about him. Also states that they are lessened at this time. No physical complaints. Remains on routine obs for safety.

## 2017-04-12 NOTE — Progress Notes (Signed)
[facility] MD Progress Note  04/12/2017 2:19 PM Anthony Luna  MRN:  [id number]  Subjective:   Anthony Luna is a 31 year old male with a history of schizophrenia admitted for psychotic break in the context of noncompliance.  He was restarted on medications, still experiences hallucinations.  Pt reports voices are better,  denies HI, SI.  His mood is good.  He is in dayroom, and interacting with peers and staff appropriately. Taking meds, tolerating well.   Treatment plan.  We will continue Prolixin 10 mg twice daily for psychosis, amantadine 100 mg twice daily for EPS prevention as he has a slight shake in the left hand, and lithium 600 mg twice daily for mood stabilization.  The patient is also on [place] in the community.  Social/disposition.  The patient lives in an independent apartment.  He will follow up with [facility].  Principal Problem: Schizoaffective disorder, bipolar type ([place]) Diagnosis:   Patient Active Problem List   Diagnosis Date Noted  . Cannabis use disorder, severe, dependence ([place]) [F12.20] 11/30/2014  . Schizoaffective disorder, bipolar type ([place]) [F25.0] 11/30/2014  . Tobacco use disorder [F17.200] 11/30/2014   Total Time spent with patient: 20 minutes  Past Psychiatric History: Schizophrenia.  Past Medical History:  Past Medical History:  Diagnosis Date  . None to low serum cortisol response with adrenocorticotrophic hormone (ACTH) stimulation test   . Schizophrenia, paranoid type ([place])    History reviewed. No pertinent surgical history. Family History: History reviewed. No pertinent family history. Family Psychiatric  History: Schizophrenia. Social History:  History  Alcohol Use No     History  Drug Use  . Types: Marijuana    Social History   Social History  . Marital status: Single    Spouse name: N/A  . Number of children: N/A  . Years of education: N/A   Social History Main Topics  . Smoking status: Current Every Day Smoker     Packs/day: 0.50    Types: Cigarettes    Start date: 11/29/2002  . Smokeless tobacco: Current User  . Alcohol use No  . Drug use: Yes    Types: Marijuana  . Sexual activity: Not Asked   Other Topics Concern  . None   Social History Narrative  . None   Additional Social History:    Pain Medications: Pt denies abuse Prescriptions: Pt denies abuse Over the Counter: Pt denies abuse History of alcohol / drug use?: Yes Longest period of sobriety (when/how long): Not reported Name of Substance 1: THC 1 - Age of First Use: 16 1 - Amount (size/oz): 2 blunts per week 1 - Frequency: 2-3x per week 1 - Duration: Ongoing 1 - Last Use / Amount: Today                  Sleep: Fair  Appetite:  Fair  Current Medications: Current Facility-Administered Medications  Medication Dose Route Frequency Provider Last Rate Last Dose  . acetaminophen (TYLENOL) tablet 650 mg  650 mg Oral Q6H PRN [name], [name], MD   650 mg at 04/08/17 0115  . alum & mag hydroxide-simeth (MAALOX/MYLANTA) 200-200-20 MG/5ML suspension 30 mL  30 mL Oral Q4H PRN [name], [name], MD      . amantadine (SYMMETREL) capsule 100 mg  100 mg Oral BID [name], [name], MD   100 mg at 04/12/17 0940  . amoxicillin (AMOXIL) capsule 500 mg  500 mg Oral Q8H Pucilowska, Jolanta B, MD   500 mg at 04/12/17 1308  .  fluPHENAZine (PROLIXIN) tablet 10 mg  10 mg Oral BID [name], [name], MD   10 mg at 04/12/17 7253  . hydrOXYzine (ATARAX/VISTARIL) tablet 25 mg  25 mg Oral TID PRN [name], [name], MD   25 mg at 04/09/17 2223  . lithium carbonate (LITHOBID) CR tablet 600 mg  600 mg Oral Q12H Lenward Chancellor, MD   600 mg at 04/12/17 0939  . magnesium hydroxide (MILK OF MAGNESIA) suspension 30 mL  30 mL Oral Daily PRN [name], [name], MD      . neomycin-bacitracin-polymyxin (NEOSPORIN) ointment   Topical BID Pucilowska, Jolanta B, MD      . nicotine (NICODERM CQ - dosed in mg/24 hours) patch 21 mg  21 mg Transdermal Daily Pucilowska,  Jolanta B, MD   21 mg at 04/12/17 0941  . temazepam (RESTORIL) capsule 15 mg  15 mg Oral QHS Pucilowska, Jolanta B, MD   15 mg at 04/11/17 2112    Lab Results:  Results for orders placed or performed during the hospital encounter of 04/07/17 (from the past 48 hour(s))  Lithium level     Status: Abnormal   Collection Time: 04/11/17  6:55 AM  Result Value Ref Range   Lithium Lvl 0.56 (L) 0.60 - 1.20 mmol/L  Comprehensive metabolic panel     Status: Abnormal   Collection Time: 04/12/17  6:12 AM  Result Value Ref Range   Sodium 138 135 - 145 mmol/L   Potassium 4.3 3.5 - 5.1 mmol/L   Chloride 103 101 - 111 mmol/L   CO2 29 22 - 32 mmol/L   Glucose, Bld 96 65 - 99 mg/dL   BUN 16 6 - 20 mg/dL   Creatinine, Ser 1.33 (H) 0.61 - 1.24 mg/dL   Calcium 9.8 8.9 - 10.3 mg/dL   Total Protein 7.7 6.5 - 8.1 g/dL   Albumin 4.0 3.5 - 5.0 g/dL   AST 31 15 - 41 U/L   ALT 37 17 - 63 U/L   Alkaline Phosphatase 77 38 - 126 U/L   Total Bilirubin 0.9 0.3 - 1.2 mg/dL   GFR calc non Af Amer >60 >60 mL/min   GFR calc Af Amer >60 >60 mL/min    Comment: (NOTE) The eGFR has been calculated using the CKD EPI equation. This calculation has not been validated in all clinical situations. eGFR's persistently <60 mL/min signify possible Chronic Kidney Disease.    Anion gap 6 5 - 15    Blood Alcohol level:  Lab Results  Component Value Date   ETH <10 04/07/2017   ETH <5 66/44/0347    Metabolic Disorder Labs: Lab Results  Component Value Date   HGBA1C 5.3 04/08/2017   MPG 105.41 04/08/2017   Lab Results  Component Value Date   PROLACTIN 6.1 12/19/2014   PROLACTIN 1.3 (L) 11/30/2014   Lab Results  Component Value Date   CHOL 158 04/08/2017   TRIG 45 04/08/2017   HDL 54 04/08/2017   CHOLHDL 2.9 04/08/2017   VLDL 9 04/08/2017   LDLCALC 95 04/08/2017   LDLCALC 78 12/19/2014    Physical Findings: AIMS: Facial and Oral Movements Muscles of Facial Expression: None, normal Lips and Perioral Area:  None, normal Jaw: None, normal Tongue: None, normal,Extremity Movements Upper (arms, wrists, hands, fingers): None, normal Lower (legs, knees, ankles, toes): None, normal, Trunk Movements Neck, shoulders, hips: None, normal, Overall Severity Severity of abnormal movements (highest score from questions above): None, normal Incapacitation due to abnormal movements: None, normal Patient's  awareness of abnormal movements (rate only patient's report): No Awareness, Dental Status Current problems with teeth and/or dentures?: Yes (wisdom tooth L) bottom area (coming in per pt)) Does patient usually wear dentures?: No  CIWA:    COWS:  COWS Total Score: 0  Musculoskeletal: Strength & Muscle Tone: within normal limits Gait & Station: normal Patient leans: N/A  Psychiatric Specialty Exam: Physical Exam  Nursing note and vitals reviewed. Psychiatric: He has a normal mood and affect. His speech is normal. Judgment and thought content normal. He is actively hallucinating. Cognition and memory are normal.    Review of Systems  Neurological: Negative.   Psychiatric/Behavioral: Positive for hallucinations. The patient has insomnia.   All other systems reviewed and are negative.   Blood pressure 118/80, pulse 80, temperature 98.5 F (36.9 C), resp. rate 18, height _0  (1.854 m), weight 85.3 kg (188 lb), SpO2 100 %.Body mass index is 24.8 kg/m.  General Appearance: Casual  Eye Contact:  Good  Speech:  Clear and Coherent  Volume:  Normal  Mood:  Euthymic  Affect:  Appropriate  Thought Process:  Goal Directed and Descriptions of Associations: Intact  Orientation:  Full (Time, Place, and Person)  Thought Content:  AH better  Suicidal Thoughts:  No  Homicidal Thoughts:  No  Memory:  Immediate;   Fair Recent;   Fair Remote;   Fair  Judgement:  Poor  Insight:  Shallow  Psychomotor Activity:  Normal  Concentration:  Concentration: Fair and Attention Span: Fair  Recall:  [employer] of  Knowledge:  Fair  Language:  Fair  Akathisia:  No  Handed:  Right  AIMS (if indicated):     Assets:  Communication Skills Desire for Improvement Financial Resources/Insurance Housing Physical Health Resilience Social Support  ADL's:  Intact  Cognition:  WNL  Sleep:  Number of Hours: 6.45     Treatment Plan Summary: Daily contact with patient to assess and evaluate symptoms and progress in treatment and Medication management   Mr. Anthony Luna is a 31 year old male with a history of schizophrenia admitted for psychotic break in the context of partial treatment compliance and substance abuse.  # Psychosis, improving - Continue Prolixin 10 mg BID - Continue Amantadine 100 mg BID - increased Lithium 600 mg BID on 10/27,  level 0.56 on 450m bid dose,  - Continue Invega Trinza injections in the community, last dose date unknown at this point  # Substance abuse - Positive for cannabis - Patient not motivated for change   # Insomnia - did not respond to Trazodone  - Restoril 15 mg nightly    # Anxiety -we will not give benzodiazepines - Vistaril is available  # Smoking - Nicotine patch is available  # Metabolic syndrome monitoring Rpt cmp in am.  -Lipid panel, TSH, hemoglobin A1c  -EKG, QTC 394  # Disposition -discharge back to his apartment -Follow-up with [facility]   JLenward Chancellor MD 04/12/2017, 2:19 PMPatient ID: BLennon Luna male   DOB: 1Jul 04, 1987 31y.o.   MRN: [id number] ID: BPatterson Luna male   DOB: [birth year] 31y.o.   MRN: [id number]

## 2017-04-12 NOTE — [facility] Group Notes (Signed)
LCSW Group Therapy Note  04/12/2017 1:15pm  Type of Therapy/Topic:  Group Therapy:  Emotion Regulation  Participation Level:  Minimal   Description of Group:   The purpose of this group is to assist patients in learning to regulate negative emotions and experience positive emotions. Patients will be guided to discuss ways in which they have been vulnerable to their negative emotions. These vulnerabilities will be juxtaposed with experiences of positive emotions or situations, and patients will be challenged to use positive emotions to combat negative ones. Special emphasis will be placed on coping with negative emotions in conflict situations, and patients will process healthy conflict resolution skills.  Therapeutic Goals: 1. Patient will identify two positive emotions or experiences to reflect on in order to balance out negative emotions 2. Patient will label two or more emotions that they find the most difficult to experience 3. Patient will demonstrate positive conflict resolution skills through discussion and/or role plays  Summary of Patient Progress:  Pt attended group and stayed the entire time. He attempted to participate in group. He appeared to have limited insight.      Therapeutic Modalities:   Cognitive Behavioral Therapy Feelings Identification Dialectical Behavioral Therapy   Rondall AllegraCandace L Keisa Blow, LCSW 04/12/2017 2:39 PM

## 2017-04-12 NOTE — Plan of Care (Signed)
Problem: Self-Concept: Goal: Ability to identify factors that promote anxiety will improve Outcome: Not Progressing Brand hasn't been able to identify coping skills to date. Goal: Level of anxiety will decrease Outcome: Progressing Anthony JunesBrandon tated his anxiety has decreased since his admission.

## 2017-04-13 MED ORDER — LITHIUM CARBONATE ER 300 MG PO TBCR: 600.0000 mg | EXTENDED_RELEASE_TABLET | Freq: Two times a day (BID) | ORAL | 1 refills | Status: DC

## 2017-04-13 MED ORDER — AMOXICILLIN 500 MG PO CAPS: 500.0000 mg | ORAL_CAPSULE | Freq: Three times a day (TID) | ORAL | 0 refills | Status: DC

## 2017-04-13 MED ORDER — PROPRANOLOL HCL 10 MG PO TABS: 10.0000 mg | ORAL_TABLET | Freq: Three times a day (TID) | ORAL | 1 refills | Status: DC

## 2017-04-13 MED ORDER — PROPRANOLOL HCL 20 MG PO TABS: 10.0000 mg | ORAL_TABLET | Freq: Three times a day (TID) | ORAL | Status: DC

## 2017-04-13 NOTE — Progress Notes (Signed)
Patient was provided with discharge summary, Transition packet, and Suicide Risk Assessment. Verbalized discharge summary, transition packet and suicide risk assessment, patient made aware of any change in medications, and all upcoming appointments.   Patient belongings returned, and physical Rx provided to patient.   Patient denied any SI, denies plans for self harm or the harm of others. Patient is calm, with appropriate affect and eye contact. Patient reports no complaints at this time.   [place] [facility] [place]

## 2017-04-13 NOTE — Progress Notes (Signed)
  [facility] :  Will you be returning to the same living situation after discharge:  Yes,    At discharge, do you have transportation home?: Yes,    Do you have the ability to pay for your medications: Yes,     Release of information consent forms completed and in the chart;  Patient's signature needed at discharge.  Patient to Follow up at: Follow-up Information    [facility] Follow up.   Why:  Hospital discharge follow up on 10/31 and 11/7 at 9 AM.  Has peer support worker w this agency as well. Contact information: [address] [place] [id number] [phone]           Next level of care provider [facility] Link:no  Safety Planning and Suicide Prevention discussed: Yes,     Have you used any form of tobacco in the last 30 days? (Cigarettes, Smokeless Tobacco, Cigars, and/or Pipes): Yes  Has patient been referred to the Quitline?: Patient refused referral  Patient has been referred for addiction treatment: Yes  Glennon MacSara P [place], LCSW 04/13/2017, 4:40 PM
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
## 2017-04-13 NOTE — BHH Suicide Risk Assessment (Signed)
[facility]   Principal Problem: Schizoaffective disorder, bipolar type [facility](HCC) Discharge Diagnoses:  Patient Active Problem List   Diagnosis Date Noted  . Cannabis use disorder, severe, dependence (HCC) [F12.20] 11/30/2014  . Schizoaffective disorder, bipolar type (HCC) [F25.0] 11/30/2014  . Tobacco use disorder [F17.200] 11/30/2014    Total Time spent with patient: 30 minutes  Musculoskeletal: Strength & Muscle Tone: within normal limits Gait & Station: normal Patient leans: N/A  Psychiatric Specialty Exam: Review of Systems  Neurological: Negative.   Psychiatric/Behavioral: Negative.   All other systems reviewed and are negative.   Blood pressure 122/80, pulse 66, temperature 98.3 F (36.8 C), resp. rate 18, height 6\' 1"  (1.854 m), weight 85.3 kg (188 lb), SpO2 100 %.Body mass index is 24.8 kg/m.  General Appearance: Casual  Eye Contact::  Good  Speech:  Clear and Coherent409  Volume:  Normal  Mood:  Euthymic  Affect:  Appropriate  Thought Process:  Goal Directed and Descriptions of Associations: Intact  Orientation:  Full (Time, Place, and Person)  Thought Content:  WDL  Suicidal Thoughts:  No  Homicidal Thoughts:  No  Memory:  Immediate;   Fair Recent;   Fair Remote;   Fair  Judgement:  Impaired  Insight:  Shallow  Psychomotor Activity:  Normal  Concentration:  Fair  Recall:  FiservFair  Fund of Knowledge:Fair  Language: Fair  Akathisia:  No  Handed:  Right  AIMS (if indicated):     Assets:  Communication Skills Desire for Improvement Financial Resources/Insurance Housing Physical Health Resilience Social Support  Sleep:  Number of Hours: 7.3  Cognition: WNL  ADL's:  Intact   Mental Status Per Nursing Assessment::   On Admission:     Demographic Factors:  Male and Living alone  Loss Factors: NA  Historical Factors: Family history of mental illness or substance abuse and Impulsivity  Risk Reduction Factors:   Sense of  responsibility to family, Positive social support and Positive therapeutic relationship  Continued Clinical Symptoms:  Bipolar Disorder:   Depressive phase  Cognitive Features That Contribute To Risk:  None    Suicide Risk:  Minimal: No identifiable suicidal ideation.  Patients presenting with no risk factors but with morbid ruminations; may be classified as minimal risk based on the severity of the depressive symptoms  Follow-up Information    [facility] Follow up.   Why:  Hospital discharge follow up on 10/31 and 11/7 at 9 AM.  Has peer support worker w this agency as well. Contact information: [address] [place] [id number] [phone]           Plan Of Care/Follow-up recommendations:  Activity:  as tolerated Diet:  regular  Other:  keep follow up appointments  Kristine LineaJolanta Teryn Boerema, MD 04/13/2017, 8:23 AM

## 2017-09-25 ENCOUNTER — Inpatient Hospital Stay
Admission: AD | Admit: 2017-09-25 | Discharge: 2017-10-09 | DRG: 885 | Disposition: A | Discharge status: Home / Self Care | Payer: Medicaid Other | Attending: Psychiatry | Admitting: Psychiatry

## 2017-09-25 ENCOUNTER — Other Ambulatory Visit: Payer: Self-pay

## 2017-09-25 ENCOUNTER — Emergency Department
Admission: EM | Admit: 2017-09-25 | Discharge: 2017-09-25 | Disposition: A | Discharge status: Other Acute Inpatient Hospital | Payer: Medicaid Other | Attending: Emergency Medicine | Admitting: Emergency Medicine

## 2017-09-25 ENCOUNTER — Encounter: Payer: Self-pay | Admitting: Emergency Medicine

## 2017-09-25 DIAGNOSIS — F209 Schizophrenia, unspecified: Secondary | ICD-10-CM | POA: Diagnosis not present

## 2017-09-25 DIAGNOSIS — R45851 Suicidal ideations: Secondary | ICD-10-CM | POA: Insufficient documentation

## 2017-09-25 DIAGNOSIS — F259 Schizoaffective disorder, unspecified: Secondary | ICD-10-CM | POA: Diagnosis not present

## 2017-09-25 DIAGNOSIS — F17210 Nicotine dependence, cigarettes, uncomplicated: Secondary | ICD-10-CM | POA: Diagnosis present

## 2017-09-25 DIAGNOSIS — Z9119 Patient's noncompliance with other medical treatment and regimen: Secondary | ICD-10-CM | POA: Insufficient documentation

## 2017-09-25 DIAGNOSIS — R000 Tachycardia, unspecified: Secondary | ICD-10-CM | POA: Diagnosis not present

## 2017-09-25 DIAGNOSIS — F1290 Cannabis use, unspecified, uncomplicated: Secondary | ICD-10-CM | POA: Diagnosis present

## 2017-09-25 DIAGNOSIS — F251 Schizoaffective disorder, depressive type: Secondary | ICD-10-CM | POA: Diagnosis not present

## 2017-09-25 DIAGNOSIS — Z79899 Other long term (current) drug therapy: Secondary | ICD-10-CM | POA: Insufficient documentation

## 2017-09-25 DIAGNOSIS — F1210 Cannabis abuse, uncomplicated: Secondary | ICD-10-CM

## 2017-09-25 DIAGNOSIS — Z716 Tobacco abuse counseling: Secondary | ICD-10-CM | POA: Diagnosis not present

## 2017-09-25 DIAGNOSIS — R251 Tremor, unspecified: Secondary | ICD-10-CM | POA: Diagnosis not present

## 2017-09-25 DIAGNOSIS — Z5181 Encounter for therapeutic drug level monitoring: Secondary | ICD-10-CM | POA: Diagnosis not present

## 2017-09-25 DIAGNOSIS — F250 Schizoaffective disorder, bipolar type: Secondary | ICD-10-CM | POA: Diagnosis present

## 2017-09-25 DIAGNOSIS — F1220 Cannabis dependence, uncomplicated: Secondary | ICD-10-CM | POA: Diagnosis present

## 2017-09-25 DIAGNOSIS — Z046 Encounter for general psychiatric examination, requested by authority: Secondary | ICD-10-CM | POA: Diagnosis present

## 2017-09-25 HISTORY — DX: Depression, unspecified: F32.A

## 2017-09-25 HISTORY — DX: Major depressive disorder, single episode, unspecified: F32.9

## 2017-09-25 LAB — COMPREHENSIVE METABOLIC PANEL
ALBUMIN: 4.5 g/dL (ref 3.5–5.0)
ALK PHOS: 54 U/L (ref 38–126)
ALT: 25 U/L (ref 17–63)
AST: 25 U/L (ref 15–41)
Anion gap: 4 — ABNORMAL LOW (ref 5–15)
BUN: 10 mg/dL (ref 6–20)
CALCIUM: 9.3 mg/dL (ref 8.9–10.3)
CO2: 29 mmol/L (ref 22–32)
Chloride: 106 mmol/L (ref 101–111)
Creatinine, Ser: 1.16 mg/dL (ref 0.61–1.24)
GFR calc Af Amer: >60 mL/min (ref >60)
GFR calc non Af Amer: >60 mL/min (ref >60)
GLUCOSE: 103 mg/dL — AB (ref 65–99)
Potassium: 4.1 mmol/L (ref 3.5–5.1)
SODIUM: 139 mmol/L (ref 135–145)
TOTAL PROTEIN: 7.5 g/dL (ref 6.5–8.1)
Total Bilirubin: 0.5 mg/dL (ref 0.3–1.2)

## 2017-09-25 LAB — CBC
HCT: 46.9 % (ref 40.0–52.0)
Hemoglobin: 16 g/dL (ref 13.0–18.0)
MCH: 31.8 pg (ref 26.0–34.0)
MCHC: 34.1 g/dL (ref 32.0–36.0)
MCV: 93.2 fL (ref 80.0–100.0)
PLATELETS: 179 10*3/uL (ref 150–440)
RBC: 5.04 MIL/uL (ref 4.40–5.90)
RDW: 14 % (ref 11.5–14.5)
WBC: 7.3 10*3/uL (ref 3.8–10.6)

## 2017-09-25 LAB — URINE DRUG SCREEN, QUALITATIVE (ARMC ONLY)
Amphetamines, Ur Screen: NOT DETECTED
BARBITURATES, UR SCREEN: NOT DETECTED
Benzodiazepine, Ur Scrn: NOT DETECTED
CANNABINOID 50 NG, UR ~~LOC~~: POSITIVE — AB
Cocaine Metabolite,Ur ~~LOC~~: NOT DETECTED
MDMA (Ecstasy)Ur Screen: NOT DETECTED
Methadone Scn, Ur: NOT DETECTED
Opiate, Ur Screen: NOT DETECTED
PHENCYCLIDINE (PCP) UR S: NOT DETECTED
TRICYCLIC, UR SCREEN: NOT DETECTED

## 2017-09-25 LAB — SALICYLATE LEVEL

## 2017-09-25 LAB — ETHANOL: Alcohol, Ethyl (B): <10 mg/dL (ref <10)

## 2017-09-25 LAB — LITHIUM LEVEL: LITHIUM LVL: 0.46 mmol/L — AB (ref 0.60–1.20)

## 2017-09-25 LAB — ACETAMINOPHEN LEVEL: Acetaminophen (Tylenol), Serum: <10 ug/mL — ABNORMAL LOW (ref 10–30)

## 2017-09-25 MED ORDER — LITHIUM CARBONATE ER 450 MG PO TBCR
450.0000 mg | EXTENDED_RELEASE_TABLET | Freq: Two times a day (BID) | ORAL | Status: DC
  Administered 2017-09-25 – 2017-10-09 (×28): 450 mg via ORAL
  Filled 2017-09-25 (×28): qty 1

## 2017-09-25 MED ORDER — PROPRANOLOL HCL 20 MG PO TABS
10.0000 mg | ORAL_TABLET | Freq: Three times a day (TID) | ORAL | Status: DC
  Administered 2017-09-26 – 2017-10-01 (×17): 10 mg via ORAL
  Administered 2017-10-01: 09:00:00 via ORAL
  Administered 2017-10-02 – 2017-10-09 (×20): 10 mg via ORAL
  Filled 2017-09-25 (×40): qty 1

## 2017-09-25 MED ORDER — AMANTADINE HCL 100 MG PO CAPS
100.0000 mg | ORAL_CAPSULE | Freq: Two times a day (BID) | ORAL | Status: DC
  Administered 2017-09-25 – 2017-10-09 (×28): 100 mg via ORAL
  Filled 2017-09-25 (×29): qty 1

## 2017-09-25 MED ORDER — LITHIUM CARBONATE ER 450 MG PO TBCR
450.0000 mg | EXTENDED_RELEASE_TABLET | Freq: Two times a day (BID) | ORAL | Status: DC
  Administered 2017-09-25: 450 mg via ORAL
  Filled 2017-09-25 (×2): qty 1

## 2017-09-25 MED ORDER — HYDROXYZINE HCL 50 MG PO TABS: 50.0000 mg | ORAL_TABLET | Freq: Three times a day (TID) | ORAL | Status: DC | PRN

## 2017-09-25 MED ORDER — MAGNESIUM HYDROXIDE 400 MG/5ML PO SUSP: 30.0000 mL | Freq: Every day | ORAL | Status: DC | PRN

## 2017-09-25 MED ORDER — AMANTADINE HCL 100 MG PO CAPS
100.0000 mg | ORAL_CAPSULE | Freq: Two times a day (BID) | ORAL | Status: DC
  Administered 2017-09-25: 100 mg via ORAL
  Filled 2017-09-25 (×2): qty 1

## 2017-09-25 MED ORDER — PROPRANOLOL HCL 20 MG PO TABS: 10.0000 mg | ORAL_TABLET | Freq: Three times a day (TID) | ORAL | Status: DC

## 2017-09-25 MED ORDER — ALUM & MAG HYDROXIDE-SIMETH 200-200-20 MG/5ML PO SUSP: 30.0000 mL | ORAL | Status: DC | PRN

## 2017-09-25 MED ORDER — TRAZODONE HCL 100 MG PO TABS
100.0000 mg | ORAL_TABLET | Freq: Every evening | ORAL | Status: DC | PRN
  Administered 2017-09-25 – 2017-09-26 (×2): 100 mg via ORAL
  Filled 2017-09-25 (×2): qty 1

## 2017-09-25 MED ORDER — FLUPHENAZINE HCL 5 MG PO TABS
10.0000 mg | ORAL_TABLET | Freq: Two times a day (BID) | ORAL | Status: DC
  Administered 2017-09-25: 10 mg via ORAL
  Filled 2017-09-25 (×2): qty 1

## 2017-09-25 MED ORDER — ACETAMINOPHEN 325 MG PO TABS: 650.0000 mg | ORAL_TABLET | Freq: Four times a day (QID) | ORAL | Status: DC | PRN

## 2017-09-25 MED ORDER — FLUPHENAZINE HCL 5 MG PO TABS
10.0000 mg | ORAL_TABLET | Freq: Two times a day (BID) | ORAL | Status: DC
  Administered 2017-09-25 – 2017-09-28 (×6): 10 mg via ORAL
  Filled 2017-09-25 (×6): qty 2

## 2017-09-25 NOTE — ED Notes (Signed)

## 2017-09-25 NOTE — ED Triage Notes (Signed)
Patient brought in by BPD for voluntary evaluation. Patient states that he has a history of schizophrenia and that he is hearing voices. Patient states that he has also had thoughts of hurting himself.

## 2017-09-25 NOTE — BH Assessment (Signed)
Per Dr. Shary Keylapac's, patient meets criteria for inpatient psychiatric treatment and pending bed assignment in BMU.

## 2017-09-25 NOTE — ED Notes (Signed)
Pt. Alert and oriented, warm and dry, in no distress. Pt. Denies SI, HI, and AVH. Pt states earlier he was hearing voices. But being around good people the voices stopped. Pt. Encouraged to let nursing staff know of any concerns or needs.

## 2017-09-25 NOTE — BH Assessment (Signed)
This Clinical research associatewriter provided [facility] Charge Nurse T'Yawn patient's MRN for bed assignment.

## 2017-09-25 NOTE — ED Notes (Signed)
ED BHU PLACEMENT JUSTIFICATION Is the patient under IVC or is there intent for IVC: No. Is the patient medically cleared: No. Is there vacancy in the ED BHU: Yes.   Is the population mix appropriate for patient: Yes.   Is the patient awaiting placement in inpatient or outpatient setting: No. Has the patient had a psychiatric consult: Yes.   Survey of unit performed for contraband, proper placement and condition of furniture, tampering with fixtures in bathroom, shower, and each patient room: Yes.  ; Findings: NA APPEARANCE/BEHAVIOR calm, cooperative and adequate rapport can be established NEURO ASSESSMENT Orientation: time, place and person Hallucinations: No.None noted (Hallucinations) Speech: Normal Gait: normal RESPIRATORY ASSESSMENT Normal expansion.  Clear to auscultation.  No rales, rhonchi, or wheezing. CARDIOVASCULAR ASSESSMENT regular rate and rhythm, S1, S2 normal, no murmur, click, rub or gallop GASTROINTESTINAL ASSESSMENT soft, nontender, BS WNL, no r/g EXTREMITIES normal strength, tone, and muscle mass PLAN OF CARE Provide calm/safe environment. Vital signs assessed twice daily. ED BHU Assessment once each 12-hour shift. Collaborate with intake RN daily or as condition indicates. Assure the ED provider has rounded once each shift. Provide and encourage hygiene. Provide redirection as needed. Assess for escalating behavior; address immediately and inform ED provider.  Assess family dynamic and appropriateness for visitation as needed: Yes.  ; If necessary, describe findings: NA Educate the patient/family about BHU procedures/visitation: Yes.  ; If necessary, describe findings: NA

## 2017-09-25 NOTE — BH Assessment (Addendum)
[facility] by Dr. Toni Amendlapacs.  Attending Physician will be Dr. Johnella MoloneyMcNew.   Patient has been [facility] to room 306[facility]   Intake Paper Work has been signed and placed on patient chart.  ER staff is aware of the admission:  Emily,ER Secretary   Dr. Don PerkingVeronese, ER MD   Maryelizabeth KaufmannGigi, Patient's Nurse   Ethelene BrownsAnthony, Patient Access.

## 2017-09-25 NOTE — ED Notes (Signed)
Patient dressed out in [place] colored paper scrubs by this RN.  Belongings packed in 2 white plastic bags and tied and ID labeled.  This RN escorted patient to 19H with Cala BradfordKimberly, Charity fundraiserN.  Patient is cooperative and behavior appropriate.

## 2017-09-25 NOTE — Consult Note (Signed)
[place] Psychiatry Consult   Reason for Consult: Consult for 32 year old man with a history of schizoaffective disorder who was brought to the emergency room voluntarily because of psychotic behavior Referring Physician: Sullivan Lone Patient Identification: Anthony Luna MRN:  [id number] Principal Diagnosis: Schizoaffective disorder, bipolar type [facility]) Diagnosis:   Patient Active Problem List   Diagnosis Date Noted  . Cannabis use disorder, severe, dependence ([place]) [F12.20] 11/30/2014  . Schizoaffective disorder, bipolar type ([place]) [F25.0] 11/30/2014  . Tobacco use disorder [F17.200] 11/30/2014    Total Time spent with patient: 1 hour  Subjective:   Anthony Luna is a 32 y.o. male patient admitted with "I was at Why".  HPI: Patient interviewed chart reviewed.  Patient known from previous encounters.  32 year old man with chronic mental illness.  Patient was brought in last night because he was drawing attention to himself at a local gas station pacing around talking to himself acting strange.  Patient tells me that he was there waiting to meet a friend and that he was having hallucinations.  He endorses that he has been having both visual and auditory hallucinations.  He is been feeling nervous.  He has not been going to school regularly the way he is supposed to.  He denies suicidal or homicidal thoughts.  Admits that he is not fully compliant with his medicine all the time although he does have an act team that visits him regularly.  Continues to smoke marijuana regularly.  Had a couple of beers recently but does not drink on a regular basis.  Patient admits that he is a little decompensated and that it would be a good idea to be hospitalized.  Social history: Patient is living independently but has an act team who check on him a couple times a week.  Stays in only occasional contact with his family.  He is supposed to be going to [employer].  Otherwise has very  little social life.  Medical history: No significant medical problems outside of the mental health  Substance abuse history: Marijuana use has complicated his psychotic disorder on frequent occasions.  Does not usually use other drugs.  Past Psychiatric History: Patient has a history of psychotic disorder with a diagnosis of schizoaffective disorder.  Multiple hospitalizations over the last few years with the most recent one being in October 2018.  No history of suicide attempts.  When he is sick however he will decompensate and get very withdrawn with poor self-care and psychosis.  He is supposed to be on long-acting injectable medicine as well as lithium and oral antipsychotics.  Risk to Self: Suicidal Ideation: Yes-Currently Present Suicidal Intent: No Is patient at risk for suicide?: Yes Suicidal Plan?: No Access to Means: Yes Specify Access to Suicidal Means: Pt reports to having "knives" at home What has been your use of drugs/alcohol within the last 12 months?: Marijuan How many times?: 0 Other Self Harm Risks: None indicated Triggers for Past Attempts: Hallucinations(Pt reports AH and VH) Intentional Self Injurious Behavior: None Risk to Others: Homicidal Ideation: No Thoughts of Harm to Others: No Current Homicidal Intent: No Current Homicidal Plan: No Access to Homicidal Means: No Identified Victim: N/A History of harm to others?: No Assessment of Violence: None Noted Violent Behavior Description: None indicated Does patient have access to weapons?: No Criminal Charges Pending?: No Does patient have a court date: No Prior Inpatient Therapy: Prior Inpatient Therapy: Yes Prior Therapy Dates: 2018, 2016 Prior Therapy Facilty/Provider[facility] Reason for Treatment: depression, schizophrenia  Prior Outpatient Therapy: Prior Outpatient Therapy: Yes Prior Therapy Dates: 2018-2019 Prior Therapy Facilty/Provider(s): [facility] Reason for Treatment: Depression,  schizophrenia Does patient have an ACCT team?: Yes(Pt reports he has ACCT team through [employer]) Does patient have [facility] services? : Unknown Does patient have P4CC services?: Unknown  Past Medical History:  Past Medical History:  Diagnosis Date  . None to low serum cortisol response with adrenocorticotrophic hormone (ACTH) stimulation test   . Schizophrenia, paranoid type ([place])    History reviewed. No pertinent surgical history. Family History: No family history on file. Family Psychiatric  History: None known Social History:  Social History   Substance and Sexual Activity  Alcohol Use Yes  . Alcohol/week: 0.0 oz     Social History   Substance and Sexual Activity  Drug Use Yes  . Types: Marijuana    Social History   Socioeconomic History  . Marital status: Single    Spouse name: Not on file  . Number of children: Not on file  . Years of education: Not on file  . Highest education level: Not on file  Occupational History  . Not on file  Social Needs  . Financial resource strain: Not on file  . Food insecurity:    Worry: Not on file    Inability: Not on file  . Transportation needs:    Medical: Not on file    Non-medical: Not on file  Tobacco Use  . Smoking status: Current Every Day Smoker    Packs/day: 0.50    Types: Cigarettes    Start date: 11/29/2002  . Smokeless tobacco: Former Network engineer and Sexual Activity  . Alcohol use: Yes    Alcohol/week: 0.0 oz  . Drug use: Yes    Types: Marijuana  . Sexual activity: Not on file  Lifestyle  . Physical activity:    Days per week: Not on file    Minutes per session: Not on file  . Stress: Not on file  Relationships  . Social connections:    Talks on phone: Not on file    Gets together: Not on file    Attends religious service: Not on file    Active member of club or organization: Not on file    Attends meetings of clubs or organizations: Not on file    Relationship status: Not on file   Other Topics Concern  . Not on file  Social History Narrative  . Not on file   Additional Social History:    Allergies:  No Known Allergies  Labs:  Results for orders placed or performed during the hospital encounter of 09/25/17 (from the past 48 hour(s))  Comprehensive metabolic panel     Status: Abnormal   Collection Time: 09/25/17  3:55 AM  Result Value Ref Range   Sodium 139 135 - 145 mmol/L   Potassium 4.1 3.5 - 5.1 mmol/L   Chloride 106 101 - 111 mmol/L   CO2 29 22 - 32 mmol/L   Glucose, Bld 103 (H) 65 - 99 mg/dL   BUN 10 6 - 20 mg/dL   Creatinine, Ser 1.16 0.61 - 1.24 mg/dL   Calcium 9.3 8.9 - 10.3 mg/dL   Total Protein 7.5 6.5 - 8.1 g/dL   Albumin 4.5 3.5 - 5.0 g/dL   AST 25 15 - 41 U/L   ALT 25 17 - 63 U/L   Alkaline Phosphatase 54 38 - 126 U/L   Total Bilirubin 0.5 0.3 - 1.2 mg/dL  GFR calc non Af Amer >60 >60 mL/min   GFR calc Af Amer >60 >60 mL/min    Comment: (NOTE) The eGFR has been calculated using the CKD EPI equation. This calculation has not been validated in all clinical situations. eGFR's persistently <60 mL/min signify possible Chronic Kidney Disease.    Anion gap 4 (L) 5 - 15    Comment: Performed at [facility], [address] [place], [place] [address]  Ethanol     Status: None   Collection Time: 09/25/17  3:55 AM  Result Value Ref Range   Alcohol, Ethyl (B) <10 <10 mg/dL    Comment:        LOWEST DETECTABLE LIMIT FOR SERUM ALCOHOL IS 10 mg/dL FOR MEDICAL PURPOSES ONLY Performed at [facility], [address] [place], [place] [address]   Salicylate level     Status: None   Collection Time: 09/25/17  3:55 AM  Result Value Ref Range   Salicylate Lvl <4.2 2.8 - 30.0 mg/dL    Comment: Performed at [facility], [address] [place], [place] [address]  Acetaminophen level     Status: Abnormal   Collection Time: 09/25/17  3:55 AM  Result Value Ref Range   Acetaminophen (Tylenol), Serum <10 (L) 10 - 30  ug/mL    Comment:        THERAPEUTIC CONCENTRATIONS VARY SIGNIFICANTLY. A RANGE OF 10-30 ug/mL MAY BE AN EFFECTIVE CONCENTRATION FOR MANY PATIENTS. HOWEVER, SOME ARE BEST TREATED AT CONCENTRATIONS OUTSIDE THIS RANGE. ACETAMINOPHEN CONCENTRATIONS >150 ug/mL AT 4 HOURS AFTER INGESTION AND >50 ug/mL AT 12 HOURS AFTER INGESTION ARE OFTEN ASSOCIATED WITH TOXIC REACTIONS. Performed at [facility], [address] [place], [place] [address]   cbc     Status: None   Collection Time: 09/25/17  3:55 AM  Result Value Ref Range   WBC 7.3 3.8 - 10.6 K/uL   RBC 5.04 4.40 - 5.90 MIL/uL   Hemoglobin 16.0 13.0 - 18.0 g/dL   HCT 46.9 40.0 - 52.0 %   MCV 93.2 80.0 - 100.0 fL   MCH 31.8 26.0 - 34.0 pg   MCHC 34.1 32.0 - 36.0 g/dL   RDW 14.0 11.5 - 14.5 %   Platelets 179 150 - 440 K/uL    Comment: Performed at [facility], [address] [place], [place] [address]  Urine Drug Screen, Qualitative     Status: Abnormal   Collection Time: 09/25/17  3:55 AM  Result Value Ref Range   Tricyclic, Ur Screen NONE DETECTED NONE DETECTED   Amphetamines, Ur Screen NONE DETECTED NONE DETECTED   MDMA (Ecstasy)Ur Screen NONE DETECTED NONE DETECTED   Cocaine Metabolite,Ur [place] NONE DETECTED NONE DETECTED   Opiate, Ur Screen NONE DETECTED NONE DETECTED   Phencyclidine (PCP) Ur S NONE DETECTED NONE DETECTED   Cannabinoid 50 Ng, Ur [place] POSITIVE (A) NONE DETECTED   Barbiturates, Ur Screen NONE DETECTED NONE DETECTED   Benzodiazepine, Ur Scrn NONE DETECTED NONE DETECTED   Methadone Scn, Ur NONE DETECTED NONE DETECTED    Comment: (NOTE) Tricyclics + metabolites, urine    Cutoff 1000 ng/mL Amphetamines + metabolites, urine  Cutoff 1000 ng/mL MDMA (Ecstasy), urine              Cutoff 500 ng/mL Cocaine Metabolite, urine          Cutoff 300 ng/mL Opiate + metabolites, urine        Cutoff 300 ng/mL Phencyclidine (PCP), urine  Cutoff 25 ng/mL Cannabinoid, urine                 Cutoff 50  ng/mL Barbiturates + metabolites, urine  Cutoff 200 ng/mL Benzodiazepine, urine              Cutoff 200 ng/mL Methadone, urine                   Cutoff 300 ng/mL The urine drug screen provides only a preliminary, unconfirmed analytical test result and should not be used for non-medical purposes. Clinical consideration and professional judgment should be applied to any positive drug screen result due to possible interfering substances. A more specific alternate chemical method must be used in order to obtain a confirmed analytical result. Gas chromatography / mass spectrometry (GC/MS) is the preferred confirmat ory method. Performed at [facility], [address] [place], [place] [address]   Lithium level     Status: Abnormal   Collection Time: 09/25/17  4:34 AM  Result Value Ref Range   Lithium Lvl 0.46 (L) 0.60 - 1.20 mmol/L    Comment: Performed at [facility], [address] [place], [place] [address]    Current Facility-Administered Medications  Medication Dose Route Frequency Provider Last Rate Last Dose  . amantadine (SYMMETREL) capsule 100 mg  100 mg Oral BID [name], [name], MD      . fluPHENAZine (PROLIXIN) tablet 10 mg  10 mg Oral BID [name], [name], MD      . lithium carbonate (ESKALITH) CR tablet 450 mg  450 mg Oral Q12H [name], [name], MD      . propranolol (INDERAL) tablet 10 mg  10 mg Oral TID [name], [name], MD       Current Outpatient Medications  Medication Sig Dispense Refill  . amantadine (SYMMETREL) 100 MG capsule Take 1 capsule (100 mg total) by mouth 2 (two) times daily. 60 capsule 0  . benztropine (COGENTIN) 1 MG tablet Take 1 mg by mouth daily.    . fluPHENAZine (PROLIXIN) 10 MG tablet Take 1 tablet (10 mg total) by mouth 2 (two) times daily before a meal. 60 tablet 0  . hydrOXYzine (ATARAX/VISTARIL) 25 MG tablet Take 1 tablet (25 mg total) by mouth 3 (three) times daily as needed for itching. 15 tablet 0  . lithium carbonate  (ESKALITH) 450 MG CR tablet Take 450 mg by mouth 2 (two) times daily.    . Paliperidone Palmitate (INVEGA TRINZA) 819 MG/2.625ML SUSP Inject 819 mg into the muscle.    Marland Kitchen amoxicillin (AMOXIL) 500 MG capsule Take 1 capsule (500 mg total) by mouth every 8 (eight) hours. (Patient not taking: Reported on 09/25/2017) 21 capsule 0  . clonazePAM (KLONOPIN) 0.5 MG tablet Take 1 tablet (0.5 mg total) by mouth at bedtime. (Patient not taking: Reported on 09/25/2017) 30 tablet 0  . lithium carbonate (LITHOBID) 300 MG CR tablet Take 2 tablets (600 mg total) by mouth every 12 (twelve) hours. (Patient not taking: Reported on 09/25/2017) 120 tablet 1  . propranolol (INDERAL) 10 MG tablet Take 1 tablet (10 mg total) by mouth 3 (three) times daily. 90 tablet 1    Musculoskeletal: Strength & Muscle Tone: within normal limits Gait & Station: normal Patient leans: N/A  Psychiatric Specialty Exam: Physical Exam  Nursing note and vitals reviewed. Constitutional: He appears well-developed and well-nourished.  HENT:  Head: Normocephalic and atraumatic.  Eyes: Pupils are equal, round, and reactive to light. Conjunctivae are normal.  Neck: Normal range of motion.  Cardiovascular: Regular rhythm and normal heart sounds.  Respiratory: Effort normal. No respiratory distress.  GI: Soft.  Musculoskeletal: Normal range of motion.  Neurological: He is alert.  Skin: Skin is warm and dry.  Psychiatric: His affect is blunt. His speech is delayed. He is slowed. Thought content is paranoid. Cognition and memory are impaired. He expresses impulsivity. He expresses no suicidal ideation. He exhibits normal recent memory.    Review of Systems  Constitutional: Negative.   HENT: Negative.   Eyes: Negative.   Respiratory: Negative.   Cardiovascular: Negative.   Gastrointestinal: Negative.   Musculoskeletal: Negative.   Skin: Negative.   Neurological: Negative.   Psychiatric/Behavioral: Positive for hallucinations and  substance abuse. Negative for depression, memory loss and suicidal ideas. The patient is nervous/anxious. The patient does not have insomnia.     Blood pressure 121/73, pulse 70, temperature (!) 97.5 F (36.4 C), temperature source Oral, resp. rate 18, height 6' (1.829 m), weight 89.8 kg (198 lb), SpO2 100 %.Body mass index is 26.85 kg/m.  General Appearance: Fairly Groomed  Eye Contact:  Good  Speech:  Slow  Volume:  Decreased  Mood:  Euthymic  Affect:  Flat  Thought Process:  Coherent  Orientation:  Full (Time, Place, and Person)  Thought Content:  Hallucinations: Auditory Visual  Suicidal Thoughts:  No  Homicidal Thoughts:  No  Memory:  Immediate;   Fair Recent;   Fair Remote;   Fair  Judgement:  Fair  Insight:  Fair  Psychomotor Activity:  Decreased  Concentration:  Concentration: Fair  Recall:  [employer] of Knowledge:  Fair  Language:  Fair  Akathisia:  No  Handed:  Right  AIMS (if indicated):     Assets:  Desire for Improvement Housing Physical Health Resilience Social Support  ADL's:  Intact  Cognition:  Impaired,  Mild  Sleep:        Treatment Plan Summary: Medication management and Plan This is a 32 year old man with a history of chronic mental illness brought in by law enforcement voluntarily because he was acting psychotic in public.  Patient admits that he is having his active psychotic symptoms of that he is less than compliant with his medicine.  On the other hand he is not suicidal violent or dangerous in an obvious way.  I recommended to the patient that for the sake of stability and his mental health that it would be wiser to admit him to the hospital.  He is agreeable to this plan.  I have spoken with TTS and emergency room physician.  Patient will be voluntarily admitted.  Continue his lithium and his Prolixin and Symmetrel as previously prescribed.  Not sure how long ago he last had his long-acting injectable so we will defer any action on that.  Full  set of labs and EKG will be done.  Disposition: Recommend psychiatric Inpatient admission when medically cleared. Supportive therapy provided about ongoing stressors.  Alethia Berthold, MD 09/25/2017 12:51 PM

## 2017-09-25 NOTE — BH Assessment (Signed)
Assessment Note  Anthony BarthelBrandon Wayne Luna is an 32 y.o. male.in to ED via BPD by voluntary admission. Pt reportedly was at [employer] talking to himself when store manager called [employer]. Pt reports that he has been hearing voices and becoming increasingly irritated. Pt reports, "I think about dying. I've thought about it for a year, since I got my own place." Pt reports to having hx of schizophrenia but feels his AH/ VH are getting worse, and feels his current medicines are not working. Pt reports to currently receiving therapy/ med mgmt services at [facility] and reports to having [facility].  At time of assessment, pt was calm and cooperative. Pt appeared to be reacting to some internal stimuli as evidenced by his delayed response to certain questions and eye glare for seconds at a time. Pt stated that he "sees the people who talk in his head." When writer asked what the people say pt responded, "Like now they are telling me to be quiet and listen to you. Earlier at the store they told me to ignore the people around me." Pt reports no plan despite SI. No HI noted. Pt also reports to smoking marijuana yesterday. When asked if the marijuana made him feel worse or better, pt responded: "It makes me feel sluggish."  Diagnosis: Schizophrenia  Past Medical History:  Past Medical History:  Diagnosis Date  . None to low serum cortisol response with adrenocorticotrophic hormone (ACTH) stimulation test   . Schizophrenia, paranoid type (HCC)     History reviewed. No pertinent surgical history.  Family History: No family history on file.  Social History:  reports that he has been smoking cigarettes.  He started smoking about 14 years ago. He has been smoking about 0.50 packs per day. He has quit using smokeless tobacco. He reports that he drinks alcohol. He reports that he has current or past drug history. Drug: Marijuana.  Additional Social History:  Alcohol /  Drug Use Pain Medications: see PTA Prescriptions: see PTA Over the Counter: see PTA History of alcohol / drug use?: Yes Longest period of sobriety (when/how long): unknown Substance #1 Name of Substance 1: marijuana 1 - Age of First Use: 17 1 - Amount (size/oz): varies 1 - Frequency: infrequent 1 - Duration: varies 1 - Last Use / Amount: yesterday  CIWA: CIWA-Ar BP: 121/73 Pulse Rate: 70 COWS:    Allergies: No Known Allergies  Home Medications:  (Not in a hospital admission)  OB/GYN Status:  No LMP for male patient.  General Assessment Data [facility] Assessment: In system Is this a Tele or Face-to-Face Assessment?: Face-to-Face Is this an Initial Assessment or a Re-assessment for this encounter?: Initial Assessment Marital status: Single Maiden name: N/A Is patient pregnant?: No Pregnancy Status: No Living Arrangements: Alone Can pt return to current living arrangement?: Yes Admission Status: Voluntary Is patient capable of signing voluntary admission?: Yes Referral Source: Self/Family/Friend Insurance type: Medicaid  Medical Screening Exam [facility] Walk-in ONLY) Medical Exam completed: Yes  Crisis Care Plan Living Arrangements: Alone Legal Guardian: (Pt reports he is his own legal guardian) Name of Psychiatrist: [facility] Name of Therapist: [facility]  Education Status Is patient currently in school?: No Is the patient employed, unemployed or receiving disability?: Receiving disability income, Unemployed  Risk to self with the past 6 months Suicidal Ideation: Yes-Currently Present Has patient been a risk to self within the past 6 months prior to admission? : Yes Suicidal Intent:  No Has patient had any suicidal intent within the past 6 months prior to admission? : No Is patient at risk for suicide?: Yes Suicidal Plan?: No Has patient had any suicidal plan within the past 6 months prior to admission? : No Access to  Means: Yes Specify Access to Suicidal Means: Pt reports to having "knives" at home What has been your use of drugs/alcohol within the last 12 months?: Marijuan Previous Attempts/Gestures: No How many times?: 0 Other Self Harm Risks: None indicated Triggers for Past Attempts: Hallucinations(Pt reports AH and VH) Intentional Self Injurious Behavior: None Family Suicide History: No Recent stressful life event(s): Other (Comment)(Pt reports to having AH/ VH, possibly induced by marijuana) Persecutory voices/beliefs?: Yes Depression: Yes Depression Symptoms: Despondent, Tearfulness, Isolating, Feeling worthless/self pity, Feeling angry/irritable Substance abuse history and/or treatment for substance abuse?: Yes Suicide prevention information given to non-admitted patients: Yes  Risk to Others within the past 6 months Homicidal Ideation: No Does patient have any lifetime risk of violence toward others beyond the six months prior to admission? : Unknown Thoughts of Harm to Others: No Current Homicidal Intent: No Current Homicidal Plan: No Access to Homicidal Means: No Identified Victim: N/A History of harm to others?: No Assessment of Violence: None Noted Violent Behavior Description: None indicated Does patient have access to weapons?: No Criminal Charges Pending?: No Does patient have a court date: No Is patient on probation?: No  Psychosis Hallucinations: Auditory, Visual Delusions: None noted  Mental Status Report Appearance/Hygiene: Unremarkable, In scrubs Eye Contact: Good Motor Activity: Psychomotor retardation(slow movements) Speech: Soft Level of Consciousness: Alert Mood: Depressed, Helpless, Preoccupied, Sad, Pleasant Affect: Depressed Anxiety Level: Minimal Thought Processes: Relevant Judgement: Partial Orientation: Appropriate for developmental age Obsessive Compulsive Thoughts/Behaviors: None  Cognitive Functioning Concentration: Fair Memory: Recent  Intact Is patient IDD: (Possible IDD, pt slow to respond to Q's) Is patient DD?: Unknown Insight: Fair Impulse Control: Good Appetite: Good Have you had any weight changes? : No Change Sleep: No Change Total Hours of Sleep: 8 Vegetative Symptoms: None  ADLScreening [facility]) Patient's cognitive ability adequate to safely complete daily activities?: Yes Patient able to express need for assistance with ADLs?: Yes Independently performs ADLs?: Yes (appropriate for developmental age)  Prior Inpatient Therapy Prior Inpatient Therapy: Yes Prior Therapy Dates: 2018, 2016 Prior Therapy Facilty/Provider(s): [facility] Reason for Treatment: depression, schizophrenia  Prior Outpatient Therapy Prior Outpatient Therapy: Yes Prior Therapy Dates: 2018-2019 Prior Therapy Facilty/Provider(s): [facility] Reason for Treatment: Depression, schizophrenia Does patient have an ACCT team?: Yes(Pt reports he has ACCT team through [facility]) Does patient have [facility] services? : Unknown Does patient have P4CC services?: Unknown  ADL Screening (condition at time of admission) Patient's cognitive ability adequate to safely complete daily activities?: Yes Is the patient deaf or have difficulty hearing?: No Does the patient have difficulty seeing, even when wearing glasses/contacts?: No Does the patient have difficulty concentrating, remembering, or making decisions?: Yes Patient able to express need for assistance with ADLs?: Yes Does the patient have difficulty dressing or bathing?: No Independently performs ADLs?: Yes (appropriate for developmental age) Does the patient have difficulty walking or climbing stairs?: No Weakness of Legs: None Weakness of Arms/Hands: None  Home Assistive Devices/Equipment Home Assistive Devices/Equipment: None  Therapy Consults (therapy consults require a physician order) PT Evaluation Needed: No OT Evalulation Needed: No SLP Evaluation  Needed: No Abuse/Neglect Assessment (Assessment to be complete while patient is alone) Abuse/Neglect Assessment Can Be Completed: Yes Physical Abuse: Denies Verbal Abuse: Denies  Sexual Abuse: Denies Exploitation of patient/patient's resources: Denies Self-Neglect: Denies Values / Beliefs Cultural Requests During Hospitalization: None Consults Spiritual Care Consult Needed: No Social Work Consult Needed: No      Additional Information 1:1 In Past 12 Months?: No CIRT Risk: No Elopement Risk: No Does patient have medical clearance?: Yes     Disposition:  Disposition Initial Assessment Completed for this Encounter: Yes Disposition of Patient: (Pending Psych consult) Patient refused recommended treatment: No Mode of transportation if patient is discharged?: Other(Transportation assistance) Patient referred to: (Pending psych consult)  On Site Evaluation by:   Reviewed with Physician:    Aubery Lapping, MS, [facility] 09/25/2017 6:13 AM
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
## 2017-09-25 NOTE — Tx Team (Signed)
Initial Treatment Plan 09/25/2017 6:00 PM [facility] RUE:[id number]    PATIENT STRESSORS: Educational concerns Health problems Substance abuse   PATIENT STRENGTHS: Ability for insight Average or above average intelligence Capable of independent living Barrister's clerkCommunication skills Motivation for treatment/growth Religious Affiliation Supportive family/friends   PATIENT IDENTIFIED PROBLEMS: Substance abuse  Medication compliance                   DISCHARGE CRITERIA:  Ability to meet basic life and health needs Improved stabilization in mood, thinking, and/or behavior  PRELIMINARY DISCHARGE PLAN: Attend aftercare/continuing care group Return to previous living arrangement Return to previous work or school arrangements  PATIENT/FAMILY INVOLVEMENT: This treatment plan has been presented to and reviewed with the patient, [name], and/or family member.  The patient and family have been given the opportunity to ask questions and make suggestions.  Dewayne ShorterAlyssa  Jasmin Trumbull, RN 09/25/2017, 6:00 PM

## 2017-09-25 NOTE — ED Provider Notes (Signed)
[facility] Emergency Department Provider Note   ____________________________________________   First MD Initiated Contact with Patient 09/25/17 [phone]     (approximate)  I have reviewed the triage vital signs and the nursing notes.   HISTORY  Chief Complaint Psychiatric Evaluation    HPI Anthony Luna is a 32 y.o. male with a history of schizoaffective disorder and cannabis use disorder who presents voluntarily via BPD for mental health evaluation.  Patient reports waking up to take his medications and he began to hear voices.  Voices were telling him to harm himself.  Voices no medical complaints.  Specifically, denies recent fever, chills, chest pain, shortness of breath, abdominal pain, nausea, vomiting, diarrhea.  Denies recent travel or trauma.   Past Medical History:  Diagnosis Date  . None to low serum cortisol response with adrenocorticotrophic hormone (ACTH) stimulation test   . Schizophrenia, paranoid type [facility])     Patient Active Problem List   Diagnosis Date Noted  . Cannabis use disorder, severe, dependence (HCC) 11/30/2014  . Schizoaffective disorder, bipolar type (HCC) 11/30/2014  . Tobacco use disorder 11/30/2014    History reviewed. No pertinent surgical history.  Prior to Admission medications   Medication Sig Start Date End Date Taking? Authorizing Provider  amantadine (SYMMETREL) 100 MG capsule Take 1 capsule (100 mg total) by mouth 2 (two) times daily. 12/08/14   Jimmy Footman, MD  amoxicillin (AMOXIL) 500 MG capsule Take 1 capsule (500 mg total) by mouth every 8 (eight) hours. 04/13/17   Pucilowska, Braulio Conte B, MD  clonazePAM (KLONOPIN) 0.5 MG tablet Take 1 tablet (0.5 mg total) by mouth at bedtime. 12/22/14   Jimmy Footman, MD  fluPHENAZine (PROLIXIN) 10 MG tablet Take 1 tablet (10 mg total) by mouth 2 (two) times daily before a meal. 12/22/14   Jimmy Footman, MD  hydrOXYzine  (ATARAX/VISTARIL) 25 MG tablet Take 1 tablet (25 mg total) by mouth 3 (three) times daily as needed for itching. 06/04/15   Rebecka Apley, MD  lithium carbonate (LITHOBID) 300 MG CR tablet Take 2 tablets (600 mg total) by mouth every 12 (twelve) hours. 04/13/17   Pucilowska, Braulio Conte B, MD  propranolol (INDERAL) 10 MG tablet Take 1 tablet (10 mg total) by mouth 3 (three) times daily. 04/13/17   Pucilowska, Ellin Goodie, MD    Allergies Patient has no known allergies.  No family history on file.  Social History Social History   Tobacco Use  . Smoking status: Current Every Day Smoker    Packs/day: 0.50    Types: Cigarettes    Start date: 11/29/2002  . Smokeless tobacco: Former Engineer, water Use Topics  . Alcohol use: Yes    Alcohol/week: 0.0 oz  . Drug use: Yes    Types: Marijuana    Review of Systems  Constitutional: No fever/chills. Eyes: No visual changes. ENT: No sore throat. Cardiovascular: Denies chest pain. Respiratory: Denies shortness of breath. Gastrointestinal: No abdominal pain.  No nausea, no vomiting.  No diarrhea.  No constipation. Genitourinary: Negative for dysuria. Musculoskeletal: Negative for back pain. Skin: Negative for rash. Neurological: Negative for headaches, focal weakness or numbness. Psychiatric:Positive for auditory hallucinations.   ____________________________________________   PHYSICAL EXAM:  VITAL SIGNS: ED Triage Vitals  Enc Vitals Group     BP 09/25/17 0343 121/73     Pulse Rate 09/25/17 0343 70     Resp 09/25/17 0343 18     Temp 09/25/17 0343 (!) 97.5 F (36.4 C)  Temp Source 09/25/17 0343 Oral     SpO2 09/25/17 0343 100 %     Weight 09/25/17 0345 198 lb (89.8 kg)     Height 09/25/17 0345 6' (1.829 m)     Head Circumference --      Peak Flow --      Pain Score 09/25/17 0345 0     Pain Loc --      Pain Edu? --      Excl. in GC? --     Constitutional: Alert and oriented. Well appearing and in no acute  distress. Eyes: Conjunctivae are normal. PERRL. EOMI. Head: Atraumatic. Nose: No congestion/rhinnorhea. Mouth/Throat: Mucous membranes are moist.  Oropharynx non-erythematous. Neck: No stridor.   Cardiovascular: Normal rate, regular rhythm. Grossly normal heart sounds.  Good peripheral circulation. Respiratory: Normal respiratory effort.  No retractions. Lungs CTAB. Gastrointestinal: Soft and nontender. No distention. No abdominal bruits. No CVA tenderness. Musculoskeletal: No lower extremity tenderness nor edema.  No joint effusions. Neurologic:  Normal speech and language. No gross focal neurologic deficits are appreciated. No gait instability. Skin:  Skin is warm, dry and intact. No rash noted. Psychiatric: Mood and affect are flat. Speech and behavior are normal.  ____________________________________________   LABS (all labs ordered are listed, but only abnormal results are displayed)  Labs Reviewed  COMPREHENSIVE METABOLIC PANEL - Abnormal; Notable for the following components:      Result Value   Glucose, Bld 103 (*)    Anion gap 4 (*)    All other components within normal limits  ACETAMINOPHEN LEVEL - Abnormal; Notable for the following components:   Acetaminophen (Tylenol), Serum <10 (*)    All other components within normal limits  URINE DRUG SCREEN, QUALITATIVE ([facility] ONLY) - Abnormal; Notable for the following components:   Cannabinoid 50 Ng, Ur Joseph POSITIVE (*)    All other components within normal limits  LITHIUM LEVEL - Abnormal; Notable for the following components:   Lithium Lvl 0.46 (*)    All other components within normal limits  ETHANOL  SALICYLATE LEVEL  CBC   ____________________________________________  EKG  None ____________________________________________  RADIOLOGY  ED MD interpretation: None  Official radiology report(s): No results found.  ____________________________________________   PROCEDURES  Procedure(s) performed:  None  Procedures  Critical Care performed: No  ____________________________________________   INITIAL IMPRESSION / ASSESSMENT AND PLAN / ED COURSE  As part of my medical decision making, I reviewed the following data within the electronic MEDICAL RECORD NUMBER Nursing notes reviewed and incorporated, Labs reviewed, Old chart reviewed, A consult was requested and obtained from this/these consultant(s) Psychiatry and Notes from prior ED visits   32 year old male with schizoaffective disorder who presents with auditory hallucinations.  Reportedly takes lithium; will check level.  Held off reordering home medications currently as they are not verified.  Patient contracts for safety while in the ED.  Will remain voluntary in the ED pending TTS and [facility] psychiatry evaluations.   Clinical Course as of Sep 26 714  Fri Sep 25, 2017  0714 No further events.  Noted subtherapeutic lithium level and positive cannabinoids in urine.  Patient remains in the ED voluntarily pending [facility] psychiatry evaluation.  Disposition per psychiatry.   [JS]    Clinical Course User Index [JS] Irean Hong, MD     ____________________________________________   FINAL CLINICAL IMPRESSION(S) / ED DIAGNOSES  Final diagnoses:  Schizophrenia, unspecified type [facility])  Marijuana abuse     ED Discharge Orders    None  Note:  This document was prepared using Dragon voice recognition software and may include unintentional dictation errors.    Irean HongSung, Yarelie Hams J, MD 09/25/17 [phone]

## 2017-09-25 NOTE — ED Notes (Signed)
Pt given breakfast tray. Pt is comfortable and seems content.

## 2017-09-26 DIAGNOSIS — F259 Schizoaffective disorder, unspecified: Secondary | ICD-10-CM

## 2017-09-26 LAB — LIPID PANEL
Cholesterol: 153 mg/dL (ref 0–200)
HDL: 55 mg/dL (ref >40)
LDL CALC: 85 mg/dL (ref 0–99)
Total CHOL/HDL Ratio: 2.8 RATIO
Triglycerides: 64 mg/dL (ref <150)
VLDL: 13 mg/dL (ref 0–40)

## 2017-09-26 LAB — TSH: TSH: 0.955 u[IU]/mL (ref 0.350–4.500)

## 2017-09-26 LAB — HEMOGLOBIN A1C
Hgb A1c MFr Bld: 4.7 % — ABNORMAL LOW (ref 4.8–5.6)
MEAN PLASMA GLUCOSE: 88.19 mg/dL

## 2017-09-26 MED ORDER — NICOTINE 21 MG/24HR TD PT24
21.0000 mg | MEDICATED_PATCH | Freq: Every day | TRANSDERMAL | Status: DC
  Administered 2017-09-26 – 2017-10-09 (×11): 21 mg via TRANSDERMAL
  Filled 2017-09-26 (×14): qty 1

## 2017-09-26 NOTE — Plan of Care (Addendum)
Patient found standing in his room in the dark responding to internal stimuli. Patient is visible and social throughout the rest of the evening. Encouraged patient to join the milieu. No responding observed when patient is with peers. Patient denies depression, anxiety, and SI/HI/AVH. Affect is animated and silly. Denies pain. Reports eating and voiding adequately. Given Trazodone for sleep with positive results. Compliant with HS medication and staff direction. Q 15 minute checks maintained. Will continue to monitor throughout the shift. Patient slept 5 hours. No apparent distress. Will endorse care to oncoming shift.  Problem: Self-Concept: Goal: Level of anxiety will decrease Outcome: Progressing   Problem: Coping: Goal: Level of anxiety will decrease Outcome: Progressing

## 2017-09-26 NOTE — Plan of Care (Signed)
Patient alert and oriented to self and place. Pleasant and cooperative, denies SI.HI and AVH but is often observed responding to internal stimuli. Compliant with unit policies and is observed interacting with peers. Support and encouragement provided to patient, milieu remains safe with q 15 minute safety checks.

## 2017-09-26 NOTE — [facility] Suicide Risk Assessment (Signed)
[facility] Admission Suicide Risk Assessment   Nursing information obtained from:  Patient Demographic factors:  Luna, Anthony Luna, Anthony alone Current Mental Status:  NA Loss Factors:  Financial problems / change in socioeconomic status Historical Factors:  Family history of mental illness or substance abuse Risk Reduction Factors:  NA  Total Time spent with patient: 1 hour Principal Problem: <principal problem not specified> Diagnosis:   Patient Active Problem List   Diagnosis Date Noted  . Schizoaffective disorder (HCC) [F25.9] 09/25/2017  . Cannabis use disorder, severe, dependence (HCC) [F12.20] 11/30/2014  . Schizoaffective disorder, bipolar type (HCC) [F25.0] 11/30/2014  . Tobacco use disorder [F17.200] 11/30/2014   Subjective Data: " I was hearing voices" Pt is 32 year old man with chronic mental illness, schizoaffective d/o, schizophrenia admitted from ED voluntarily. Per report, pt  was brought to ED  because he was at a local gas station pacing around talking to himself acting strange.  Patient reports hearing voices are gotten worse lately, has been anxious and depressed. , Reports voices commenting on his actions like " he is breathing " .  pt denies suicidal or homicidal thoughts. Pt admits that he is not fully compliant with his medicine all the time , skips meds here and there ,  he has an act team that visits him regularly. Continues to smoke marijuana - states he smokes about 2-3 x/week. UDS positive for THC.     Continued Clinical Symptoms:  Alcohol Use Disorder Identification Test Final Score (AUDIT): 4 The "Alcohol Use Disorders Identification Test", Guidelines for Use in Primary Care, Second Edition.  World Science writer [facility]). Score between 0-7:  no or low risk or alcohol related problems. Score between 8-15:  moderate risk of alcohol related problems. Score between 16-19:  high risk of alcohol related problems. Score 20 or above:  warrants  further diagnostic evaluation for alcohol dependence and treatment.   CLINICAL FACTORS:   Currently Psychotic Previous Psychiatric Diagnoses and Treatments   Musculoskeletal: Strength & Muscle Tone: within normal limits Gait & Station: normal Patient leans:   Psychiatric Specialty Exam: Physical Exam  Nursing note and vitals reviewed.   ROS  Blood pressure (!) 136/126, pulse (!) 107, temperature 98.3 F (36.8 C), temperature source Oral, resp. rate 16, height 6' (1.829 m), weight 85.7 kg (189 lb), SpO2 98 %.Body mass index is 25.63 kg/m.  General Appearance: Disheveled  Eye Contact:  Poor  Speech:  Slow  Volume:  Normal  Mood:  Anxious  Affect:  Constricted  Thought Process:  tangential  Orientation:  ox3  Thought Content:  Illogical  Suicidal Thoughts:  No  Homicidal Thoughts:  No  Memory:  intact  Judgement:  Fair  Insight:  Fair  Psychomotor Activity:  Normal  Concentration:  fair  Recall:  Good  Fund of Knowledge:  Good  Language:  Good  Akathisia:  No  Handed:    AIMS (if indicated):     Assets:  Communication Skills Desire for Improvement  ADL's:  Intact  Cognition:  WNL  Sleep:  Number of Hours: 5          COGNITIVE FEATURES THAT CONTRIBUTE TO RISK:  Limited insight    SUICIDE RISK:   mild  PLAN OF CARE:  Daily contact with patient to assess and evaluate symptoms and progress in treatment and Medication management.  Pt with schizoaffective disorder admitted for psychotic decompensation.  Pt admitted voluntarily.  Continue his lithium and his Prolixin . QTc-410. Li level- 0.46  but pt admits missing dosage of medicines. TSH wnl.  Pt on invega sustenna- states he got last shot on 09/16/17, dose unsure, will get collateral from ACT team.     Observation Level/Precautions:  15 minute checks  Laboratory:  CBC Chemistry Profile UDS UA, TSH, lipid panel, EKG  Psychotherapy:    Medications:    Consultations:    Discharge Concerns:     Estimated LOS:  Other:     Physician Treatment Plan for Primary Diagnosis: <principal problem not specified> Long Term Goal(s): Improvement in symptoms so as ready for discharge  Short Term Goals: Ability to identify changes in lifestyle to reduce recurrence of condition will improve, Ability to verbalize feelings will improve, Ability to disclose and discuss suicidal ideas, Ability to demonstrate self-control will improve, Ability to identify and develop effective coping behaviors will improve, Ability to maintain clinical measurements within normal limits will improve, Compliance with prescribed medications will improve and Ability to identify triggers associated with substance abuse/mental health issues will improve  Physician Treatment Plan for Secondary Diagnosis: Active Problems:   Schizoaffective disorder (HCC)  Long Term Goal(s): Improvement in symptoms so as ready for discharge  Short Term Goals: Ability to identify changes in lifestyle to reduce recurrence of condition will improve, Ability to verbalize feelings will improve, Ability to disclose and discuss suicidal ideas, Ability to demonstrate self-control will improve, Ability to identify and develop effective coping behaviors will improve, Ability to maintain clinical measurements within normal limits will improve, Compliance with prescribed medications will improve and Ability to identify triggers associated with substance abuse/mental health issues will improve     I certify that inpatient services furnished can reasonably be expected to improve the patient's condition.   Beverly SessionsJagannath Melinna Linarez, MD 09/26/2017, 12:14 PM

## 2017-09-26 NOTE — Plan of Care (Addendum)
Patient found in day room playing cards with peers upon my arrival. Patient is visible and social throughout the evening. Patient mood and affect are brighter, patient seems less preoccupied. Denies SI/HI/AVH but observed responding to internal stimuli in his room when alone. Denies depression and anxiety. Patient is smiling, polite, and cooperative throughout the evening. Denies pain. Eating and voiding adequately. Compliant with HS medications and staff direction. Given Trazodone for sleep with positive results. Q 15 minute checks maintained. Will continue to monitor throughout the shift. Patient slept 5.5 hours restlessly. Will endorse care to oncoming shift.  Problem: Coping: Goal: Coping ability will improve Outcome: Progressing Goal: Will verbalize feelings Outcome: Progressing   Problem: Self-Concept: Goal: Level of anxiety will decrease Outcome: Progressing   Problem: Safety: Goal: Ability to remain free from injury will improve Outcome: Progressing

## 2017-09-26 NOTE — H&P (Signed)
Psychiatric Admission Assessment Adult  Patient Identification: Anthony Luna MRN:  [id number] Date of Evaluation:  09/26/2017 Chief Complaint:  Schizophrenia Principal Diagnosis: <principal problem not specified> Diagnosis:   Patient Active Problem List   Diagnosis Date Noted  . Schizoaffective disorder (HCC) [F25.9] 09/25/2017  . Cannabis use disorder, severe, dependence (HCC) [F12.20] 11/30/2014  . Schizoaffective disorder, bipolar type (HCC) [F25.0] 11/30/2014  . Tobacco use disorder [F17.200] 11/30/2014   History of Present Illness: " I was hearing voices" Pt is 32 year old man with chronic mental illness, schizoaffective d/o, schizophrenia admitted from ED voluntarily. Per report, pt  was brought to ED  because he was at a local gas station pacing around talking to himself acting strange.   Patient reports hearing voices are gotten worse lately, has been anxious and depressed. , Reports voices commenting on his actions like " he is breathing " .   pt denies suicidal or homicidal thoughts.  Pt admits that he is not fully compliant with his medicine all the time , skips meds here and there ,  he has an act team that visits him regularly.  Continues to smoke marijuana - states he smokes about 2-3 x/week.  UDS positive for THC.   Associated Signs/Symptoms: Depression Symptoms:  depressed mood, anxiety, (Hypo) Manic Symptoms:  Hallucinations, Anxiety Symptoms:  anxiety Psychotic Symptoms:  AH PTSD Symptoms: Negative Total Time spent with patient: 1 hour  Past Psychiatric History:  H/o schizoaffective disorder, paranoid schizophrenia.  h/o Multiple hospitalizations over the last few years, last hospitalization-  in October 2018.  No history of suicide attempts.   He is on invega sutenna ,  as well as lithium and oral antipsychotics.    Is the patient at risk to self? No.  Has the patient been a risk to self in the past 6 months? No.  Has the patient been a risk to self  within the distant past? No.  Is the patient a risk to others? No.  Has the patient been a risk to others in the past 6 months? No.  Has the patient been a risk to others within the distant past? No.   Prior Inpatient Therapy:   Prior Outpatient Therapy:    Alcohol Screening: 1. How often do you have a drink containing alcohol?: 2 to 4 times a month 2. How many drinks containing alcohol do you have on a typical day when you are drinking?: 3 or 4 3. How often do you have six or more drinks on one occasion?: Never AUDIT-C Score: 3 4. How often during the last year have you found that you were not able to stop drinking once you had started?: Never 5. How often during the last year have you failed to do what was normally expected from you becasue of drinking?: Never 6. How often during the last year have you needed a first drink in the morning to get yourself going after a heavy drinking session?: Never 7. How often during the last year have you had a feeling of guilt of remorse after drinking?: Less than monthly 8. How often during the last year have you been unable to remember what happened the night before because you had been drinking?: Never 9. Have you or someone else been injured as a result of your drinking?: No 10. Has a relative or friend or a doctor or another health worker been concerned about your drinking or suggested you cut down?: No Alcohol Use Disorder Identification Test Final Score (AUDIT): 4  Intervention/Follow-up: Brief Advice Substance Abuse History in the last 12 months:  Yes.    Continues to smoke marijuana - states he smokes about 2-3 x/week.    Consequences of Substance Abuse:  Previous Psychotropic Medications: see above Psychological Evaluations:  Past Medical History:  Past Medical History:  Diagnosis Date  . Depression    Since moving into new apartment 1 year ago  . None to low serum cortisol response with adrenocorticotrophic hormone (ACTH) stimulation  test   . Schizophrenia, paranoid type (HCC)    History reviewed. No pertinent surgical history. Family History: History reviewed. No pertinent family history. Family Psychiatric  History: unknown Tobacco Screening: Have you used any form of tobacco in the last 30 days? (Cigarettes, Smokeless Tobacco, Cigars, and/or Pipes): Yes Tobacco use, Select all that apply: 5 or more cigarettes per day(0.5 pack/day) Are you interested in Tobacco Cessation Medications?: Yes, will notify MD for an order(Patch) Counseled patient on smoking cessation including recognizing danger situations, developing coping skills and basic information about quitting provided: Yes Social History:  Social History   Substance and Sexual Activity  Alcohol Use Yes  . Alcohol/week: 0.0 oz   Comment: "Special occasions"     Social History   Substance and Sexual Activity  Drug Use Yes  . Frequency: 4.0 times per week  . Types: Marijuana   Comment: "2 blunts"    Additional Social History:  pt living independently but has an act team who check on him a couple times a week.       Pain Medications: See PTA Prescriptions: See PTA Over the Counter: See PTA History of alcohol / drug use?: Yes Negative Consequences of Use: Financial, Personal relationships, Work / School Name of Substance 1: Alcohol 1 - Amount (size/oz): 2-3 Drinks 1 - Frequency: "Special occasions" 1 - Last Use / Amount: Last night (4/11)                  Allergies:  No Known Allergies Lab Results:  Results for orders placed or performed during the hospital encounter of 09/25/17 (from the past 48 hour(s))  Lipid panel     Status: None   Collection Time: 09/26/17  7:17 AM  Result Value Ref Range   Cholesterol 153 0 - 200 mg/dL   Triglycerides 64 <409 mg/dL   HDL 55 >81 mg/dL   Total CHOL/HDL Ratio 2.8 RATIO   VLDL 13 0 - 40 mg/dL   LDL Cholesterol 85 0 - 99 mg/dL    Comment:        Total Cholesterol/HDL:CHD Risk Coronary Heart Disease  Risk Table                     Men   Women  1/2 Average Risk   3.4   3.3  Average Risk       5.0   4.4  2 X Average Risk   9.6   7.1  3 X Average Risk  23.4   11.0        Use the calculated Patient Ratio above and the CHD Risk Table to determine the patient's CHD Risk.        ATP III CLASSIFICATION (LDL):  <100     mg/dL   Optimal  191-478  mg/dL   Near or Above                    Optimal  130-159  mg/dL   Borderline  295-621  mg/dL  High  >190     mg/dL   Very High Performed at [facility], [address] [place], [place] [address]   TSH     Status: None   Collection Time: 09/26/17  7:17 AM  Result Value Ref Range   TSH 0.955 0.350 - 4.500 uIU/mL    Comment: Performed by a 3rd Generation assay with a functional sensitivity of <=0.01 uIU/mL. Performed at [facility], [address] [place], [place] [address]     Blood Alcohol level:  Lab Results  Component Value Date   [facility] 09/[facility]2017   ETH <10 04/07/2017    Metabolic Disorder Labs:  Lab Results  Component Value Date   HGBA1C 5.3 04/08/2017   MPG 105.41 04/08/2017   Lab Results  Component Value Date   PROLACTIN 6.1 12/19/2014   PROLACTIN 1.3 (L) 11/30/2014   Lab Results  Component Value Date   CHOL 153 09/26/2017   TRIG 64 09/26/2017   HDL 55 09/26/2017   CHOLHDL 2.8 09/26/2017   VLDL 13 09/26/2017   LDLCALC 85 09/26/2017   LDLCALC 95 04/08/2017    Current Medications: Current Facility-Administered Medications  Medication Dose Route Frequency Provider Last Rate Last Dose  . acetaminophen (TYLENOL) tablet 650 mg  650 mg Oral Q6H PRN [name], [name], MD      . alum & mag hydroxide-simeth (MAALOX/MYLANTA) 200-200-20 MG/5ML suspension 30 mL  30 mL Oral Q4H PRN [name], [name], MD      . amantadine (SYMMETREL) capsule 100 mg  100 mg Oral BID [name], [name], MD   100 mg at 09/26/17 0824  . fluPHENAZine (PROLIXIN) tablet 10 mg  10 mg Oral BID [name], [name], MD   10 mg at 09/26/17  [id number]  . hydrOXYzine (ATARAX/VISTARIL) tablet 50 mg  50 mg Oral TID PRN [name], [name], MD      . lithium carbonate (ESKALITH) CR tablet 450 mg  450 mg Oral Q12H [name], [name], MD   450 mg at 09/26/17 0823  . magnesium hydroxide (MILK OF MAGNESIA) suspension 30 mL  30 mL Oral Daily PRN [name], [name], MD      . propranolol (INDERAL) tablet 10 mg  10 mg Oral TID [name], [name], MD   10 mg at 09/26/17 [id number]  . traZODone (DESYREL) tablet 100 mg  100 mg Oral QHS PRN [name], [name], MD   100 mg at 09/25/17 2117   PTA Medications: Medications Prior to Admission  Medication Sig Dispense Refill Last Dose  . amantadine (SYMMETREL) 100 MG capsule Take 1 capsule (100 mg total) by mouth 2 (two) times daily. 60 capsule 0 09/24/2017 at 1800  . amoxicillin (AMOXIL) 500 MG capsule Take 1 capsule (500 mg total) by mouth every 8 (eight) hours. (Patient not taking: Reported on 09/25/2017) 21 capsule 0 Not Taking at Unknown time  . benztropine (COGENTIN) 1 MG tablet Take 1 mg by mouth daily.   09/24/2017 at 2000  . clonazePAM (KLONOPIN) 0.5 MG tablet Take 1 tablet (0.5 mg total) by mouth at bedtime. (Patient not taking: Reported on 09/25/2017) 30 tablet 0 Not Taking at Unknown time  . fluPHENAZine (PROLIXIN) 10 MG tablet Take 1 tablet (10 mg total) by mouth 2 (two) times daily before a meal. 60 tablet 0 09/24/2017 at 1800  . hydrOXYzine (ATARAX/VISTARIL) 25 MG tablet Take 1 tablet (25 mg total) by mouth 3 (three) times daily as needed for itching. 15 tablet 0 prn at prn  .  lithium carbonate (ESKALITH) 450 MG CR tablet Take 450 mg by mouth 2 (two) times daily.   09/24/2017 at 2000  . lithium carbonate (LITHOBID) 300 MG CR tablet Take 2 tablets (600 mg total) by mouth every 12 (twelve) hours. (Patient not taking: Reported on 09/25/2017) 120 tablet 1 Not Taking at Unknown time  . Paliperidone Palmitate (INVEGA TRINZA) 819 MG/2.625ML SUSP Inject 819 mg into the muscle.   Past Month at Unknown time  . propranolol (INDERAL)  10 MG tablet Take 1 tablet (10 mg total) by mouth 3 (three) times daily. 90 tablet 1 unknown at unknown    Musculoskeletal: Strength & Muscle Tone: within normal limits Gait & Station: normal Patient leans:   Psychiatric Specialty Exam: Physical Exam  Nursing note and vitals reviewed.   ROS  Blood pressure (!) 136/126, pulse (!) 107, temperature 98.3 F (36.8 C), temperature source Oral, resp. rate 16, height 6' (1.829 m), weight 85.7 kg (189 lb), SpO2 98 %.Body mass index is 25.63 kg/m.  General Appearance: Disheveled  Eye Contact:  Poor  Speech:  Slow  Volume:  Normal  Mood:  Anxious  Affect:  Constricted  Thought Process:  tangential  Orientation:  ox3  Thought Content:  Illogical  Suicidal Thoughts:  No  Homicidal Thoughts:  No  Memory:  intact  Judgement:  Fair  Insight:  Fair  Psychomotor Activity:  Normal  Concentration:  fair  Recall:  Good  Fund of Knowledge:  Good  Language:  Good  Akathisia:  No  Handed:    AIMS (if indicated):     Assets:  Communication Skills Desire for Improvement  ADL's:  Intact  Cognition:  WNL  Sleep:  Number of Hours: 5    Treatment Plan Summary: Daily contact with patient to assess and evaluate symptoms and progress in treatment and Medication management.  Pt with schizoaffective disorder admitted for psychotic decompensation.  Pt admitted voluntarily.  Continue his lithium and his Prolixin . QTc-410. Li level- 0.46 but pt admits missing dosage of medicines. TSH wnl.  Pt on invega sustenna- states he got last shot on 09/16/17, dose unsure, will get collateral from ACT team.     Observation Level/Precautions:  15 minute checks  Laboratory:  CBC Chemistry Profile UDS UA, TSH, lipid panel, EKG  Psychotherapy:    Medications:    Consultations:    Discharge Concerns:    Estimated LOS:  Other:     Physician Treatment Plan for Primary Diagnosis: <principal problem not specified> Long Term Goal(s): Improvement in symptoms  so as ready for discharge  Short Term Goals: Ability to identify changes in lifestyle to reduce recurrence of condition will improve, Ability to verbalize feelings will improve, Ability to disclose and discuss suicidal ideas, Ability to demonstrate self-control will improve, Ability to identify and develop effective coping behaviors will improve, Ability to maintain clinical measurements within normal limits will improve, Compliance with prescribed medications will improve and Ability to identify triggers associated with substance abuse/mental health issues will improve  Physician Treatment Plan for Secondary Diagnosis: Active Problems:   Schizoaffective disorder (HCC)  Long Term Goal(s): Improvement in symptoms so as ready for discharge  Short Term Goals: Ability to identify changes in lifestyle to reduce recurrence of condition will improve, Ability to verbalize feelings will improve, Ability to disclose and discuss suicidal ideas, Ability to demonstrate self-control will improve, Ability to identify and develop effective coping behaviors will improve, Ability to maintain clinical measurements within normal limits will improve, Compliance with  prescribed medications will improve and Ability to identify triggers associated with substance abuse/mental health issues will improve  I certify that inpatient services furnished can reasonably be expected to improve the patient's condition.    Beverly Sessions, MD 4/13/201911:47 AM

## 2017-09-26 NOTE — BHH Counselor (Signed)
Adult Comprehensive Assessment  Patient ID: Anthony Luna, male   DOB: 11-Jul-1985, 32 y.o.   MRN: [id number]  Information Source: Information source: Patient  Current Stressors:  Educational / Learning stressors: learning difficulties- special ed classes Employment / Job issues: pt reports he had trouble following instructions Family Relationships: not good Surveyor, quantity / Lack of resources (include bankruptcy): SSI Housing / Lack of housing: own apartment Physical health (include injuries & life threatening diseases): irregular kidney Social relationships: good Substance abuse: THC Bereavement / Loss: younger brother  Living/Environment/Situation:  Living Arrangements: Alone Living conditions (as described by patient or guardian): good How long has patient lived in current situation?: 1 year What is atmosphere in current home: Comfortable  Family History:  Marital status: Single Are you sexually active?: No What is your sexual orientation?: straight Has your sexual activity been affected by drugs, alcohol, medication, or emotional stress?: no Does patient have children?: Yes  Childhood History:  By whom was/is the patient raised?: Mother/father and step-parent Additional childhood history information: Did not know his biological father until later in life.  Description of patient's relationship with caregiver when they were a child: "Good"  Patient's description of current relationship with people who raised him/her: both deceased  How were you disciplined when you got in trouble as a child/adolescent?: spanking Does patient have siblings?: Yes Number of Siblings: 2 Description of patient's current relationship with siblings: 1 sister, 3 brothers- no relationship  Did patient suffer any verbal/emotional/physical/sexual abuse as a child?: Yes Did patient suffer from severe childhood neglect?: No Has patient ever been sexually abused/assaulted/raped as an adolescent or  adult?: No Was the patient ever a victim of a crime or a disaster?: No Witnessed domestic violence?: No Has patient been effected by domestic violence as an adult?: No  Education:  Highest grade of school patient has completed: some college 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[employer])  Employment/Work Situation:   Employment situation: On disability Why is patient on disability: MENTAL HEALTH- [employer] How long has patient been on disability: pt reports that he received disability since he was a child until he went to jail in 2005 and got it back in 2013 when he was released Patient's job has been impacted by current illness: No What is the longest time patient has a held a job?: 7 months Where was the patient employed at that time?: [employer] Has patient ever been in the [employer]?: No Has patient ever served in combat?: No Did You Receive Any Psychiatric Treatment/Services While in Equities trader?: No Are There Guns or Other Weapons in Your Home?: No  Financial Resources:   Surveyor, quantity resources: Writer Does patient have a Lawyer or guardian?: Yes Name of representative payee or guardian: [place]   Alcohol/Substance Abuse:   What has been your use of drugs/alcohol within the last 12 months?: Cannabis - "1-2 blunts daily" If attempted suicide, did drugs/alcohol play a role in this?: No Alcohol/Substance Abuse Treatment Hx: Past Tx, Outpatient If yes, describe treatment: [facility] Has alcohol/substance abuse ever caused legal problems?: No  Social Support System:   Patient's Community Support System: Good Describe Community Support System: Act Team, sister, aunt, cousins Type of faith/religion: [employer]  How does patient's faith help to cope with current illness?: praying, going to church  Leisure/Recreation:   Leisure and Hobbies: music, basketball, playing cards singing, dancing   Strengths/Needs:   What things does the patient do well?:  read, excersice, eat healthy, sleep well, dress well, look well  In what areas does patient struggle / problems for patient: saying no sometimes, peer pressure  Discharge Plan:   Does patient have access to transportation?: Yes(pt reports he can call his peer support specialist) Will patient be returning to same living situation after discharge?: Yes Currently receiving community mental health services: Yes (From [facility])([facility]-ACT Team ) Does patient have financial barriers related to discharge medications?: No  Summary/Recommendations:   Patient is a 32 year old male admitted with chronic mental illness, schizoaffective d/o, schizophrenia admitted from ED voluntarily. Patient was brought to the emergency room because he was at a local gas station pacing around talking to himself acting strange. Patient reports hearing voices are gotten worse lately, has been anxious and depressed. Reports voices commenting on his actions like "he is breathing." Patient admits that he is not fully compliant with his medicine all the time, skips medications here and there. Patient has an ACT team that visits him regularly. Patient continues to smoke cannabis - states he smokes about 2-3 x/week. Patient will benefit from crisis stabilization, medication evaluation, group therapy and psychoeducation. In addition to case management for discharge planning. At discharge it is recommended that patient adhere to the established discharge plan and continue treatment.   Kersti Scavone  CUEBAS-COLON, LCSWA  09/26/2017

## 2017-09-26 NOTE — B[facility] Group Notes (Signed)
LCSW Group Therapy Note 09/26/2017 1:15pm  Type of Therapy and Topic: Group Therapy: Feelings Around Returning Home & Establishing a Supportive Framework and Supporting Oneself When Supports Not Available  Participation Level: Active  Description of Group:  Patients first processed thoughts and feelings about upcoming discharge. These included fears of upcoming changes, lack of change, new living environments, judgements and expectations from others and overall stigma of mental health issues. The group then discussed the definition of a supportive framework, what that looks and feels like, and how do to discern it from an unhealthy non-supportive network. The group identified different types of supports as well as what to do when your family/friends are less than helpful or unavailable  Therapeutic Goals  1. Patient will identify one healthy supportive network that they can use at discharge. 2. Patient will identify one factor of a supportive framework and how to tell it from an unhealthy network. 3. Patient able to identify one coping skill to use when they do not have positive supports from others. 4. Patient will demonstrate ability to communicate their needs through discussion and/or role plays.  Summary of Patient Progress:  Pt engaged during group session. As patients processed their anxiety about discharge and described healthy supports patient shared he is not ready to go back to his apartment. He stated he likes to be surrounded by positive people and here he gets what he needs.  Patients identified at least one self-care tool they were willing to use after discharge.   Therapeutic Modalities Cognitive Behavioral Therapy Motivational Interviewing   Simmie Garin  CUEBAS-COLON, LCSW 09/26/2017 4:04 PM

## 2017-09-26 NOTE — [facility] Group Notes (Signed)
[facility] Group Notes:  (Nursing/MHT/Case Management/Adjunct)  Date:  09/26/2017  Time:  9:47 PM  Type of Therapy:  Group Therapy  Participation Level:  Active  Participation Quality:  Appropriate  Affect:  Appropriate  Cognitive:  Alert  Insight:  Good  Engagement in Group:  Engaged  Modes of Intervention:  Support  Summary of Progress/Problems:  [name] 09/26/2017, 9:47 PM

## 2017-09-27 MED ORDER — TRAZODONE HCL 50 MG PO TABS: 150.0000 mg | ORAL_TABLET | Freq: Every evening | ORAL | Status: DC | PRN

## 2017-09-27 NOTE — Plan of Care (Signed)
  Problem: Education: Goal: Utilization of techniques to improve thought processes will improve Outcome: Progressing Goal: Knowledge of the prescribed therapeutic regimen will improve Outcome: Progressing   Problem: Coping: Goal: Coping ability will improve Outcome: Progressing Goal: Will verbalize feelings Outcome: Progressing   Problem: Self-Concept: Goal: Level of anxiety will decrease Outcome: Progressing   Problem: Education: Goal: Knowledge of disease or condition will improve Outcome: Progressing   Problem: Safety: Goal: Ability to remain free from injury will improve Outcome: Progressing   Problem: Education: Goal: Will be free of psychotic symptoms Outcome: Progressing   Problem: Coping: Goal: Will verbalize feelings Outcome: Progressing   Problem: Coping: Goal: Level of anxiety will decrease Outcome: Progressing

## 2017-09-27 NOTE — [facility] Group Notes (Signed)
LCSW Group Therapy Note 09/27/2017 1:00pm Type of Therapy and Topic:  Group Therapy:  Communication Participation Level:  Active  Description of Group: Patients will identify how individuals communicate with one another appropriately and inappropriately.  Patients will be guided to discuss their thoughts, feelings and behaviors related to barriers when communicating.  The group will process together ways to execute positive and appropriate communication with attention given to how one uses behavior, tone and body language.  Patients will be encouraged to reflect on a situation where they were successfully able to communicate and what made this example successful.  Group will identify specific changes they are motivated to make in order to overcome communication barriers with self, peers, authority, and parents.  This group will be process-oriented with patients participating in exploration of their own experiences, giving and receiving support, and challenging self and other group members.   Therapeutic Goals 1. Patient will express an understanding of the four types of verbal communication (passive, aggressive, passive aggressive, and assertive).  2. Patient will identify feelings (such as fear or worry), thought process and behaviors related to why people internalize feelings rather than express self openly. 3. Patient will identify their main communication style, and report how they plan to change their style to assertive. 4. Members will then practice through role play how to communicate using I statements, I feel statements, and acknowledging feelings rather than displacing feelings on others Summary of Patient Progress: Pt continues to work towards their tx goals but has not yet reached them. Pt was able to appropriately participate in group discussion, and was able to offer support/validation to other group members. Pt reported he feels he "mainly uses assertive communication. I try to listen to  others and stick up for myself too."  Therapeutic Modalities Cognitive Behavioral Therapy Motivational Interviewing Solution Focused Therapy  Heidi DachKelsey Vaidehi Braddy, MSW, LCSW Clinical Social Worker 09/27/2017 1:51 PM

## 2017-09-27 NOTE — Progress Notes (Signed)
Patient pleasant and cooperative with care. Medication and group compliant. Appropriate with staff and peers. Denies SI, HI, AVH. Visible in milieu. No psychotic episodes noted. Encouragement and support offered. Safety checks maintained. Pt receptive and remains safe on unit with q 15 min checks.

## 2017-09-27 NOTE — Plan of Care (Signed)
Patient is calm and cooperative and socializing well with peers voice no complain at this time, takes his medications and feeling well improving in all areas, no distress, patient is safe in the unit denies any SI/HI and no signs of AVH noted at this time. 15 minut rounding is in progress. Problem: Education: Goal: Utilization of techniques to improve thought processes will improve Outcome: Progressing Goal: Knowledge of the prescribed therapeutic regimen will improve Outcome: Progressing   Problem: Coping: Goal: Coping ability will improve Outcome: Progressing Goal: Will verbalize feelings Outcome: Progressing   Problem: Self-Concept: Goal: Level of anxiety will decrease Outcome: Progressing   Problem: Education: Goal: Knowledge of disease or condition will improve Outcome: Progressing   Problem: Safety: Goal: Ability to remain free from injury will improve Outcome: Progressing   Problem: Education: Goal: Will be free of psychotic symptoms Outcome: Progressing   Problem: Coping: Goal: Will verbalize feelings Outcome: Progressing   Problem: Coping: Goal: Level of anxiety will decrease Outcome: Progressing

## 2017-09-27 NOTE — Progress Notes (Signed)
[facility]  09/27/2017 3:55 PM Anthony BarthelBrandon Wayne Luna  MRN:  [id number] Subjective:   I am ok" Pt continue to endorse AH with command eg " look left" , states "I talk back to them". Denies SI/HI.  Taking meds, denies side effects. Slept 5.5 hrs with prn trazoodne. Principal Problem: <principal problem not specified> Diagnosis:   Patient Active Problem List   Diagnosis Date Noted  . Schizoaffective disorder (HCC) [F25.9] 09/25/2017  . Cannabis use disorder, severe, dependence (HCC) [F12.20] 11/30/2014  . Schizoaffective disorder, bipolar type (HCC) [F25.0] 11/30/2014  . Tobacco use disorder [F17.200] 11/30/2014   Total Time spent with patient: 30 minutes  Past Psychiatric History: no new information  Past Medical History:  Past Medical History:  Diagnosis Date  . Depression    Since moving into new apartment 1 year ago  . None to low serum cortisol response with adrenocorticotrophic hormone (ACTH) stimulation test   . Schizophrenia, paranoid type (HCC)    History reviewed. No pertinent surgical history. Family History: History reviewed. No pertinent family history. Family Psychiatric  History: no new info Social History:  Social History   Substance and Sexual Activity  Alcohol Use Yes  . Alcohol/week: 0.0 oz   Comment: "Special occasions"     Social History   Substance and Sexual Activity  Drug Use Yes  . Frequency: 4.0 times per week  . Types: Marijuana   Comment: "2 blunts"    Social History   Socioeconomic History  . Marital status: Single    Spouse name: Not on file  . Number of children: Not on file  . Years of education: Not on file  . Highest education level: Not on file  Occupational History  . Not on file  Social Needs  . Financial resource strain: Not on file  . Food insecurity:    Worry: Not on file    Inability: Not on file  . Transportation needs:    Medical: Not on file    Non-medical: Not on file  Tobacco Use  . Smoking status:  Current Every Day Smoker    Packs/day: 0.50    Types: Cigarettes    Start date: 11/29/2002  . Smokeless tobacco: Former NeurosurgeonUser  . Tobacco comment: Patient wishes to quit today.  Substance and Sexual Activity  . Alcohol use: Yes    Alcohol/week: 0.0 oz    Comment: "Special occasions"  . Drug use: Yes    Frequency: 4.0 times per week    Types: Marijuana    Comment: "2 blunts"  . Sexual activity: Not on file  Lifestyle  . Physical activity:    Days per week: Not on file    Minutes per session: Not on file  . Stress: Not on file  Relationships  . Social connections:    Talks on phone: Not on file    Gets together: Not on file    Attends religious service: Not on file    Active member of club or organization: Not on file    Attends meetings of clubs or organizations: Not on file    Relationship status: Not on file  Other Topics Concern  . Not on file  Social History Narrative  . Not on file   Additional Social History:    Pain Medications: See PTA Prescriptions: See PTA Over the Counter: See PTA History of alcohol / drug use?: Yes Negative Consequences of Use: Financial, Personal relationships, Work / School Name of Substance 1: Alcohol 1 -  Amount (size/oz): 2-3 Drinks 1 - Frequency: "Special occasions" 1 - Last Use / Amount: Last night (4/11)                  Sleep: Poor  Appetite:  Fair  Current Medications: Current Facility-Administered Medications  Medication Dose Route Frequency Provider Last Rate Last Dose  . acetaminophen (TYLENOL) tablet 650 mg  650 mg Oral Q6H PRN [name], [name], MD      . alum & mag hydroxide-simeth (MAALOX/MYLANTA) 200-200-20 MG/5ML suspension 30 mL  30 mL Oral Q4H PRN [name], [name], MD      . amantadine (SYMMETREL) capsule 100 mg  100 mg Oral BID [name], [name], MD   100 mg at 09/27/17 0826  . fluPHENAZine (PROLIXIN) tablet 10 mg  10 mg Oral BID [name], [name], MD   10 mg at 09/27/17 1610  . hydrOXYzine (ATARAX/VISTARIL) tablet  50 mg  50 mg Oral TID PRN [name], [name], MD      . lithium carbonate (ESKALITH) CR tablet 450 mg  450 mg Oral Q12H [name], [name], MD   450 mg at 09/27/17 0826  . magnesium hydroxide (MILK OF MAGNESIA) suspension 30 mL  30 mL Oral Daily PRN [name], [name], MD      . nicotine (NICODERM CQ - dosed in mg/24 hours) patch 21 mg  21 mg Transdermal Daily Beverly Sessions, MD   21 mg at 09/27/17 0826  . propranolol (INDERAL) tablet 10 mg  10 mg Oral TID [name], [name], MD   10 mg at 09/27/17 1158  . traZODone (DESYREL) tablet 100 mg  100 mg Oral QHS PRN [name], [name], MD   100 mg at 09/26/17 2137    Lab Results:  Results for orders placed or performed during the hospital encounter of 09/25/17 (from the past 48 hour(s))  Hemoglobin A1c     Status: Abnormal   Collection Time: 09/26/17  7:17 AM  Result Value Ref Range   Hgb A1c MFr Bld 4.7 (L) 4.8 - 5.6 %    Comment: (NOTE) Pre diabetes:          5.7%-6.4% Diabetes:              >6.4% Glycemic control for   <7.0% adults with diabetes    Mean Plasma Glucose 88.19 mg/dL    Comment: Performed at [facility], [address], [place], [place] [address]  Lipid panel     Status: None   Collection Time: 09/26/17  7:17 AM  Result Value Ref Range   Cholesterol 153 0 - 200 mg/dL   Triglycerides 64 <409 mg/dL   HDL 55 >81 mg/dL   Total CHOL/HDL Ratio 2.8 RATIO   VLDL 13 0 - 40 mg/dL   LDL Cholesterol 85 0 - 99 mg/dL    Comment:        Total Cholesterol/HDL:CHD Risk Coronary Heart Disease Risk Table                     Men   Women  1/2 Average Risk   3.4   3.3  Average Risk       5.0   4.4  2 X Average Risk   9.6   7.1  3 X Average Risk  23.4   11.0        Use the calculated Patient Ratio above and the CHD Risk Table to determine the patient's CHD Risk.  ATP III CLASSIFICATION (LDL):  <100     mg/dL   Optimal  725-366  mg/dL   Near or Above                    Optimal  130-159  mg/dL   Borderline  440-347  mg/dL   High   >425     mg/dL   Very High Performed at [facility], [address] [place], [place] [address]   TSH     Status: None   Collection Time: 09/26/17  7:17 AM  Result Value Ref Range   TSH 0.955 0.350 - 4.500 uIU/mL    Comment: Performed by a 3rd Generation assay with a functional sensitivity of <=0.01 uIU/mL. Performed at [facility], [address] [place], [place] [address]     Blood Alcohol level:  Lab Results  Component Value Date   [facility] <10 09/25/2017   ETH <10 04/07/2017    Metabolic Disorder Labs: Lab Results  Component Value Date   HGBA1C 4.7 (L) 09/26/2017   MPG 88.19 09/26/2017   MPG 105.41 04/08/2017   Lab Results  Component Value Date   PROLACTIN 6.1 12/19/2014   PROLACTIN 1.3 (L) 11/30/2014   Lab Results  Component Value Date   CHOL 153 09/26/2017   TRIG 64 09/26/2017   HDL 55 09/26/2017   CHOLHDL 2.8 09/26/2017   VLDL 13 09/26/2017   LDLCALC 85 09/26/2017   LDLCALC 95 04/08/2017    Physical Findings: AIMS: Facial and Oral Movements Muscles of Facial Expression: None, normal Lips and Perioral Area: None, normal Jaw: None, normal Tongue: None, normal,Extremity Movements Upper (arms, wrists, hands, fingers): None, normal Lower (legs, knees, ankles, toes): None, normal, Trunk Movements Neck, shoulders, hips: None, normal, Overall Severity Severity of abnormal movements (highest score from questions above): None, normal Incapacitation due to abnormal movements: None, normal Patient's awareness of abnormal movements (rate only patient's report): No Awareness, Dental Status Current problems with teeth and/or dentures?: No Does patient usually wear dentures?: No  CIWA:  CIWA-Ar Total: 1 COWS:  COWS Total Score: 1  Musculoskeletal: Strength & Muscle Tone: within normal limits Gait & Station: normal Patient leans:   Psychiatric Specialty Exam: Physical Exam  Nursing note and vitals reviewed.   ROS  Blood pressure 99/65,  pulse 79, temperature 98.6 F (37 C), temperature source Oral, resp. rate 18, height 6' (1.829 m), weight 85.7 kg (189 lb), SpO2 97 %.Body mass index is 25.63 kg/m.   General Appearance: Disheveled  Eye Contact:  Poor  Speech:  Slow  Volume:  Normal  Mood:  Anxious  Affect:  blunted  Thought Process:  tangential  Orientation:  ox3  Thought Content:  Illogical, talking to self at times  Suicidal Thoughts:  No  Homicidal Thoughts:  denies  Memory:  intact  Judgement:  Fair  Insight:  Fair  Psychomotor Activity:  Normal  Concentration:  fair  Recall:  Good  Fund of Knowledge:  Good  Language:  Good  Akathisia:  No  Handed:    AIMS (if indicated):     Assets:  Communication Skills Desire for Improvement  ADL's:  Intact  Cognition:  WNL  Sleep:  Number of Hours: 5.5      Treatment Plan Summary: Pt continue to be psychotic. Poor sleep with 100mg  trazodone. Continue  lithium and  Prolixin . QTc-410. TSH wnl. Li level- 0.46 but pt admits missing dosage of medicines.  Pt on invega sustenna- states  he got last shot on 09/16/17, dose unsure,  collateral from ACT team- pending. Increase trazodone for sleep.     Beverly Sessions, MD 09/27/2017, 3:55 PM

## 2017-09-28 MED ORDER — BENZTROPINE MESYLATE 1 MG PO TABS
1.0000 mg | ORAL_TABLET | Freq: Two times a day (BID) | ORAL | Status: DC
  Administered 2017-09-28 – 2017-09-30 (×4): 1 mg via ORAL
  Filled 2017-09-28 (×4): qty 1

## 2017-09-28 MED ORDER — OLANZAPINE 10 MG PO TABS
10.0000 mg | ORAL_TABLET | Freq: Every day | ORAL | Status: DC
  Administered 2017-09-28 – 2017-09-29 (×2): 10 mg via ORAL
  Filled 2017-09-28 (×2): qty 1

## 2017-09-28 NOTE — Progress Notes (Addendum)
[facility] MD Progress Note  09/28/2017 1:55 PM Anthony Luna  MRN:  [id number] Subjective:  History was reviewed with patient. He states that he was at [employer] the other day and was having loud voices. He states that he was thinking that other people were talking about him but it was the voices. He states, "I decided to come get help before I hurt myself or other people because of the voices." He states that he has always heard voices and they have never completely gone away but recently have been louder. He states that he has not been doing well since he has been living on his own. He states that he gets paranoid a lot and gets lonely at home. HE liked it better when he was at a group home. He admits taht he did miss 2-3 doses of his medications. HE states taht he does not fee like they are helpful anyways. He states that he feels very restless on medications. HE states taht he has been on many medications in the past. He states that Haldol "made me crazy in the head." He has also tried Abilify, Zyprexa, Prolixin. He is in school at [employer] getting his GED and missed a few classes. Marland Kitchen He is able to name a few of his medications including Vitamin D, Cogentin, and INvega.  [facility] faxed over medications list which includes Eyvonne Mechanic (last dose given 09/18/17), hydroxyzine 25 mg BID prn, Prolixin 10 mg BID, amantadine 100 mg BID, Cogentin 1 mg BID, Chantix 0.5 mg BID, Invega Trinza  819 mg, Lithium 450 mg BID.  He reports that he continues to hear voices today with no improvement. However, he told staff that voices were improving. He does have some delay in responses at times and appears to be responding to internal stimuli per RN staff. He reports feeling paranoid but unable to elaborate on this. He states that he is not feeling suicidal but sometimes the voices tell him to harm himself. He is open to medication changes. He asked very appropriately questions about what schizophrenia was and what  medications help with. HE states that he does not feel like medications have ever helped him.   Per chart review, pt has chronic AH and does have history of threatening and agitated behaviors in the past. Past medication trials include Seroquel, Abilify Maintenna, Lamictal  Principal Problem: <principal problem not specified> Diagnosis:   Patient Active Problem List   Diagnosis Date Noted  . Schizoaffective disorder (HCC) [F25.9] 09/25/2017  . Cannabis use disorder, severe, dependence (HCC) [F12.20] 11/30/2014  . Schizoaffective disorder, bipolar type (HCC) [F25.0] 11/30/2014  . Tobacco use disorder [F17.200] 11/30/2014   Total Time spent with patient: 20 minutes  Past Psychiatric History: See H&P  Past Medical History:  Past Medical History:  Diagnosis Date  . Depression    Since moving into new apartment 1 year ago  . None to low serum cortisol response with adrenocorticotrophic hormone (ACTH) stimulation test   . Schizophrenia, paranoid type (HCC)    History reviewed. No pertinent surgical history. Family History: History reviewed. No pertinent family history. Family Psychiatric  History: See H&P Social History:  Social History   Substance and Sexual Activity  Alcohol Use Yes  . Alcohol/week: 0.0 oz   Comment: "Special occasions"     Social History   Substance and Sexual Activity  Drug Use Yes  . Frequency: 4.0 times per week  . Types: Marijuana   Comment: "2 blunts"    Social  History   Socioeconomic History  . Marital status: Single    Spouse name: Not on file  . Number of children: Not on file  . Years of education: Not on file  . Highest education level: Not on file  Occupational History  . Not on file  Social Needs  . Financial resource strain: Not on file  . Food insecurity:    Worry: Not on file    Inability: Not on file  . Transportation needs:    Medical: Not on file    Non-medical: Not on file  Tobacco Use  . Smoking status: Current Every Day  Smoker    Packs/day: 0.50    Types: Cigarettes    Start date: 11/29/2002  . Smokeless tobacco: Former Neurosurgeon  . Tobacco comment: Patient wishes to quit today.  Substance and Sexual Activity  . Alcohol use: Yes    Alcohol/week: 0.0 oz    Comment: "Special occasions"  . Drug use: Yes    Frequency: 4.0 times per week    Types: Marijuana    Comment: "2 blunts"  . Sexual activity: Not on file  Lifestyle  . Physical activity:    Days per week: Not on file    Minutes per session: Not on file  . Stress: Not on file  Relationships  . Social connections:    Talks on phone: Not on file    Gets together: Not on file    Attends religious service: Not on file    Active member of club or organization: Not on file    Attends meetings of clubs or organizations: Not on file    Relationship status: Not on file  Other Topics Concern  . Not on file  Social History Narrative  . Not on file   Additional Social History:    Pain Medications: See PTA Prescriptions: See PTA Over the Counter: See PTA History of alcohol / drug use?: Yes Negative Consequences of Use: Financial, Personal relationships, Work / School Name of Substance 1: Alcohol 1 - Amount (size/oz): 2-3 Drinks 1 - Frequency: "Special occasions" 1 - Last Use / Amount: Last night (4/11)                  Sleep: Fair  Appetite:  Fair  Current Medications: Current Facility-Administered Medications  Medication Dose Route Frequency Provider Last Rate Last Dose  . acetaminophen (TYLENOL) tablet 650 mg  650 mg Oral Q6H PRN [name], [name], MD      . alum & mag hydroxide-simeth (MAALOX/MYLANTA) 200-200-20 MG/5ML suspension 30 mL  30 mL Oral Q4H PRN [name], [name], MD      . amantadine (SYMMETREL) capsule 100 mg  100 mg Oral BID [name], [name], MD   100 mg at 09/28/17 0828  . benztropine (COGENTIN) tablet 1 mg  1 mg Oral BID Shellia Hartl R, MD      . hydrOXYzine (ATARAX/VISTARIL) tablet 50 mg  50 mg Oral TID PRN [name], [name],  MD      . lithium carbonate (ESKALITH) CR tablet 450 mg  450 mg Oral Q12H [name], [name], MD   450 mg at 09/28/17 0829  . magnesium hydroxide (MILK OF MAGNESIA) suspension 30 mL  30 mL Oral Daily PRN [name], [name], MD      . nicotine (NICODERM CQ - dosed in mg/24 hours) patch 21 mg  21 mg Transdermal Daily Beverly Sessions, MD   21 mg at 09/28/17 0829  . OLANZapine (ZYPREXA) tablet  10 mg  10 mg Oral QHS Toneisha Savary R, MD      . propranolol (INDERAL) tablet 10 mg  10 mg Oral TID [name], [name], MD   10 mg at 09/28/17 1157  . traZODone (DESYREL) tablet 150 mg  150 mg Oral QHS PRN [name], Jagannath, MD        Lab Results: No results found for this or any previous visit (from the past 48 hour(s)).  Blood Alcohol level:  Lab Results  Component Value Date   ETH <10 09/25/2017   ETH <10 04/07/2017    Metabolic Disorder Labs: Lab Results  Component Value Date   HGBA1C 4.7 (L) 09/26/2017   MPG 88.19 09/26/2017   MPG 105.41 04/08/2017   Lab Results  Component Value Date   PROLACTIN 6.1 12/19/2014   PROLACTIN 1.3 (L) 11/30/2014   Lab Results  Component Value Date   CHOL 153 09/26/2017   TRIG 64 09/26/2017   HDL 55 09/26/2017   CHOLHDL 2.8 09/26/2017   VLDL 13 09/26/2017   LDLCALC 85 09/26/2017   LDLCALC 95 04/08/2017    Physical Findings: AIMS: Facial and Oral Movements Muscles of Facial Expression: None, normal Lips and Perioral Area: None, normal Jaw: None, normal Tongue: None, normal,Extremity Movements Upper (arms, wrists, hands, fingers): None, normal Lower (legs, knees, ankles, toes): None, normal, Trunk Movements Neck, shoulders, hips: None, normal, Overall Severity Severity of abnormal movements (highest score from questions above): None, normal Incapacitation due to abnormal movements: None, normal Patient's awareness of abnormal movements (rate only patient's report): No Awareness, Dental Status Current problems with teeth and/or dentures?: No Does patient  usually wear dentures?: No  CIWA:  CIWA-Ar Total: 1 COWS:  COWS Total Score: 1  Musculoskeletal: Strength & Muscle Tone: within normal limits Gait & Station: normal Patient leans: Backward  Psychiatric Specialty Exam: Physical Exam  ROS  Blood pressure 105/69, pulse (!) 58, temperature 98.3 F (36.8 C), temperature source Oral, resp. rate 16, height 6' (1.829 m), weight 85.7 kg (189 lb), SpO2 100 %.Body mass index is 25.63 kg/m.  General Appearance: Casual  Eye Contact:  Fair  Speech:  Clear and Coherent  Volume:  Normal  Mood:  Depressed  Affect:  Constricted  Thought Process:  Coherent and Goal Directed  Orientation:  Full (Time, Place, and Person)  Thought Content:  Hallucinations: Auditory  Suicidal Thoughts:  No  Homicidal Thoughts:  No  Memory:  Immediate;   Fair  Judgement:  Fair  Insight:  Lacking  Psychomotor Activity:  Normal  Concentration:  Concentration: Poor  Recall:  FiservFair  Fund of Knowledge:  Fair  Language:  Fair  Akathisia:  No      Assets:  Resilience  ADL's:  Intact  Cognition:  WNL  Sleep:  Number of Hours: 6     Treatment Plan Summary: 32 yo male admitted due to worsening AH. HE has chronic mental illness and several hospitalizations. HE has been off medications for a few days and AH have worsened. He does not feel that his medications are helpful. He does appear to be responding to internal stimuli at times and talking to himself. HE does have some delay in responses. HE does have a lot of distress related to voices. He does not like living alone which is partly likely worsening psychotic symptoms due to stress. HE would prefer a group home. He also smokes MJ regularly which likely exacerbates the psychotic symptom.   Plan:  Schizoaffective disorder -Pt is on  INvega Trinza 819 mg. Last dose given on 09/18/17 -d/c Prolixin as he dose not feel it is effective at all -Start Zyprexa 10 mg qhs -Continue propranolol for akathisia -Continue Symmetrel  for tremor -Restart Cogentin 1 mg BID -Continue Lithium 450 mg BID. Level 0.46 on admission but did miss a few doses -QTc 410  Marijuana use disorder -Strongly advised to abstain from using marijuana as it can worsen psychosis and voices  Dispo -Pt will return to apartment on discharge. CSW will speak to CST about looking into group home placement. Attempted to call Dr. Boneta Lucks, hs outpatient psychiatrist but no answer at the clinic.    [name], MD 09/28/2017, 1:55 PM

## 2017-09-28 NOTE — Progress Notes (Signed)
CSW contacted [facility] at [phone] to request pt's medication list and the date of his last injection. CSW spoke with Caitlyn who reported she would fax the information to CSW this morning. CSW will follow up as needed.   Heidi DachKelsey Leida Luton, MSW, LCSW Clinical Social Worker 09/28/2017 8:50 AM

## 2017-09-28 NOTE — Tx Team (Addendum)
Interdisciplinary Treatment and Diagnostic Plan Update  09/28/2017 Time of Session: [address] Anthony Luna MRN: [id number]  Principal Diagnosis: <principal problem not specified>  Secondary Diagnoses: Active Problems:   Schizoaffective disorder (HCC)   Current Medications:  Current Facility-Administered Medications  Medication Dose Route Frequency Provider Last Rate Last Dose  . acetaminophen (TYLENOL) tablet 650 mg  650 mg Oral Q6H PRN [name], [name], MD      . alum & mag hydroxide-simeth (MAALOX/MYLANTA) 200-200-20 MG/5ML suspension 30 mL  30 mL Oral Q4H PRN [name], [name], MD      . amantadine (SYMMETREL) capsule 100 mg  100 mg Oral BID [name], [name], MD   100 mg at 09/28/17 [id number]  . hydrOXYzine (ATARAX/VISTARIL) tablet 50 mg  50 mg Oral TID PRN [name], [name], MD      . lithium carbonate (ESKALITH) CR tablet 450 mg  450 mg Oral Q12H [name], [name], MD   450 mg at 09/28/17 0829  . magnesium hydroxide (MILK OF MAGNESIA) suspension 30 mL  30 mL Oral Daily PRN [name], [name], MD      . nicotine (NICODERM CQ - dosed in mg/24 hours) patch 21 mg  21 mg Transdermal Daily Anthony Luna, Jagannath, MD   21 mg at 09/28/17 0829  . OLANZapine (ZYPREXA) tablet 10 mg  10 mg Oral QHS [name], [name], MD      . propranolol (INDERAL) tablet 10 mg  10 mg Oral TID [name], [name], MD   10 mg at 09/28/17 [id number]  . traZODone (DESYREL) tablet 150 mg  150 mg Oral QHS PRN Anthony Luna, Jagannath, MD       PTA Medications: Medications Prior to Admission  Medication Sig Dispense Refill Last Dose  . amantadine (SYMMETREL) 100 MG capsule Take 1 capsule (100 mg total) by mouth 2 (two) times daily. 60 capsule 0 09/24/2017 at 1800  . amoxicillin (AMOXIL) 500 MG capsule Take 1 capsule (500 mg total) by mouth every 8 (eight) hours. (Patient not taking: Reported on 09/25/2017) 21 capsule 0 Not Taking at Unknown time  . benztropine (COGENTIN) 1 MG tablet Take 1 mg by mouth daily.   09/24/2017 at 2000  . clonazePAM (KLONOPIN)  0.5 MG tablet Take 1 tablet (0.5 mg total) by mouth at bedtime. (Patient not taking: Reported on 09/25/2017) 30 tablet 0 Not Taking at Unknown time  . fluPHENAZine (PROLIXIN) 10 MG tablet Take 1 tablet (10 mg total) by mouth 2 (two) times daily before a meal. 60 tablet 0 09/24/2017 at 1800  . hydrOXYzine (ATARAX/VISTARIL) 25 MG tablet Take 1 tablet (25 mg total) by mouth 3 (three) times daily as needed for itching. 15 tablet 0 prn at prn  . lithium carbonate (ESKALITH) 450 MG CR tablet Take 450 mg by mouth 2 (two) times daily.   09/24/2017 at 2000  . lithium carbonate (LITHOBID) 300 MG CR tablet Take 2 tablets (600 mg total) by mouth every 12 (twelve) hours. (Patient not taking: Reported on 09/25/2017) 120 tablet 1 Not Taking at Unknown time  . Paliperidone Palmitate (INVEGA TRINZA) 819 MG/2.625ML SUSP Inject 819 mg into the muscle.   Past Month at Unknown time  . propranolol (INDERAL) 10 MG tablet Take 1 tablet (10 mg total) by mouth 3 (three) times daily. 90 tablet 1 unknown at unknown    Patient Stressors: Educational concerns Health problems Substance abuse  Patient Strengths: Ability for insight Average or above average intelligence Capable of independent living Barrister's clerkCommunication skills Motivation for treatment/growth Religious  Affiliation Supportive family/friends  Treatment Modalities: Medication Management, Group therapy, Case management,  1 to 1 session with clinician, Psychoeducation, Recreational therapy.   Physician Treatment Plan for Primary Diagnosis: <principal problem not specified> Long Term Goal(s): Improvement in symptoms so as ready for discharge Improvement in symptoms so as ready for discharge   Short Term Goals: Ability to identify changes in lifestyle to reduce recurrence of condition will improve Ability to verbalize feelings will improve Ability to disclose and discuss suicidal ideas Ability to demonstrate self-control will improve Ability to identify and develop  effective coping behaviors will improve Ability to maintain clinical measurements within normal limits will improve Compliance with prescribed medications will improve Ability to identify triggers associated with substance abuse/mental health issues will improve Ability to identify changes in lifestyle to reduce recurrence of condition will improve Ability to verbalize feelings will improve Ability to disclose and discuss suicidal ideas Ability to demonstrate self-control will improve Ability to identify and develop effective coping behaviors will improve Ability to maintain clinical measurements within normal limits will improve Compliance with prescribed medications will improve Ability to identify triggers associated with substance abuse/mental health issues will improve  Medication Management: Evaluate patient's response, side effects, and tolerance of medication regimen.  Therapeutic Interventions: 1 to 1 sessions, Unit Group sessions and Medication administration.  Evaluation of Outcomes: Progressing  Physician Treatment Plan for Secondary Diagnosis: Active Problems:   Schizoaffective disorder (HCC)  Long Term Goal(s): Improvement in symptoms so as ready for discharge Improvement in symptoms so as ready for discharge   Short Term Goals: Ability to identify changes in lifestyle to reduce recurrence of condition will improve Ability to verbalize feelings will improve Ability to disclose and discuss suicidal ideas Ability to demonstrate self-control will improve Ability to identify and develop effective coping behaviors will improve Ability to maintain clinical measurements within normal limits will improve Compliance with prescribed medications will improve Ability to identify triggers associated with substance abuse/mental health issues will improve Ability to identify changes in lifestyle to reduce recurrence of condition will improve Ability to verbalize feelings will  improve Ability to disclose and discuss suicidal ideas Ability to demonstrate self-control will improve Ability to identify and develop effective coping behaviors will improve Ability to maintain clinical measurements within normal limits will improve Compliance with prescribed medications will improve Ability to identify triggers associated with substance abuse/mental health issues will improve     Medication Management: Evaluate patient's response, side effects, and tolerance of medication regimen.  Therapeutic Interventions: 1 to 1 sessions, Unit Group sessions and Medication administration.  Evaluation of Outcomes: Progressing   RN Treatment Plan for Primary Diagnosis: <principal problem not specified> Long Term Goal(s): Knowledge of disease and therapeutic regimen to maintain health will improve  Short Term Goals: Ability to participate in decision making will improve, Ability to identify and develop effective coping behaviors will improve and Compliance with prescribed medications will improve  Medication Management: RN will administer medications as ordered by provider, will assess and evaluate patient's response and provide education to patient for prescribed medication. RN will report any adverse and/or side effects to prescribing provider.  Therapeutic Interventions: 1 on 1 counseling sessions, Psychoeducation, Medication administration, Evaluate responses to treatment, Monitor vital signs and CBGs as ordered, Perform/monitor CIWA, COWS, AIMS and Fall Risk screenings as ordered, Perform wound care treatments as ordered.  Evaluation of Outcomes: Progressing   LCSW Treatment Plan for Primary Diagnosis: <principal problem not specified> Long Term Goal(s): Safe transition to appropriate next level of  care at discharge, Engage patient in therapeutic group addressing interpersonal concerns.  Short Term Goals: Engage patient in aftercare planning with referrals and resources,  Increase emotional regulation and Increase skills for wellness and recovery  Therapeutic Interventions: Assess for all discharge needs, 1 to 1 time with Social worker, Explore available resources and support systems, Assess for adequacy in community support network, Educate family and significant other(s) on suicide prevention, Complete Psychosocial Assessment, Interpersonal group therapy.  Evaluation of Outcomes: Progressing   Progress in Treatment: Attending groups: Yes. Participating in groups: Yes. Taking medication as prescribed: Yes. Toleration medication: Yes. Family/Significant other contact made: No, will contact:  a support if pt provides consent. Patient understands diagnosis: Yes. Discussing patient identified problems/goals with staff: Yes. Medical problems stabilized or resolved: Yes. Denies suicidal/homicidal ideation: Yes. Issues/concerns per patient self-inventory: No. Other: None at this time.   New problem(s) identified: No, Describe:  none at this time.   New Short Term/Long Term Goal(s): Pt reported his goal for treatment is to, "get on the right medications, be myself, stay focused, and be responsible."  Discharge Plan or Barriers: CSW will continue to assess for appropriate discharge plan.   Reason for Continuation of Hospitalization: Hallucinations Medication stabilization  Estimated Length of Stay: 5-7 days   Attendees: Patient: Anthony Luna 09/28/2017 11:13 AM  Physician: Dr. Johnella Moloney, MD 09/28/2017 11:13 AM  Nursing: Hulan Amato, RN 09/28/2017 11:13 AM  RN Care Manager: 09/28/2017 11:13 AM  Social Worker: Heidi Dach, LCSW 09/28/2017 11:13 AM  Recreational Therapist: Garret Reddish, CTRS-LRT 09/28/2017 11:13 AM  Other: Johny Shears, LCSWA 09/28/2017 11:13 AM  Other: Jake Shark, LCSW 09/28/2017 11:13 AM  Other: 09/28/2017 11:13 AM    Scribe for Treatment Team: Heidi Dach, LCSW 09/28/2017 11:13 AM

## 2017-09-28 NOTE — Progress Notes (Addendum)
Recreation Therapy Notes  INPATIENT RECREATION THERAPY ASSESSMENT  Patient Details Name: Anthony Luna MRN: [id number] DOB: 02/11/1986 Today's Date: 09/28/2017       Information Obtained From: Patient  Able to Participate in Assessment/Interview: Yes  Patient Presentation: Responsive  Reason for Admission (Per Patient): Med Non-Compliance  Patient Stressors: Other (Comment)(Medicine not working)  Coping Skills:   Substance Abuse, Exercise, Music, Other (Comment)(School)  Leisure Interests (2+):  Exercise - Walking, Music - Listen  Frequency of Recreation/Participation: Weekly  Awareness of Community Resources:  Yes  Community Resources:  [employer], [employer]  Current Use: Yes  If no, Barriers?:    Expressed Interest in State Street CorporationCommunity Resource Information:    [place]  Patient Main Form of Transportation: Other (Comment)(Peer support takes me places)  Patient Strengths:  Listen to others  Patient Identified Areas of Improvement:  Cooperating and encouraging others  Patient Goal for Hospitalization:   Get on the right medication and stay focused  Current SI (including self-harm):  No  Current HI:  No  Current AVH: No  Staff Intervention Plan: Group Attendance, Collaborate with Interdisciplinary Treatment Team  Consent to Intern Participation: N/A  Anthony Luna 09/28/2017, 2:47 PM

## 2017-09-28 NOTE — Plan of Care (Signed)
Patient is alert and oriented x 3. Reports that he slept good last night without any sleep medication. Appetite fair with normal energy and good concentration. Rates his depression at a 2 and his anxiety a 1. His goal for today is to stay focus and read. Milieu remains safe with q 15 minute safety checks. Will continue to monitor.

## 2017-09-28 NOTE — [facility] Group Notes (Signed)
[facility] Group Notes:  (Nursing/MHT/Case Management/Adjunct)  Date:  09/28/2017  Time:  3:11 PM  Type of Therapy:  Psychoeducational Skills  Participation Level:  Active  Participation Quality:  Appropriate  Affect:  Appropriate  Cognitive:  Appropriate  Insight:  Appropriate  Engagement in Group:  Engaged  Modes of Intervention:  Socialization  Summary of Progress/Problems:  Lynelle SmokeCara Travis Taquana Bartley 09/28/2017, 3:11 PM

## 2017-09-28 NOTE — Progress Notes (Signed)
Recreation Therapy Notes  Date: 09/28/2017  Time: 9:30 am  Location: Craft Room  Behavioral response: Appropriate  Intervention Topic: Values  Discussion/Intervention:  Group content today was focused on values. The group identified what values are and where they come from. Individuals expressed some values and how many they have. Patients described how they go about add or removing values. The group described the importance of having values and how they go about using them in daily life. Patient participated in the intervention "My Values" where they were able to pick out values that were important to them and make a visual aide.   Clinical Observations/Feedback:  Patient came to group and stated an important value to him is being humble. Individual was pulled from group by other discipline but later returned. He participated in the intervention and was social with peers and staff.  Dudley Mages LRT/CTRS         Merilyn Pagan 09/28/2017 11:37 AM

## 2017-09-29 DIAGNOSIS — F250 Schizoaffective disorder, bipolar type: Principal | ICD-10-CM

## 2017-09-29 NOTE — Progress Notes (Signed)
Recreation Therapy Notes  Date: 09/29/2017  Time: 9:30 am   Location: Craft Room   Behavioral response: N/A   Intervention Topic: Coping Skills   Discussion/Intervention: Patient did not attend group.   Clinical Observations/Feedback:  Patient did not attend group.   [name] LRT/CTRS        Anthony Luna 09/29/2017 11:43 AM 

## 2017-09-29 NOTE — Plan of Care (Signed)
Patient up in the milieu with steady gait. Ambulates the unit throughout the shift by himself. Patient's demeanor and affect this not his usual behavior. Patient seemed guarded and agitated and when he was asked what was wrong he would frown and say "nothing and walk away." Compliant with medication and meals. Milieu remains safe with q 15 minute safety checks.

## 2017-09-29 NOTE — [facility] Group Notes (Signed)
  09/29/2017  Time: 0900  Type of Therapy and Topic: Group Therapy: Goals Group: SMART Goals   Participation Level:  Active   Description of Group:   The purpose of a daily goals group is to assist and guide patients in setting recovery/wellness-related goals. The objective is to set goals as they relate to the crisis in which they were admitted. Patients will be using SMART goal modalities to set measurable goals. Characteristics of realistic goals will be discussed and patients will be assisted in setting and processing how one will reach their goal. Facilitator will also assist patients in applying interventions and coping skills learned in psycho-education groups to the SMART goal and process how one will achieve defined goal.   Therapeutic Goals:  -Patients will develop and document one goal related to or their crisis in which brought them into treatment.  -Patients will be guided by LCSW using SMART goal setting modality in how to set a measurable, attainable, realistic and time sensitive goal.  -Patients will process barriers in reaching goal.  -Patients will process interventions in how to overcome and successful in reaching goal.   Patient's Goal:  Pt continues to work towards their tx goals but has not yet reached them. Pt was able to appropriately participate in group discussion, and was able to offer support/validation to other group members. Pt reported his goal for the day is to, "walk at least 15 laps by the end of the day."   Therapeutic Modalities:  Motivational Interviewing  Cognitive Behavioral Therapy  Crisis Intervention Model  SMART goals setting  Heidi DachKelsey Johnelle Tafolla, MSW, LCSW Clinical Social Worker 09/29/2017 9:57 AM

## 2017-09-29 NOTE — [facility] Group Notes (Signed)
CSW contacted [facility] at [phone] to follow up with a member of pt's [employer] Support [facility]. CSW spoke with Marla RoeMelissa Martin, the Paramediccommunity outreach manager, who stated Tammy SoursGreg (pt's peer support specialist) is not in the office, but she would leave him a message to contact CSW upon his return to work. CSW will continue follow up as needed.   Heidi DachKelsey Khiree Bukhari, MSW, LCSW Clinical Social Worker 09/29/2017 10:29 AM

## 2017-09-29 NOTE — [facility] Group Notes (Signed)
09/29/2017 1PM  Type of Therapy/Topic:  Group Therapy:  Feelings about Diagnosis  Participation Level:  Active   Description of Group:   This group will allow patients to explore their thoughts and feelings about diagnoses they have received. Patients will be guided to explore their level of understanding and acceptance of these diagnoses. Facilitator will encourage patients to process their thoughts and feelings about the reactions of others to their diagnosis and will guide patients in identifying ways to discuss their diagnosis with significant others in their lives. This group will be process-oriented, with patients participating in exploration of their own experiences, giving and receiving support, and processing challenge from other group members.   Therapeutic Goals: 1. Patient will demonstrate understanding of diagnosis as evidenced by identifying two or more symptoms of the disorder 2. Patient will be able to express two feelings regarding the diagnosis 3. Patient will demonstrate their ability to communicate their needs through discussion and/or role play  Summary of Patient Progress: Actively and appropriately engaged in the group. Patient was able to provide support and validation to other group members.Patient practiced active listening when interacting with the facilitator and other group members. Anthony JunesBrandon spoke about his struggles with maintaining boundaries from people who use. He asked "what are coping skills?" The CSW and group members were able to provide him with an understanding and skills that he can use in the future. Patient in still in the process of obtaining treatment goals.        Therapeutic Modalities:   Cognitive Behavioral Therapy Brief Therapy Feelings Identification    Anthony ShearsCassandra  [name], LCSW 09/29/2017 1:54 PM

## 2017-09-29 NOTE — Plan of Care (Signed)
Resting in bed safe without any interruptions and no respiratory distress, 15 minute rounding is maintained. Problem: Education: Goal: Utilization of techniques to improve thought processes will improve Outcome: Progressing Goal: Knowledge of the prescribed therapeutic regimen will improve Outcome: Progressing   Problem: Coping: Goal: Coping ability will improve Outcome: Progressing Goal: Will verbalize feelings Outcome: Progressing   Problem: Self-Concept: Goal: Level of anxiety will decrease Outcome: Progressing   Problem: Education: Goal: Knowledge of disease or condition will improve Outcome: Progressing   Problem: Safety: Goal: Ability to remain free from injury will improve Outcome: Progressing   Problem: Education: Goal: Will be free of psychotic symptoms Outcome: Progressing   Problem: Coping: Goal: Will verbalize feelings Outcome: Progressing   Problem: Coping: Goal: Level of anxiety will decrease Outcome: Progressing

## 2017-09-29 NOTE — Progress Notes (Signed)
CSW received a call from pt's CST Lead, Earleen ReaperBarbara Clapp at [phone]. Britta MccreedyBarbara reported, "This is the thing with Olden-and I've been stressing this to him-smoking marijuana and taking his meds is not mixing well for him. On Thursday, one of the [facility] He kept questioning Apolinar JunesBrandon asking him what was wrong-he had a look on his face that he could go through you. He said it was the devil telling him to do this and do that. I can't get him to stop smoking marijuana. The last time he was discharged from the hospital, he asked me if he could stop taking his meds so he could smoke. He's been doing well living independently. Ever since he got his apartment, I've thought he was doing well. At times, he'll say something about wanting to go to a group home, but then he'll say he doesn't want to. I think it bothers him a lot that his family doesn't interact with him. I've reached out to them, but they're not receptive. I just hope he gets the help he needs." Britta MccreedyBarbara stated pt is able to return to his apartment and will not have access to weapons/other means of self harm upon discharge. CSW will continue to coordinate with Britta MccreedyBarbara as needed for updates/discharge planning.   Heidi DachKelsey Lerae Langham, MSW, LCSW Clinical Social Worker 09/29/2017 10:42 AM

## 2017-09-29 NOTE — Progress Notes (Signed)
[facility] MD Progress Note  09/29/2017 3:08 PM Anthony Luna  MRN:  [id number] Subjective:  Pt states that he is feeling better today. He states that his mood is better. He feels his new medications are helpful. When asked what they are helpful with, he states, "anxiety, sleep, depression." He states that he slept very well last night. He feels his appetite is improved. He still has some AH but they are quieter. He states that there is a male and male voice. He states that this morning they said to him "why are you so evil." he states that sometimes he is able to ignore the voices by staying busy. He has good reality testing and knows these voices are not real and he tries to give himself positive self talk which helps him overcome them. He is social with peers on the unit and playing cards this morning. He states that he is really going to try to stay away from marijuana and knows this can worsen his symptom. He goes to NA meetings 3 times a week and also has sponsor. He enjoys seeing his peer support, Tammy Sours. Pt appears somewhat paranoid at times and looking out the door window. His affect is improved and appears to be in a better mood. He is organized and goal directed.   Principal Problem: <principal problem not specified> Diagnosis:   Patient Active Problem List   Diagnosis Date Noted  . Schizoaffective disorder (HCC) [F25.9] 09/25/2017  . Cannabis use disorder, severe, dependence (HCC) [F12.20] 11/30/2014  . Schizoaffective disorder, bipolar type (HCC) [F25.0] 11/30/2014  . Tobacco use disorder [F17.200] 11/30/2014   Total Time spent with patient: 20 minutes  Past Psychiatric History: See H&p  Past Medical History:  Past Medical History:  Diagnosis Date  . Depression    Since moving into new apartment 1 year ago  . None to low serum cortisol response with adrenocorticotrophic hormone (ACTH) stimulation test   . Schizophrenia, paranoid type (HCC)    History reviewed. No pertinent  surgical history. Family History: History reviewed. No pertinent family history. Family Psychiatric  History: See h&P Social History:  Social History   Substance and Sexual Activity  Alcohol Use Yes  . Alcohol/week: 0.0 oz   Comment: "Special occasions"     Social History   Substance and Sexual Activity  Drug Use Yes  . Frequency: 4.0 times per week  . Types: Marijuana   Comment: "2 blunts"    Social History   Socioeconomic History  . Marital status: Single    Spouse name: Not on file  . Number of children: Not on file  . Years of education: Not on file  . Highest education level: Not on file  Occupational History  . Not on file  Social Needs  . Financial resource strain: Not on file  . Food insecurity:    Worry: Not on file    Inability: Not on file  . Transportation needs:    Medical: Not on file    Non-medical: Not on file  Tobacco Use  . Smoking status: Current Every Day Smoker    Packs/day: 0.50    Types: Cigarettes    Start date: 11/29/2002  . Smokeless tobacco: Former Neurosurgeon  . Tobacco comment: Patient wishes to quit today.  Substance and Sexual Activity  . Alcohol use: Yes    Alcohol/week: 0.0 oz    Comment: "Special occasions"  . Drug use: Yes    Frequency: 4.0 times per week    Types:  Marijuana    Comment: "2 blunts"  . Sexual activity: Not on file  Lifestyle  . Physical activity:    Days per week: Not on file    Minutes per session: Not on file  . Stress: Not on file  Relationships  . Social connections:    Talks on phone: Not on file    Gets together: Not on file    Attends religious service: Not on file    Active member of club or organization: Not on file    Attends meetings of clubs or organizations: Not on file    Relationship status: Not on file  Other Topics Concern  . Not on file  Social History Narrative  . Not on file   Additional Social History:    Pain Medications: See PTA Prescriptions: See PTA Over the Counter: See  PTA History of alcohol / drug use?: Yes Negative Consequences of Use: Financial, Personal relationships, Work / School Name of Substance 1: Alcohol 1 - Amount (size/oz): 2-3 Drinks 1 - Frequency: "Special occasions" 1 - Last Use / Amount: Last night (4/11)                  Sleep: Good  Appetite:  Good  Current Medications: Current Facility-Administered Medications  Medication Dose Route Frequency Provider Last Rate Last Dose  . acetaminophen (TYLENOL) tablet 650 mg  650 mg Oral Q6H PRN [name], [name], MD      . alum & mag hydroxide-simeth (MAALOX/MYLANTA) 200-200-20 MG/5ML suspension 30 mL  30 mL Oral Q4H PRN [name], [name], MD      . amantadine (SYMMETREL) capsule 100 mg  100 mg Oral BID [name], [name], MD   100 mg at 09/29/17 0815  . benztropine (COGENTIN) tablet 1 mg  1 mg Oral BID Jovante Hammitt, Ileene HutchinsonHolly R, MD   1 mg at 09/29/17 0815  . hydrOXYzine (ATARAX/VISTARIL) tablet 50 mg  50 mg Oral TID PRN [name], [name], MD      . lithium carbonate (ESKALITH) CR tablet 450 mg  450 mg Oral Q12H [name], [name], MD   450 mg at 09/29/17 0815  . magnesium hydroxide (MILK OF MAGNESIA) suspension 30 mL  30 mL Oral Daily PRN [name], [name], MD      . nicotine (NICODERM CQ - dosed in mg/24 hours) patch 21 mg  21 mg Transdermal Daily [name], Jagannath, MD   21 mg at 09/28/17 0829  . OLANZapine (ZYPREXA) tablet 10 mg  10 mg Oral QHS Daianna Vasques, Ileene HutchinsonHolly R, MD   10 mg at 09/28/17 2115  . propranolol (INDERAL) tablet 10 mg  10 mg Oral TID [name], [name], MD   10 mg at 09/29/17 1135  . traZODone (DESYREL) tablet 150 mg  150 mg Oral QHS PRN [name], Jagannath, MD        Lab Results: No results found for this or any previous visit (from the past 48 hour(s)).  Blood Alcohol level:  Lab Results  Component Value Date   ETH <10 09/25/2017   ETH <10 04/07/2017    Metabolic Disorder Labs: Lab Results  Component Value Date   HGBA1C 4.7 (L) 09/26/2017   MPG 88.19 09/26/2017   MPG 105.41 04/08/2017   Lab  Results  Component Value Date   PROLACTIN 6.1 12/19/2014   PROLACTIN 1.3 (L) 11/30/2014   Lab Results  Component Value Date   CHOL 153 09/26/2017   TRIG 64 09/26/2017   HDL 55 09/26/2017   CHOLHDL 2.8  09/26/2017   VLDL 13 09/26/2017   LDLCALC 85 09/26/2017   LDLCALC 95 04/08/2017    Physical Findings: AIMS: Facial and Oral Movements Muscles of Facial Expression: None, normal Lips and Perioral Area: None, normal Jaw: None, normal Tongue: None, normal,Extremity Movements Upper (arms, wrists, hands, fingers): None, normal Lower (legs, knees, ankles, toes): None, normal, Trunk Movements Neck, shoulders, hips: None, normal, Overall Severity Severity of abnormal movements (highest score from questions above): None, normal Incapacitation due to abnormal movements: None, normal Patient's awareness of abnormal movements (rate only patient's report): No Awareness, Dental Status Current problems with teeth and/or dentures?: No Does patient usually wear dentures?: No  CIWA:  CIWA-Ar Total: 1 COWS:  COWS Total Score: 1  Musculoskeletal: Strength & Muscle Tone: within normal limits Gait & Station: normal Patient leans: N/A  Psychiatric Specialty Exam: Physical Exam  Nursing note and vitals reviewed.   Review of Systems  All other systems reviewed and are negative.   Blood pressure 105/77, pulse 81, temperature 97.9 F (36.6 C), temperature source Oral, resp. rate 16, height 6' (1.829 m), weight 85.7 kg (189 lb), SpO2 100 %.Body mass index is 25.63 kg/m.  General Appearance: Casual  Eye Contact:  Good  Speech:  Clear and Coherent  Volume:  Normal  Mood:  Euthymic  Affect:  Appropriate  Thought Process:  Coherent and Goal Directed  Orientation:  Full (Time, Place, and Person)  Thought Content:  Logical  Suicidal Thoughts:  No  Homicidal Thoughts:  No  Memory:  Immediate;   Fair  Judgement:  Fair  Insight:  Fair  Psychomotor Activity:  Normal  Concentration:   Concentration: Fair  Recall:  [employer] of Knowledge:  Fair  Language:  Fair  Akathisia:  No      Assets:  Resilience  ADL's:  Intact  Cognition:  WNL  Sleep:  Number of Hours: 8.15     Treatment Plan Summary: 32 yo male admitted due to worsening AH. He was transitioned from Prolixin to Zyprexa. He feels his medications is helping him. AH are quieter and he is sleeping better. He feels less restless. He asked appropriate questions about his lab work and is happy to know most of it is WNL. He asks for an HIV test. He is organized and goal directed. He still has some AH that say derogatory things at times but has good reality testing.   Plan:  Schizoaffective disorder -He is on [place]. Last dose given on 09/18/17 -Increase Zyprexa to 15 mg qhs for AH -Continue propranolol for akathisia -Continue Symmetrel for tremor -Continue Cogentin 1 mg BID -Continue Lithium 450 mg BID  Marijuana use disorder -Continue to advise abstaining from marijuana use. HE plans to continue NA meetings  Dispo He will return to his apartment on discharge.    [name], MD 09/29/2017, 3:08 PM

## 2017-09-30 LAB — CBC WITH DIFFERENTIAL/PLATELET
BASOS PCT: 1 %
Basophils Absolute: 0 10*3/uL (ref 0–0.1)
Eosinophils Absolute: 0.4 10*3/uL (ref 0–0.7)
Eosinophils Relative: 4 %
HEMATOCRIT: 47.9 % (ref 40.0–52.0)
HEMOGLOBIN: 16.2 g/dL (ref 13.0–18.0)
LYMPHS ABS: 1.6 10*3/uL (ref 1.0–3.6)
Lymphocytes Relative: 19 %
MCH: 31.4 pg (ref 26.0–34.0)
MCHC: 33.8 g/dL (ref 32.0–36.0)
MCV: 92.8 fL (ref 80.0–100.0)
MONO ABS: 2 10*3/uL — AB (ref 0.2–1.0)
MONOS PCT: 24 %
NEUTROS ABS: 4.3 10*3/uL (ref 1.4–6.5)
Neutrophils Relative %: 52 %
Platelets: 197 10*3/uL (ref 150–440)
RBC: 5.17 MIL/uL (ref 4.40–5.90)
RDW: 13.9 % (ref 11.5–14.5)
WBC: 8.3 10*3/uL (ref 3.8–10.6)

## 2017-09-30 MED ORDER — TRAZODONE HCL 100 MG PO TABS
100.0000 mg | ORAL_TABLET | Freq: Every evening | ORAL | Status: DC | PRN
  Administered 2017-09-30 – 2017-10-08 (×6): 100 mg via ORAL
  Filled 2017-09-30 (×6): qty 1

## 2017-09-30 MED ORDER — LORAZEPAM 1 MG PO TABS
1.0000 mg | ORAL_TABLET | Freq: Four times a day (QID) | ORAL | Status: DC | PRN
  Administered 2017-10-03 – 2017-10-07 (×3): 1 mg via ORAL
  Filled 2017-09-30 (×3): qty 1

## 2017-09-30 MED ORDER — TEMAZEPAM 15 MG PO CAPS
15.0000 mg | ORAL_CAPSULE | Freq: Every day | ORAL | Status: DC
  Administered 2017-09-30 – 2017-10-01 (×2): 15 mg via ORAL
  Filled 2017-09-30 (×2): qty 1

## 2017-09-30 MED ORDER — LORAZEPAM 1 MG PO TABS: 1.0000 mg | ORAL_TABLET | ORAL | Status: DC | PRN

## 2017-09-30 MED ORDER — CLOZAPINE 25 MG PO TABS
12.5000 mg | ORAL_TABLET | Freq: Two times a day (BID) | ORAL | Status: DC
  Administered 2017-09-30 – 2017-10-01 (×2): 12.5 mg via ORAL
  Filled 2017-09-30 (×2): qty 1

## 2017-09-30 MED ORDER — OLANZAPINE 5 MG PO TABS: 15.0000 mg | ORAL_TABLET | Freq: Every day | ORAL | Status: DC

## 2017-09-30 NOTE — [facility] Group Notes (Signed)
LCSW Group Therapy Note  09/30/2017 1:00 pm  Type of Therapy/Topic:  Group Therapy:  Emotion Regulation  Participation Level:  Minimal   Description of Group:    The purpose of this group is to assist patients in learning to regulate negative emotions and experience positive emotions. Patients will be guided to discuss ways in which they have been vulnerable to their negative emotions. These vulnerabilities will be juxtaposed with experiences of positive emotions or situations, and patients will be challenged to use positive emotions to combat negative ones. Special emphasis will be placed on coping with negative emotions in conflict situations, and patients will process healthy conflict resolution skills.  Therapeutic Goals: 1. Patient will identify two positive emotions or experiences to reflect on in order to balance out negative emotions 2. Patient will label two or more emotions that they find the most difficult to experience 3. Patient will demonstrate positive conflict resolution skills through discussion and/or role plays  Summary of Patient Progress:  Anthony Luna was able to participate somewhat in today's group.  He shared that the most difficult emotion that he experiences almost on a daily basis is anxiousness.  Anthony Luna shared that he wanted other group participants to "pray for me as I'm going through".  Anthony Luna shared that he has felt hopeless as well and that he often uses listening to music as a way to work through internal conflict and negative emotions.     Therapeutic Modalities:   Cognitive Behavioral Therapy Feelings Identification Dialectical Behavioral Therapy

## 2017-09-30 NOTE — Progress Notes (Signed)
[facility] MD Progress Note  09/30/2017 11:25 AM Anthony Luna  MRN:  [id number]   Subjective:  Per RN staff, pt only slept about 1 hour last night. He was starting at them and responding to internal stimuli. Today, pt is standing in hallway staring and not interacting to anyone. He appears very confused today and appears to be responding to internal stimuli. He has significant delay in responses and is staring at the floor. He states taht he wants to go home but not able to give any details on a plan when he leaves. He is confused about his medications. He asks why he was taken off Prolixin. (We had extensive conversation about this yesterday). He states that he does not want to get back on it. He states that his nurse "gave him Invega last night." Discussed that he is on Invega injection but he is on Zyprexa oral medication. He states that he feels like it is making things worse but can't elaborate on this. He denies SI or HI. He shakes his head yes in response to hearing voices but not able to elaborate.   Principal Problem: <principal problem not specified> Diagnosis:   Patient Active Problem List   Diagnosis Date Noted  . Schizoaffective disorder (HCC) [F25.9] 09/25/2017  . Cannabis use disorder, severe, dependence (HCC) [F12.20] 11/30/2014  . Schizoaffective disorder, bipolar type (HCC) [F25.0] 11/30/2014  . Tobacco use disorder [F17.200] 11/30/2014   Total Time spent with patient: 20 minutes  Past Psychiatric History: See h&P  Past Medical History:  Past Medical History:  Diagnosis Date  . Depression    Since moving into new apartment 1 year ago  . None to low serum cortisol response with adrenocorticotrophic hormone (ACTH) stimulation test   . Schizophrenia, paranoid type (HCC)    History reviewed. No pertinent surgical history. Family History: History reviewed. No pertinent family history. Family Psychiatric  History: See H&P Social History:  Social History   Substance and  Sexual Activity  Alcohol Use Yes  . Alcohol/week: 0.0 oz   Comment: "Special occasions"     Social History   Substance and Sexual Activity  Drug Use Yes  . Frequency: 4.0 times per week  . Types: Marijuana   Comment: "2 blunts"    Social History   Socioeconomic History  . Marital status: Single    Spouse name: Not on file  . Number of children: Not on file  . Years of education: Not on file  . Highest education level: Not on file  Occupational History  . Not on file  Social Needs  . Financial resource strain: Not on file  . Food insecurity:    Worry: Not on file    Inability: Not on file  . Transportation needs:    Medical: Not on file    Non-medical: Not on file  Tobacco Use  . Smoking status: Current Every Day Smoker    Packs/day: 0.50    Types: Cigarettes    Start date: 11/29/2002  . Smokeless tobacco: Former Neurosurgeon  . Tobacco comment: Patient wishes to quit today.  Substance and Sexual Activity  . Alcohol use: Yes    Alcohol/week: 0.0 oz    Comment: "Special occasions"  . Drug use: Yes    Frequency: 4.0 times per week    Types: Marijuana    Comment: "2 blunts"  . Sexual activity: Not on file  Lifestyle  . Physical activity:    Days per week: Not on file  Minutes per session: Not on file  . Stress: Not on file  Relationships  . Social connections:    Talks on phone: Not on file    Gets together: Not on file    Attends religious service: Not on file    Active member of club or organization: Not on file    Attends meetings of clubs or organizations: Not on file    Relationship status: Not on file  Other Topics Concern  . Not on file  Social History Narrative  . Not on file   Additional Social History:    Pain Medications: See PTA Prescriptions: See PTA Over the Counter: See PTA History of alcohol / drug use?: Yes Negative Consequences of Use: Financial, Personal relationships, Work / School Name of Substance 1: Alcohol 1 - Amount (size/oz): 2-3  Drinks 1 - Frequency: "Special occasions" 1 - Last Use / Amount: Last night (4/11)                  Sleep: Poor  Appetite:  Poor  Current Medications: Current Facility-Administered Medications  Medication Dose Route Frequency Provider Last Rate Last Dose  . acetaminophen (TYLENOL) tablet 650 mg  650 mg Oral Q6H PRN [name], [name], MD      . alum & mag hydroxide-simeth (MAALOX/MYLANTA) 200-200-20 MG/5ML suspension 30 mL  30 mL Oral Q4H PRN [name], [name], MD      . amantadine (SYMMETREL) capsule 100 mg  100 mg Oral BID [name], [name], MD   100 mg at 09/30/17 0827  . lithium carbonate (ESKALITH) CR tablet 450 mg  450 mg Oral Q12H [name], [name], MD   450 mg at 09/30/17 0827  . LORazepam (ATIVAN) tablet 1 mg  1 mg Oral Q6H PRN Gladis Soley, [name], MD      . magnesium hydroxide (MILK OF MAGNESIA) suspension 30 mL  30 mL Oral Daily PRN [name], [name], MD      . nicotine (NICODERM CQ - dosed in mg/24 hours) patch 21 mg  21 mg Transdermal Daily Beverly Sessions, MD   21 mg at 09/30/17 0826  . OLANZapine (ZYPREXA) tablet 15 mg  15 mg Oral QHS Issaic Welliver R, MD      . propranolol (INDERAL) tablet 10 mg  10 mg Oral TID [name], [name], MD   10 mg at 09/30/17 0826  . temazepam (RESTORIL) capsule 15 mg  15 mg Oral QHS Durene Dodge R, MD      . traZODone (DESYREL) tablet 100 mg  100 mg Oral QHS PRN Tyresse Jayson, [name], MD        Lab Results: No results found for this or any previous visit (from the past 48 hour(s)).  Blood Alcohol level:  Lab Results  Component Value Date   ETH <10 09/25/2017   ETH <10 04/07/2017    Metabolic Disorder Labs: Lab Results  Component Value Date   HGBA1C 4.7 (L) 09/26/2017   MPG 88.19 09/26/2017   MPG 105.41 04/08/2017   Lab Results  Component Value Date   PROLACTIN 6.1 12/19/2014   PROLACTIN 1.3 (L) 11/30/2014   Lab Results  Component Value Date   CHOL 153 09/26/2017   TRIG 64 09/26/2017   HDL 55 09/26/2017   CHOLHDL 2.8 09/26/2017   VLDL 13  09/26/2017   LDLCALC 85 09/26/2017   LDLCALC 95 04/08/2017    Physical Findings: AIMS: Facial and Oral Movements Muscles of Facial Expression: None, normal Lips and Perioral Area: None,  normal Jaw: None, normal Tongue: None, normal,Extremity Movements Upper (arms, wrists, hands, fingers): None, normal Lower (legs, knees, ankles, toes): None, normal, Trunk Movements Neck, shoulders, hips: None, normal, Overall Severity Severity of abnormal movements (highest score from questions above): None, normal Incapacitation due to abnormal movements: None, normal Patient's awareness of abnormal movements (rate only patient's report): No Awareness, Dental Status Current problems with teeth and/or dentures?: No Does patient usually wear dentures?: No  CIWA:  CIWA-Ar Total: 1 COWS:  COWS Total Score: 1  Musculoskeletal: Strength & Muscle Tone: within normal limits Gait & Station: normal Patient leans: N/A  Psychiatric Specialty Exam: Physical Exam  Nursing note and vitals reviewed.   ROS  Blood pressure 113/73, pulse 72, temperature (!) 97.5 F (36.4 C), temperature source Oral, resp. rate 16, height 6' (1.829 m), weight 85.7 kg (189 lb), SpO2 99 %.Body mass index is 25.63 kg/m.  General Appearance: Disheveled  Eye Contact:  Poor  Speech:  Slow  Volume:  Decreased  Mood:  Dysphoric and Irritable  Affect:  Flat  Thought Process:  Organized but poverty of speech  Orientation:  Other:  Pt does not answer questions  Thought Content:  Hallucinations: Auditory and Paranoid Ideation  Suicidal Thoughts:  No  Homicidal Thoughts:  No  Memory:  Immediate;   Fair  Judgement:  Impaired  Insight:  Lacking  Psychomotor Activity:  Decreased  Concentration:  Concentration: Poor  Recall:  Poor  Fund of Knowledge:  Poor  Language:  Poor  Akathisia:  Yes      Assets:  Resilience  ADL's:  Impaired  Cognition:  Impaired,  Moderate  Sleep:  Number of Hours: 1.45     Treatment Plan Summary:  32 yo male admitted due to psychosis. He is much worse today. HE did not sleep at all last night. He has significantly delay in responses, very flat affect, no eye contact, no interaction with peers. I have attempted to Call Dr. Omelia BlackwaterHEaden his outpatient psychiatrist to discuss medication changes but he is on vacation until next weekend. Pt agrees to start Clozapine.   Plan:  Schizoaffective disorder -d/c Zyprexa -Start Clozapine 12.5 mg BID.WBC on 4/12 was 7.3. Will add on CBC to this mornings collection -He is on [place] Trin[place]. Last dose given on 09/18/17 -Stop Cogentin as he is also on Symmetrel -Continue propranolol for akathisia -Continue Lithium 450 mg BID  Marijuana use disorder -Continue to advise abstaining from marijuana use. HE plans to continue NA meetings  Dispo -He will return to his apartment on discharge. He will follow up with his CST team. I attempted to contact Dr. Omelia BlackwaterHEaden to discuss initiation of Clozapine but he is on vacation until next weekend.   Haskell RilingHolly R Nayshawn Mesta, MD 09/30/2017, 11:25 AM

## 2017-09-30 NOTE — Progress Notes (Signed)
Recreation Therapy Notes  Date: 09/30/2017  Time: 9:30 am  Location: Craft Room  Behavioral response: Appropriate  Intervention Topic: Communication Discussion/Intervention:  Group content today was focused on communication. The group defined communication and ways to communicate with others. Individuals stated reason why communication is important and some reasons to communicate with others. Patients expressed if they thought they were good at communicating with others and ways they could improve their communication skills. The group identified important parts of communication and some experiences they have had in the past with communication. The group participated in the intervention "Board Scramble", where patients had a chance to test out their communication skills and identify ways to improve their communication techniques.  Clinical Observations/Feedback:  Patient came to group and was focused on what peers and staff had to say about communication. Individuals participated in the intervention and was social with peers and staff during group.  Daxten Kovalenko LRT/CTRS         Brittin Belnap 09/30/2017 11:19 AM

## 2017-09-30 NOTE — Progress Notes (Signed)
Clozapine monitoring Consult   32 yo male ordered clozapine 12.5 mg PO bid  04/17 ANC 4300  Information entered into Clozapine registry and pt eligible to receive clozapine Next labs due in a week - ordered for 04/24  Pharmacy will continue to follow.   [place]ron LabsKaren K Velora Horstman, RPh 09/30/2017  1525

## 2017-09-30 NOTE — Progress Notes (Signed)
Received Anthony Luna this AM after breakfast, he was compliant with his medications. He denied all of the psychiatric symptoms except anxiety. He spent approximately one hour pacing in the hallway near the dayroom. He refused Vistaril. He eventually was able to socialize with one of his peers resulting in his affect being brighter. This PM he was noted playing cards with his peers in the dayroom.

## 2017-09-30 NOTE — Plan of Care (Signed)
Patient is improving in all areas. Problem: Education: Goal: Utilization of techniques to improve thought processes will improve Outcome: Progressing Goal: Knowledge of the prescribed therapeutic regimen will improve Outcome: Progressing   Problem: Coping: Goal: Coping ability will improve Outcome: Progressing Goal: Will verbalize feelings Outcome: Progressing   Problem: Self-Concept: Goal: Level of anxiety will decrease Outcome: Progressing   Problem: Education: Goal: Knowledge of disease or condition will improve Outcome: Progressing   Problem: Safety: Goal: Ability to remain free from injury will improve Outcome: Progressing   Problem: Education: Goal: Will be free of psychotic symptoms Outcome: Progressing   Problem: Coping: Goal: Will verbalize feelings Outcome: Progressing   Problem: Coping: Goal: Level of anxiety will decrease Outcome: Progressing

## 2017-09-30 NOTE — Progress Notes (Signed)
Patient is responding to internal stimuli, requiring some nursing interventions, but patient constantly refusing all attempts to give him PRN medications, he stands in front of his door and stirring at people with out saying a thing, then he will start going in circles several times in a ritualistic manner but not threatening any body. Patient is monitored for safety

## 2017-10-01 LAB — CBC WITH DIFFERENTIAL/PLATELET
Basophils Absolute: 0 10*3/uL (ref 0–0.1)
Basophils Relative: 1 %
EOS PCT: 5 %
Eosinophils Absolute: 0.3 10*3/uL (ref 0–0.7)
HCT: 48.1 % (ref 40.0–52.0)
Hemoglobin: 16.5 g/dL (ref 13.0–18.0)
LYMPHS ABS: 1.3 10*3/uL (ref 1.0–3.6)
LYMPHS PCT: 20 %
MCH: 31.8 pg (ref 26.0–34.0)
MCHC: 34.3 g/dL (ref 32.0–36.0)
MCV: 92.9 fL (ref 80.0–100.0)
MONO ABS: 1.5 10*3/uL — AB (ref 0.2–1.0)
Monocytes Relative: 22 %
Neutro Abs: 3.5 10*3/uL (ref 1.4–6.5)
Neutrophils Relative %: 52 %
PLATELETS: 188 10*3/uL (ref 150–440)
RBC: 5.18 MIL/uL (ref 4.40–5.90)
RDW: 13.7 % (ref 11.5–14.5)
WBC: 6.6 10*3/uL (ref 3.8–10.6)

## 2017-10-01 LAB — HIV ANTIBODY (ROUTINE TESTING W REFLEX): HIV SCREEN 4TH GENERATION: NONREACTIVE

## 2017-10-01 MED ORDER — CLOZAPINE 25 MG PO TABS
25.0000 mg | ORAL_TABLET | Freq: Two times a day (BID) | ORAL | Status: DC
  Administered 2017-10-01 – 2017-10-02 (×2): 25 mg via ORAL
  Filled 2017-10-01 (×2): qty 1

## 2017-10-01 NOTE — Progress Notes (Signed)
Recreation Therapy Notes  Date: 10/01/2017  Time: 9:30 am  Location: Craft Room  Behavioral response: Appropriate  Intervention Topic: Creative Expressions  Discussion/Intervention:  Group content on today was focused on creative expressions. The group defined creative expressions and ways they use creative expressions. Individual identified other positive ways creative expressions can be used and why it is important to express yourself. Patients participated in the intervention "expressive painting", where they had a chance to creatively express themselves.  Clinical Observations/Feedback:  Patient came to group and stated he meditates and plays sports to creatively express himself. Individual participated in the intervention during group.   Mansfield Dann LRT/CTRS          Agamjot Kilgallon 10/01/2017 11:42 AM

## 2017-10-01 NOTE — Progress Notes (Signed)
Received Anthony Luna this AM before breakfast, he was sitting in a chair in the dinning room with his eyes close. Upon my introduction he looked at this writer but did not acknowledge. When I approached him for medication administration he did not respond nor look my way. Later he arrived at the medication room for his medications. He has had episodes of pacing with a blank face and at other times socializing with his peers. He endorsed hearing voices at intervals this PM.

## 2017-10-01 NOTE — [facility] Group Notes (Signed)
10/01/2017  Time: 1PM  Type of Therapy/Topic:  Group Therapy:  Balance in Life  Participation Level:  Active  Description of Group:   This group will address the concept of balance and how it feels and looks when one is unbalanced. Patients will be encouraged to process areas in their lives that are out of balance and identify reasons for remaining unbalanced. Facilitators will guide patients in utilizing problem-solving interventions to address and correct the stressor making their life unbalanced. Understanding and applying boundaries will be explored and addressed for obtaining and maintaining a balanced life. Patients will be encouraged to explore ways to assertively make their unbalanced needs known to significant others in their lives, using other group members and facilitator for support and feedback.  Therapeutic Goals: 1. Patient will identify two or more emotions or situations they have that consume much of in their lives. 2. Patient will identify signs/triggers that life has become out of balance:  3. Patient will identify two ways to set boundaries in order to achieve balance in their lives:  4. Patient will demonstrate ability to communicate their needs through discussion and/or role plays  Summary of Patient Progress: Pt continues to work towards their tx goals but has not yet reached them. Pt was able to appropriately participate in group discussion, and was able to offer support/validation to other group members. Pt reported he is feeling, "good today because I'm alive." Pt reported one area of his life he thinks takes up too much of his energy is, "my physical health." Pt reported one area of his life he would like to devote more attention to is, "setting boundaries."  Therapeutic Modalities:   Cognitive Behavioral Therapy Solution-Focused Therapy Assertiveness Training  Heidi DachKelsey Melea Prezioso, MSW, LCSW Clinical Social Worker 10/01/2017 1:58 PM

## 2017-10-01 NOTE — Progress Notes (Signed)
[facility] MD Progress Note  10/01/2017 2:41 PM Anthony Luna  MRN:  [id number]   Subjective:  Pt is seen standing in the milieu staring and not interacting with anyone. After yesterdays interview, he waved me down later and apologized for his behaviors. HE stated that he was upset and didn't want to talk at that time. He had much brighter affect at that time. Today, he has similar prevention to initial interview yesterday. He is staring at the ground and not saying much. He has significant delay in responses. He is responding to internal stimuli but shakes his head no to hearing voices. He then states, "they are good voices." However, he is not able to elaborate on this. He states that people are irritating him here. He reports sleeping better last night. He does not respond when asked about suicidal thoughts. He stares at the ground. He is fully oriented and able to answer orientation questions. He does state that he feels the medications are helping with the voices.   Principal Problem: <principal problem not specified> Diagnosis:   Patient Active Problem List   Diagnosis Date Noted  . Schizoaffective disorder (HCC) [F25.9] 09/25/2017  . Cannabis use disorder, severe, dependence (HCC) [F12.20] 11/30/2014  . Schizoaffective disorder, bipolar type (HCC) [F25.0] 11/30/2014  . Tobacco use disorder [F17.200] 11/30/2014   Total Time spent with patient: 15 minutes  Past Psychiatric History: See H&p  Past Medical History:  Past Medical History:  Diagnosis Date  . Depression    Since moving into new apartment 1 year ago  . None to low serum cortisol response with adrenocorticotrophic hormone (ACTH) stimulation test   . Schizophrenia, paranoid type (HCC)    History reviewed. No pertinent surgical history. Family History: History reviewed. No pertinent family history. Family Psychiatric  History: See h&P Social History:  Social History   Substance and Sexual Activity  Alcohol Use Yes  .  Alcohol/week: 0.0 oz   Comment: "Special occasions"     Social History   Substance and Sexual Activity  Drug Use Yes  . Frequency: 4.0 times per week  . Types: Marijuana   Comment: "2 blunts"    Social History   Socioeconomic History  . Marital status: Single    Spouse name: Not on file  . Number of children: Not on file  . Years of education: Not on file  . Highest education level: Not on file  Occupational History  . Not on file  Social Needs  . Financial resource strain: Not on file  . Food insecurity:    Worry: Not on file    Inability: Not on file  . Transportation needs:    Medical: Not on file    Non-medical: Not on file  Tobacco Use  . Smoking status: Current Every Day Smoker    Packs/day: 0.50    Types: Cigarettes    Start date: 11/29/2002  . Smokeless tobacco: Former Neurosurgeon  . Tobacco comment: Patient wishes to quit today.  Substance and Sexual Activity  . Alcohol use: Yes    Alcohol/week: 0.0 oz    Comment: "Special occasions"  . Drug use: Yes    Frequency: 4.0 times per week    Types: Marijuana    Comment: "2 blunts"  . Sexual activity: Not on file  Lifestyle  . Physical activity:    Days per week: Not on file    Minutes per session: Not on file  . Stress: Not on file  Relationships  . Social connections:  Talks on phone: Not on file    Gets together: Not on file    Attends religious service: Not on file    Active member of club or organization: Not on file    Attends meetings of clubs or organizations: Not on file    Relationship status: Not on file  Other Topics Concern  . Not on file  Social History Narrative  . Not on file   Additional Social History:    Pain Medications: See PTA Prescriptions: See PTA Over the Counter: See PTA History of alcohol / drug use?: Yes Negative Consequences of Use: Financial, Personal relationships, Work / School Name of Substance 1: Alcohol 1 - Amount (size/oz): 2-3 Drinks 1 - Frequency: "Special  occasions" 1 - Last Use / Amount: Last night (4/11)                  Sleep: Fair  Appetite:  Fair  Current Medications: Current Facility-Administered Medications  Medication Dose Route Frequency Provider Last Rate Last Dose  . acetaminophen (TYLENOL) tablet 650 mg  650 mg Oral Q6H PRN [name], [name], MD      . alum & mag hydroxide-simeth (MAALOX/MYLANTA) 200-200-20 MG/5ML suspension 30 mL  30 mL Oral Q4H PRN [name], [name], MD      . amantadine (SYMMETREL) capsule 100 mg  100 mg Oral BID [name], [name], MD   100 mg at 10/01/17 [id number]  . cloZAPine (CLOZARIL) tablet 25 mg  25 mg Oral BID Alexandru Moorer R, MD      . lithium carbonate (ESKALITH) CR tablet 450 mg  450 mg Oral Q12H [name], [name], MD   450 mg at 10/01/17 [id number]  . LORazepam (ATIVAN) tablet 1 mg  1 mg Oral Q6H PRN Tamario Heal, Ileene HutchinsonHolly R, MD      . magnesium hydroxide (MILK OF MAGNESIA) suspension 30 mL  30 mL Oral Daily PRN [name], [name], MD      . nicotine (NICODERM CQ - dosed in mg/24 hours) patch 21 mg  21 mg Transdermal Daily [name], Jagannath, MD   21 mg at 10/01/17 0835  . propranolol (INDERAL) tablet 10 mg  10 mg Oral TID [name], [name], MD   10 mg at 10/01/17 1136  . temazepam (RESTORIL) capsule 15 mg  15 mg Oral QHS Faithlynn Deeley, Ileene HutchinsonHolly R, MD   15 mg at 09/30/17 2145  . traZODone (DESYREL) tablet 100 mg  100 mg Oral QHS PRN Haskell RilingMcNew, Trudy Kory R, MD   100 mg at 09/30/17 2145    Lab Results:  Results for orders placed or performed during the hospital encounter of 09/25/17 (from the past 48 hour(s))  HIV antibody     Status: None   Collection Time: 09/30/17  6:25 AM  Result Value Ref Range   HIV Screen 4th Generation wRfx Non Reactive Non Reactive    Comment: (NOTE) Performed At: [facility] [address] [place], [place] [id number] Jolene SchimkeNagendra Sanjai MD [phone] Performed at [facility], [address] [place], [place] [address]   CBC with Differential/Platelet     Status: Abnormal   Collection Time:  09/30/17 12:13 PM  Result Value Ref Range   WBC 8.3 3.8 - 10.6 K/uL   RBC 5.17 4.40 - 5.90 MIL/uL   Hemoglobin 16.2 13.0 - 18.0 g/dL   HCT 69.647.9 29.540.0 - 28.452.0 %   MCV 92.8 80.0 - 100.0 fL   MCH 31.4 26.0 - 34.0 pg   MCHC 33.8 32.0 - 36.0  g/dL   RDW 65.7 84.6 - 96.2 %   Platelets 197 150 - 440 K/uL   Neutrophils Relative % 52 %   Neutro Abs 4.3 1.4 - 6.5 K/uL   Lymphocytes Relative 19 %   Lymphs Abs 1.6 1.0 - 3.6 K/uL   Monocytes Relative 24 %   Monocytes Absolute 2.0 (H) 0.2 - 1.0 K/uL   Eosinophils Relative 4 %   Eosinophils Absolute 0.4 0 - 0.7 K/uL   Basophils Relative 1 %   Basophils Absolute 0.0 0 - 0.1 K/uL    Comment: Performed at [facility], [address] [place], [place] [address]  CBC with Differential/Platelet     Status: Abnormal   Collection Time: 10/01/17  6:43 AM  Result Value Ref Range   WBC 6.6 3.8 - 10.6 K/uL   RBC 5.18 4.40 - 5.90 MIL/uL   Hemoglobin 16.5 13.0 - 18.0 g/dL   HCT 13.2 44.0 - 10.2 %   MCV 92.9 80.0 - 100.0 fL   MCH 31.8 26.0 - 34.0 pg   MCHC 34.3 32.0 - 36.0 g/dL   RDW 72.5 36.6 - 44.0 %   Platelets 188 150 - 440 K/uL   Neutrophils Relative % 52 %   Neutro Abs 3.5 1.4 - 6.5 K/uL   Lymphocytes Relative 20 %   Lymphs Abs 1.3 1.0 - 3.6 K/uL   Monocytes Relative 22 %   Monocytes Absolute 1.5 (H) 0.2 - 1.0 K/uL   Eosinophils Relative 5 %   Eosinophils Absolute 0.3 0 - 0.7 K/uL   Basophils Relative 1 %   Basophils Absolute 0.0 0 - 0.1 K/uL    Comment: Performed at [facility], [address] [place], [place] [address]    Blood Alcohol level:  Lab Results  Component Value Date   [facility] <10 09/25/2017   ETH <10 04/07/2017    Metabolic Disorder Labs: Lab Results  Component Value Date   HGBA1C 4.7 (L) 09/26/2017   MPG 88.19 09/26/2017   MPG 105.41 04/08/2017   Lab Results  Component Value Date   PROLACTIN 6.1 12/19/2014   PROLACTIN 1.3 (L) 11/30/2014   Lab Results  Component Value Date   CHOL 153 09/26/2017    TRIG 64 09/26/2017   HDL 55 09/26/2017   CHOLHDL 2.8 09/26/2017   VLDL 13 09/26/2017   LDLCALC 85 09/26/2017   LDLCALC 95 04/08/2017    Physical Findings: AIMS: Facial and Oral Movements Muscles of Facial Expression: None, normal Lips and Perioral Area: None, normal Jaw: None, normal Tongue: None, normal,Extremity Movements Upper (arms, wrists, hands, fingers): None, normal Lower (legs, knees, ankles, toes): None, normal, Trunk Movements Neck, shoulders, hips: None, normal, Overall Severity Severity of abnormal movements (highest score from questions above): None, normal Incapacitation due to abnormal movements: None, normal Patient's awareness of abnormal movements (rate only patient's report): No Awareness, Dental Status Current problems with teeth and/or dentures?: No Does patient usually wear dentures?: No  CIWA:  CIWA-Ar Total: 1 COWS:  COWS Total Score: 1  Musculoskeletal: Strength & Muscle Tone: within normal limits Gait & Station: normal Patient leans: N/A  Psychiatric Specialty Exam: Physical Exam  Nursing note and vitals reviewed.   Review of Systems  All other systems reviewed and are negative.   Blood pressure 122/71, pulse 71, temperature 97.8 F (36.6 C), temperature source Oral, resp. rate 18, height 6' (1.829 m), weight 85.7 kg (189 lb), SpO2 99 %.Body mass index is 25.63 kg/m.  General Appearance: Casual  Eye Contact:  Poor  Speech:  Slow  Volume:  Decreased  Mood:  Depressed  Affect:  Flat  Thought Process:  Significant delay in responses, thought blocking  Orientation:  Full (Time, Place, and Person)  Thought Content:  Hallucinations: Auditory  Suicidal Thoughts:  Pt does not respond to this question  Homicidal Thoughts:  No  Memory:  Immediate;   Poor  Judgement:  Impaired  Insight:  Lacking  Psychomotor Activity:  Decreased  Concentration:  Concentration: Poor  Recall:  Poor  Fund of Knowledge:  Poor  Language:  Fair  Akathisia:  No       Assets:  Resilience  ADL's:  Intact  Cognition:  Impaired,  Mild  Sleep:  Number of Hours: 7.15     Treatment Plan Summary: 32 yo male admitted due to worsening AH. His symptoms seem to wax and wane and at times is almost catatonic and others he has bright affect. HE has significant delay in responses today, appears paranoia, and staring at the floor. He was started on Clozapine last night and will continue this titration.   Plan:  Schizoaffective disorder  -Increase Clozapine to 25 mg BID. ANC 3.5. Monitor weekly -He is on [place] with last dose given 09/18/17 -Continue propranolol and symmetrel -Continue Lithium 450 mg BID  Marijuana use disorder -Continue to advise abstaining from marijuana use. HE plans to continue NA meetings  Dispo -He will return to his apartment on discharge. He will follow up with his CST team. I attempted to contact Dr. Omelia Blackwater to discuss initiation of Clozapine but he is on vacation until next weekend.    [name], MD 10/01/2017, 2:41 PM

## 2017-10-01 NOTE — Plan of Care (Signed)
Patient is calm this evening non threatening, in the unit, walk in circles refuse redirections, takes his medicines  And out in the milieu area, reminded of 15 minute safety rounding, no distress noted

## 2017-10-01 NOTE — Plan of Care (Signed)
  Problem: Education: Goal: Utilization of techniques to improve thought processes will improve 10/01/2017 0100 by Lelan PonsAriwodo, Leven Hoel, RN Outcome: Progressing 10/01/2017 0048 by Lelan PonsAriwodo, Renny Gunnarson, RN Outcome: Progressing Goal: Knowledge of the prescribed therapeutic regimen will improve 10/01/2017 0100 by Lelan PonsAriwodo, Breunna Nordmann, RN Outcome: Progressing 10/01/2017 0048 by Lelan PonsAriwodo, Willard Madrigal, RN Outcome: Progressing   Problem: Coping: Goal: Coping ability will improve 10/01/2017 0100 by Lelan PonsAriwodo, Joselle Deeds, RN Outcome: Progressing 10/01/2017 0048 by Lelan PonsAriwodo, Monicka Cyran, RN Outcome: Progressing Goal: Will verbalize feelings 10/01/2017 0100 by Lelan PonsAriwodo, Jaculin Rasmus, RN Outcome: Progressing 10/01/2017 0048 by Lelan PonsAriwodo, Makaley Storts, RN Outcome: Progressing   Problem: Self-Concept: Goal: Level of anxiety will decrease 10/01/2017 0100 by Lelan PonsAriwodo, Nabiha Planck, RN Outcome: Progressing 10/01/2017 0048 by Lelan PonsAriwodo, Kauri Garson, RN Outcome: Progressing   Problem: Education: Goal: Knowledge of disease or condition will improve 10/01/2017 0100 by Lelan PonsAriwodo, Shashwat Cleary, RN Outcome: Progressing 10/01/2017 0048 by Lelan PonsAriwodo, Johnavon Mcclafferty, RN Outcome: Progressing   Problem: Safety: Goal: Ability to remain free from injury will improve 10/01/2017 0100 by Lelan PonsAriwodo, Seerat Peaden, RN Outcome: Progressing 10/01/2017 0048 by Lelan PonsAriwodo, Herndon Grill, RN Outcome: Progressing   Problem: Education: Goal: Will be free of psychotic symptoms 10/01/2017 0100 by Lelan PonsAriwodo, Shakeila Pfarr, RN Outcome: Progressing 10/01/2017 0048 by Lelan PonsAriwodo, Caidence Kaseman, RN Outcome: Progressing   Problem: Coping: Goal: Will verbalize feelings 10/01/2017 0100 by Lelan PonsAriwodo, Jaydyn Bozzo, RN Outcome: Progressing 10/01/2017 0048 by Lelan PonsAriwodo, Kamill Fulbright, RN Outcome: Progressing   Problem: Coping: Goal: Level of anxiety will decrease 10/01/2017 0100 by Lelan PonsAriwodo, Ceylin Dreibelbis, RN Outcome: Progressing 10/01/2017 0048 by Lelan PonsAriwodo, Daryana Whirley, RN Outcome: Progressing

## 2017-10-01 NOTE — [facility] Group Notes (Signed)
[facility] Group Notes:  (Nursing/MHT/Case Management/Adjunct)  Date:  10/01/2017  Time:  11:00 PM  Type of Therapy:  Group Therapy  Participation Level:  Minimal  Participation Quality:  Came in late.  Affect:  Appropriate  Cognitive:  Alert  Insight:  Good  Engagement in Group:  Engaged  Modes of Intervention:  Support  Summary of Progress/Problems:  Mayra NeerJackie L Meyli Boice 10/01/2017, 11:00 PM

## 2017-10-01 NOTE — [facility] Group Notes (Signed)
LCSW Group Therapy Note 10/01/2017 9:00 AM  Type of Therapy and Topic:  Group Therapy:  Setting Goals  Participation Level:  Active  Description of Group: In this process group, patients discussed using strengths to work toward goals and address challenges.  Patients identified two positive things about themselves and one goal they were working on.  Patients were given the opportunity to share openly and support each other's plan for self-empowerment.  The group discussed the value of gratitude and were encouraged to have a daily reflection of positive characteristics or circumstances.  Patients were encouraged to identify a plan to utilize their strengths to work on current challenges and goals.  Therapeutic Goals 1. Patient will verbalize personal strengths/positive qualities and relate how these can assist with achieving desired personal goals 2. Patients will verbalize affirmation of peers plans for personal change and goal setting 3. Patients will explore the value of gratitude and positive focus as related to successful achievement of goals 4. Patients will verbalize a plan for regular reinforcement of personal positive qualities and circumstances.  Summary of Patient Progress: Anthony Luna was able to actively participate in today's group.  Anthony Luna showed that he had a good understanding of SMART goals as he had read about it from one of the workbooks given to him in another group.  Anthony Luna shared that his goal for today is "be myself which means I will listen to others and stay sober-minded".        Therapeutic Modalities Cognitive Behavioral Therapy Motivational Interviewing    Alease FrameSonya S Monte Zinni, [place] 10/01/2017 3:11 PM

## 2017-10-02 MED ORDER — LORATADINE 10 MG PO TABS
10.0000 mg | ORAL_TABLET | Freq: Every day | ORAL | Status: DC
  Administered 2017-10-02 – 2017-10-09 (×8): 10 mg via ORAL
  Filled 2017-10-02 (×9): qty 1

## 2017-10-02 MED ORDER — CLOZAPINE 25 MG PO TABS
50.0000 mg | ORAL_TABLET | Freq: Two times a day (BID) | ORAL | Status: DC
  Administered 2017-10-02 – 2017-10-04 (×4): 50 mg via ORAL
  Filled 2017-10-02 (×4): qty 2

## 2017-10-02 NOTE — [facility] Group Notes (Signed)
[facility] Group Notes:  (Nursing/MHT/Case Management/Adjunct)  Date:  10/02/2017  Time:  3:05 PM  Type of Therapy:  Psychoeducational Skills  Participation Level:  Active  Participation Quality:  Appropriate  Affect:  Appropriate  Cognitive:  Appropriate  Insight:  Appropriate  Engagement in Group:  Engaged  Modes of Intervention:  Activity, Discussion, Education and Socialization  Summary of Progress/Problems:  Anthony SmokeCara Luna Anthony Luna 10/02/2017, 3:05 PM

## 2017-10-02 NOTE — Plan of Care (Signed)
Patient slept for Estimated Hours of 5.15; Precautionary checks every 15 minutes for safety maintained, room free of safety hazards, patient sustains no injury or falls during this shift.  Problem: Education: Goal: Utilization of techniques to improve thought processes will improve Outcome: Progressing Goal: Knowledge of the prescribed therapeutic regimen will improve Outcome: Progressing   Problem: Coping: Goal: Coping ability will improve Outcome: Progressing Goal: Will verbalize feelings Outcome: Progressing   Problem: Self-Concept: Goal: Level of anxiety will decrease Outcome: Progressing   Problem: Education: Goal: Knowledge of disease or condition will improve Outcome: Progressing   Problem: Safety: Goal: Ability to remain free from injury will improve Outcome: Progressing   Problem: Education: Goal: Will be free of psychotic symptoms Outcome: Progressing   Problem: Coping: Goal: Will verbalize feelings Outcome: Progressing   Problem: Coping: Goal: Level of anxiety will decrease Outcome: Progressing

## 2017-10-02 NOTE — Plan of Care (Signed)
Patient  is alert, denies SI, HI and AVH, although patient appears to be responding to some internal stimuli. Patient is very pleasant and cooperative. Patient paces up and down the hall. Patient is going to groups today and participating as well as interacting with peers appropriately. Nurse will continue to monitor. Problem: Education: Goal: Utilization of techniques to improve thought processes will improve Outcome: Progressing Goal: Knowledge of the prescribed therapeutic regimen will improve Outcome: Progressing   Problem: Coping: Goal: Coping ability will improve Outcome: Progressing Goal: Will verbalize feelings Outcome: Progressing   Problem: Self-Concept: Goal: Level of anxiety will decrease Outcome: Progressing

## 2017-10-02 NOTE — Tx Team (Signed)
Interdisciplinary Treatment and Diagnostic Plan Update  10/02/2017 Time of Session: [address] MRN: [id number]  Principal Diagnosis: <principal problem not specified>  Secondary Diagnoses: Active Problems:   Schizoaffective disorder (HCC)   Current Medications:  Current Facility-Administered Medications  Medication Dose Route Frequency Provider Last Rate Last Dose  . acetaminophen (TYLENOL) tablet 650 mg  650 mg Oral Q6H PRN [name], [name], MD      . alum & mag hydroxide-simeth (MAALOX/MYLANTA) 200-200-20 MG/5ML suspension 30 mL  30 mL Oral Q4H PRN [name], [name], MD      . amantadine (SYMMETREL) capsule 100 mg  100 mg Oral BID [name], [name], MD   100 mg at 10/02/17 0901  . cloZAPine (CLOZARIL) tablet 25 mg  25 mg Oral BID [name], MD   25 mg at 10/02/17 0901  . lithium carbonate (ESKALITH) CR tablet 450 mg  450 mg Oral Q12H [name], [name], MD   450 mg at 10/02/17 0900  . loratadine (CLARITIN) tablet 10 mg  10 mg Oral Daily [name], [name], MD      . LORazepam (ATIVAN) tablet 1 mg  1 mg Oral Q6H PRN [name], [name], MD      . magnesium hydroxide (MILK OF MAGNESIA) suspension 30 mL  30 mL Oral Daily PRN [name], [name], MD      . nicotine (NICODERM CQ - dosed in mg/24 hours) patch 21 mg  21 mg Transdermal Daily Beverly Sessions, MD   21 mg at 10/02/17 0901  . propranolol (INDERAL) tablet 10 mg  10 mg Oral TID [name], [name], MD   10 mg at 10/02/17 0900  . temazepam (RESTORIL) capsule 15 mg  15 mg Oral QHS [name], [name], MD   15 mg at 10/01/17 2137  . traZODone (DESYREL) tablet 100 mg  100 mg Oral QHS PRN [name], MD   100 mg at 09/30/17 2145   PTA Medications: Medications Prior to Admission  Medication Sig Dispense Refill Last Dose  . amantadine (SYMMETREL) 100 MG capsule Take 1 capsule (100 mg total) by mouth 2 (two) times daily. 60 capsule 0 09/24/2017 at 1800  . amoxicillin (AMOXIL) 500 MG capsule Take 1 capsule (500 mg total) by mouth every 8  (eight) hours. (Patient not taking: Reported on 09/25/2017) 21 capsule 0 Not Taking at Unknown time  . benztropine (COGENTIN) 1 MG tablet Take 1 mg by mouth daily.   09/24/2017 at 2000  . clonazePAM (KLONOPIN) 0.5 MG tablet Take 1 tablet (0.5 mg total) by mouth at bedtime. (Patient not taking: Reported on 09/25/2017) 30 tablet 0 Not Taking at Unknown time  . fluPHENAZine (PROLIXIN) 10 MG tablet Take 1 tablet (10 mg total) by mouth 2 (two) times daily before a meal. 60 tablet 0 09/24/2017 at 1800  . hydrOXYzine (ATARAX/VISTARIL) 25 MG tablet Take 1 tablet (25 mg total) by mouth 3 (three) times daily as needed for itching. 15 tablet 0 prn at prn  . lithium carbonate (ESKALITH) 450 MG CR tablet Take 450 mg by mouth 2 (two) times daily.   09/24/2017 at 2000  . lithium carbonate (LITHOBID) 300 MG CR tablet Take 2 tablets (600 mg total) by mouth every 12 (twelve) hours. (Patient not taking: Reported on 09/25/2017) 120 tablet 1 Not Taking at Unknown time  . Paliperidone Palmitate (INVEGA TRINZA) 819 MG/2.625ML SUSP Inject 819 mg into the muscle.   Past Month at Unknown time  . propranolol (INDERAL) 10 MG tablet  Take 1 tablet (10 mg total) by mouth 3 (three) times daily. 90 tablet 1 unknown at unknown    Patient Stressors: Educational concerns Health problems Substance abuse  Patient Strengths: Ability for insight Average or above average intelligence Capable of independent living Barrister's clerkCommunication skills Motivation for treatment/growth Religious Affiliation Supportive family/friends  Treatment Modalities: Medication Management, Group therapy, Case management,  1 to 1 session with clinician, Psychoeducation, Recreational therapy.   Physician Treatment Plan for Primary Diagnosis: <principal problem not specified> Long Term Goal(s): Improvement in symptoms so as ready for discharge Improvement in symptoms so as ready for discharge   Short Term Goals: Ability to identify changes in lifestyle to reduce  recurrence of condition will improve Ability to verbalize feelings will improve Ability to disclose and discuss suicidal ideas Ability to demonstrate self-control will improve Ability to identify and develop effective coping behaviors will improve Ability to maintain clinical measurements within normal limits will improve Compliance with prescribed medications will improve Ability to identify triggers associated with substance abuse/mental health issues will improve Ability to identify changes in lifestyle to reduce recurrence of condition will improve Ability to verbalize feelings will improve Ability to disclose and discuss suicidal ideas Ability to demonstrate self-control will improve Ability to identify and develop effective coping behaviors will improve Ability to maintain clinical measurements within normal limits will improve Compliance with prescribed medications will improve Ability to identify triggers associated with substance abuse/mental health issues will improve  Medication Management: Evaluate patient's response, side effects, and tolerance of medication regimen.  Therapeutic Interventions: 1 to 1 sessions, Unit Group sessions and Medication administration.  Evaluation of Outcomes: Progressing  Physician Treatment Plan for Secondary Diagnosis: Active Problems:   Schizoaffective disorder (HCC)  Long Term Goal(s): Improvement in symptoms so as ready for discharge Improvement in symptoms so as ready for discharge   Short Term Goals: Ability to identify changes in lifestyle to reduce recurrence of condition will improve Ability to verbalize feelings will improve Ability to disclose and discuss suicidal ideas Ability to demonstrate self-control will improve Ability to identify and develop effective coping behaviors will improve Ability to maintain clinical measurements within normal limits will improve Compliance with prescribed medications will improve Ability to  identify triggers associated with substance abuse/mental health issues will improve Ability to identify changes in lifestyle to reduce recurrence of condition will improve Ability to verbalize feelings will improve Ability to disclose and discuss suicidal ideas Ability to demonstrate self-control will improve Ability to identify and develop effective coping behaviors will improve Ability to maintain clinical measurements within normal limits will improve Compliance with prescribed medications will improve Ability to identify triggers associated with substance abuse/mental health issues will improve     Medication Management: Evaluate patient's response, side effects, and tolerance of medication regimen.  Therapeutic Interventions: 1 to 1 sessions, Unit Group sessions and Medication administration.  Evaluation of Outcomes: Progressing   RN Treatment Plan for Primary Diagnosis: <principal problem not specified> Long Term Goal(s): Knowledge of disease and therapeutic regimen to maintain health will improve  Short Term Goals: Ability to participate in decision making will improve, Ability to identify and develop effective coping behaviors will improve and Compliance with prescribed medications will improve  Medication Management: RN will administer medications as ordered by provider, will assess and evaluate patient's response and provide education to patient for prescribed medication. RN will report any adverse and/or side effects to prescribing provider.  Therapeutic Interventions: 1 on 1 counseling sessions, Psychoeducation, Medication administration, Evaluate responses to treatment,  Monitor vital signs and CBGs as ordered, Perform/monitor CIWA, COWS, AIMS and Fall Risk screenings as ordered, Perform wound care treatments as ordered.  Evaluation of Outcomes: Progressing   LCSW Treatment Plan for Primary Diagnosis: <principal problem not specified> Long Term Goal(s): Safe transition to  appropriate next level of care at discharge, Engage patient in therapeutic group addressing interpersonal concerns.  Short Term Goals: Engage patient in aftercare planning with referrals and resources, Increase emotional regulation and Increase skills for wellness and recovery  Therapeutic Interventions: Assess for all discharge needs, 1 to 1 time with Social worker, Explore available resources and support systems, Assess for adequacy in community support network, Educate family and significant other(s) on suicide prevention, Complete Psychosocial Assessment, Interpersonal group therapy.  Evaluation of Outcomes: Progressing   Progress in Treatment: Attending groups: Yes. Participating in groups: Yes. Taking medication as prescribed: Yes. Toleration medication: Yes. Family/Significant other contact made: No, will contact:  a support if pt provides consent. Patient understands diagnosis: Yes. Discussing patient identified problems/goals with staff: Yes. Medical problems stabilized or resolved: Yes. Denies suicidal/homicidal ideation: Yes. Issues/concerns per patient self-inventory: No. Other: None at this time.   New problem(s) identified: No, Describe:  none at this time.   New Short Term/Long Term Goal(s): Pt reported his goal for treatment is to, "get on the right medications, be myself, stay focused, and be responsible."  Discharge Plan or Barriers: CSW will continue to assess for appropriate discharge plan.   Reason for Continuation of Hospitalization: Hallucinations Medication stabilization  Estimated Length of Stay: 5 days   Attendees: Patient:  10/02/2017 11:15 AM  Physician: Dr. Johnella Moloney, MD 10/02/2017 11:15 AM  Nursing: Milas Hock, RN  10/02/2017 11:15 AM  RN Care Manager: 10/02/2017 11:15 AM  Social Worker: Heidi Dach, LCSW 10/02/2017 11:15 AM  Recreational Therapist: Garret Reddish, CTRS-LRT 10/02/2017 11:15 AM  Other: Johny Shears, LCSWA 10/02/2017 11:15 AM  Other:  Huey Romans, LCSW 10/02/2017 11:15 AM  Other: 10/02/2017 11:15 AM    Scribe for Treatment Team: Heidi Dach, LCSW 10/02/2017 11:15 AM

## 2017-10-02 NOTE — [facility] Suicide Risk Assessment (Signed)
[facility] INPATIENT:  Family/Significant Other Suicide Prevention Education  Suicide Prevention Education:  Education Completed; Pt's CST Team Lead, Earleen ReaperBarbara Clapp, at [phone], has been identified by the patient as the family member/significant other with whom the patient will be residing, and identified as the person(s) who will aid the patient in the event of a mental health crisis (suicidal ideations/suicide attempt).  With written consent from the patient, the family member/significant other has been provided the following suicide prevention education, prior to the and/or following the discharge of the patient.  The suicide prevention education provided includes the following:  Suicide risk factors  Suicide prevention and interventions  National Suicide Hotline telephone number  [facility] assessment telephone number  [facility] Emergency Assistance 911  [facility] and/or Residential Mobile Crisis Unit telephone number  Request made of family/significant other to:  Remove weapons (e.g., guns, rifles, knives), all items previously/currently identified as safety concern.    Remove drugs/medications (over-the-counter, prescriptions, illicit drugs), all items previously/currently identified as a safety concern.  The family member/significant other verbalizes understanding of the suicide prevention education information provided.  The family member/significant other agrees to remove the items of safety concern listed above.  Heidi DachKelsey Malekai Markwood, LCSW 10/02/2017, 9:09 AM

## 2017-10-02 NOTE — [facility] Group Notes (Signed)
10/02/2017 1PM  Type of Therapy and Topic:  Group Therapy:  Feelings around Relapse and Recovery  Participation Level:  Active   Description of Group:    Patients in this group will discuss emotions they experience before and after a relapse. They will process how experiencing these feelings, or avoidance of experiencing them, relates to having a relapse. Facilitator will guide patients to explore emotions they have related to recovery. Patients will be encouraged to process which emotions are more powerful. They will be guided to discuss the emotional reaction significant others in their lives may have to patients' relapse or recovery. Patients will be assisted in exploring ways to respond to the emotions of others without this contributing to a relapse.  Therapeutic Goals: 1. Patient will identify two or more emotions that lead to a relapse for them 2. Patient will identify two emotions that result when they relapse 3. Patient will identify two emotions related to recovery 4. Patient will demonstrate ability to communicate their needs through discussion and/or role plays   Summary of Patient Progress: Actively and appropriately engaged in the group. Patient stayed the entire time. Patient participated with the group. Patient was able to talk about other means of being able to cope with stress instead of substance abuse and relapsing. Patient in still in the process of obtaining treatment goals.      Therapeutic Modalities:   Cognitive Behavioral Therapy Solution-Focused Therapy Assertiveness Training Relapse Prevention Therapy   Avier Jech, LCSW 10/02/2017 1:49 PM    

## 2017-10-02 NOTE — Progress Notes (Signed)
Patient ID: Anthony BarthelBrandon Wayne Hinderliter, male   DOB: 06/05/1986, 32 y.o.   MRN: [id number] Patient is totally different during this shift; respectful, polite, asked relevant information about his medications and treatment, attended the wrap up group; responds with "yes sir..." denied SI/HI/AVH. No odd and or bizarre behavior; observed interacting well with peers, no temper tantrums.

## 2017-10-02 NOTE — Progress Notes (Signed)
Recreation Therapy Notes          Anthony Luna 10/02/2017 11:17 AM

## 2017-10-02 NOTE — Progress Notes (Signed)
Pt behavior remains bizarre he appears to be responding to stimuli. He was medication compliant, he spent most of the evening pacing the hallways. He had minimal interaction with peers and staff although. He is cooperative with treatment. He appears to be in bed resting quietly at this time.

## 2017-10-02 NOTE — Progress Notes (Signed)
[facility]  10/02/2017 12:55 PM Anthony Luna  MRN:  [id number] Subjective:  Pt states that he is doing well today. He had improved affect overnight per nursing staff. He states, "I'm setting goals and accomplishing them." When asked about his goals, he states, "I'm trying to stay sober minded and pray." Pt has brighter affect today and talking more. He has better eye contact and overall appears improved from yesterday. HE feels like the medications are working well. HE states that the voices are getting quieter. He states that they are still there at times and this morning he heard one say, "This is how you be secretive." He feels the voices are improving and much less intrusive.   Principal Problem: <principal problem not specified> Diagnosis:   Patient Active Problem List   Diagnosis Date Noted  . Schizoaffective disorder (HCC) [F25.9] 09/25/2017  . Cannabis use disorder, severe, dependence (HCC) [F12.20] 11/30/2014  . Schizoaffective disorder, bipolar type (HCC) [F25.0] 11/30/2014  . Tobacco use disorder [F17.200] 11/30/2014   Total Time spent with patient: 20 minutes  Past Psychiatric History: See H&P  Past Medical History:  Past Medical History:  Diagnosis Date  . Depression    Since moving into new apartment 1 year ago  . None to low serum cortisol response with adrenocorticotrophic hormone (ACTH) stimulation test   . Schizophrenia, paranoid type (HCC)    History reviewed. No pertinent surgical history. Family History: History reviewed. No pertinent family history. Family Psychiatric  History: See H&P Social History:  Social History   Substance and Sexual Activity  Alcohol Use Yes  . Alcohol/week: 0.0 oz   Comment: "Special occasions"     Social History   Substance and Sexual Activity  Drug Use Yes  . Frequency: 4.0 times per week  . Types: Marijuana   Comment: "2 blunts"    Social History   Socioeconomic History  . Marital status: Single     Spouse name: Not on file  . Number of children: Not on file  . Years of education: Not on file  . Highest education level: Not on file  Occupational History  . Not on file  Social Needs  . Financial resource strain: Not on file  . Food insecurity:    Worry: Not on file    Inability: Not on file  . Transportation needs:    Medical: Not on file    Non-medical: Not on file  Tobacco Use  . Smoking status: Current Every Day Smoker    Packs/day: 0.50    Types: Cigarettes    Start date: 11/29/2002  . Smokeless tobacco: Former NeurosurgeonUser  . Tobacco comment: Patient wishes to quit today.  Substance and Sexual Activity  . Alcohol use: Yes    Alcohol/week: 0.0 oz    Comment: "Special occasions"  . Drug use: Yes    Frequency: 4.0 times per week    Types: Marijuana    Comment: "2 blunts"  . Sexual activity: Not on file  Lifestyle  . Physical activity:    Days per week: Not on file    Minutes per session: Not on file  . Stress: Not on file  Relationships  . Social connections:    Talks on phone: Not on file    Gets together: Not on file    Attends religious service: Not on file    Active member of club or organization: Not on file    Attends meetings of clubs or organizations: Not on file  Relationship status: Not on file  Other Topics Concern  . Not on file  Social History Narrative  . Not on file   Additional Social History:    Pain Medications: See PTA Prescriptions: See PTA Over the Counter: See PTA History of alcohol / drug use?: Yes Negative Consequences of Use: Financial, Personal relationships, Work / School Name of Substance 1: Alcohol 1 - Amount (size/oz): 2-3 Drinks 1 - Frequency: "Special occasions" 1 - Last Use / Amount: Last night (4/11)                  Sleep: Fair  Appetite:  Fair  Current Medications: Current Facility-Administered Medications  Medication Dose Route Frequency Provider Last Rate Last Dose  . acetaminophen (TYLENOL) tablet 650 mg   650 mg Oral Q6H PRN [name], [name], MD      . alum & mag hydroxide-simeth (MAALOX/MYLANTA) 200-200-20 MG/5ML suspension 30 mL  30 mL Oral Q4H PRN [name], [name], MD      . amantadine (SYMMETREL) capsule 100 mg  100 mg Oral BID [name], [name], MD   100 mg at 10/02/17 0901  . cloZAPine (CLOZARIL) tablet 50 mg  50 mg Oral BID [name], [name], MD      . lithium carbonate (ESKALITH) CR tablet 450 mg  450 mg Oral Q12H [name], [name], MD   450 mg at 10/02/17 0900  . loratadine (CLARITIN) tablet 10 mg  10 mg Oral Daily [name], [name], MD      . LORazepam (ATIVAN) tablet 1 mg  1 mg Oral Q6H PRN [name], [name], MD      . magnesium hydroxide (MILK OF MAGNESIA) suspension 30 mL  30 mL Oral Daily PRN [name], [name], MD      . nicotine (NICODERM CQ - dosed in mg/24 hours) patch 21 mg  21 mg Transdermal Daily Beverly Sessions, MD   21 mg at 10/02/17 0901  . propranolol (INDERAL) tablet 10 mg  10 mg Oral TID [name], [name], MD   10 mg at 10/02/17 0900  . traZODone (DESYREL) tablet 100 mg  100 mg Oral QHS PRN [name], MD   100 mg at 09/30/17 2145    Lab Results:  Results for orders placed or performed during the hospital encounter of 09/25/17 (from the past 48 hour(s))  CBC with Differential/Platelet     Status: Abnormal   Collection Time: 10/01/17  6:43 AM  Result Value Ref Range   WBC 6.6 3.8 - 10.6 K/uL   RBC 5.18 4.40 - 5.90 MIL/uL   Hemoglobin 16.5 13.0 - 18.0 g/dL   HCT 60.4 54.0 - 98.1 %   MCV 92.9 80.0 - 100.0 fL   MCH 31.8 26.0 - 34.0 pg   MCHC 34.3 32.0 - 36.0 g/dL   RDW 19.1 47.8 - 29.5 %   Platelets 188 150 - 440 K/uL   Neutrophils Relative % 52 %   Neutro Abs 3.5 1.4 - 6.5 K/uL   Lymphocytes Relative 20 %   Lymphs Abs 1.3 1.0 - 3.6 K/uL   Monocytes Relative 22 %   Monocytes Absolute 1.5 (H) 0.2 - 1.0 K/uL   Eosinophils Relative 5 %   Eosinophils Absolute 0.3 0 - 0.7 K/uL   Basophils Relative 1 %   Basophils Absolute 0.0 0 - 0.1 K/uL    Comment: Performed at [facility], [address] [place], [place] [address]    Blood Alcohol level:  Lab  Results  Component Value Date   ETH <10 09/25/2017   ETH <10 04/07/2017    Metabolic Disorder Labs: Lab Results  Component Value Date   HGBA1C 4.7 (L) 09/26/2017   MPG 88.19 09/26/2017   MPG 105.41 04/08/2017   Lab Results  Component Value Date   PROLACTIN 6.1 12/19/2014   PROLACTIN 1.3 (L) 11/30/2014   Lab Results  Component Value Date   CHOL 153 09/26/2017   TRIG 64 09/26/2017   HDL 55 09/26/2017   CHOLHDL 2.8 09/26/2017   VLDL 13 09/26/2017   LDLCALC 85 09/26/2017   LDLCALC 95 04/08/2017    Physical Findings: AIMS: Facial and Oral Movements Muscles of Facial Expression: None, normal Lips and Perioral Area: None, normal Jaw: None, normal Tongue: None, normal,Extremity Movements Upper (arms, wrists, hands, fingers): None, normal Lower (legs, knees, ankles, toes): None, normal, Trunk Movements Neck, shoulders, hips: None, normal, Overall Severity Severity of abnormal movements (highest score from questions above): None, normal Incapacitation due to abnormal movements: None, normal Patient's awareness of abnormal movements (rate only patient's report): No Awareness, Dental Status Current problems with teeth and/or dentures?: No Does patient usually wear dentures?: No  CIWA:  CIWA-Ar Total: 1 COWS:  COWS Total Score: 1  Musculoskeletal: Strength & Muscle Tone: within normal limits Gait & Station: normal Patient leans: N/A  Psychiatric Specialty Exam: Physical Exam  Nursing note and vitals reviewed.   Review of Systems  All other systems reviewed and are negative.   Blood pressure 110/61, pulse (!) 110, temperature 97.6 F (36.4 C), temperature source Oral, resp. rate 18, height 6' (1.829 m), weight 85.7 kg (189 lb), SpO2 100 %.Body mass index is 25.63 kg/m.  General Appearance: Casual  Eye Contact:  Good-much improved  Speech:  Clear and Coherent  Volume:  Normal   Mood:  Euthymic  Affect:  Congruent- much improved, smiling, engaging  Thought Process:  Coherent and Goal Directed  Orientation:  Full (Time, Place, and Person)  Thought Content:  Hallucinations: Auditory but improving  Suicidal Thoughts:  No  Homicidal Thoughts:  No  Memory:  Immediate;   Fair  Judgement:  Fair  Insight:  Fair  Psychomotor Activity:  Normal  Concentration:  Concentration: Fair  Recall:  [employer] of Knowledge:  Fair  Language:  Fair  Akathisia:  Denies feeling restless, pacing      Assets:  Resilience  ADL's:  Intact  Cognition:  WNL  Sleep:  Number of Hours: 5.15     Treatment Plan Summary: 32 yo male admitted due to psychosis. HE is improved from yesterday. Much better eye contact and able to answer questions with no latencies. Much more interactive today. He is tachycardic today possibly from Clozapine. Will monitor.   Plan:  Schizoaffective disorder -Increase Clozapine to 50 mg BID. ANC 3.5 -He is on [place]. Last dose given 09/18/17 -Continue propranolol for akathisia and will also help with tachycardia -Continue Lithium 450 mg BID  Marijuana use disorder -Continue to advise abstaining from marijuana use. HE plans to continue NA meetings  Dispo -He will return to his apartment on discharge. He will follow up with his CST team. I attempted to contact Dr. Omelia Blackwater to discuss initiation of Clozapine but he is on vacation until next weekend.        [name], MD 10/02/2017, 12:55 PM

## 2017-10-03 DIAGNOSIS — F251 Schizoaffective disorder, depressive type: Secondary | ICD-10-CM

## 2017-10-03 NOTE — [facility] Group Notes (Signed)
LCSW Group Therapy Note   10/03/2017 1:15pm   Type of Therapy and Topic:  Group Therapy:  Trust and Honesty  Participation Level:  Minimal  Description of Group:    In this group patients will be asked to explore the value of being honest.  Patients will be guided to discuss their thoughts, feelings, and behaviors related to honesty and trusting in others. Patients will process together how trust and honesty relate to forming relationships with peers, family members, and self. Each patient will be challenged to identify and express feelings of being vulnerable. Patients will discuss reasons why people are dishonest and identify alternative outcomes if one was truthful (to self or others). This group will be process-oriented, with patients participating in exploration of their own experiences, giving and receiving support, and processing challenge from other group members.   Therapeutic Goals: 1. Patient will identify why honesty is important to relationships and how honesty overall affects relationships.  2. Patient will identify a situation where they lied or were lied too and the  feelings, thought process, and behaviors surrounding the situation 3. Patient will identify the meaning of being vulnerable, how that feels, and how that correlates to being honest with self and others. 4. Patient will identify situations where they could have told the truth, but instead lied and explain reasons of dishonesty.   Summary of Patient Progress: Patient reported he feels "pretty good". He shared that she is truthful with everyone. Patient appeared sleepy and was quiet most group discussion.     Therapeutic Modalities:   Cognitive Behavioral Therapy Solution Focused Therapy Motivational Interviewing Brief Therapy  Anthony Luna  CUEBAS-COLON, LCSW 10/03/2017 10:56 AM

## 2017-10-03 NOTE — Progress Notes (Signed)
[facility] MD Progress Note  10/03/2017 3:35 PM Anthony Luna  MRN:  [id number] Subjective: Follow-up patient with schizophrenia.  No new complaints.  Remains withdrawn with some disorganized thought much of the time. Principal Problem: Schizoaffective disorder (HCC) Diagnosis:   Patient Active Problem List   Diagnosis Date Noted  . Schizoaffective disorder (HCC) [F25.9] 09/25/2017  . Cannabis use disorder, severe, dependence (HCC) [F12.20] 11/30/2014  . Schizoaffective disorder, bipolar type (HCC) [F25.0] 11/30/2014  . Tobacco use disorder [F17.200] 11/30/2014   Total Time spent with patient: 30 minutes  Past Psychiatric History: Chronic schizophrenia multiple hospitalizations  Past Medical History:  Past Medical History:  Diagnosis Date  . Depression    Since moving into new apartment 1 year ago  . None to low serum cortisol response with adrenocorticotrophic hormone (ACTH) stimulation test   . Schizophrenia, paranoid type (HCC)    History reviewed. No pertinent surgical history. Family History: History reviewed. No pertinent family history. Family Psychiatric  History: None Social History:  Social History   Substance and Sexual Activity  Alcohol Use Yes  . Alcohol/week: 0.0 oz   Comment: "Special occasions"     Social History   Substance and Sexual Activity  Drug Use Yes  . Frequency: 4.0 times per week  . Types: Marijuana   Comment: "2 blunts"    Social History   Socioeconomic History  . Marital status: Single    Spouse name: Not on file  . Number of children: Not on file  . Years of education: Not on file  . Highest education level: Not on file  Occupational History  . Not on file  Social Needs  . Financial resource strain: Not on file  . Food insecurity:    Worry: Not on file    Inability: Not on file  . Transportation needs:    Medical: Not on file    Non-medical: Not on file  Tobacco Use  . Smoking status: Current Every Day Smoker    Packs/day:  0.50    Types: Cigarettes    Start date: 11/29/2002  . Smokeless tobacco: Former Neurosurgeon  . Tobacco comment: Patient wishes to quit today.  Substance and Sexual Activity  . Alcohol use: Yes    Alcohol/week: 0.0 oz    Comment: "Special occasions"  . Drug use: Yes    Frequency: 4.0 times per week    Types: Marijuana    Comment: "2 blunts"  . Sexual activity: Not on file  Lifestyle  . Physical activity:    Days per week: Not on file    Minutes per session: Not on file  . Stress: Not on file  Relationships  . Social connections:    Talks on phone: Not on file    Gets together: Not on file    Attends religious service: Not on file    Active member of club or organization: Not on file    Attends meetings of clubs or organizations: Not on file    Relationship status: Not on file  Other Topics Concern  . Not on file  Social History Narrative  . Not on file   Additional Social History:    Pain Medications: See PTA Prescriptions: See PTA Over the Counter: See PTA History of alcohol / drug use?: Yes Negative Consequences of Use: Financial, Personal relationships, Work / School Name of Substance 1: Alcohol 1 - Amount (size/oz): 2-3 Drinks 1 - Frequency: "Special occasions" 1 - Last Use / Amount: Last night (4/11)  Sleep: Fair  Appetite:  Fair  Current Medications: Current Facility-Administered Medications  Medication Dose Route Frequency Provider Last Rate Last Dose  . acetaminophen (TYLENOL) tablet 650 mg  650 mg Oral Q6H PRN [name], [name], MD      . alum & mag hydroxide-simeth (MAALOX/MYLANTA) 200-200-20 MG/5ML suspension 30 mL  30 mL Oral Q4H PRN [name], [name], MD      . amantadine (SYMMETREL) capsule 100 mg  100 mg Oral BID [name], [name], MD   100 mg at 10/03/17 0829  . cloZAPine (CLOZARIL) tablet 50 mg  50 mg Oral BID Haskell RilingMcNew, [name], MD   50 mg at 10/03/17 0829  . lithium carbonate (ESKALITH) CR tablet 450 mg  450 mg Oral Q12H [name], [name],  MD   450 mg at 10/03/17 0831  . loratadine (CLARITIN) tablet 10 mg  10 mg Oral Daily [name], Ileene HutchinsonHolly R, MD   10 mg at 10/03/17 [id number]  . LORazepam (ATIVAN) tablet 1 mg  1 mg Oral Q6H PRN [name], Ileene HutchinsonHolly R, MD      . magnesium hydroxide (MILK OF MAGNESIA) suspension 30 mL  30 mL Oral Daily PRN [name], [name], MD      . nicotine (NICODERM CQ - dosed in mg/24 hours) patch 21 mg  21 mg Transdermal Daily [name], Jagannath, MD   21 mg at 10/03/17 [id number]  . propranolol (INDERAL) tablet 10 mg  10 mg Oral TID [name], [name], MD   10 mg at 10/03/17 1153  . traZODone (DESYREL) tablet 100 mg  100 mg Oral QHS PRN Haskell RilingMcNew, [name], MD   100 mg at 10/02/17 2132    Lab Results: No results found for this or any previous visit (from the past 48 hour(s)).  Blood Alcohol level:  Lab Results  Component Value Date   ETH <10 09/25/2017   ETH <10 04/07/2017    Metabolic Disorder Labs: Lab Results  Component Value Date   HGBA1C 4.7 (L) 09/26/2017   MPG 88.19 09/26/2017   MPG 105.41 04/08/2017   Lab Results  Component Value Date   PROLACTIN 6.1 12/19/2014   PROLACTIN 1.3 (L) 11/30/2014   Lab Results  Component Value Date   CHOL 153 09/26/2017   TRIG 64 09/26/2017   HDL 55 09/26/2017   CHOLHDL 2.8 09/26/2017   VLDL 13 09/26/2017   LDLCALC 85 09/26/2017   LDLCALC 95 04/08/2017    Physical Findings: AIMS: Facial and Oral Movements Muscles of Facial Expression: None, normal Lips and Perioral Area: None, normal Jaw: None, normal Tongue: None, normal,Extremity Movements Upper (arms, wrists, hands, fingers): None, normal Lower (legs, knees, ankles, toes): None, normal, Trunk Movements Neck, shoulders, hips: None, normal, Overall Severity Severity of abnormal movements (highest score from questions above): None, normal Incapacitation due to abnormal movements: None, normal Patient's awareness of abnormal movements (rate only patient's report): No Awareness, Dental Status Current problems with teeth and/or  dentures?: No Does patient usually wear dentures?: No  CIWA:  CIWA-Ar Total: 1 COWS:  COWS Total Score: 1  Musculoskeletal: Strength & Muscle Tone: within normal limits Gait & Station: normal Patient leans: N/A  Psychiatric Specialty Exam: Physical Exam  Nursing note and vitals reviewed. Constitutional: He appears well-developed and well-nourished.  HENT:  Head: Normocephalic and atraumatic.  Eyes: Pupils are equal, round, and reactive to light. Conjunctivae are normal.  Neck: Normal range of motion.  Cardiovascular: Normal heart sounds.  Respiratory: Effort normal.  GI: Soft.  Musculoskeletal: Normal range of  motion.  Neurological: He is alert.  Skin: Skin is warm and dry.  Psychiatric: His affect is blunt. His speech is delayed. He is slowed. Thought content is paranoid. Cognition and memory are impaired. He expresses impulsivity.    Review of Systems  Constitutional: Negative.   HENT: Negative.   Eyes: Negative.   Respiratory: Negative.   Cardiovascular: Negative.   Gastrointestinal: Negative.   Musculoskeletal: Negative.   Skin: Negative.   Neurological: Negative.   Psychiatric/Behavioral: Negative for depression, hallucinations, memory loss, substance abuse and suicidal ideas. The patient is not nervous/anxious and does not have insomnia.     Blood pressure 134/80, pulse (!) 102, temperature 98.9 F (37.2 C), temperature source Oral, resp. rate 18, height 6' (1.829 m), weight 85.7 kg (189 lb), SpO2 97 %.Body mass index is 25.63 kg/m.  General Appearance: Casual  Eye Contact:  Good  Speech:  Clear and Coherent  Volume:  Decreased  Mood:  Dysphoric  Affect:  Congruent  Thought Process:  Goal Directed  Orientation:  Full (Time, Place, and Person)  Thought Content:  Logical  Suicidal Thoughts:  No  Homicidal Thoughts:  No  Memory:  Immediate;   Fair Recent;   Fair Remote;   Fair  Judgement:  Impaired  Insight:  Shallow  Psychomotor Activity:  Decreased   Concentration:  Concentration: Fair  Recall:  [employer] of Knowledge:  Fair  Language:  Fair  Akathisia:  No  Handed:  Right  AIMS (if indicated):     Assets:  Desire for Improvement Housing Physical Health  ADL's:  Intact  Cognition:  WNL  Sleep:  Number of Hours: 6     Treatment Plan Summary: Daily contact with patient to assess and evaluate symptoms and progress in treatment, Medication management and Plan Supportive counseling no change to medication management review of chart.  Encourage patient in group attendance and medicine compliance  Mordecai Rasmussen, MD 10/03/2017, 3:35 PM

## 2017-10-03 NOTE — Plan of Care (Signed)
Pt alert and oriented. Pt stated he feels good today and his goal is to stay positive. Pt having no pain presently. Pt denies SI/HI. Compliant with medications. Pt paces around the unit often and participates in groups. No other complaints at this time. Will continue to monitor.

## 2017-10-04 MED ORDER — CLOZAPINE 25 MG PO TABS
75.0000 mg | ORAL_TABLET | Freq: Two times a day (BID) | ORAL | Status: DC
  Administered 2017-10-04 – 2017-10-05 (×2): 75 mg via ORAL
  Filled 2017-10-04 (×2): qty 3

## 2017-10-04 NOTE — [facility] Group Notes (Addendum)
LCSW Group Therapy Note 10/04/2017 11:00am  Type of Therapy and Topic: Group Therapy: Feelings Around Returning Home & Establishing a Supportive Framework and Supporting Oneself When Supports Not Available  Participation Level: Active  Description of Group:  Patients first processed thoughts and feelings about upcoming discharge. These included fears of upcoming changes, lack of change, new living environments, judgements and expectations from others and overall stigma of mental health issues. The group then discussed the definition of a supportive framework, what that looks and feels like, and how do to discern it from an unhealthy non-supportive network. The group identified different types of supports as well as what to do when your family/friends are less than helpful or unavailable  Therapeutic Goals  1. Patient will identify one healthy supportive network that they can use at discharge. 2. Patient will identify one factor of a supportive framework and how to tell it from an unhealthy network. 3. Patient able to identify one coping skill to use when they do not have positive supports from others. 4. Patient will demonstrate ability to communicate their needs through discussion and/or role plays.  Summary of Patient Progress:  The patient stated he feels "sleepy and tired because of the medication." Pt engaged actively during group session. As patients processed their anxiety about discharge and described healthy supports patient shared he is ready to go back to a group home. He stated he does not want to go back to his apartment. Pt reported that he feels better.  Patients identified at least one self-care tool they were willing to use after discharge, playing sports.   Therapeutic Modalities Cognitive Behavioral Therapy Motivational Interviewing   Cydnie Deason  CUEBAS-COLON, LCSW 10/04/2017 9:12 AM

## 2017-10-04 NOTE — Plan of Care (Signed)
Pt is calm and cooperative. Pt complain of insomnia. Pt given prn per MAR. Pt remains isolative to yourself. Pt is medication compliant. Remains on Q 15 mins safety rounds.  Problem: Education: Goal: Utilization of techniques to improve thought processes will improve Outcome: Progressing Goal: Knowledge of the prescribed therapeutic regimen will improve Outcome: Progressing   Problem: Coping: Goal: Coping ability will improve Outcome: Progressing Goal: Will verbalize feelings Outcome: Progressing   Problem: Self-Concept: Goal: Level of anxiety will decrease Outcome: Progressing   Problem: Education: Goal: Knowledge of disease or condition will improve Outcome: Progressing   Problem: Safety: Goal: Ability to remain free from injury will improve Outcome: Progressing   Problem: Education: Goal: Will be free of psychotic symptoms Outcome: Progressing   Problem: Coping: Goal: Will verbalize feelings Outcome: Progressing   Problem: Coping: Goal: Level of anxiety will decrease Outcome: Progressing

## 2017-10-04 NOTE — Progress Notes (Signed)
[facility] MD Progress Note  10/04/2017 10:06 AM Anthony Luna  MRN:  [id number] Subjective: Follow-up 32 year old man with a history of schizophrenia.  Patient seen chart reviewed.  Patient himself has no new complaints.  Denies hallucinations.  Does not show any signs of side effects from medicine.  Not oversedated.  Not drooling. Principal Problem: Schizoaffective disorder (HCC) Diagnosis:   Patient Active Problem List   Diagnosis Date Noted  . Schizoaffective disorder (HCC) [F25.9] 09/25/2017  . Cannabis use disorder, severe, dependence (HCC) [F12.20] 11/30/2014  . Schizoaffective disorder, bipolar type (HCC) [F25.0] 11/30/2014  . Tobacco use disorder [F17.200] 11/30/2014   Total Time spent with patient: 30 minutes  Past Psychiatric History: History of schizoaffective disorder with several hospitalizations  Past Medical History:  Past Medical History:  Diagnosis Date  . Depression    Since moving into new apartment 1 year ago  . None to low serum cortisol response with adrenocorticotrophic hormone (ACTH) stimulation test   . Schizophrenia, paranoid type (HCC)    History reviewed. No pertinent surgical history. Family History: History reviewed. No pertinent family history. Family Psychiatric  History: See previous note Social History:  Social History   Substance and Sexual Activity  Alcohol Use Yes  . Alcohol/week: 0.0 oz   Comment: "Special occasions"     Social History   Substance and Sexual Activity  Drug Use Yes  . Frequency: 4.0 times per week  . Types: Marijuana   Comment: "2 blunts"    Social History   Socioeconomic History  . Marital status: Single    Spouse name: Not on file  . Number of children: Not on file  . Years of education: Not on file  . Highest education level: Not on file  Occupational History  . Not on file  Social Needs  . Financial resource strain: Not on file  . Food insecurity:    Worry: Not on file    Inability: Not on file  .  Transportation needs:    Medical: Not on file    Non-medical: Not on file  Tobacco Use  . Smoking status: Current Every Day Smoker    Packs/day: 0.50    Types: Cigarettes    Start date: 11/29/2002  . Smokeless tobacco: Former Neurosurgeon  . Tobacco comment: Patient wishes to quit today.  Substance and Sexual Activity  . Alcohol use: Yes    Alcohol/week: 0.0 oz    Comment: "Special occasions"  . Drug use: Yes    Frequency: 4.0 times per week    Types: Marijuana    Comment: "2 blunts"  . Sexual activity: Not on file  Lifestyle  . Physical activity:    Days per week: Not on file    Minutes per session: Not on file  . Stress: Not on file  Relationships  . Social connections:    Talks on phone: Not on file    Gets together: Not on file    Attends religious service: Not on file    Active member of club or organization: Not on file    Attends meetings of clubs or organizations: Not on file    Relationship status: Not on file  Other Topics Concern  . Not on file  Social History Narrative  . Not on file   Additional Social History:    Pain Medications: See PTA Prescriptions: See PTA Over the Counter: See PTA History of alcohol / drug use?: Yes Negative Consequences of Use: Financial, Personal relationships, Work / School Name  of Substance 1: Alcohol 1 - Amount (size/oz): 2-3 Drinks 1 - Frequency: "Special occasions" 1 - Last Use / Amount: Last night (4/11)                  Sleep: Fair  Appetite:  Fair  Current Medications: Current Facility-Administered Medications  Medication Dose Route Frequency Provider Last Rate Last Dose  . acetaminophen (TYLENOL) tablet 650 mg  650 mg Oral Q6H PRN Debbe Crumble T, MD      . alum & mag hydroxide-simeth (MAALOX/MYLANTA) 200-200-20 MG/5ML suspension 30 mL  30 mL Oral Q4H PRN Astor Gentle T, MD      . amantadine (SYMMETREL) capsule 100 mg  100 mg Oral BID Aloysious Vangieson, [name], MD   100 mg at 10/04/17 0855  . cloZAPine (CLOZARIL) tablet  75 mg  75 mg Oral BID Emiya Loomer T, MD      . lithium carbonate (ESKALITH) CR tablet 450 mg  450 mg Oral Q12H Safal Halderman, [name], MD   450 mg at 10/04/17 0855  . loratadine (CLARITIN) tablet 10 mg  10 mg Oral Daily [name], [name], MD   10 mg at 10/04/17 0855  . LORazepam (ATIVAN) tablet 1 mg  1 mg Oral Q6H PRN [name], MD   1 mg at 10/03/17 2130  . magnesium hydroxide (MILK OF MAGNESIA) suspension 30 mL  30 mL Oral Daily PRN Jayah Balthazar T, MD      . nicotine (NICODERM CQ - dosed in mg/24 hours) patch 21 mg  21 mg Transdermal Daily Beverly Sessions, MD   21 mg at 10/04/17 0858  . propranolol (INDERAL) tablet 10 mg  10 mg Oral TID Germany Chelf, [name], MD   10 mg at 10/03/17 1640  . traZODone (DESYREL) tablet 100 mg  100 mg Oral QHS PRN [name], MD   100 mg at 10/03/17 2131    Lab Results: No results found for this or any previous visit (from the past 48 hour(s)).  Blood Alcohol level:  Lab Results  Component Value Date   ETH <10 09/25/2017   ETH <10 04/07/2017    Metabolic Disorder Labs: Lab Results  Component Value Date   HGBA1C 4.7 (L) 09/26/2017   MPG 88.19 09/26/2017   MPG 105.41 04/08/2017   Lab Results  Component Value Date   PROLACTIN 6.1 12/19/2014   PROLACTIN 1.3 (L) 11/30/2014   Lab Results  Component Value Date   CHOL 153 09/26/2017   TRIG 64 09/26/2017   HDL 55 09/26/2017   CHOLHDL 2.8 09/26/2017   VLDL 13 09/26/2017   LDLCALC 85 09/26/2017   LDLCALC 95 04/08/2017    Physical Findings: AIMS: Facial and Oral Movements Muscles of Facial Expression: None, normal Lips and Perioral Area: None, normal Jaw: None, normal Tongue: None, normal,Extremity Movements Upper (arms, wrists, hands, fingers): None, normal Lower (legs, knees, ankles, toes): None, normal, Trunk Movements Neck, shoulders, hips: None, normal, Overall Severity Severity of abnormal movements (highest score from questions above): None, normal Incapacitation due to abnormal movements:  None, normal Patient's awareness of abnormal movements (rate only patient's report): No Awareness, Dental Status Current problems with teeth and/or dentures?: No Does patient usually wear dentures?: No  CIWA:  CIWA-Ar Total: 1 COWS:  COWS Total Score: 1  Musculoskeletal: Strength & Muscle Tone: within normal limits Gait & Station: normal Patient leans: N/A  Psychiatric Specialty Exam: Physical Exam  Nursing note and vitals reviewed. Constitutional: He appears well-developed and well-nourished.  HENT:  Head: Normocephalic and atraumatic.  Eyes: Pupils are equal, round, and reactive to light. Conjunctivae are normal.  Neck: Normal range of motion.  Cardiovascular: Regular rhythm and normal heart sounds.  Respiratory: Effort normal. No respiratory distress.  GI: Soft. He exhibits no distension.  Musculoskeletal: Normal range of motion.  Neurological: He is alert.  Skin: Skin is warm and dry.  Psychiatric: Judgment normal. His affect is blunt. His speech is delayed. He is slowed and withdrawn. Cognition and memory are impaired. He expresses no homicidal and no suicidal ideation.    Review of Systems  Constitutional: Negative.   HENT: Negative.   Eyes: Negative.   Respiratory: Negative.   Cardiovascular: Negative.   Gastrointestinal: Negative.   Musculoskeletal: Negative.   Skin: Negative.   Neurological: Negative.   Psychiatric/Behavioral: Negative.  Negative for depression, hallucinations, memory loss, substance abuse and suicidal ideas. The patient is not nervous/anxious and does not have insomnia.     Blood pressure 106/74, pulse (!) 128, temperature 98.1 F (36.7 C), temperature source Oral, resp. rate 18, height 6' (1.829 m), weight 85.7 kg (189 lb), SpO2 97 %.Body mass index is 25.63 kg/m.  General Appearance: Casual  Eye Contact:  Good  Speech:  Slow  Volume:  Decreased  Mood:  Euthymic  Affect:  Constricted  Thought Process:  Goal Directed  Orientation:  Full  (Time, Place, and Person)  Thought Content:  Tangential  Suicidal Thoughts:  No  Homicidal Thoughts:  No  Memory:  Immediate;   Fair Recent;   Fair Remote;   Fair  Judgement:  Fair  Insight:  Fair  Psychomotor Activity:  Decreased  Concentration:  Concentration: Fair  Recall:  [employer]  Fund of Knowledge:  Fair  Language:  Fair  Akathisia:  No  Handed:  Right  AIMS (if indicated):     Assets:  Desire for Improvement Housing Physical Health  ADL's:  Intact  Cognition:  Impaired,  Mild  Sleep:  Number of Hours: 6     Treatment Plan Summary: Daily contact with patient to assess and evaluate symptoms and progress in treatment, Medication management and Plan Patient with schizoaffective disorder.  He paces around and does not interact much with other people.  Stays quiet and to himself.  Not acting out aggressively.  No indication of any side effects from medicine.  Following protocol will increase the dose of his clozapine up to 75 mg twice a day.  Encourage patient with group attendance and interaction.  Mordecai RasmussenJohn Lucciano Vitali, MD 10/04/2017, 10:06 AM

## 2017-10-04 NOTE — Plan of Care (Signed)
Patient is alert with pleasant effect. Denies  SI, HI and AVH. Patient attends groups and interacts appropriately with staff and peers. Patient rates pain 0/10 and states he is feeling good today. Patient paces up and down the hallway with a smile. Patient received medication education and verbalized understanding. Patient has no complaints. Nurse will continue to monitor. Problem: Education: Goal: Utilization of techniques to improve thought processes will improve Outcome: Progressing Goal: Knowledge of the prescribed therapeutic regimen will improve Outcome: Progressing   Problem: Coping: Goal: Coping ability will improve Outcome: Progressing Goal: Will verbalize feelings Outcome: Progressing   Problem: Self-Concept: Goal: Level of anxiety will decrease Outcome: Progressing   Problem: Education: Goal: Knowledge of disease or condition will improve Outcome: Progressing

## 2017-10-05 MED ORDER — CLOZAPINE 25 MG PO TABS
150.0000 mg | ORAL_TABLET | Freq: Every day | ORAL | Status: DC
  Administered 2017-10-06: 150 mg via ORAL
  Filled 2017-10-05: qty 1

## 2017-10-05 MED ORDER — CLOZAPINE 25 MG PO TABS
75.0000 mg | ORAL_TABLET | Freq: Once | ORAL | Status: AC
  Administered 2017-10-05: 75 mg via ORAL
  Filled 2017-10-05: qty 3

## 2017-10-05 NOTE — Progress Notes (Signed)
Recreation Therapy Notes  Date: 10/05/2017  Time: 9:30 am  Location: Craft Room  Behavioral response: Appropriate  Intervention Topic: Problem Solving  Discussion/Intervention:  Group content on today was focused on problem solving. The group described what problem solving is. Patients expressed how problems affect them and how they deal with problems. Individuals identified healthy ways to deal with problems. Patients explained what normally happens to them when they do not deal with problems. The group expressed reoccurring problems for them. The group participated in the intervention "Ways to Solve problems" where patients were given a chance to explore different ways to solve problems.  Clinical Observations/Feedback:  Patient came to group and defined problem solving as coming up with a solution. He stated in order for him to solve a problem he must first examine himself and then come up with a solution. Individual stated a negative way he used to solve his problems in the past was fighting. Patient participated in the intervention and was social with peers and staff during group.   Hayato Guaman LRT/CTRS         Fraser Busche 10/05/2017 11:45 AM

## 2017-10-05 NOTE — Plan of Care (Signed)
Pt is calm and cooperative. Pt complain of insomnia. Pt given prn per MAR. Pt interact with peers in dayroom . Pt engage in playing cards with peers. Pt is medication compliant.  Pt is more affect appropriate today and less flat. Remains on Q 15 mins safety rounds.  Problem: Education: Goal: Utilization of techniques to improve thought processes will improve Outcome: Progressing Goal: Knowledge of the prescribed therapeutic regimen will improve Outcome: Progressing   Problem: Coping: Goal: Coping ability will improve Outcome: Progressing Goal: Will verbalize feelings Outcome: Progressing   Problem: Self-Concept: Goal: Level of anxiety will decrease Outcome: Progressing   Problem: Education: Goal: Knowledge of disease or condition will improve Outcome: Progressing   Problem: Safety: Goal: Ability to remain free from injury will improve Outcome: Progressing   Problem: Education: Goal: Will be free of psychotic symptoms Outcome: Progressing   Problem: Coping: Goal: Will verbalize feelings Outcome: Progressing   Problem: Coping: Goal: Level of anxiety will decrease Outcome: Progressing

## 2017-10-05 NOTE — Progress Notes (Signed)
D: patient presented to the medication room. Pt stated he slept well and denied any si/hi/avh. Pt stated he slept well. Pt had questions about his medications pertaining to if they will make him "sleepy".  A: support and encouragement given. Medications questions given per protocol and standing orders. Pt's questions answered pertaining to medication admin. q15 minute checks continued. R: patient safe on the unit. Patient contracts for safety. Will continue to monitor.

## 2017-10-05 NOTE — Progress Notes (Signed)
[facility] MD Progress Note  10/05/2017 12:43 PM Anthony Luna  MRN:  [id number] Subjective: Per RN staff, he is engaging with peers more, affect is less flat and playing cards in the milieu.  Pt looks sedated today. He states that he does feel sleepy during the day. He states that overall he is feeling better. He has difficulty describing symptoms. He initially states that the voices are better but then later sates that they are louder but cannot describe them much. He states, "they talk about drugs and alcohol and stuff." He is sleeping better through the night. He is observed this morning interacting well with other peers and laughing and joking with them. He has not been observed responding to internal stimuli as much. He denies SI or thoughts of harming himself. Denies that the voices tell him to harm himself or others. He does feel the medications are helping him.   Principal Problem: Schizoaffective disorder (HCC) Diagnosis:   Patient Active Problem List   Diagnosis Date Noted  . Schizoaffective disorder (HCC) [F25.9] 09/25/2017  . Cannabis use disorder, severe, dependence (HCC) [F12.20] 11/30/2014  . Schizoaffective disorder, bipolar type (HCC) [F25.0] 11/30/2014  . Tobacco use disorder [F17.200] 11/30/2014   Total Time spent with patient: 20 minutes  Past Psychiatric History: See H&P  Past Medical History:  Past Medical History:  Diagnosis Date  . Depression    Since moving into new apartment 1 year ago  . None to low serum cortisol response with adrenocorticotrophic hormone (ACTH) stimulation test   . Schizophrenia, paranoid type (HCC)    History reviewed. No pertinent surgical history. Family History: History reviewed. No pertinent family history. Family Psychiatric  History: See H&P Social History:  Social History   Substance and Sexual Activity  Alcohol Use Yes  . Alcohol/week: 0.0 oz   Comment: "Special occasions"     Social History   Substance and Sexual Activity   Drug Use Yes  . Frequency: 4.0 times per week  . Types: Marijuana   Comment: "2 blunts"    Social History   Socioeconomic History  . Marital status: Single    Spouse name: Not on file  . Number of children: Not on file  . Years of education: Not on file  . Highest education level: Not on file  Occupational History  . Not on file  Social Needs  . Financial resource strain: Not on file  . Food insecurity:    Worry: Not on file    Inability: Not on file  . Transportation needs:    Medical: Not on file    Non-medical: Not on file  Tobacco Use  . Smoking status: Current Every Day Smoker    Packs/day: 0.50    Types: Cigarettes    Start date: 11/29/2002  . Smokeless tobacco: Former Neurosurgeon  . Tobacco comment: Patient wishes to quit today.  Substance and Sexual Activity  . Alcohol use: Yes    Alcohol/week: 0.0 oz    Comment: "Special occasions"  . Drug use: Yes    Frequency: 4.0 times per week    Types: Marijuana    Comment: "2 blunts"  . Sexual activity: Not on file  Lifestyle  . Physical activity:    Days per week: Not on file    Minutes per session: Not on file  . Stress: Not on file  Relationships  . Social connections:    Talks on phone: Not on file    Gets together: Not on file  Attends religious service: Not on file    Active member of club or organization: Not on file    Attends meetings of clubs or organizations: Not on file    Relationship status: Not on file  Other Topics Concern  . Not on file  Social History Narrative  . Not on file   Additional Social History:    Pain Medications: See PTA Prescriptions: See PTA Over the Counter: See PTA History of alcohol / drug use?: Yes Negative Consequences of Use: Financial, Personal relationships, Work / School Name of Substance 1: Alcohol 1 - Amount (size/oz): 2-3 Drinks 1 - Frequency: "Special occasions" 1 - Last Use / Amount: Last night (4/11)                  Sleep: Fair  Appetite:   Good  Current Medications: Current Facility-Administered Medications  Medication Dose Route Frequency Provider Last Rate Last Dose  . acetaminophen (TYLENOL) tablet 650 mg  650 mg Oral Q6H PRN [name], [name], MD      . alum & mag hydroxide-simeth (MAALOX/MYLANTA) 200-200-20 MG/5ML suspension 30 mL  30 mL Oral Q4H PRN [name], [name], MD      . amantadine (SYMMETREL) capsule 100 mg  100 mg Oral BID [name], [name], MD   100 mg at 10/05/17 1610  . [START ON 10/06/2017] cloZAPine (CLOZARIL) tablet 150 mg  150 mg Oral QHS Jamee Pacholski R, MD      . cloZAPine (CLOZARIL) tablet 75 mg  75 mg Oral Once Jashley Yellin, Jeanice Lim R, MD      . lithium carbonate (ESKALITH) CR tablet 450 mg  450 mg Oral Q12H [name], [name], MD   450 mg at 10/05/17 9604  . loratadine (CLARITIN) tablet 10 mg  10 mg Oral Daily Kalab Camps, [name], MD   10 mg at 10/05/17 5409  . LORazepam (ATIVAN) tablet 1 mg  1 mg Oral Q6H PRN [name], MD   1 mg at 10/04/17 2145  . magnesium hydroxide (MILK OF MAGNESIA) suspension 30 mL  30 mL Oral Daily PRN [name], [name], MD      . nicotine (NICODERM CQ - dosed in mg/24 hours) patch 21 mg  21 mg Transdermal Daily Beverly Sessions, MD   21 mg at 10/05/17 8119  . propranolol (INDERAL) tablet 10 mg  10 mg Oral TID [name], [name], MD   10 mg at 10/05/17 1225  . traZODone (DESYREL) tablet 100 mg  100 mg Oral QHS PRN [name], MD   100 mg at 10/04/17 2145    Lab Results: No results found for this or any previous visit (from the past 48 hour(s)).  Blood Alcohol level:  Lab Results  Component Value Date   ETH <10 09/25/2017   ETH <10 04/07/2017    Metabolic Disorder Labs: Lab Results  Component Value Date   HGBA1C 4.7 (L) 09/26/2017   MPG 88.19 09/26/2017   MPG 105.41 04/08/2017   Lab Results  Component Value Date   PROLACTIN 6.1 12/19/2014   PROLACTIN 1.3 (L) 11/30/2014   Lab Results  Component Value Date   CHOL 153 09/26/2017   TRIG 64 09/26/2017   HDL 55 09/26/2017   CHOLHDL  2.8 09/26/2017   VLDL 13 09/26/2017   LDLCALC 85 09/26/2017   LDLCALC 95 04/08/2017    Physical Findings: AIMS: Facial and Oral Movements Muscles of Facial Expression: None, normal Lips and Perioral Area: None, normal Jaw: None, normal Tongue: None,  normal,Extremity Movements Upper (arms, wrists, hands, fingers): None, normal Lower (legs, knees, ankles, toes): None, normal, Trunk Movements Neck, shoulders, hips: None, normal, Overall Severity Severity of abnormal movements (highest score from questions above): None, normal Incapacitation due to abnormal movements: None, normal Patient's awareness of abnormal movements (rate only patient's report): No Awareness, Dental Status Current problems with teeth and/or dentures?: No Does patient usually wear dentures?: No  CIWA:  CIWA-Ar Total: 1 COWS:  COWS Total Score: 1  Musculoskeletal: Strength & Muscle Tone: within normal limits Gait & Station: normal Patient leans: N/A  Psychiatric Specialty Exam: Physical Exam  Nursing note and vitals reviewed.   Review of Systems  All other systems reviewed and are negative.   Blood pressure 127/85, pulse 96, temperature 97.6 F (36.4 C), temperature source Oral, resp. rate 16, height 6' (1.829 m), weight 85.7 kg (189 lb), SpO2 98 %.Body mass index is 25.63 kg/m.  General Appearance: Casual  Eye Contact:  Good  Speech:  Clear and Coherent  Volume:  Normal  Mood:  Euthymic  Affect:  Appropriate  Thought Process:  Coherent and Goal Directed  Orientation:  Full (Time, Place, and Person)  Thought Content:  Logical  Suicidal Thoughts:  No  Homicidal Thoughts:  No  Memory:  Recent;   Fair  Judgement:  Fair  Insight:  Fair  Psychomotor Activity:  Normal  Concentration:  Concentration: Fair  Recall:  [employer]is[employer]Fair  Fund of Knowledge:  Fair  Language:  Fair  Akathisia:  No      Assets:  Resilience  ADL's:  Intact  Cognition:  WNL  Sleep:  Number of Hours: 5.3     Treatment Plan  Summary: 32 yo male admitted due to worsening AH. He feels clozapine is helping with voices. He is sleeping better and responding to internal stimuli less. He is much more engaging in conversation than last week. He is interacting well with peers. He looks a little sedated today so will switch Clozapine to bedtime dosing.   Plan:  Schizoaffective disorder -Continue Clozapine but switch to bedtime dosing. Continue total daily dose of 150 mg  -Continue Lithium 450 mg BID  Dispo -He will return to his apartment on discharge. He will follow up with his CST team. I attempted to contact Dr. Omelia BlackwaterHEaden to discuss initiation of Clozapine but he is on vacation until next weekend.    Haskell RilingHolly R Donyel Nester, MD 10/05/2017, 12:43 PM

## 2017-10-06 NOTE — Progress Notes (Signed)
Patient ID: Anthony BarthelBrandon Wayne Luna, male   DOB: 06/17/1985, 32 y.o.   MRN: [id number] Visible, mood and affect appropriate, respectful and polite, interacting and engaging more with peers, descent in appearance body upkeep, denied pain, denied SI/HI/AVH. Complied with bedtime medications as scheduled.

## 2017-10-06 NOTE — [facility] Group Notes (Signed)
CSW Group Therapy Note  10/06/2017  Time:  0900  Type of Therapy and Topic: Group Therapy: Goals Group: SMART Goals    Participation Level:  Active    Description of Group:   The purpose of a daily goals group is to assist and guide patients in setting recovery/wellness-related goals. The objective is to set goals as they relate to the crisis in which they were admitted. Patients will be using SMART goal modalities to set measurable goals. Characteristics of realistic goals will be discussed and patients will be assisted in setting and processing how one will reach their goal. Facilitator will also assist patients in applying interventions and coping skills learned in psycho-education groups to the SMART goal and process how one will achieve defined goal.    Therapeutic Goals:  -Patients will develop and document one goal related to or their crisis in which brought them into treatment.  -Patients will be guided by LCSW using SMART goal setting modality in how to set a measurable, attainable, realistic and time sensitive goal.  -Patients will process barriers in reaching goal.  -Patients will process interventions in how to overcome and successful in reaching goal.    Patient's Goal:  Pt continues to work towards their tx goals but has not yet reached them. Pt was able to appropriately participate in group discussion, and was able to offer support/validation to other group members. Pt reported his goal for  the day is to, "learn at least two new coping skills I can use to stop smoking when I leave the hospital, by the end of today."   Therapeutic Modalities:  Motivational Interviewing  Cognitive Behavioral Therapy  Crisis Intervention Model  SMART goals setting  Heidi DachKelsey Flois Mctague, MSW, LCSW Clinical Social Worker 10/06/2017 9:46 AM

## 2017-10-06 NOTE — [facility] Group Notes (Signed)
10/06/2017 1PM  Type of Therapy/Topic:  Group Therapy:  Feelings about Diagnosis  Participation Level:  Active   Description of Group:   This group will allow patients to explore their thoughts and feelings about diagnoses they have received. Patients will be guided to explore their level of understanding and acceptance of these diagnoses. Facilitator will encourage patients to process their thoughts and feelings about the reactions of others to their diagnosis and will guide patients in identifying ways to discuss their diagnosis with significant others in their lives. This group will be process-oriented, with patients participating in exploration of their own experiences, giving and receiving support, and processing challenge from other group members.   Therapeutic Goals: 1. Patient will demonstrate understanding of diagnosis as evidenced by identifying two or more symptoms of the disorder 2. Patient will be able to express two feelings regarding the diagnosis 3. Patient will demonstrate their ability to communicate their needs through discussion and/or role play  Summary of Patient Progress: Actively and appropriately engaged in the group. Patient was able to provide support and validation to other group members.Patient practiced active listening when interacting with the facilitator and other group members. Apolinar JunesBrandon says that he needs to work on being more positive in his life with managing his diagnosis. He reports that he uses prayer as a Associate Professorcoping skill. Patient in still in the process of obtaining treatment goals.        Therapeutic Modalities:   Cognitive Behavioral Therapy Brief Therapy Feelings Identification    Lillette BoxerCassandra  Venus Ruhe, LCSW 10/06/2017 2:37 PM

## 2017-10-06 NOTE — Progress Notes (Signed)
Recreation Therapy Notes  Date: 10/06/2017  Time: 9:30 am  Location: Craft Room  Behavioral response: Appropriate  Intervention Topic: Self-esteem  Discussion/Intervention:  Group content today was focused on self-esteem. Patient defined self-esteem and where it comes form. The group described reasons self-esteem is important. Individuals stated things that impact self-esteem and positive ways to improve self-esteem. The group participated in the intervention "Collage of Me" where patients were able to create a collage of positive things that makes them who they are. Clinical Observations/Feedback:  Patient came to group and defined self-esteem as confidence in yourself. Individual expressed that he encourages himself to improve his self-esteem.He stated self-esteem is important because it is how people feel about themselves. Patient participated in the intervention and was social with peers and staff during group.   Anthony Luna LRT/CTRS         Anthony Luna 10/06/2017 11:35 AM

## 2017-10-06 NOTE — Plan of Care (Signed)
Patient slept for Estimated Hours of 8; Precautionary checks every 15 minutes for safety maintained, room free of safety hazards, patient sustains no injury or falls during this shift.  Problem: Education: Goal: Utilization of techniques to improve thought processes will improve Outcome: Progressing Goal: Knowledge of the prescribed therapeutic regimen will improve Outcome: Progressing   Problem: Coping: Goal: Coping ability will improve Outcome: Progressing Goal: Will verbalize feelings Outcome: Progressing   Problem: Self-Concept: Goal: Level of anxiety will decrease Outcome: Progressing   Problem: Education: Goal: Knowledge of disease or condition will improve Outcome: Progressing   Problem: Safety: Goal: Ability to remain free from injury will improve Outcome: Progressing   Problem: Education: Goal: Will be free of psychotic symptoms Outcome: Progressing   Problem: Coping: Goal: Will verbalize feelings Outcome: Progressing   Problem: Coping: Goal: Level of anxiety will decrease Outcome: Progressing

## 2017-10-06 NOTE — Progress Notes (Signed)
[facility]  10/06/2017 3:49 PM Anthony Luna  MRN:  [id number] Subjective:  Pt states that he is feeling well today. He continue to hear voices but they are getting much quieter. He states that he is sleeping well. He has been responding to internal stimuli less on the unit. He appears much less sedated today an is glad the medication was switched to nighttime. He states taht he is eating well. Denies SI, HI, VH. He denies that [facility] tell him to harm himself or others. He is organized and goal directed. Affect is brighter. He is very pleasant and polite.   Principal Problem: Schizoaffective disorder (HCC) Diagnosis:   Patient Active Problem List   Diagnosis Date Noted  . Schizoaffective disorder (HCC) [F25.9] 09/25/2017  . Cannabis use disorder, severe, dependence (HCC) [F12.20] 11/30/2014  . Schizoaffective disorder, bipolar type (HCC) [F25.0] 11/30/2014  . Tobacco use disorder [F17.200] 11/30/2014   Total Time spent with patient: 20 minutes  Past Psychiatric History: See H&p  Past Medical History:  Past Medical History:  Diagnosis Date  . Depression    Since moving into new apartment 1 year ago  . None to low serum cortisol response with adrenocorticotrophic hormone (ACTH) stimulation test   . Schizophrenia, paranoid type (HCC)    History reviewed. No pertinent surgical history. Family History: History reviewed. No pertinent family history. Family Psychiatric  History: See H&P Social History:  Social History   Substance and Sexual Activity  Alcohol Use Yes  . Alcohol/week: 0.0 oz   Comment: "Special occasions"     Social History   Substance and Sexual Activity  Drug Use Yes  . Frequency: 4.0 times per week  . Types: Marijuana   Comment: "2 blunts"    Social History   Socioeconomic History  . Marital status: Single    Spouse name: Not on file  . Number of children: Not on file  . Years of education: Not on file  . Highest education level: Not on  file  Occupational History  . Not on file  Social Needs  . Financial resource strain: Not on file  . Food insecurity:    Worry: Not on file    Inability: Not on file  . Transportation needs:    Medical: Not on file    Non-medical: Not on file  Tobacco Use  . Smoking status: Current Every Day Smoker    Packs/day: 0.50    Types: Cigarettes    Start date: 11/29/2002  . Smokeless tobacco: Former NeurosurgeonUser  . Tobacco comment: Patient wishes to quit today.  Substance and Sexual Activity  . Alcohol use: Yes    Alcohol/week: 0.0 oz    Comment: "Special occasions"  . Drug use: Yes    Frequency: 4.0 times per week    Types: Marijuana    Comment: "2 blunts"  . Sexual activity: Not on file  Lifestyle  . Physical activity:    Days per week: Not on file    Minutes per session: Not on file  . Stress: Not on file  Relationships  . Social connections:    Talks on phone: Not on file    Gets together: Not on file    Attends religious service: Not on file    Active member of club or organization: Not on file    Attends meetings of clubs or organizations: Not on file    Relationship status: Not on file  Other Topics Concern  . Not on file  Social History Narrative  . Not on file   Additional Social History:    Pain Medications: See PTA Prescriptions: See PTA Over the Counter: See PTA History of alcohol / drug use?: Yes Negative Consequences of Use: Financial, Personal relationships, Work / School Name of Substance 1: Alcohol 1 - Amount (size/oz): 2-3 Drinks 1 - Frequency: "Special occasions" 1 - Last Use / Amount: Last night (4/11)                  Sleep: Good  Appetite:  Good  Current Medications: Current Facility-Administered Medications  Medication Dose Route Frequency Provider Last Rate Last Dose  . acetaminophen (TYLENOL) tablet 650 mg  650 mg Oral Q6H PRN [name], [name], MD      . alum & mag hydroxide-simeth (MAALOX/MYLANTA) 200-200-20 MG/5ML suspension 30 mL  30  mL Oral Q4H PRN [name], [name], MD      . amantadine (SYMMETREL) capsule 100 mg  100 mg Oral BID [name], [name], MD   100 mg at 10/06/17 0854  . cloZAPine (CLOZARIL) tablet 150 mg  150 mg Oral QHS [name], [name], MD      . lithium carbonate (ESKALITH) CR tablet 450 mg  450 mg Oral Q12H [name], [name], MD   450 mg at 10/06/17 0856  . loratadine (CLARITIN) tablet 10 mg  10 mg Oral Daily [name], [name], MD   10 mg at 10/06/17 0855  . LORazepam (ATIVAN) tablet 1 mg  1 mg Oral Q6H PRN [name], MD   1 mg at 10/04/17 2145  . magnesium hydroxide (MILK OF MAGNESIA) suspension 30 mL  30 mL Oral Daily PRN [name], [name], MD      . nicotine (NICODERM CQ - dosed in mg/24 hours) patch 21 mg  21 mg Transdermal Daily Beverly Sessions, MD   21 mg at 10/05/17 1610  . propranolol (INDERAL) tablet 10 mg  10 mg Oral TID [name], [name], MD   10 mg at 10/06/17 1209  . traZODone (DESYREL) tablet 100 mg  100 mg Oral QHS PRN [name], MD   100 mg at 10/04/17 2145    Lab Results: No results found for this or any previous visit (from the past 48 hour(s)).  Blood Alcohol level:  Lab Results  Component Value Date   ETH <10 09/25/2017   ETH <10 04/07/2017    Metabolic Disorder Labs: Lab Results  Component Value Date   HGBA1C 4.7 (L) 09/26/2017   MPG 88.19 09/26/2017   MPG 105.41 04/08/2017   Lab Results  Component Value Date   PROLACTIN 6.1 12/19/2014   PROLACTIN 1.3 (L) 11/30/2014   Lab Results  Component Value Date   CHOL 153 09/26/2017   TRIG 64 09/26/2017   HDL 55 09/26/2017   CHOLHDL 2.8 09/26/2017   VLDL 13 09/26/2017   LDLCALC 85 09/26/2017   LDLCALC 95 04/08/2017    Physical Findings: AIMS: Facial and Oral Movements Muscles of Facial Expression: None, normal Lips and Perioral Area: None, normal Jaw: None, normal Tongue: None, normal,Extremity Movements Upper (arms, wrists, hands, fingers): None, normal Lower (legs, knees, ankles, toes): None, normal, Trunk  Movements Neck, shoulders, hips: None, normal, Overall Severity Severity of abnormal movements (highest score from questions above): None, normal Incapacitation due to abnormal movements: None, normal Patient's awareness of abnormal movements (rate only patient's report): No Awareness, Dental Status Current problems with teeth and/or dentures?: No Does patient usually wear dentures?: No  CIWA:  CIWA-Ar Total: 1 COWS:  COWS Total Score: 1  Musculoskeletal: Strength & Muscle Tone: within normal limits Gait & Station: normal Patient leans: N/A  Psychiatric Specialty Exam: Physical Exam  Nursing note and vitals reviewed.   Review of Systems  All other systems reviewed and are negative.   Blood pressure 123/85, pulse 84, temperature 98.5 F (36.9 C), temperature source Oral, resp. rate 20, height 6' (1.829 m), weight 85.7 kg (189 lb), SpO2 99 %.Body mass index is 25.63 kg/m.  General Appearance: Casual  Eye Contact:  Fair  Speech:  Clear and Coherent  Volume:  Normal  Mood:  Euthymic  Affect:  Appropriate  Thought Process:  Coherent and Goal Directed  Orientation:  Full (Time, Place, and Person)  Thought Content:  Logical  Suicidal Thoughts:  No  Homicidal Thoughts:  No  Memory:  Immediate;   Fair  Judgement:  Fair  Insight:  Fair  Psychomotor Activity:  Normal  Concentration:  Concentration: Fair  Recall:  [employer] of Knowledge:  Fair  Language:  Fair  Akathisia:  No      Assets:  Resilience  ADL's:  Intact  Cognition:  WNL  Sleep:  Number of Hours: 8     Treatment Plan Summary: 32 yo male admitted due to psychosis. He is being titrated on Clozapine and is much less sedated today since moving to nighttime dosing. Voices are improving and he is much less withdrawn. He is interacting better with peers.   Plan:  Schizoaffective disorder -Continue Clozapine. He will get 150 mg qhs. Continue to titrate as tolerated -Continue Lithium 450 mg BID -He is also on  [employer] -he will return to apartment on discharge. He will follow up with [facility].  [name], MD 10/06/2017, 3:49 PM

## 2017-10-06 NOTE — Progress Notes (Addendum)
D:Patient  remains to have altered  thought process.  Information given to patient  very concrete  for better understanding . Instruction given on medication,  cognitively patient has difficulty understanding .continue  to work on coping skills , decrease anxiety Patient stated slept good last night .Stated appetite fair and energy level  Is normal. Stated concentration is good . Stated on Depression scale 0, hopeless 0 and anxiety 0 .( low 0-10 high) Denies suicidal  homicidal ideations  .  No auditory hallucinations  No pain concerns . Appropriate ADL'S. Interacting with peers and staff.  A: Encourage patient participation with unit programming . Instruction  Given on  Medication , verbalize understanding. R: Voice no other concerns. Staff continue to monitor

## 2017-10-06 NOTE — [facility] Group Notes (Signed)
[facility] Group Notes:  (Nursing/MHT/Case Management/Adjunct)  Date:  10/06/2017  Time:  8:59 PM  Type of Therapy:  Group Therapy  Participation Level:  Active  Participation Quality:  Appropriate  Affect:  Appropriate  Cognitive:  Alert  Insight:  Good  Engagement in Group:  Engaged  Modes of Intervention:  Support  Summary of Progress/Problems:  Mayra NeerJackie L Geniece Akers 10/06/2017, 8:59 PM

## 2017-10-06 NOTE — Plan of Care (Signed)
Patient  remains to have altered  thought process.  Information given to patient  very concrete  for better understanding . Instruction given on medication,  cognitively patient has difficulty understanding .continue  to work on Pharmacologistcoping skills , decrease anxiety    Problem: Education: Goal: Utilization of techniques to improve thought processes will improve Outcome: Progressing Goal: Knowledge of the prescribed therapeutic regimen will improve Outcome: Progressing   Problem: Coping: Goal: Coping ability will improve Outcome: Progressing Goal: Will verbalize feelings Outcome: Progressing   Problem: Self-Concept: Goal: Level of anxiety will decrease Outcome: Progressing   Problem: Education: Goal: Knowledge of disease or condition will improve Outcome: Progressing   Problem: Safety: Goal: Ability to remain free from injury will improve Outcome: Progressing   Problem: Education: Goal: Will be free of psychotic symptoms Outcome: Progressing   Problem: Coping: Goal: Will verbalize feelings Outcome: Progressing   Problem: Coping: Goal: Level of anxiety will decrease Outcome: Progressing  ..Marland Kitchen

## 2017-10-07 LAB — CBC WITH DIFFERENTIAL/PLATELET
BASOS ABS: 0 10*3/uL (ref 0–0.1)
BASOS PCT: 0 %
EOS ABS: 0.3 10*3/uL (ref 0–0.7)
EOS PCT: 5 %
HCT: 47.4 % (ref 40.0–52.0)
Hemoglobin: 16.4 g/dL (ref 13.0–18.0)
LYMPHS ABS: 1.9 10*3/uL (ref 1.0–3.6)
Lymphocytes Relative: 27 %
MCH: 31.8 pg (ref 26.0–34.0)
MCHC: 34.5 g/dL (ref 32.0–36.0)
MCV: 92.1 fL (ref 80.0–100.0)
Monocytes Absolute: 0.9 10*3/uL (ref 0.2–1.0)
Monocytes Relative: 13 %
Neutro Abs: 3.9 10*3/uL (ref 1.4–6.5)
Neutrophils Relative %: 55 %
PLATELETS: 189 10*3/uL (ref 150–440)
RBC: 5.15 MIL/uL (ref 4.40–5.90)
RDW: 13 % (ref 11.5–14.5)
WBC: 7.1 10*3/uL (ref 3.8–10.6)

## 2017-10-07 MED ORDER — CLOZAPINE 25 MG PO TABS
175.0000 mg | ORAL_TABLET | Freq: Every day | ORAL | Status: DC
  Administered 2017-10-07 – 2017-10-08 (×2): 175 mg via ORAL
  Filled 2017-10-07 (×2): qty 1

## 2017-10-07 MED ORDER — CLOZAPINE 100 MG PO TABS: 200.0000 mg | ORAL_TABLET | Freq: Every day | ORAL | Status: DC

## 2017-10-07 NOTE — Progress Notes (Signed)
Recreation Therapy Notes  Date: 10/07/2017  Time: 9:30 am   Location: Craft Room   Behavioral response: N/A   Intervention Topic: Relaxation  Discussion/Intervention: Patient did not attend group.   Clinical Observations/Feedback:  Patient did not attend group.   Anara Cowman LRT/CTRS        Tonye Tancredi 10/07/2017 11:41 AM 

## 2017-10-07 NOTE — Plan of Care (Signed)
Patient is comfortable and calm, attends groups and participating in social activities with peers, patient voice no complain, aware of coping skills and able voice and identify positive attributes of self, improving in all areas, no distress noted. Problem: Education: Goal: Utilization of techniques to improve thought processes will improve Outcome: Progressing Goal: Knowledge of the prescribed therapeutic regimen will improve Outcome: Progressing   Problem: Coping: Goal: Coping ability will improve Outcome: Progressing Goal: Will verbalize feelings Outcome: Progressing   Problem: Self-Concept: Goal: Level of anxiety will decrease Outcome: Progressing   Problem: Education: Goal: Knowledge of disease or condition will improve Outcome: Progressing   Problem: Safety: Goal: Ability to remain free from injury will improve Outcome: Progressing   Problem: Education: Goal: Will be free of psychotic symptoms Outcome: Progressing   Problem: Coping: Goal: Will verbalize feelings Outcome: Progressing   Problem: Coping: Goal: Level of anxiety will decrease Outcome: Progressing

## 2017-10-07 NOTE — Progress Notes (Signed)
Patient  remains to have altered  thought process.  Information given to patient  very concrete  for better understanding . Instruction given on medication,  cognitively patient has difficulty understanding .continue  to work on coping skills , decrease anxiety D: Patient stated slept good last night .Stated appetite fair and energy level  Is normal. Stated concentration is good . Stated on Depression scale 0 , hopeless 0 and anxiety 3 .( low 0-10 high) Denies suicidal  homicidal ideations  .  No auditory hallucinations  No pain concerns . Appropriate ADL'S. Interacting with peers and staff.  Able to state to writer walking was coping skills for him A: Encourage patient participation with unit programming . Instruction  Given on  Medication , verbalize understanding. R: Voice no other concerns. Staff continue to monitor

## 2017-10-07 NOTE — [facility] Group Notes (Signed)
LCSW Group Therapy Note  10/07/2017 1:00pm  Type of Therapy/Topic:  Group Therapy:  Emotion Regulation  Participation Level:  Active   Description of Group:   The purpose of this group is to assist patients in learning to regulate negative emotions and experience positive emotions. Patients will be guided to discuss ways in which they have been vulnerable to their negative emotions. These vulnerabilities will be juxtaposed with experiences of positive emotions or situations, and patients will be challenged to use positive emotions to combat negative ones. Special emphasis will be placed on coping with negative emotions in conflict situations, and patients will process healthy conflict resolution skills.  Therapeutic Goals: 1. Patient will identify two positive emotions or experiences to reflect on in order to balance out negative emotions 2. Patient will label two or more emotions that they find the most difficult to experience 3. Patient will demonstrate positive conflict resolution skills through discussion and/or role plays  Summary of Patient Progress: Pt able to meet therapeutic goals. Verbalizes depression and isolation as difficult emotions to feel.  Says he tries to focus on people and things that make him feel like he belongs or is loved.  Therapeutic Modalities:   Cognitive Behavioral Therapy Feelings Identification Dialectical Behavioral Therapy   Glennon MacSara P Deloris Moger, LCSW 10/07/2017 5:11 PM

## 2017-10-07 NOTE — Progress Notes (Signed)
Clozapine monitoring Consult   32 yo male ordered clozapine 12.5 mg PO bid  04/17 ANC 4300  Information entered into Clozapine registry and pt eligible to receive clozapine Next labs due in a week - ordered for 04/24  4/24 labs entered into [facility]. Continue weekly monitoring per protocol.  Pharmacy will continue to follow.   Falyn Rubel A. [place], [place]., BCPS Clinical Pharmacist 10/07/17 10:43

## 2017-10-07 NOTE — Progress Notes (Signed)
[facility] MD Progress Note  10/07/2017 3:27 PM Anthony Luna  MRN:  [id number]   Subjective:  Pt states that he is dong better each day. He is sleeping well. He states that the voices are still there but are getting much quieter. He has brighter affect and smiling on the unit. He interacts better with peers. He denies SI or thoughts of harming others. He denies command hallucinations. He is tolerating Clozapine titration.   Principal Problem: Schizoaffective disorder (HCC) Diagnosis:   Patient Active Problem List   Diagnosis Date Noted  . Schizoaffective disorder (HCC) [F25.9] 09/25/2017  . Cannabis use disorder, severe, dependence (HCC) [F12.20] 11/30/2014  . Schizoaffective disorder, bipolar type (HCC) [F25.0] 11/30/2014  . Tobacco use disorder [F17.200] 11/30/2014   Total Time spent with patient: 20 minutes  Past Psychiatric History: See H&p  Past Medical History:  Past Medical History:  Diagnosis Date  . Depression    Since moving into new apartment 1 year ago  . None to low serum cortisol response with adrenocorticotrophic hormone (ACTH) stimulation test   . Schizophrenia, paranoid type (HCC)    History reviewed. No pertinent surgical history. Family History: History reviewed. No pertinent family history. Family Psychiatric  History: See H&P Social History:  Social History   Substance and Sexual Activity  Alcohol Use Yes  . Alcohol/week: 0.0 oz   Comment: "Special occasions"     Social History   Substance and Sexual Activity  Drug Use Yes  . Frequency: 4.0 times per week  . Types: Marijuana   Comment: "2 blunts"    Social History   Socioeconomic History  . Marital status: Single    Spouse name: Not on file  . Number of children: Not on file  . Years of education: Not on file  . Highest education level: Not on file  Occupational History  . Not on file  Social Needs  . Financial resource strain: Not on file  . Food insecurity:    Worry: Not on file   Inability: Not on file  . Transportation needs:    Medical: Not on file    Non-medical: Not on file  Tobacco Use  . Smoking status: Current Every Day Smoker    Packs/day: 0.50    Types: Cigarettes    Start date: 11/29/2002  . Smokeless tobacco: Former Neurosurgeon  . Tobacco comment: Patient wishes to quit today.  Substance and Sexual Activity  . Alcohol use: Yes    Alcohol/week: 0.0 oz    Comment: "Special occasions"  . Drug use: Yes    Frequency: 4.0 times per week    Types: Marijuana    Comment: "2 blunts"  . Sexual activity: Not on file  Lifestyle  . Physical activity:    Days per week: Not on file    Minutes per session: Not on file  . Stress: Not on file  Relationships  . Social connections:    Talks on phone: Not on file    Gets together: Not on file    Attends religious service: Not on file    Active member of club or organization: Not on file    Attends meetings of clubs or organizations: Not on file    Relationship status: Not on file  Other Topics Concern  . Not on file  Social History Narrative  . Not on file   Additional Social History:    Pain Medications: See PTA Prescriptions: See PTA Over the Counter: See PTA History of alcohol /  drug use?: Yes Negative Consequences of Use: Financial, Personal relationships, Work / School Name of Substance 1: Alcohol 1 - Amount (size/oz): 2-3 Drinks 1 - Frequency: "Special occasions" 1 - Last Use / Amount: Last night (4/11)                  Sleep: Good  Appetite:  Good  Current Medications: Current Facility-Administered Medications  Medication Dose Route Frequency Provider Last Rate Last Dose  . acetaminophen (TYLENOL) tablet 650 mg  650 mg Oral Q6H PRN [name], [name], MD      . alum & mag hydroxide-simeth (MAALOX/MYLANTA) 200-200-20 MG/5ML suspension 30 mL  30 mL Oral Q4H PRN [name], [name], MD      . amantadine (SYMMETREL) capsule 100 mg  100 mg Oral BID [name], [name], MD   100 mg at 10/07/17 0820  .  cloZAPine (CLOZARIL) tablet 175 mg  175 mg Oral QHS Wyatte Dames R, MD      . lithium carbonate (ESKALITH) CR tablet 450 mg  450 mg Oral Q12H [name], [name], MD   450 mg at 10/07/17 0820  . loratadine (CLARITIN) tablet 10 mg  10 mg Oral Daily Zorina Mallin, Ileene HutchinsonHolly R, MD   10 mg at 10/07/17 0820  . LORazepam (ATIVAN) tablet 1 mg  1 mg Oral Q6H PRN Haskell RilingMcNew, Cobie Leidner R, MD   1 mg at 10/04/17 2145  . magnesium hydroxide (MILK OF MAGNESIA) suspension 30 mL  30 mL Oral Daily PRN [name], [name], MD      . nicotine (NICODERM CQ - dosed in mg/24 hours) patch 21 mg  21 mg Transdermal Daily [name], Jagannath, MD   21 mg at 10/05/17 [id number]  . propranolol (INDERAL) tablet 10 mg  10 mg Oral TID [name], [name], MD   10 mg at 10/07/17 1158  . traZODone (DESYREL) tablet 100 mg  100 mg Oral QHS PRN Haskell RilingMcNew, Indya Oliveria R, MD   100 mg at 10/04/17 2145    Lab Results:  Results for orders placed or performed during the hospital encounter of 09/25/17 (from the past 48 hour(s))  CBC with Differential/Platelet     Status: None   Collection Time: 10/07/17  6:58 AM  Result Value Ref Range   WBC 7.1 3.8 - 10.6 K/uL   RBC 5.15 4.40 - 5.90 MIL/uL   Hemoglobin 16.4 13.0 - 18.0 g/dL   HCT 13.247.4 44.040.0 - 10.252.0 %   MCV 92.1 80.0 - 100.0 fL   MCH 31.8 26.0 - 34.0 pg   MCHC 34.5 32.0 - 36.0 g/dL   RDW 72.513.0 36.611.5 - 44.014.5 %   Platelets 189 150 - 440 K/uL   Neutrophils Relative % 55 %   Neutro Abs 3.9 1.4 - 6.5 K/uL   Lymphocytes Relative 27 %   Lymphs Abs 1.9 1.0 - 3.6 K/uL   Monocytes Relative 13 %   Monocytes Absolute 0.9 0.2 - 1.0 K/uL   Eosinophils Relative 5 %   Eosinophils Absolute 0.3 0 - 0.7 K/uL   Basophils Relative 0 %   Basophils Absolute 0.0 0 - 0.1 K/uL    Comment: Performed at [facility], [address] [place], [place] [id number]    Blood Alcohol level:  Lab Results  Component Value Date   [facility]   ETH <10 04/07/2017    Metabolic Disorder Labs: Lab Results  Component Value Date   HGBA1C 4.7 (L)  09/26/2017   MPG 88.19 09/26/2017   MPG 105.41  04/08/2017   Lab Results  Component Value Date   PROLACTIN 6.1 12/19/2014   PROLACTIN 1.3 (L) 11/30/2014   Lab Results  Component Value Date   CHOL 153 09/26/2017   TRIG 64 09/26/2017   HDL 55 09/26/2017   CHOLHDL 2.8 09/26/2017   VLDL 13 09/26/2017   LDLCALC 85 09/26/2017   LDLCALC 95 04/08/2017    Physical Findings: AIMS: Facial and Oral Movements Muscles of Facial Expression: None, normal Lips and Perioral Area: None, normal Jaw: None, normal Tongue: None, normal,Extremity Movements Upper (arms, wrists, hands, fingers): None, normal Lower (legs, knees, ankles, toes): None, normal, Trunk Movements Neck, shoulders, hips: None, normal, Overall Severity Severity of abnormal movements (highest score from questions above): None, normal Incapacitation due to abnormal movements: None, normal Patient's awareness of abnormal movements (rate only patient's report): No Awareness, Dental Status Current problems with teeth and/or dentures?: No Does patient usually wear dentures?: No  CIWA:  CIWA-Ar Total: 1 COWS:  COWS Total Score: 1  Musculoskeletal: Strength & Muscle Tone: within normal limits Gait & Station: normal Patient leans: N/A  Psychiatric Specialty Exam: Physical Exam  Nursing note and vitals reviewed.   Review of Systems  Cardiovascular: Negative for chest pain.  All other systems reviewed and are negative.   Blood pressure 112/79, pulse 93, temperature 98.1 F (36.7 C), temperature source Oral, resp. rate 18, height 6' (1.829 m), weight 85.7 kg (189 lb), SpO2 97 %.Body mass index is 25.63 kg/m.  General Appearance: Casual  Eye Contact:  Good  Speech:  Clear and Coherent  Volume:  Normal  Mood:  Euthymic  Affect:  Appropriate  Thought Process:  Coherent and Goal Directed  Orientation:  Full (Time, Place, and Person)  Thought Content:  Logical, AH  Suicidal Thoughts:  No  Homicidal Thoughts:  No  Memory:   Immediate;   Fair  Judgement:  Fair  Insight:  Fair  Psychomotor Activity:  Normal  Concentration:  Concentration: Fair  Recall:  [employer] of Knowledge:  Fair  Language:  Fair  Akathisia:  No      Assets:  Resilience  ADL's:  Intact  Cognition:  WNL  Sleep:  Number of Hours: 8.3     Treatment Plan Summary: 32 yo male admitted due to psychosis. He is being titrated on Clozapine and tolerating it well so far. Voices are improving. His affect is brighter.  Plan:  Schizoaffective disorder -Increase Clozapine to 175 mg qhs. ANC today was 3.9 -Continue Lithium 450 mg BID. Will check level on Friday morning -He is also on [employer] -He will return to his apartment on discharge and follow up with CST and Dr. Ivor Costa, MD 10/07/2017, 3:27 PM

## 2017-10-07 NOTE — Plan of Care (Signed)
Patient  remains to have altered  thought process.  Information given to patient  very concrete  for better understanding . Instruction given on medication,  cognitively patient has difficulty understanding .continue  to work on Pharmacologistcoping skills , decrease anxiety   Problem: Education: Goal: Utilization of techniques to improve thought processes will improve Outcome: Progressing Goal: Knowledge of the prescribed therapeutic regimen will improve Outcome: Progressing

## 2017-10-07 NOTE — Tx Team (Signed)
Interdisciplinary Treatment and Diagnostic Plan Update  10/07/2017 Time of Session: [address] Anthony Luna MRN: [id number]  Principal Diagnosis: Schizoaffective disorder [facility](HCC)  Secondary Diagnoses: Principal Problem:   Schizoaffective disorder (HCC)   Current Medications:  Current Facility-Administered Medications  Medication Dose Route Frequency Provider Last Rate Last Dose  . acetaminophen (TYLENOL) tablet 650 mg  650 mg Oral Q6H PRN [name], [name], MD      . alum & mag hydroxide-simeth (MAALOX/MYLANTA) 200-200-20 MG/5ML suspension 30 mL  30 mL Oral Q4H PRN [name], [name], MD      . amantadine (SYMMETREL) capsule 100 mg  100 mg Oral BID [name], [name], MD   100 mg at 10/07/17 0820  . cloZAPine (CLOZARIL) tablet 150 mg  150 mg Oral QHS [name], Ileene HutchinsonHolly R, MD   150 mg at 10/06/17 2111  . lithium carbonate (ESKALITH) CR tablet 450 mg  450 mg Oral Q12H [name], [name], MD   450 mg at 10/07/17 0820  . loratadine (CLARITIN) tablet 10 mg  10 mg Oral Daily [name], Ileene HutchinsonHolly R, MD   10 mg at 10/07/17 0820  . LORazepam (ATIVAN) tablet 1 mg  1 mg Oral Q6H PRN Haskell RilingMcNew, [name], MD   1 mg at 10/04/17 2145  . magnesium hydroxide (MILK OF MAGNESIA) suspension 30 mL  30 mL Oral Daily PRN [name], [name], MD      . nicotine (NICODERM CQ - dosed in mg/24 hours) patch 21 mg  21 mg Transdermal Daily [name], Jagannath, MD   21 mg at 10/05/17 [id number]  . propranolol (INDERAL) tablet 10 mg  10 mg Oral TID [name], [name], MD   10 mg at 10/07/17 0819  . traZODone (DESYREL) tablet 100 mg  100 mg Oral QHS PRN Haskell RilingMcNew, [name], MD   100 mg at 10/04/17 2145   PTA Medications: Medications Prior to Admission  Medication Sig Dispense Refill Last Dose  . amantadine (SYMMETREL) 100 MG capsule Take 1 capsule (100 mg total) by mouth 2 (two) times daily. 60 capsule 0 09/24/2017 at 1800  . amoxicillin (AMOXIL) 500 MG capsule Take 1 capsule (500 mg total) by mouth every 8 (eight) hours. (Patient not taking: Reported on 09/25/2017) 21  capsule 0 Not Taking at Unknown time  . benztropine (COGENTIN) 1 MG tablet Take 1 mg by mouth daily.   09/24/2017 at 2000  . clonazePAM (KLONOPIN) 0.5 MG tablet Take 1 tablet (0.5 mg total) by mouth at bedtime. (Patient not taking: Reported on 09/25/2017) 30 tablet 0 Not Taking at Unknown time  . fluPHENAZine (PROLIXIN) 10 MG tablet Take 1 tablet (10 mg total) by mouth 2 (two) times daily before a meal. 60 tablet 0 09/24/2017 at 1800  . hydrOXYzine (ATARAX/VISTARIL) 25 MG tablet Take 1 tablet (25 mg total) by mouth 3 (three) times daily as needed for itching. 15 tablet 0 prn at prn  . lithium carbonate (ESKALITH) 450 MG CR tablet Take 450 mg by mouth 2 (two) times daily.   09/24/2017 at 2000  . lithium carbonate (LITHOBID) 300 MG CR tablet Take 2 tablets (600 mg total) by mouth every 12 (twelve) hours. (Patient not taking: Reported on 09/25/2017) 120 tablet 1 Not Taking at Unknown time  . Paliperidone Palmitate (INVEGA TRINZA) 819 MG/2.625ML SUSP Inject 819 mg into the muscle.   Past Month at Unknown time  . propranolol (INDERAL) 10 MG tablet Take 1 tablet (10 mg total) by mouth 3 (three) times daily. 90 tablet 1 unknown at unknown  Patient Stressors: Educational concerns Health problems Substance abuse  Patient Strengths: Ability for insight Average or above average intelligence Capable of independent living Barrister's clerk for treatment/growth Religious Affiliation Supportive family/friends  Treatment Modalities: Medication Management, Group therapy, Case management,  1 to 1 session with clinician, Psychoeducation, Recreational therapy.   Physician Treatment Plan for Primary Diagnosis: Schizoaffective disorder (HCC) Long Term Goal(s): Improvement in symptoms so as ready for discharge Improvement in symptoms so as ready for discharge   Short Term Goals: Ability to identify changes in lifestyle to reduce recurrence of condition will improve Ability to verbalize feelings  will improve Ability to disclose and discuss suicidal ideas Ability to demonstrate self-control will improve Ability to identify and develop effective coping behaviors will improve Ability to maintain clinical measurements within normal limits will improve Compliance with prescribed medications will improve Ability to identify triggers associated with substance abuse/mental health issues will improve Ability to identify changes in lifestyle to reduce recurrence of condition will improve Ability to verbalize feelings will improve Ability to disclose and discuss suicidal ideas Ability to demonstrate self-control will improve Ability to identify and develop effective coping behaviors will improve Ability to maintain clinical measurements within normal limits will improve Compliance with prescribed medications will improve Ability to identify triggers associated with substance abuse/mental health issues will improve  Medication Management: Evaluate patient's response, side effects, and tolerance of medication regimen.  Therapeutic Interventions: 1 to 1 sessions, Unit Group sessions and Medication administration.  Evaluation of Outcomes: Progressing  Physician Treatment Plan for Secondary Diagnosis: Principal Problem:   Schizoaffective disorder (HCC)  Long Term Goal(s): Improvement in symptoms so as ready for discharge Improvement in symptoms so as ready for discharge   Short Term Goals: Ability to identify changes in lifestyle to reduce recurrence of condition will improve Ability to verbalize feelings will improve Ability to disclose and discuss suicidal ideas Ability to demonstrate self-control will improve Ability to identify and develop effective coping behaviors will improve Ability to maintain clinical measurements within normal limits will improve Compliance with prescribed medications will improve Ability to identify triggers associated with substance abuse/mental health issues  will improve Ability to identify changes in lifestyle to reduce recurrence of condition will improve Ability to verbalize feelings will improve Ability to disclose and discuss suicidal ideas Ability to demonstrate self-control will improve Ability to identify and develop effective coping behaviors will improve Ability to maintain clinical measurements within normal limits will improve Compliance with prescribed medications will improve Ability to identify triggers associated with substance abuse/mental health issues will improve     Medication Management: Evaluate patient's response, side effects, and tolerance of medication regimen.  Therapeutic Interventions: 1 to 1 sessions, Unit Group sessions and Medication administration.  Evaluation of Outcomes: Progressing   RN Treatment Plan for Primary Diagnosis: Schizoaffective disorder (HCC) Long Term Goal(s): Knowledge of disease and therapeutic regimen to maintain health will improve  Short Term Goals: Ability to participate in decision making will improve, Ability to identify and develop effective coping behaviors will improve and Compliance with prescribed medications will improve  Medication Management: RN will administer medications as ordered by provider, will assess and evaluate patient's response and provide education to patient for prescribed medication. RN will report any adverse and/or side effects to prescribing provider.  Therapeutic Interventions: 1 on 1 counseling sessions, Psychoeducation, Medication administration, Evaluate responses to treatment, Monitor vital signs and CBGs as ordered, Perform/monitor CIWA, COWS, AIMS and Fall Risk screenings as ordered, Perform wound care treatments as ordered.  Evaluation of Outcomes: Progressing   LCSW Treatment Plan for Primary Diagnosis: Schizoaffective disorder (HCC) Long Term Goal(s): Safe transition to appropriate next level of care at discharge, Engage patient in therapeutic  group addressing interpersonal concerns.  Short Term Goals: Engage patient in aftercare planning with referrals and resources, Increase emotional regulation and Increase skills for wellness and recovery  Therapeutic Interventions: Assess for all discharge needs, 1 to 1 time with Social worker, Explore available resources and support systems, Assess for adequacy in community support network, Educate family and significant other(s) on suicide prevention, Complete Psychosocial Assessment, Interpersonal group therapy.  Evaluation of Outcomes: Progressing   Progress in Treatment: Attending groups: Yes. Participating in groups: Yes. Taking medication as prescribed: Yes. Toleration medication: Yes. Family/Significant other contact made: No, will contact:  a support if pt provides consent. Patient understands diagnosis: Yes. Discussing patient identified problems/goals with staff: Yes. Medical problems stabilized or resolved: Yes. Denies suicidal/homicidal ideation: Yes. Issues/concerns per patient self-inventory: No. Other: None at this time.   New problem(s) identified: No, Describe:  none at this time.   New Short Term/Long Term Goal(s): Pt reported his goal for treatment is to, "get on the right medications, be myself, stay focused, and be responsible."  Discharge Plan or Barriers: CSW will continue to assess for appropriate discharge plan.   Reason for Continuation of Hospitalization: Hallucinations Medication stabilization  Estimated Length of Stay: 2 days    Attendees: Patient:  10/07/2017 11:27 AM  Physician: Dr. Johnella Moloney, MD 10/07/2017 11:27 AM  Nursing: Hulan Amato, RN 10/07/2017 11:27 AM  RN Care Manager: 10/07/2017 11:27 AM  Social Worker: Heidi Dach, LCSW 10/07/2017 11:27 AM  Recreational Therapist: Garret Reddish, CTRS-LRT 10/07/2017 11:27 AM  Other: Johny Shears, LCSWA 10/07/2017 11:27 AM  Other: Jake Shark, LCSW  10/07/2017 11:27 AM  Other: 10/07/2017 11:27 AM        Scribe for Treatment Team: Heidi Dach, LCSW 10/07/2017 11:37 AM

## 2017-10-08 MED ORDER — PROPRANOLOL HCL 10 MG PO TABS: 10.0000 mg | ORAL_TABLET | Freq: Three times a day (TID) | ORAL | 0 refills | Status: DC

## 2017-10-08 MED ORDER — CLOZAPINE 50 MG PO TABS: 175.0000 mg | ORAL_TABLET | Freq: Every day | ORAL | 0 refills | Status: DC

## 2017-10-08 MED ORDER — LITHIUM CARBONATE ER 450 MG PO TBCR: 450.0000 mg | EXTENDED_RELEASE_TABLET | Freq: Two times a day (BID) | ORAL | 0 refills | Status: DC

## 2017-10-08 MED ORDER — AMANTADINE HCL 100 MG PO CAPS: 100.0000 mg | ORAL_CAPSULE | Freq: Two times a day (BID) | ORAL | 0 refills | Status: DC

## 2017-10-08 NOTE — Progress Notes (Signed)
Patient is alert and oriented x 4, denies SI/HI/AVH no distress noted, affect is bright, interacting appropriately with peer and staff. Patient was given support and encouraged to attend evening group.Patient attends evening group, compliant with medication. 15 minutes safety rounds maintained, will continue to closely monitor.

## 2017-10-08 NOTE — [facility] Group Notes (Signed)
[facility] Group Notes:  (Nursing/MHT/Case Management/Adjunct)  Date:  10/08/2017  Time:  9:33 PM  Type of Therapy:  Group Therapy  Participation Level:  Active  Participation Quality:  Appropriate  Affect:  Appropriate  Cognitive:  Alert  Insight:  Good  Engagement in Group:  Engaged  Modes of Intervention:  Support  Summary of Progress/Problems:  Anthony Luna 10/08/2017, 9:33 PM

## 2017-10-08 NOTE — [facility] Group Notes (Signed)
LCSW Group Therapy Note 10/08/2017 9:00 AM  Type of Therapy and Topic:  Group Therapy:  Setting Goals  Participation Level:  Minimal  Description of Group: In this process group, patients discussed using strengths to work toward goals and address challenges.  Patients identified two positive things about themselves and one goal they were working on.  Patients were given the opportunity to share openly and support each other's plan for self-empowerment.  The group discussed the value of gratitude and were encouraged to have a daily reflection of positive characteristics or circumstances.  Patients were encouraged to identify a plan to utilize their strengths to work on current challenges and goals.  Therapeutic Goals 1. Patient will verbalize personal strengths/positive qualities and relate how these can assist with achieving desired personal goals 2. Patients will verbalize affirmation of peers plans for personal change and goal setting 3. Patients will explore the value of gratitude and positive focus as related to successful achievement of goals 4. Patients will verbalize a plan for regular reinforcement of personal positive qualities and circumstances.  Summary of Patient Progress:  Anthony Luna presented to group with blunted affect.  He shared that he is still hearing voices.  Anthony Luna was unable to actively participate in today's group discussion regarding setting goals, but shared that his goal for today as well as for the rest of his hospital stay is "work on my mental health to include being around others who are going to encourage me."     Therapeutic Modalities Cognitive Behavioral Therapy Motivational Interviewing    Anthony Luna, [place] 10/08/2017 2:51 PM

## 2017-10-08 NOTE — Plan of Care (Signed)
  Problem: Education: Goal: Knowledge of the prescribed therapeutic regimen will improve Outcome: Progressing   Problem: Coping: Goal: Coping ability will improve Outcome: Progressing   

## 2017-10-08 NOTE — Discharge Instructions (Addendum)
IMPORTANT:*You will need lab work on Wednesday 10/14/17  to monitor Complete Blood Count since starting Clozapine. Last was done on 10/07/17*        Ref Range & Units 2d ago 10/07/17 8d ago 9d ago 2wk ago 58mo ago  WBC 3.8 - 10.6 K/uL 7.1  6.6  8.3  7.3  6.2   RBC 4.40 - 5.90 MIL/uL 5.15  5.18  5.17  5.04  4.92   Hemoglobin 13.0 - 18.0 g/dL 41.316.4  24.416.5  01.016.2  27.216.0  15.6   HCT 40.0 - 52.0 % 47.4  48.1  47.9  46.9  46.1   MCV 80.0 - 100.0 fL 92.1  92.9  92.8  93.2  93.7   MCH 26.0 - 34.0 pg 31.8  31.8  31.4  31.8  31.7   MCHC 32.0 - 36.0 g/dL 53.634.5  64.434.3  03.433.8  74.234.1  33.8   RDW 11.5 - 14.5 % 13.0  13.7  13.9  14.0  13.5   Platelets 150 - 440 K/uL 189  188  197  179 CM 181   Neutrophils Relative % % 55  52  52     Neutro Abs 1.4 - 6.5 K/uL 3.9  3.5  4.3     Lymphocytes Relative % 27  20  19      Lymphs Abs 1.0 - 3.6 K/uL 1.9  1.3  1.6     Monocytes Relative % 13  22  24      Monocytes Absolute 0.2 - 1.0 K/uL 0.9  1.5High   2.0High      Eosinophils Relative % 5  5  4      Eosinophils Absolute 0 - 0.7 K/uL 0.3  0.3  0.4     Basophils Relative % 0  1  1     Basophils Absolute 0 - 0.1 K/uL 0.0  0.0 CM 0.0 CM

## 2017-10-08 NOTE — Progress Notes (Signed)
Recreation Therapy Notes  Date: 10/08/2017  Time: 9:30 am  Location: Craft Room  Behavioral response: Appropriate  Intervention Topic: Goals  Discussion/Intervention:  Group content on today was focused on goals. Patients described what goals are and how they define goals. Individuals expressed how they go about setting goals and reaching them. The group identified how important goals are and if they make short term goals to reach long term goals. Patients described how many goals they work on at a time and what affects them not reaching their goal. Individuals described how much time they put into planning and obtaining their goals. The group participated in the intervention "My Goal Board" and made personal goal boards to help them achieve their goal. Clinical Observations/Feedback:  Patient came to group and stated a goal is something that help you become successful. He was social with peers and staff while completing the intervention during group.   Karina Nofsinger LRT/CTRS         Dashawn Bartnick 10/08/2017 11:11 AM

## 2017-10-08 NOTE — [facility] Group Notes (Signed)
[facility] Group Notes:  (Nursing/MHT/Case Management/Adjunct)  Date:  10/08/2017  Time:  12:12 AM  Type of Therapy:  Group Therapy  Participation Level:  Active  Participation Quality:  Appropriate  Affect:  Appropriate  Cognitive:  Appropriate  Insight:  Appropriate  Engagement in Group:  Engaged  Modes of Intervention:  Discussion  Summary of Progress/Problems:  Anthony Luna 10/08/2017, 12:12 AM 

## 2017-10-08 NOTE — [facility] Group Notes (Signed)
[facility] Group Notes:  (Nursing/MHT/Case Management/Adjunct)  Date:  10/08/2017  Time:  3:09 PM  Type of Therapy:  Psychoeducational Skills  Participation Level:  Active  Participation Quality:  Appropriate and Attentive  Affect:  Appropriate  Cognitive:  Appropriate  Insight:  Appropriate  Engagement in Group:  Engaged and Supportive  Modes of Intervention:  Discussion and Education  Summary of Progress/Problems:  Anthony Luna 10/08/2017, 3:09 PM

## 2017-10-08 NOTE — Progress Notes (Signed)
D ;Continue  to work on Pharmacologistcoping skills , decrease anxiety. Aware discharge and  going to group home  Patient  remains to have altered  thought process.  Information given to patient  very concrete  for better understanding . Instruction given on medication,  cognitively patient has difficulty understanding . Patient stated slept good last night .Stated appetite fair and energy level  Is normal. Stated concentration is good . Stated on Depression scale 0 , hopeless and anxiety .( low 0-10 high) Denies suicidal  homicidal ideations  .  No auditory hallucinations  No pain concerns . Appropriate ADL'S. Interacting with peers and staff.  A: Encourage patient participation with unit programming . Instruction  Given on  Medication , verbalize understanding. R: Voice no other concerns. Staff continue to monitor

## 2017-10-08 NOTE — Plan of Care (Signed)
Continue  to work on Pharmacologistcoping skills , decrease anxiety. Patient  remains to have altered  thought process.  Information given to patient  very concrete  for better understanding . Instruction given on medication,  cognitively patient has difficulty understanding .  Problem: Education: Goal: Utilization of techniques to improve thought processes will improve Outcome: Progressing Goal: Knowledge of the prescribed therapeutic regimen will improve Outcome: Progressing   Problem: Coping: Goal: Coping ability will improve Outcome: Progressing Goal: Will verbalize feelings Outcome: Progressing   Problem: Self-Concept: Goal: Level of anxiety will decrease Outcome: Progressing   Problem: Self-Concept: Goal: Level of anxiety will decrease Outcome: Progressing   Problem: Education: Goal: Knowledge of disease or condition will improve Outcome: Progressing   Problem: Coping: Goal: Will verbalize feelings Outcome: Progressing   Problem: Coping: Goal: Level of anxiety will decrease Outcome: Progressing

## 2017-10-08 NOTE — Progress Notes (Signed)
[facility] MD Progress Note  10/08/2017 10:08 AM Anthony Luna  MRN:  [id number]   Subjective:  Pt states that he is doing better. He still hears some voices but they are much quieter. He states that he heard voices this morning. He denies that they command him to do things such as hurt himself or others. He denies any thoughts of hurting himself or others. He is looking forward to discharge. He wants to spend time with his family his weekend. He states that his grandmother is having a cookout. He has been calm and cooperative on the unit. He interacts well with peers. He has brighter affect and is very polite. He is sleeping much better than on admission. He is tolerating Clozapine well.   Principal Problem: Schizoaffective disorder (HCC) Diagnosis:   Patient Active Problem List   Diagnosis Date Noted  . Schizoaffective disorder (HCC) [F25.9] 09/25/2017  . Cannabis use disorder, severe, dependence (HCC) [F12.20] 11/30/2014  . Schizoaffective disorder, bipolar type (HCC) [F25.0] 11/30/2014  . Tobacco use disorder [F17.200] 11/30/2014   Total Time spent with patient: 20 minutes  Past Psychiatric History: See H&P  Past Medical History:  Past Medical History:  Diagnosis Date  . Depression    Since moving into new apartment 1 year ago  . None to low serum cortisol response with adrenocorticotrophic hormone (ACTH) stimulation test   . Schizophrenia, paranoid type (HCC)    History reviewed. No pertinent surgical history. Family History: History reviewed. No pertinent family history. Family Psychiatric  History: See H&P Social History:  Social History   Substance and Sexual Activity  Alcohol Use Yes  . Alcohol/week: 0.0 oz   Comment: "Special occasions"     Social History   Substance and Sexual Activity  Drug Use Yes  . Frequency: 4.0 times per week  . Types: Marijuana   Comment: "2 blunts"    Social History   Socioeconomic History  . Marital status: Single    Spouse name:  Not on file  . Number of children: Not on file  . Years of education: Not on file  . Highest education level: Not on file  Occupational History  . Not on file  Social Needs  . Financial resource strain: Not on file  . Food insecurity:    Worry: Not on file    Inability: Not on file  . Transportation needs:    Medical: Not on file    Non-medical: Not on file  Tobacco Use  . Smoking status: Current Every Day Smoker    Packs/day: 0.50    Types: Cigarettes    Start date: 11/29/2002  . Smokeless tobacco: Former Neurosurgeon  . Tobacco comment: Patient wishes to quit today.  Substance and Sexual Activity  . Alcohol use: Yes    Alcohol/week: 0.0 oz    Comment: "Special occasions"  . Drug use: Yes    Frequency: 4.0 times per week    Types: Marijuana    Comment: "2 blunts"  . Sexual activity: Not on file  Lifestyle  . Physical activity:    Days per week: Not on file    Minutes per session: Not on file  . Stress: Not on file  Relationships  . Social connections:    Talks on phone: Not on file    Gets together: Not on file    Attends religious service: Not on file    Active member of club or organization: Not on file    Attends meetings of clubs or  organizations: Not on file    Relationship status: Not on file  Other Topics Concern  . Not on file  Social History Narrative  . Not on file   Additional Social History:    Pain Medications: See PTA Prescriptions: See PTA Over the Counter: See PTA History of alcohol / drug use?: Yes Negative Consequences of Use: Financial, Personal relationships, Work / School Name of Substance 1: Alcohol 1 - Amount (size/oz): 2-3 Drinks 1 - Frequency: "Special occasions" 1 - Last Use / Amount: Last night (4/11)                  Sleep: Good  Appetite:  Good  Current Medications: Current Facility-Administered Medications  Medication Dose Route Frequency Provider Last Rate Last Dose  . acetaminophen (TYLENOL) tablet 650 mg  650 mg Oral  Q6H PRN [name], [name], MD      . alum & mag hydroxide-simeth (MAALOX/MYLANTA) 200-200-20 MG/5ML suspension 30 mL  30 mL Oral Q4H PRN [name], [name], MD      . amantadine (SYMMETREL) capsule 100 mg  100 mg Oral BID [name], [name], MD   100 mg at 10/08/17 0848  . cloZAPine (CLOZARIL) tablet 175 mg  175 mg Oral QHS Haskell RilingMcNew, [name], MD   175 mg at 10/07/17 2129  . lithium carbonate (ESKALITH) CR tablet 450 mg  450 mg Oral Q12H [name], [name], MD   450 mg at 10/08/17 0849  . loratadine (CLARITIN) tablet 10 mg  10 mg Oral Daily [name], Ileene HutchinsonHolly R, MD   10 mg at 10/08/17 0848  . LORazepam (ATIVAN) tablet 1 mg  1 mg Oral Q6H PRN Haskell RilingMcNew, [name], MD   1 mg at 10/07/17 2129  . magnesium hydroxide (MILK OF MAGNESIA) suspension 30 mL  30 mL Oral Daily PRN [name], [name], MD      . nicotine (NICODERM CQ - dosed in mg/24 hours) patch 21 mg  21 mg Transdermal Daily [name], Jagannath, MD   21 mg at 10/08/17 0849  . propranolol (INDERAL) tablet 10 mg  10 mg Oral TID [name], [name], MD   10 mg at 10/08/17 0849  . traZODone (DESYREL) tablet 100 mg  100 mg Oral QHS PRN Haskell RilingMcNew, [name], MD   100 mg at 10/07/17 2129    Lab Results:  Results for orders placed or performed during the hospital encounter of 09/25/17 (from the past 48 hour(s))  CBC with Differential/Platelet     Status: None   Collection Time: 10/07/17  6:58 AM  Result Value Ref Range   WBC 7.1 3.8 - 10.6 K/uL   RBC 5.15 4.40 - 5.90 MIL/uL   Hemoglobin 16.4 13.0 - 18.0 g/dL   HCT 11.947.4 14.740.0 - 82.952.0 %   MCV 92.1 80.0 - 100.0 fL   MCH 31.8 26.0 - 34.0 pg   MCHC 34.5 32.0 - 36.0 g/dL   RDW 56.213.0 13.011.5 - 86.514.5 %   Platelets 189 150 - 440 K/uL   Neutrophils Relative % 55 %   Neutro Abs 3.9 1.4 - 6.5 K/uL   Lymphocytes Relative 27 %   Lymphs Abs 1.9 1.0 - 3.6 K/uL   Monocytes Relative 13 %   Monocytes Absolute 0.9 0.2 - 1.0 K/uL   Eosinophils Relative 5 %   Eosinophils Absolute 0.3 0 - 0.7 K/uL   Basophils Relative 0 %   Basophils Absolute 0.0 0 - 0.1  K/uL    Comment: Performed at [facility],  [address] [place], [place] [address]    Blood Alcohol level:  Lab Results  Component Value Date   ETH <10 09/25/2017   ETH <10 04/07/2017    Metabolic Disorder Labs: Lab Results  Component Value Date   HGBA1C 4.7 (L) 09/26/2017   MPG 88.19 09/26/2017   MPG 105.41 04/08/2017   Lab Results  Component Value Date   PROLACTIN 6.1 12/19/2014   PROLACTIN 1.3 (L) 11/30/2014   Lab Results  Component Value Date   CHOL 153 09/26/2017   TRIG 64 09/26/2017   HDL 55 09/26/2017   CHOLHDL 2.8 09/26/2017   VLDL 13 09/26/2017   LDLCALC 85 09/26/2017   LDLCALC 95 04/08/2017    Physical Findings: AIMS: Facial and Oral Movements Muscles of Facial Expression: None, normal Lips and Perioral Area: None, normal Jaw: None, normal Tongue: None, normal,Extremity Movements Upper (arms, wrists, hands, fingers): None, normal Lower (legs, knees, ankles, toes): None, normal, Trunk Movements Neck, shoulders, hips: None, normal, Overall Severity Severity of abnormal movements (highest score from questions above): None, normal Incapacitation due to abnormal movements: None, normal Patient's awareness of abnormal movements (rate only patient's report): No Awareness, Dental Status Current problems with teeth and/or dentures?: No Does patient usually wear dentures?: No  CIWA:  CIWA-Ar Total: 1 COWS:  COWS Total Score: 1  Musculoskeletal: Strength & Muscle Tone: within normal limits Gait & Station: normal Patient leans: N/A  Psychiatric Specialty Exam: Physical Exam  Nursing note and vitals reviewed.   Review of Systems  Cardiovascular: Negative for chest pain and palpitations.  All other systems reviewed and are negative.   Blood pressure 123/72, pulse (!) 113, temperature 98.1 F (36.7 C), temperature source Oral, resp. rate 18, height 6' (1.829 m), weight 85.7 kg (189 lb), SpO2 100 %.Body mass index is 25.63 kg/m.  General  Appearance: Casual  Eye Contact:  Good  Speech:  Clear and Coherent  Volume:  Normal  Mood:  Euthymic  Affect:  Congruent  Thought Process:  Coherent and Goal Directed  Orientation:  Full (Time, Place, and Person)  Thought Content:  Logical  Suicidal Thoughts:  No  Homicidal Thoughts:  No  Memory:  Immediate;   Fair  Judgement:  Fair  Insight:  Fair  Psychomotor Activity:  Normal  Concentration:  Concentration: Fair  Recall:  [employer] of Knowledge:  Fair  Language:  Fair  Akathisia:  No      Assets:  Resilience  ADL's:  Intact  Cognition:  WNL  Sleep:  Number of Hours: 6.15     Treatment Plan Summary: 32 yo male admitted due to psychosis. He is being titrated on Clozapine and tolerating well so far. Voices are still present but much quieter. He no longer appears to be responding to internal stimuli. His affect is brighter overall.   Plan:  Schizoaffective disorder -Continue Clozapine 175 mg qhs. This was increased last night. He will need CBC next Wednesday -Continue Lithium 450 mg BID. Will check level tomorrow morning -He is also on [employer] -He will return to his apartment on discharge  [name], MD 10/08/2017, 10:08 AM

## 2017-10-08 NOTE — BHH Group Notes (Signed)
10/08/2017  Time: 1PM  Type of Therapy/Topic:  Group Therapy:  Balance in Life  Participation Level:  Active  Description of Group:   This group will address the concept of balance and how it feels and looks when one is unbalanced. Patients will be encouraged to process areas in their lives that are out of balance and identify reasons for remaining unbalanced. Facilitators will guide patients in utilizing problem-solving interventions to address and correct the stressor making their life unbalanced. Understanding and applying boundaries will be explored and addressed for obtaining and maintaining a balanced life. Patients will be encouraged to explore ways to assertively make their unbalanced needs known to significant others in their lives, using other group members and facilitator for support and feedback.  Therapeutic Goals: 1. Patient will identify two or more emotions or situations they have that consume much of in their lives. 2. Patient will identify signs/triggers that life has become out of balance:  3. Patient will identify two ways to set boundaries in order to achieve balance in their lives:  4. Patient will demonstrate ability to communicate their needs through discussion and/or role plays  Summary of Patient Progress: Pt continues to work towards their tx goals but has not yet reached them. Pt was able to appropriately participate in group discussion, and was able to offer support/validation to other group members. Pt reported he knows his life is out of balance when he, "smokes a lot and gets angry easily." Pt reported one way he can achieve a better balance in life is to, "walk and stay positive."   Therapeutic Modalities:   Cognitive Behavioral Therapy Solution-Focused Therapy Assertiveness Training  Heidi DachKelsey Tyrez Berrios, MSW, LCSW Clinical Social Worker 10/08/2017 2:06 PM

## 2017-10-09 LAB — CBC WITH DIFFERENTIAL/PLATELET
BASOS ABS: 0 10*3/uL (ref 0–0.1)
BASOS PCT: 1 %
EOS PCT: 4 %
Eosinophils Absolute: 0.4 10*3/uL (ref 0–0.7)
HCT: 46.8 % (ref 40.0–52.0)
Hemoglobin: 16.3 g/dL (ref 13.0–18.0)
LYMPHS PCT: 20 %
Lymphs Abs: 1.9 10*3/uL (ref 1.0–3.6)
MCH: 32.1 pg (ref 26.0–34.0)
MCHC: 34.8 g/dL (ref 32.0–36.0)
MCV: 92.2 fL (ref 80.0–100.0)
Monocytes Absolute: 1.4 10*3/uL — ABNORMAL HIGH (ref 0.2–1.0)
Monocytes Relative: 15 %
Neutro Abs: 6 10*3/uL (ref 1.4–6.5)
Neutrophils Relative %: 60 %
PLATELETS: 231 10*3/uL (ref 150–440)
RBC: 5.07 MIL/uL (ref 4.40–5.90)
RDW: 13.5 % (ref 11.5–14.5)
WBC: 9.8 10*3/uL (ref 3.8–10.6)

## 2017-10-09 LAB — LITHIUM LEVEL: LITHIUM LVL: 0.58 mmol/L — AB (ref 0.60–1.20)

## 2017-10-09 NOTE — Discharge Summary (Signed)
Physician Discharge Summary Note  Patient:  Anthony Luna is an 32 y.o., male MRN:  [id number] DOB:  [birth year] Patient phone:  [phone] (home)  Patient address:   [address] [place] [address],  Total Time spent with patient: 20 minutes  Plus 20 minutes of medication reconciliation, discharge planning, and discharge documentation   Date of Admission:  09/25/2017 Date of Discharge: 10/09/17  Reason for Admission:  Auditory hallucinations  Principal Problem: Schizoaffective disorder [facility]) Discharge Diagnoses: Patient Active Problem List   Diagnosis Date Noted  . Schizoaffective disorder (HCC) [F25.9] 09/25/2017  . Cannabis use disorder, severe, dependence (HCC) [F12.20] 11/30/2014  . Schizoaffective disorder, bipolar type (HCC) [F25.0] 11/30/2014  . Tobacco use disorder [F17.200] 11/30/2014    Past Psychiatric History: See H&P  Past Medical History:  Past Medical History:  Diagnosis Date  . Depression    Since moving into new apartment 1 year ago  . None to low serum cortisol response with adrenocorticotrophic hormone (ACTH) stimulation test   . Schizophrenia, paranoid type (HCC)    History reviewed. No pertinent surgical history. Family History: History reviewed. No pertinent family history. Family Psychiatric  History: See H&P Social History:  Social History   Substance and Sexual Activity  Alcohol Use Yes  . Alcohol/week: 0.0 oz   Comment: "Special occasions"     Social History   Substance and Sexual Activity  Drug Use Yes  . Frequency: 4.0 times per week  . Types: Marijuana   Comment: "2 blunts"    Social History   Socioeconomic History  . Marital status: Single    Spouse name: Not on file  . Number of children: Not on file  . Years of education: Not on file  . Highest education level: Not on file  Occupational History  . Not on file  Social Needs  . Financial resource strain: Not on file  . Food insecurity:    Worry: Not on  file    Inability: Not on file  . Transportation needs:    Medical: Not on file    Non-medical: Not on file  Tobacco Use  . Smoking status: Current Every Day Smoker    Packs/day: 0.50    Types: Cigarettes    Start date: 11/29/2002  . Smokeless tobacco: Former Neurosurgeon  . Tobacco comment: Patient wishes to quit today.  Substance and Sexual Activity  . Alcohol use: Yes    Alcohol/week: 0.0 oz    Comment: "Special occasions"  . Drug use: Yes    Frequency: 4.0 times per week    Types: Marijuana    Comment: "2 blunts"  . Sexual activity: Not on file  Lifestyle  . Physical activity:    Days per week: Not on file    Minutes per session: Not on file  . Stress: Not on file  Relationships  . Social connections:    Talks on phone: Not on file    Gets together: Not on file    Attends religious service: Not on file    Active member of club or organization: Not on file    Attends meetings of clubs or organizations: Not on file    Relationship status: Not on file  Other Topics Concern  . Not on file  Social History Narrative  . Not on file    Hospital Course:  Pt was initially restarted on all home medications including Lithium and Prolixin. He was responding to internal stimuli and very withdrawn initially. He felt  that Prolixin was no longer helpful for him and was still hearing loud voices. He was switched to Clozapine and titrated up to 175 mg qhs. The dosing was initially split through he day but he was very sedated with a daytime dose so was switched to bedtime which helped. He tolerated this very well. CBC was monitored and remained stable. He became much less withdrawn dn was interacting well with peers. He has no episodes of violence or outbursts on the unit. He consistently denied SI or any thoughts of hurting others. On day of discharge, he reported that the voices were much quieter. Denies command hallucinations. He denied SI or any thoughts of self harm. Denied HI. He was looking  forward to spending time with his family his weekend.d He was organized and goal directed in thoughts and speech. He felt ready to discharge home. HE did not display any unsafe behaviors on the unit. He was caring well for ADLs and hygiene was good. He was given 7 day supply of medications from our pharmacy. Lithium level on 5/1 was 0.58 which was below therapeutic level but his mood was stable and no indication to increase dose.    Physical Findings: AIMS: Facial and Oral Movements Muscles of Facial Expression: None, normal Lips and Perioral Area: None, normal Jaw: None, normal Tongue: None, normal,Extremity Movements Upper (arms, wrists, hands, fingers): None, normal Lower (legs, knees, ankles, toes): None, normal, Trunk Movements Neck, shoulders, hips: None, normal, Overall Severity Severity of abnormal movements (highest score from questions above): None, normal Incapacitation due to abnormal movements: None, normal Patient's awareness of abnormal movements (rate only patient's report): No Awareness, Dental Status Current problems with teeth and/or dentures?: No Does patient usually wear dentures?: No  CIWA:  CIWA-Ar Total: 1 COWS:  COWS Total Score: 1  Musculoskeletal: Strength & Muscle Tone: within normal limits Gait & Station: normal Patient leans: N/A  Psychiatric Specialty Exam: Physical Exam  ROS  Blood pressure 119/78, pulse (!) 114, temperature 98.6 F (37 C), temperature source Oral, resp. rate 20, height 6' (1.829 m), weight 85.7 kg (189 lb), SpO2 97 %.Body mass index is 25.63 kg/m.  General Appearance: Casual  Eye Contact:  Fair  Speech:  Clear and Coherent  Volume:  Normal  Mood:  Euthymic  Affect:  Constricted  Thought Process:  Coherent and Goal Directed  Orientation:  Full (Time, Place, and Person)  Thought Content:  Hallucinations: Auditory but improved  Suicidal Thoughts:  No  Homicidal Thoughts:  No  Memory:  Immediate;   Fair  Judgement:  Fair   Insight:  Fair  Psychomotor Activity:  Normal  Concentration:  Concentration: Fair  Recall:  [employer]  Fund of Knowledge:  Fair  Language:  Fair  Akathisia:  No      Assets:  Resilience  ADL's:  Intact  Cognition:  WNL  Sleep:  Number of Hours: 7.75     Have you used any form of tobacco in the last 30 days? (Cigarettes, Smokeless Tobacco, Cigars, and/or Pipes): Yes  Has this patient used any form of tobacco in the last 30 days? (Cigarettes, Smokeless Tobacco, Cigars, and/or Pipes) Yes, Yes, A prescription for an FDA-approved tobacco cessation medication was offered at discharge and the patient refused  Blood Alcohol level:  Lab Results  Component Value Date   [facility] <[facility] 09/25/2017   ETH <10 04/07/2017    Metabolic Disorder Labs:  Lab Results  Component Value Date   HGBA1C 4.7 (L) 09/26/2017   MPG  88.19 09/26/2017   MPG 105.41 04/08/2017   Lab Results  Component Value Date   PROLACTIN 6.1 12/19/2014   PROLACTIN 1.3 (L) 11/30/2014   Lab Results  Component Value Date   CHOL 153 09/26/2017   TRIG 64 09/26/2017   HDL 55 09/26/2017   CHOLHDL 2.8 09/26/2017   VLDL 13 09/26/2017   LDLCALC 85 09/26/2017   LDLCALC 95 04/08/2017    See Psychiatric Specialty Exam and Suicide Risk Assessment completed by Attending Physician prior to discharge.  Discharge destination:  Home  Is patient on multiple antipsychotic therapies at discharge:  Yes,   Do you recommend tapering to monotherapy for antipsychotics?  No   Has Patient had three or more failed trials of antipsychotic monotherapy by history:  Yes, Haldol, Prolixin, INvega  Recommended Plan for Multiple Antipsychotic Therapies: Second antipsychotic is Clozapine.  Reason for adding Clozapine failure with monotherapy  Discharge Instructions    Increase activity slowly   Complete by:  As directed      Allergies as of 10/09/2017   No Known Allergies     Medication List    STOP taking these medications   amoxicillin 500  MG capsule Commonly known as:  AMOXIL   benztropine 1 MG tablet Commonly known as:  COGENTIN   clonazePAM 0.5 MG tablet Commonly known as:  KLONOPIN   fluPHENAZine 10 MG tablet Commonly known as:  PROLIXIN   hydrOXYzine 25 MG tablet Commonly known as:  ATARAX/VISTARIL     TAKE these medications     Indication  amantadine 100 MG capsule Commonly known as:  SYMMETREL Take 1 capsule (100 mg total) by mouth 2 (two) times daily.  Indication:  Extrapyramidal Reaction caused by Medications   clozapine 50 MG tablet Commonly known as:  CLOZARIL Take 3.5 tablets (175 mg total) by mouth at bedtime.  Indication:  Schizophrenia that does Not Respond to Usual Drug Therapy   INVEGA TRINZA 819 MG/2.625ML Susp Generic drug:  Paliperidone Palmitate Inject 819 mg into the muscle.  Indication:  Schizophrenia   lithium carbonate 450 MG CR tablet Commonly known as:  ESKALITH Take 1 tablet (450 mg total) by mouth 2 (two) times daily. What changed:  Another medication with the same name was removed. Continue taking this medication, and follow the directions you see here.  Indication:  Schizoaffective Disorder   propranolol 10 MG tablet Commonly known as:  INDERAL Take 1 tablet (10 mg total) by mouth 3 (three) times daily.  Indication:  Neuroleptic-Induced Akathisia      Follow-up Information    [name] Go on 10/07/2017.   Why:  Your [facility] will pick you up upon discharge from [facility] on Wednesday, 10/07/17 and you will resume services immediately.  Contact information: [address] [place] [address] [phone]           Follow-up recommendations:  Follow up with [facility] Signed: [name], MD 10/09/2017, 9:49 AM

## 2017-10-09 NOTE — Progress Notes (Signed)
  [facility] :  Will you be returning to the same living situation after discharge:  Yes,  Yes At discharge, do you have transportation home?: Yes,  Peer support coming to pick him up at 12pm Do you have the ability to pay for your medications: Yes,  Medicaid  Release of information consent forms completed and in the chart;  Patient's signature needed at discharge.  Patient to Follow up at: Follow-up Information    [name] Go on 10/07/2017.   Why:  [facility] will pick you up upon discharge from [facility] on Wednesday, 10/07/17 and you will resume services immediately.  Contact information: [address] [place] [address] [phone]           Next level of care provider [facility] Link:no  Safety Planning and Suicide Prevention discussed: Yes,  Pt's CST Team Lead, Earleen ReaperBarbara Clapp, at [phone]  Have you used any form of tobacco in the last 30 days? (Cigarettes, Smokeless Tobacco, Cigars, and/or Pipes): Yes  Has patient been referred to the Quitline?: Patient refused referral  Patient has been referred for addiction treatment: Yes  Johny ShearsCassandra  Legacie Dillingham, LCSW 10/09/2017, 8:24 AM

## 2017-10-09 NOTE — Progress Notes (Signed)
Patient is alert and oriented x 4, denies SI/HI/AVH no distress noted, affect is bright, interacting appropriately with peer and staff. Patient was given support and encouraged to attend evening group.Patient attends evening group, compliant with medication.  Patient is scheduled for discharge in the morning he appears exited about it. 15 minutes safety rounds maintained, will continue to closely monitor.

## 2017-10-09 NOTE — BHH Suicide Risk Assessment (Signed)
[facility]   Principal Problem: Schizoaffective disorder [facility](HCC) Discharge Diagnoses:  Patient Active Problem List   Diagnosis Date Noted  . Schizoaffective disorder (HCC) [F25.9] 09/25/2017  . Cannabis use disorder, severe, dependence (HCC) [F12.20] 11/30/2014  . Schizoaffective disorder, bipolar type (HCC) [F25.0] 11/30/2014  . Tobacco use disorder [F17.200] 11/30/2014   Mental Status Per Nursing Assessment::   On Admission:  NA  Demographic Factors:  Male and Living alone  Loss Factors: NA  Historical Factors: Impulsivity  Risk Reduction Factors:   Positive social support, Positive therapeutic relationship and Positive coping skills or problem solving skills  Continued Clinical Symptoms:  Chronic AH  Cognitive Features That Contribute To Risk:  None    Suicide Risk:  Minimal: No identifiable suicidal ideation.   Follow-up Information    [name] Go on 10/07/2017.   Why:  [facility] will pick you up upon discharge from [facility] on Wednesday, 10/07/17 and you will resume services immediately.  Contact information: [address] [place] [address] [phone]           Plan Of Care/Follow-up recommendations: Follow up with [facility]. Your next blood draw is due on 10/14/17  Haskell RilingHolly R [name], MD 10/09/2017, 9:50 AM

## 2017-10-09 NOTE — Progress Notes (Signed)
Recreation Therapy Notes  INPATIENT RECREATION TR PLAN  Patient Details Name: [name] MRN: [id number] DOB: February 12, 1986 Today's Date: 10/09/2017  Rec Therapy Plan Is patient appropriate for Therapeutic Recreation?: Yes Treatment times per week: at least 3  Estimated Length of Stay: 5-7 days TR Treatment/Interventions: Group participation (Comment)  Discharge Criteria Pt will be discharged from therapy if:: Discharged Treatment plan/goals/alternatives discussed and agreed upon by:: Patient/family  Discharge Summary Short term goals set: Patient will successfully identify 2 ways of making healthy decisions post d/c within 5 recreation therapy group sessions Short term goals met: Complete Progress toward goals comments: Groups attended Which groups?: Communication, Leisure education, Stress management, Other (Comment), Goal setting, Self-esteem(Emotions, Problem Solving, Creative Expressions) Reason goals not met: N/A Therapeutic equipment acquired: N/A Reason patient discharged from therapy: Discharge from hospital Pt/family agrees with progress & goals achieved: Yes Date patient discharged from therapy: 10/09/17   [name] 10/09/2017, 1:21 PM

## 2017-10-09 NOTE — Progress Notes (Signed)
Recreation Therapy Notes  Date: 10/09/2017  Time: 9:30 am  Location: Craft Room  Behavioral response: Appropriate  Intervention Topic: Emotions  Discussion/Intervention:  Group content on today was focused on emotions. The group identified what emotions are and why it is important to have emotions. Patients expressed some positive and negative emotions. Individuals gave some past experiences on how they normally dealt with emotions in the past. The group described some positive ways to deal with emotions in the future. Patients participated in the intervention "Name the Megan SalonSong" where individuals were given a chance to experience different emotions.  Clinical Observations/Feedback:  Patient came to group and defined emotions as the way people feel. He stated a common emotion he feels is peace. Individual participated in the intervention and was social with peers and staff during group.  Trinika Cortese LRT/CTRS         Abraham Entwistle 10/09/2017 10:52 AM

## 2017-10-09 NOTE — Progress Notes (Signed)
Received Anthony Luna this AM after breakfast, he was compliant with his medications. He denied all of the psychiatric symptoms and feels safe for discharge home today. The AVS was reviewed and his questions answered. He received his medications and prescriptions. His personal belongings were returned. He was [facility] with 2 staff persons [facility]  [facility].

## 2017-10-09 NOTE — Plan of Care (Signed)
  Problem: Education: Goal: Utilization of techniques to improve thought processes will improve Outcome: Progressing Goal: Knowledge of the prescribed therapeutic regimen will improve Outcome: Progressing   Problem: Coping: Goal: Coping ability will improve Outcome: Progressing Goal: Will verbalize feelings Outcome: Progressing   Problem: Coping: Goal: Will verbalize feelings Outcome: Progressing

## 2017-11-23 DIAGNOSIS — F209 Schizophrenia, unspecified: Secondary | ICD-10-CM | POA: Diagnosis not present

## 2017-11-23 DIAGNOSIS — Z046 Encounter for general psychiatric examination, requested by authority: Secondary | ICD-10-CM | POA: Diagnosis not present

## 2017-11-23 DIAGNOSIS — F250 Schizoaffective disorder, bipolar type: Secondary | ICD-10-CM | POA: Diagnosis not present

## 2017-11-23 DIAGNOSIS — F17210 Nicotine dependence, cigarettes, uncomplicated: Secondary | ICD-10-CM | POA: Diagnosis not present

## 2017-11-23 DIAGNOSIS — Z9114 Patient's other noncompliance with medication regimen: Secondary | ICD-10-CM | POA: Insufficient documentation

## 2017-11-23 DIAGNOSIS — R440 Auditory hallucinations: Secondary | ICD-10-CM | POA: Diagnosis present

## 2017-11-23 DIAGNOSIS — J029 Acute pharyngitis, unspecified: Secondary | ICD-10-CM | POA: Diagnosis not present

## 2017-11-23 DIAGNOSIS — Z79899 Other long term (current) drug therapy: Secondary | ICD-10-CM | POA: Insufficient documentation

## 2017-11-24 ENCOUNTER — Encounter: Payer: Self-pay | Admitting: Emergency Medicine

## 2017-11-24 ENCOUNTER — Emergency Department
Admission: EM | Admit: 2017-11-24 | Discharge: 2017-11-25 | Disposition: A | Discharge status: Other Acute Inpatient Hospital | Payer: Medicaid Other | Attending: Emergency Medicine | Admitting: Emergency Medicine

## 2017-11-24 ENCOUNTER — Other Ambulatory Visit: Payer: Self-pay

## 2017-11-24 DIAGNOSIS — F1220 Cannabis dependence, uncomplicated: Secondary | ICD-10-CM | POA: Diagnosis present

## 2017-11-24 DIAGNOSIS — F209 Schizophrenia, unspecified: Secondary | ICD-10-CM

## 2017-11-24 DIAGNOSIS — F250 Schizoaffective disorder, bipolar type: Secondary | ICD-10-CM | POA: Diagnosis not present

## 2017-11-24 DIAGNOSIS — Z9114 Patient's other noncompliance with medication regimen: Secondary | ICD-10-CM

## 2017-11-24 LAB — CBC WITH DIFFERENTIAL/PLATELET
BASOS ABS: 0 10*3/uL (ref 0–0.1)
BASOS PCT: 0 %
Eosinophils Absolute: 0 10*3/uL (ref 0–0.7)
Eosinophils Relative: 0 %
HEMATOCRIT: 44.9 % (ref 40.0–52.0)
HEMOGLOBIN: 15.3 g/dL (ref 13.0–18.0)
Lymphocytes Relative: 7 %
Lymphs Abs: 1 10*3/uL (ref 1.0–3.6)
MCH: 31.6 pg (ref 26.0–34.0)
MCHC: 34.1 g/dL (ref 32.0–36.0)
MCV: 92.8 fL (ref 80.0–100.0)
Monocytes Absolute: 1.1 10*3/uL — ABNORMAL HIGH (ref 0.2–1.0)
Monocytes Relative: 8 %
NEUTROS ABS: 11.7 10*3/uL — AB (ref 1.4–6.5)
NEUTROS PCT: 85 %
Platelets: 215 10*3/uL (ref 150–440)
RBC: 4.84 MIL/uL (ref 4.40–5.90)
RDW: 13.5 % (ref 11.5–14.5)
WBC: 13.8 10*3/uL — ABNORMAL HIGH (ref 3.8–10.6)

## 2017-11-24 LAB — COMPREHENSIVE METABOLIC PANEL
ALK PHOS: 61 U/L (ref 38–126)
ALT: 48 U/L (ref 17–63)
ANION GAP: 8 (ref 5–15)
AST: 28 U/L (ref 15–41)
Albumin: 4.3 g/dL (ref 3.5–5.0)
BUN: 13 mg/dL (ref 6–20)
CALCIUM: 9.4 mg/dL (ref 8.9–10.3)
CO2: 23 mmol/L (ref 22–32)
Chloride: 105 mmol/L (ref 101–111)
Creatinine, Ser: 1.13 mg/dL (ref 0.61–1.24)
GFR calc non Af Amer: >60 mL/min (ref >60)
Glucose, Bld: 115 mg/dL — ABNORMAL HIGH (ref 65–99)
Potassium: 4.1 mmol/L (ref 3.5–5.1)
SODIUM: 136 mmol/L (ref 135–145)
TOTAL PROTEIN: 7.4 g/dL (ref 6.5–8.1)
Total Bilirubin: 1 mg/dL (ref 0.3–1.2)

## 2017-11-24 LAB — URINE DRUG SCREEN, QUALITATIVE (ARMC ONLY)
Amphetamines, Ur Screen: NOT DETECTED
BARBITURATES, UR SCREEN: NOT DETECTED
BENZODIAZEPINE, UR SCRN: POSITIVE — AB
Cannabinoid 50 Ng, Ur ~~LOC~~: POSITIVE — AB
Cocaine Metabolite,Ur ~~LOC~~: NOT DETECTED
MDMA (Ecstasy)Ur Screen: NOT DETECTED
METHADONE SCREEN, URINE: NOT DETECTED
Opiate, Ur Screen: NOT DETECTED
Phencyclidine (PCP) Ur S: NOT DETECTED
TRICYCLIC, UR SCREEN: NOT DETECTED

## 2017-11-24 LAB — SALICYLATE LEVEL

## 2017-11-24 LAB — ACETAMINOPHEN LEVEL

## 2017-11-24 LAB — ETHANOL

## 2017-11-24 MED ORDER — AMANTADINE HCL 100 MG PO CAPS
100.0000 mg | ORAL_CAPSULE | Freq: Two times a day (BID) | ORAL | Status: DC
  Administered 2017-11-24: 100 mg via ORAL
  Filled 2017-11-24 (×2): qty 1

## 2017-11-24 MED ORDER — LITHIUM CARBONATE ER 450 MG PO TBCR
450.0000 mg | EXTENDED_RELEASE_TABLET | Freq: Two times a day (BID) | ORAL | Status: DC
  Administered 2017-11-24: 450 mg via ORAL
  Filled 2017-11-24: qty 1

## 2017-11-24 MED ORDER — PROPRANOLOL HCL 10 MG PO TABS
10.0000 mg | ORAL_TABLET | Freq: Three times a day (TID) | ORAL | Status: DC
  Administered 2017-11-24 (×2): 10 mg via ORAL
  Filled 2017-11-24 (×2): qty 1

## 2017-11-24 MED ORDER — CLOZAPINE 100 MG PO TABS
200.0000 mg | ORAL_TABLET | Freq: Every day | ORAL | Status: DC
  Administered 2017-11-24: 200 mg via ORAL
  Filled 2017-11-24: qty 2

## 2017-11-24 MED ORDER — CLOZAPINE 100 MG PO TABS: 350.0000 mg | ORAL_TABLET | Freq: Every day | ORAL | Status: DC

## 2017-11-24 MED ORDER — NICOTINE 21 MG/24HR TD PT24
21.0000 mg | MEDICATED_PATCH | Freq: Every day | TRANSDERMAL | Status: DC
  Administered 2017-11-24: 21 mg via TRANSDERMAL
  Filled 2017-11-24: qty 1

## 2017-11-24 NOTE — ED Notes (Signed)
Pt. Alert and oriented, warm and dry, in no distress. Pt. Denies SI, HI, and VH. Pt states hearing AH. Pt states the voices are telling him he is breathing and that he has a heart rate.  Pt states he did not take medications yesterday because he had dental work and they instructed him not to eat or drink before procedure. Pt. Encouraged to let nursing staff know of any concerns or needs.

## 2017-11-24 NOTE — ED Provider Notes (Signed)
[facility] Emergency Department Provider Note   ____________________________________________   First MD Initiated Contact with Patient 11/24/17 0113     (approximate)  I have reviewed the triage vital signs and the nursing notes.   HISTORY  Chief Complaint Withdrawal and Psychiatric Evaluation    HPI Anthony Luna is a 32 y.o. male who comes into the hospital today thinking he may have had a panic attack.  The patient reports that he had his wisdom teeth pulled today.  He went to sleep around 8:00 and states that he woke up hearing noises in his house.  He thought someone was walking around.  The patient states then that he heard a baby crying behind his apartment and his heart started beating fast.  The patient did not take his medications today because of his procedure.  The patient has some mild sore throat and reports that he is also been seeing the veins in his head.  He denies any suicidal or homicidal ideation.  The patient is here today for evaluation.   Past Medical History:  Diagnosis Date  . Depression    Since moving into new apartment 1 year ago  . None to low serum cortisol response with adrenocorticotrophic hormone (ACTH) stimulation test   . Schizophrenia, paranoid type [facility](HCC)     Patient Active Problem List   Diagnosis Date Noted  . Schizoaffective disorder (HCC) 09/25/2017  . Cannabis use disorder, severe, dependence (HCC) 11/30/2014  . Schizoaffective disorder, bipolar type (HCC) 11/30/2014  . Tobacco use disorder 11/30/2014    History reviewed. No pertinent surgical history.  Prior to Admission medications   Medication Sig Start Date End Date Taking? Authorizing Provider  amantadine (SYMMETREL) 100 MG capsule Take 1 capsule (100 mg total) by mouth 2 (two) times daily. 10/08/17   [name], Ileene HutchinsonHolly R, MD  clozapine (CLOZARIL) 50 MG tablet Take 3.5 tablets (175 mg total) by mouth at bedtime. 10/08/17 11/07/17  [name], Ileene HutchinsonHolly R, MD    lithium carbonate (ESKALITH) 450 MG CR tablet Take 1 tablet (450 mg total) by mouth 2 (two) times daily. 10/08/17   [name], Ileene HutchinsonHolly R, MD  Paliperidone Palmitate (INVEGA TRINZA) 819 MG/2.625ML SUSP Inject 819 mg into the muscle.    [provider]  propranolol (INDERAL) 10 MG tablet Take 1 tablet (10 mg total) by mouth 3 (three) times daily. 10/08/17   [name], Ileene HutchinsonHolly R, MD    Allergies Patient has no known allergies.  No family history on file.  Social History Social History   Tobacco Use  . Smoking status: Current Every Day Smoker    Packs/day: 0.50    Types: Cigarettes    Start date: 11/29/2002  . Smokeless tobacco: Former NeurosurgeonUser  . Tobacco comment: Patient wishes to quit today.  Substance Use Topics  . Alcohol use: Yes    Alcohol/week: 0.0 oz    Comment: "Special occasions"  . Drug use: Yes    Frequency: 4.0 times per week    Types: Marijuana    Comment: "2 blunts"    Review of Systems  Constitutional: No fever/chills Eyes: No visual changes. ENT: No sore throat. Cardiovascular: Denies chest pain. Respiratory: Denies shortness of breath. Gastrointestinal: No abdominal pain.  No nausea, no vomiting.  No diarrhea.  No constipation. Genitourinary: Negative for dysuria. Musculoskeletal: Negative for back pain. Skin: Negative for rash. Neurological: Negative for headaches, focal weakness or numbness. Psych: Auditory hallucinations  ____________________________________________   PHYSICAL EXAM:  VITAL SIGNS: ED Triage Vitals  Enc Vitals Group     BP 11/24/17 0007 127/73     Pulse Rate 11/24/17 0007 78     Resp 11/24/17 0007 18     Temp 11/24/17 0007 97.7 F (36.5 C)     Temp Source 11/24/17 0007 Oral     SpO2 11/24/17 0007 97 %     Weight 11/24/17 0008 188 lb (85.3 kg)     Height 11/24/17 0008 6' (1.829 m)     Head Circumference --      Peak Flow --      Pain Score 11/24/17 0008 2     Pain Loc --      Pain Edu? --      Excl. in GC? --      Constitutional: Alert and oriented. Well appearing and in mild distress. Eyes: Conjunctivae are normal. PERRL. EOMI. Head: Atraumatic. Nose: No congestion/rhinnorhea. Mouth/Throat: Mucous membranes are moist.  Oropharynx non-erythematous. Cardiovascular: Normal rate, regular rhythm. Grossly normal heart sounds.  Good peripheral circulation. Respiratory: Normal respiratory effort.  No retractions. Lungs CTAB. Gastrointestinal: Soft and nontender. No distention.  Positive bowel sounds Musculoskeletal: No lower extremity tenderness nor edema.   Neurologic:  Normal speech and language.  Skin:  Skin is warm, dry and intact.  Psychiatric: Mood and affect are normal.   ____________________________________________   LABS (all labs ordered are listed, but only abnormal results are displayed)  Labs Reviewed  COMPREHENSIVE METABOLIC PANEL - Abnormal; Notable for the following components:      Result Value   Glucose, Bld 115 (*)    All other components within normal limits  URINE DRUG SCREEN, QUALITATIVE ([facility] ONLY) - Abnormal; Notable for the following components:   Cannabinoid 50 Ng, Ur [place] POSITIVE (*)    Benzodiazepine, Ur Scrn POSITIVE (*)    All other components within normal limits  CBC WITH DIFFERENTIAL/PLATELET - Abnormal; Notable for the following components:   WBC 13.8 (*)    Neutro Abs 11.7 (*)    Monocytes Absolute 1.1 (*)    All other components within normal limits  ACETAMINOPHEN LEVEL - Abnormal; Notable for the following components:   Acetaminophen (Tylenol), Serum <10 (*)    All other components within normal limits  ETHANOL  SALICYLATE LEVEL   ____________________________________________  EKG  none ____________________________________________  RADIOLOGY  ED MD interpretation:  none  Official radiology report(s): No results found.  ____________________________________________   PROCEDURES  Procedure(s) performed: None  Procedures  Critical Care  performed: No  ____________________________________________   INITIAL IMPRESSION / ASSESSMENT AND PLAN / ED COURSE  As part of my medical decision making, I reviewed the following data within the electronic MEDICAL RECORD NUMBER Notes from prior ED visits and [place] Controlled Substance Database   This is a 32 year old male who comes into the hospital today thinking that he may have had a panic attack.  The patient has schizophrenia and reports that he has not taken his medications today after having his wisdom teeth pulled.  We did check some blood work on the patient which was unremarkable.  We will have TTS evaluate the patient as well as tele-psychiatry.      ____________________________________________   FINAL CLINICAL IMPRESSION(S) / ED DIAGNOSES  Final diagnoses:  Schizophrenia, unspecified type (HCC)  Non compliance w medication regimen     ED Discharge Orders    None       Note:  This document was prepared using Dragon voice recognition software and may include unintentional dictation errors.  Rebecka Apley, MD 11/24/17 [phone]

## 2017-11-24 NOTE — ED Notes (Signed)
Hourly rounding reveals patient sleeping in room. No complaints, stable, in no acute distress. Q15 minute rounds and monitoring via Security Cameras to continue. 

## 2017-11-24 NOTE — BH Assessment (Signed)
Assessment Note  Anthony Luna is an 32 y.o. male who presents to the ER due to increase panic attacks and paranoia. He reports of having racing thoughts and fear that someone is going to hurt him or die.  Patient is well known in the ER due to similar presentation. Patient reports of having AV/H.  During the interview, the patient was calm, cooperative and pleasant. He was able to provide appropriate answers to the questions.  Diagnosis: Schizophrenia  Past Medical History:  Past Medical History:  Diagnosis Date  . Depression    Since moving into new apartment 1 year ago  . None to low serum cortisol response with adrenocorticotrophic hormone (ACTH) stimulation test   . Schizophrenia, paranoid type (HCC)     History reviewed. No pertinent surgical history.  Family History: No family history on file.  Social History:  reports that he has been smoking cigarettes.  He started smoking about 14 years ago. He has been smoking about 0.50 packs per day. He has quit using smokeless tobacco. He reports that he drinks alcohol. He reports that he has current or past drug history. Drug: Marijuana. Frequency: 4.00 times per week.  Additional Social History:  Alcohol / Drug Use Pain Medications: See PTA Prescriptions: See PTA Over the Counter: See PTA History of alcohol / drug use?: Yes Longest period of sobriety (when/how long): unknown Negative Consequences of Use: Financial, Personal relationships, Work / School Substance #1 Name of Substance 1: marijuana 1 - Age of First Use: 17 1 - Amount (size/oz): varies 1 - Frequency: infrequent 1 - Duration: Unable to quantify 1 - Last Use / Amount: Unknown  CIWA: CIWA-Ar BP: 121/71 Pulse Rate: 79 COWS:    Allergies: No Known Allergies  Home Medications:  (Not in a hospital admission)  OB/GYN Status:  No LMP for male patient.  General Assessment Data Location of Assessment: [facility] ED TTS Assessment: In system Is this a Tele or  Face-to-Face Assessment?: Face-to-Face Is this an Initial Assessment or a Re-assessment for this encounter?: Initial Assessment Marital status: Single Is patient pregnant?: No Pregnancy Status: No Living Arrangements: Alone Can pt return to current living arrangement?: Yes Admission Status: Voluntary Is patient capable of signing voluntary admission?: Yes Referral Source: Self/Family/Friend Insurance type: Medicaid  Medical Screening Exam [facility] Walk-in ONLY) Medical Exam completed: Yes  Crisis Care Plan Living Arrangements: Alone Legal Guardian: Other:(Self) Name of Psychiatrist: [facility] Name of Therapist: [facility]  Education Status Is patient currently in school?: No Is the patient employed, unemployed or receiving disability?: Receiving disability income, Unemployed  Risk to self with the past 6 months Suicidal Ideation: No Has patient been a risk to self within the past 6 months prior to admission? : No Suicidal Intent: No Has patient had any suicidal intent within the past 6 months prior to admission? : No Is patient at risk for suicide?: No Suicidal Plan?: No Has patient had any suicidal plan within the past 6 months prior to admission? : No Access to Means: No Specify Access to Suicidal Means: Pt. reports of none What has been your use of drugs/alcohol within the last 12 months?: Cannabis Previous Attempts/Gestures: No How many times?: 0 Other Self Harm Risks: Reports of none Triggers for Past Attempts: Hallucinations Intentional Self Injurious Behavior: None Family Suicide History: No Recent stressful life event(s): Other (Comment) Persecutory voices/beliefs?: Yes Depression: Yes Depression Symptoms: Insomnia, Tearfulness, Isolating, Fatigue, Guilt, Feeling worthless/self pity, Loss of interest in usual pleasures Substance abuse history  and/or treatment for substance abuse?: No Suicide prevention information given to non-admitted patients: Not  applicable  Risk to Others within the past 6 months Homicidal Ideation: No Does patient have any lifetime risk of violence toward others beyond the six months prior to admission? : No Thoughts of Harm to Others: No Current Homicidal Intent: No Current Homicidal Plan: No Access to Homicidal Means: No Identified Victim: n/a History of harm to others?: No Assessment of Violence: None Noted Violent Behavior Description: Reports of none Does patient have access to weapons?: No Criminal Charges Pending?: No Does patient have a court date: No Is patient on probation?: No  Psychosis Hallucinations: Auditory, Visual Delusions: None noted  Mental Status Report Appearance/Hygiene: Unremarkable, In scrubs Eye Contact: Fair Motor Activity: Freedom of movement, Unremarkable Speech: Logical/coherent, Unremarkable Level of Consciousness: Alert Mood: Depressed, Anxious, Sad, Pleasant Affect: Depressed, Sad Anxiety Level: Minimal Thought Processes: Relevant, Coherent, Irrelevant Judgement: Partial Orientation: Person, Place, Time, Situation, Appropriate for developmental age Obsessive Compulsive Thoughts/Behaviors: Minimal  Cognitive Functioning Concentration: Normal Memory: Recent Intact, Remote Intact Is patient IDD: No Is patient DD?: No Insight: Fair Impulse Control: Fair Appetite: Fair Have you had any weight changes? : No Change Sleep: No Change Total Hours of Sleep: 8 Vegetative Symptoms: None  ADLScreening [facility] Assessment Services) Patient's cognitive ability adequate to safely complete daily activities?: Yes Patient able to express need for assistance with ADLs?: Yes Independently performs ADLs?: Yes (appropriate for developmental age)  Prior Inpatient Therapy Prior Inpatient Therapy: Yes Prior Therapy Dates: 2018, 2016 [facility] Reason for Treatment: depression, schizophrenia  Prior Outpatient Therapy Prior Outpatient Therapy:  Yes Prior Therapy Dates: 2018-2019 Prior Therapy Facilty/Provider(s): [facility] Reason for Treatment: Depression, schizophrenia Does patient have an ACCT team?: No Does patient have Intensive In-House Services?  : No Does patient have [facility] services? : No Does patient have P4CC services?: No  ADL Screening (condition at time of admission) Patient's cognitive ability adequate to safely complete daily activities?: Yes Is the patient deaf or have difficulty hearing?: No Does the patient have difficulty seeing, even when wearing glasses/contacts?: No Does the patient have difficulty concentrating, remembering, or making decisions?: No Patient able to express need for assistance with ADLs?: Yes Does the patient have difficulty dressing or bathing?: No Independently performs ADLs?: Yes (appropriate for developmental age) Does the patient have difficulty walking or climbing stairs?: No Weakness of Legs: None Weakness of Arms/Hands: None  Home Assistive Devices/Equipment Home Assistive Devices/Equipment: None  Therapy Consults (therapy consults require a physician order) PT Evaluation Needed: No OT Evalulation Needed: No SLP Evaluation Needed: No Abuse/Neglect Assessment (Assessment to be complete while patient is alone) Abuse/Neglect Assessment Can Be Completed: Yes Physical Abuse: Denies Verbal Abuse: Denies Sexual Abuse: Denies Exploitation of patient/patient's resources: Denies Self-Neglect: Denies Values / Beliefs Cultural Requests During Hospitalization: None Spiritual Requests During Hospitalization: None Consults Spiritual Care Consult Needed: No Social Work Consult Needed: No         Child/Adolescent Assessment Running Away Risk: Denies(Patient is an adult)  Disposition:  Disposition Initial Assessment Completed for this Encounter: Yes  On Site Evaluation by:   Reviewed with Physician:    Lilyan Gilfordalvin J. Kellene Mccleary MS, LCAS, LPC, NCC, CCSI Therapeutic Triage  Specialist 11/24/2017 7:07 PM

## 2017-11-24 NOTE — ED Notes (Signed)
PT VOL/ PENDING  GOING  TO  [facility] TONIGHT 

## 2017-11-24 NOTE — Consult Note (Signed)
[place] [facility] Consult   Reason for Consult: Consult for 32 year old man with a history of chronic mental illness who comes voluntarily claiming worsening psychosis Referring Physician: Archie Balboa Patient Identification: Anthony Luna MRN:  [id number] Principal Diagnosis: Schizoaffective disorder, bipolar type [facility]) Diagnosis:   Patient Active Problem List   Diagnosis Date Noted  . Schizoaffective disorder ([place]) [F25.9] 09/25/2017  . Cannabis use disorder, severe, dependence ([place]) [F12.20] 11/30/2014  . Schizoaffective disorder, bipolar type ([place]) [F25.0] 11/30/2014  . Tobacco use disorder [F17.200] 11/30/2014    Total Time spent with patient: 1 hour  Subjective:   Anthony Luna is a 32 y.o. male patient admitted with "I need some help.  The voices are bad.".  HPI: Patient seen.  Chart reviewed.  Patient known from previous encounters.  This is a 32 year old man with a history of psychotic disorder.  He comes voluntarily to the emergency room saying that he is having auditory hallucinations that are happening frequently sometimes seeming like they go on all day.  He says he knows they are not real but they are very upsetting to him.  He describes them as saying things that do not make sense.  He says they will frequently say things like "look left".  He is having nightmares frequently.  He feels so distressed by this he is having suicidal thoughts and thought about pounding his head on the floor.  No homicidal ideation.  Claims that he is taking his medication reliably.  Admits that he smokes marijuana still pretty frequently sometimes almost every day.  Denies other drug abuse.  Patient is specifically requesting that he moved back into a group home saying that he does not think that it is working to be living by himself.  Social history: Patient does not have much family that he stays in contact with.  Very little social life.  He says the act team has been trying to  get him involved in vocational rehab but otherwise he spends most of his time sitting around his apartment smoking weed listening to music and feeling lonely.  Medical history: He has a tremor which she shows me in his left hand.  He tells me that he has been told he had Parkinson's disease but it does not look much of anything like Parkinson's disease to me.  Could possibly be tardive dyskinesia but I do not see at present anywhere else in his body.  Substance abuse history: Long-standing struggle with cannabis abuse which clearly worsens his symptoms and presentation.  Past Psychiatric History: Patient has a history of psychotic disorder carries a diagnosis of schizoaffective disorder.  Does have a history of self injury.  No history of violence.  Has been on multiple medications.  Clozapine at least in the hospital situations seem to be helping.  Last time he was in the hospital was about 2 months ago.  He does have an act team.  Risk to Self:   Risk to Others:   Prior Inpatient Therapy:   Prior Outpatient Therapy:    Past Medical History:  Past Medical History:  Diagnosis Date  . Depression    Since moving into new apartment 1 year ago  . None to low serum cortisol response with adrenocorticotrophic hormone (ACTH) stimulation test   . Schizophrenia, paranoid type ([place])    History reviewed. No pertinent surgical history. Family History: No family history on file. Family Psychiatric  History: Does not know of any Social History:  Social History   Substance  and Sexual Activity  Alcohol Use Yes  . Alcohol/week: 0.0 oz   Comment: "Special occasions"     Social History   Substance and Sexual Activity  Drug Use Yes  . Frequency: 4.0 times per week  . Types: Marijuana   Comment: "2 blunts"    Social History   Socioeconomic History  . Marital status: Single    Spouse name: Not on file  . Number of children: Not on file  . Years of education: Not on file  . Highest education  level: Not on file  Occupational History  . Not on file  Social Needs  . Financial resource strain: Not on file  . Food insecurity:    Worry: Not on file    Inability: Not on file  . Transportation needs:    Medical: Not on file    Non-medical: Not on file  Tobacco Use  . Smoking status: Current Every Day Smoker    Packs/day: 0.50    Types: Cigarettes    Start date: 11/29/2002  . Smokeless tobacco: Former Systems developer  . Tobacco comment: Patient wishes to quit today.  Substance and Sexual Activity  . Alcohol use: Yes    Alcohol/week: 0.0 oz    Comment: "Special occasions"  . Drug use: Yes    Frequency: 4.0 times per week    Types: Marijuana    Comment: "2 blunts"  . Sexual activity: Not on file  Lifestyle  . Physical activity:    Days per week: Not on file    Minutes per session: Not on file  . Stress: Not on file  Relationships  . Social connections:    Talks on phone: Not on file    Gets together: Not on file    Attends religious service: Not on file    Active member of club or organization: Not on file    Attends meetings of clubs or organizations: Not on file    Relationship status: Not on file  Other Topics Concern  . Not on file  Social History Narrative  . Not on file   Additional Social History:    Allergies:  No Known Allergies  Labs:  Results for orders placed or performed during the hospital encounter of 11/24/17 (from the past 48 hour(s))  Comprehensive metabolic panel     Status: Abnormal   Collection Time: 11/24/17 12:13 AM  Result Value Ref Range   Sodium 136 135 - 145 mmol/L   Potassium 4.1 3.5 - 5.1 mmol/L   Chloride 105 101 - 111 mmol/L   CO2 23 22 - 32 mmol/L   Glucose, Bld 115 (H) 65 - 99 mg/dL   BUN 13 6 - 20 mg/dL   Creatinine, Ser 1.13 0.61 - 1.24 mg/dL   Calcium 9.4 8.9 - 10.3 mg/dL   Total Protein 7.4 6.5 - 8.1 g/dL   Albumin 4.3 3.5 - 5.0 g/dL   AST 28 15 - 41 U/L   ALT 48 17 - 63 U/L   Alkaline Phosphatase 61 38 - 126 U/L   Total  Bilirubin 1.0 0.3 - 1.2 mg/dL   GFR calc non Af Amer >60 >60 mL/min   GFR calc Af Amer >60 >60 mL/min    Comment: (NOTE) The eGFR has been calculated using the CKD EPI equation. This calculation has not been validated in all clinical situations. eGFR's persistently <60 mL/min signify possible Chronic Kidney Disease.    Anion gap 8 5 - 15    Comment: Performed at  [facility], [address], [place], [place] [address]  Ethanol     Status: None   Collection Time: 11/24/17 12:13 AM  Result Value Ref Range   Alcohol, Ethyl (B) <10 <10 mg/dL    Comment: (NOTE) Lowest detectable limit for serum alcohol is 10 mg/dL. For medical purposes only. Performed at [facility], [address] [place], [place] [address]   Urine Drug Screen, Qualitative     Status: Abnormal   Collection Time: 11/24/17 12:13 AM  Result Value Ref Range   Tricyclic, Ur Screen NONE DETECTED NONE DETECTED   Amphetamines, Ur Screen NONE DETECTED NONE DETECTED   MDMA (Ecstasy)Ur Screen NONE DETECTED NONE DETECTED   Cocaine Metabolite,Ur [place] NONE DETECTED NONE DETECTED   Opiate, Ur Screen NONE DETECTED NONE DETECTED   Phencyclidine (PCP) Ur S NONE DETECTED NONE DETECTED   Cannabinoid 50 Ng, Ur [place] POSITIVE (A) NONE DETECTED   Barbiturates, Ur Screen NONE DETECTED NONE DETECTED   Benzodiazepine, Ur Scrn POSITIVE (A) NONE DETECTED   Methadone Scn, Ur NONE DETECTED NONE DETECTED    Comment: (NOTE) Tricyclics + metabolites, urine    Cutoff 1000 ng/mL Amphetamines + metabolites, urine  Cutoff 1000 ng/mL MDMA (Ecstasy), urine              Cutoff 500 ng/mL Cocaine Metabolite, urine          Cutoff 300 ng/mL Opiate + metabolites, urine        Cutoff 300 ng/mL Phencyclidine (PCP), urine         Cutoff 25 ng/mL Cannabinoid, urine                 Cutoff 50 ng/mL Barbiturates + metabolites, urine  Cutoff 200 ng/mL Benzodiazepine, urine              Cutoff 200 ng/mL Methadone, urine                   Cutoff  300 ng/mL The urine drug screen provides only a preliminary, unconfirmed analytical test result and should not be used for non-medical purposes. Clinical consideration and professional judgment should be applied to any positive drug screen result due to possible interfering substances. A more specific alternate chemical method must be used in order to obtain a confirmed analytical result. Gas chromatography / mass spectrometry (GC/MS) is the preferred confirmat ory method. Performed at [facility], [address] [place], [place] [address]   CBC with Diff     Status: Abnormal   Collection Time: 11/24/17 12:13 AM  Result Value Ref Range   WBC 13.8 (H) 3.8 - 10.6 K/uL   RBC 4.84 4.40 - 5.90 MIL/uL   Hemoglobin 15.3 13.0 - 18.0 g/dL   HCT 44.9 40.0 - 52.0 %   MCV 92.8 80.0 - 100.0 fL   MCH 31.6 26.0 - 34.0 pg   MCHC 34.1 32.0 - 36.0 g/dL   RDW 13.5 11.5 - 14.5 %   Platelets 215 150 - 440 K/uL   Neutrophils Relative % 85 %   Neutro Abs 11.7 (H) 1.4 - 6.5 K/uL   Lymphocytes Relative 7 %   Lymphs Abs 1.0 1.0 - 3.6 K/uL   Monocytes Relative 8 %   Monocytes Absolute 1.1 (H) 0.2 - 1.0 K/uL   Eosinophils Relative 0 %   Eosinophils Absolute 0.0 0 - 0.7 K/uL   Basophils Relative 0 %   Basophils Absolute 0.0 0 - 0.1 K/uL    Comment: Performed  at [facility], [address] [place], [place] [address]  Salicylate level     Status: None   Collection Time: 11/24/17 12:13 AM  Result Value Ref Range   Salicylate Lvl <4.4 2.8 - 30.0 mg/dL    Comment: Performed at [facility], [address] [place], [place] [address]  Acetaminophen level     Status: Abnormal   Collection Time: 11/24/17  1:14 AM  Result Value Ref Range   Acetaminophen (Tylenol), Serum <10 (L) 10 - 30 ug/mL    Comment: (NOTE) Therapeutic concentrations vary significantly. A range of 10-30 ug/mL  may be an effective concentration for many patients. However, some  are best treated at  concentrations outside of this range. Acetaminophen concentrations >150 ug/mL at 4 hours after ingestion  and >50 ug/mL at 12 hours after ingestion are often associated with  toxic reactions. Performed at [facility], [address], [place], [place] [address]     Current Facility-Administered Medications  Medication Dose Route Frequency Provider Last Rate Last Dose  . amantadine (SYMMETREL) capsule 100 mg  100 mg Oral BID ,  T, MD      . cloZAPine (CLOZARIL) tablet 200 mg  200 mg Oral QHS ,  T, MD      . lithium carbonate (ESKALITH) CR tablet 450 mg  450 mg Oral Q12H ,  T, MD      . nicotine (NICODERM CQ - dosed in mg/24 hours) patch 21 mg  21 mg Transdermal Daily ,  T, MD      . propranolol (INDERAL) tablet 10 mg  10 mg Oral TID , [name], MD       Current Outpatient Medications  Medication Sig Dispense Refill  . amantadine (SYMMETREL) 100 MG capsule Take 1 capsule (100 mg total) by mouth 2 (two) times daily. 60 capsule 0  . clozapine (CLOZARIL) 50 MG tablet Take 3.5 tablets (175 mg total) by mouth at bedtime. 105 tablet 0  . lithium carbonate (ESKALITH) 450 MG CR tablet Take 1 tablet (450 mg total) by mouth 2 (two) times daily. 60 tablet 0  . Paliperidone Palmitate (INVEGA TRINZA) 819 MG/2.625ML SUSP Inject 819 mg into the muscle.    . propranolol (INDERAL) 10 MG tablet Take 1 tablet (10 mg total) by mouth 3 (three) times daily. 90 tablet 0    Musculoskeletal: Strength & Muscle Tone: within normal limits Gait & Station: normal Patient leans: N/A  Psychiatric Specialty Exam: Physical Exam  Nursing note and vitals reviewed. Constitutional: He appears well-developed and well-nourished.  HENT:  Head: Normocephalic and atraumatic.  Eyes: Pupils are equal, round, and reactive to light. Conjunctivae are normal.  Neck: Normal range of motion.  Cardiovascular: Regular rhythm and normal heart sounds.  Respiratory: Effort  normal. No respiratory distress.  GI: Soft.  Musculoskeletal: Normal range of motion.  Neurological: He is alert. He displays tremor.  Patient shows me a tremor in his left thumb.  He says that it happens without any intention on his part and claims it has been diagnosed as Parkinson's disease.  It was hard for me to tell but it looked like at least part of it was intentional and I did not see a tremor anywhere else on him.  Skin: Skin is warm and dry.  Psychiatric: Judgment normal. His mood appears anxious. His speech is delayed. He is slowed, withdrawn and actively hallucinating. Thought content is paranoid. Cognition and memory are impaired. He exhibits a depressed mood. He  expresses suicidal ideation. He expresses no homicidal ideation.    Review of Systems  Constitutional: Negative.   HENT: Negative.   Eyes: Negative.   Respiratory: Negative.   Cardiovascular: Negative.   Gastrointestinal: Negative.   Musculoskeletal: Negative.   Skin: Negative.   Neurological: Positive for tremors.  Psychiatric/Behavioral: Positive for depression, hallucinations, substance abuse and suicidal ideas. Negative for memory loss. The patient is nervous/anxious and has insomnia.     Blood pressure 127/73, pulse 78, temperature 97.7 F (36.5 C), temperature source Oral, resp. rate 18, height 6' (1.829 m), weight 85.3 kg (188 lb), SpO2 97 %.Body mass index is 25.5 kg/m.  General Appearance: Fairly Groomed  Eye Contact:  Good  Speech:  Slow  Volume:  Decreased  Mood:  Dysphoric  Affect:  Constricted  Thought Process:  Goal Directed  Orientation:  Full (Time, Place, and Person)  Thought Content:  Logical, Delusions and Hallucinations: Auditory  Suicidal Thoughts:  Yes.  with intent/plan  Homicidal Thoughts:  No  Memory:  Immediate;   Fair Recent;   Fair Remote;   Fair  Judgement:  Fair  Insight:  Fair  Psychomotor Activity:  Decreased  Concentration:  Concentration: Fair  Recall:  [employer] of  Knowledge:  Fair  Language:  Fair  Akathisia:  No  Handed:  Right  AIMS (if indicated):     Assets:  Desire for Improvement Housing Physical Health Resilience Social Support  ADL's:  Intact  Cognition:  WNL  Sleep:        Treatment Plan Summary: Daily contact with patient to assess and evaluate symptoms and progress in treatment, Medication management and Plan This is a 32 year old man with schizoaffective disorder who is complaining of a return of psychotic symptoms.  He is a little confused and troubled by his hallucinations and cannot give me a clear timeline but there is certainly getting worse.  Patient says he needs "help" and thinks he needs more structure in his life as well as having his medications stabilized.  He is voluntarily agreeable to admission to the hospital.  Orders placed to get him his nicotine patch and continue the clozapine and lithium and other medicines he was taking at home.  Case reviewed with ER physician and TTS.  Orders will be completed for admission downstairs.  Disposition: Recommend psychiatric Inpatient admission when medically cleared. Supportive therapy provided about ongoing stressors. Discussed crisis plan, support from social network, calling 911, coming to the Emergency Department, and calling Suicide Hotline.  Alethia Berthold, MD 11/24/2017 4:18 PM

## 2017-11-24 NOTE — ED Notes (Addendum)
Report to include situation, background, assessment and recommendations from [facility]. Patient alert and oriented, in no distress. Q15 minute rounds and security camera observation to continue. Pt. Denies AVH and pain.

## 2017-11-24 NOTE — Progress Notes (Signed)
Confirmed clozapine eligibility in rems program. ANC [phone]. Will continue to monitor ANC weekly per protocol.  Delta Pichon A. Glenmontookson, VermontPharm.D., BCPS Clinical Pharmacist 11/24/17 16:02

## 2017-11-24 NOTE — ED Notes (Signed)
Hourly rounding reveals patient in room. No complaints, stable, in no acute distress. Q15 minute rounds and monitoring via Security Cameras to continue. 

## 2017-11-24 NOTE — ED Notes (Signed)
BEHAVIORAL HEALTH ROUNDING Patient sleeping: No. Patient alert and oriented: yes Behavior appropriate: Yes.  ; If no, describe:  Nutrition and fluids offered: yes Toileting and hygiene offered: Yes  Sitter present: q15 minute observations and security camera monitoring Law enforcement present: Yes  ODS Engineer, materialssecurity officer  ENVIRONMENTAL ASSESSMENT Potentially harmful objects out of patient reach: Yes.   Personal belongings secured: Yes.   Patient dressed in hospital provided attire only: Yes.   Plastic bags out of patient reach: Yes.   Patient care equipment (cords, cables, call bells, lines, and drains) shortened, removed, or accounted for: Yes.   Equipment and supplies removed from bottom of stretcher: Yes.   Potentially toxic materials out of patient reach: Yes.   Sharps container removed or out of patient reach: Yes.

## 2017-11-24 NOTE — ED Notes (Signed)
BEHAVIORAL HEALTH ROUNDING Patient sleeping: No. Patient alert and oriented: yes Behavior appropriate: Yes.  ; If no, describe:  Nutrition and fluids offered: yes Toileting and hygiene offered: Yes  Sitter present: q15 minute observations and security camera monitoring Law enforcement present: Yes  ODS  

## 2017-11-24 NOTE — ED Notes (Signed)
ED BHU PLACEMENT JUSTIFICATION Is the patient under IVC or is there intent for IVC: Yes.   Is the patient medically cleared: Yes.   Is there vacancy in the ED BHU: Yes.   Is the population mix appropriate for patient: Yes.   Is the patient awaiting placement in inpatient or outpatient setting:  Has the patient had a psychiatric consult:  Consult pending Survey of unit performed for contraband, proper placement and condition of furniture, tampering with fixtures in bathroom, shower, and each patient room: Yes.  ; Findings:  APPEARANCE/BEHAVIOR Calm and cooperative NEURO ASSESSMENT Orientation: oriented x3  Denies pain Hallucinations:  Verbalizes auditory hallucinations are present  Speech: Normal Gait: normal RESPIRATORY ASSESSMENT Even  Unlabored respirations  CARDIOVASCULAR ASSESSMENT Pulses equal   regular rate  Skin warm and dry   GASTROINTESTINAL ASSESSMENT no GI complaint EXTREMITIES Full ROM  PLAN OF CARE Provide calm/safe environment. Vital signs assessed twice daily. ED BHU Assessment once each 12-hour shift. Collaborate with TTS daily or as condition indicates. Assure the ED provider has rounded once each shift. Provide and encourage hygiene. Provide redirection as needed. Assess for escalating behavior; address immediately and inform ED provider.  Assess family dynamic and appropriateness for visitation as needed: Yes.  ; If necessary, describe findings:  Educate the patient/family about BHU procedures/visitation: Yes.  ; If necessary, describe findings:

## 2017-11-24 NOTE — ED Notes (Signed)
BEHAVIORAL HEALTH ROUNDING Patient sleeping: No. Patient alert and oriented: yes Behavior appropriate: Yes.  ; If no, describe:  Nutrition and fluids offered: yes Toileting and hygiene offered: Yes  Sitter present: q15 minute observations and security monitoring Law enforcement present: Yes    

## 2017-11-24 NOTE — BH Assessment (Signed)
[facility] by Dr. Toni Amendlapacs.  Attending Physician will be Dr. Johnella MoloneyMcNew.   Patient has [facility] to room [facility] Clark ColonyGwen F.   Intake Paper Work has been signed and placed on patient chart.  ER staff is aware of the admission:   Misty StanleyLisa, ER Kathrynn SpeedSectary   Dr. Derrill KayGoodman, ER MD   Amy T., Patient's Nurse   Mertie ClauseJeanelle, Patient Access.

## 2017-11-24 NOTE — ED Triage Notes (Signed)
Pt presents to ED by EMS with c/o withdrawals from nicotine and auditory hallucinations. Denies SI. Pt states he had his wisdom teeth pulled today and has not been able to smoke since this morning prior to his procedure. Pt reports when he woke up from his nap he felt like he was having a panic attack so he didn't take his bedtime medications. Pt alert and quiet in triage with no distress noted.

## 2017-11-24 NOTE — ED Notes (Signed)
Patient observed lying in bed with eyes closed  Even, unlabored respirations observed   NAD pt appears to be sleeping  I will continue to monitor along with every 15 minute visual observations and ongoing security monitoring    

## 2017-11-24 NOTE — ED Notes (Signed)
Hourly rounding reveals patient in day room talking to SOC. No complaints, stable, in no acute distress. Q15 minute rounds and monitoring via Security Cameras to continue. 

## 2017-11-25 ENCOUNTER — Other Ambulatory Visit: Payer: Self-pay

## 2017-11-25 ENCOUNTER — Inpatient Hospital Stay
Admission: AD | Admit: 2017-11-25 | Discharge: 2017-12-02 | DRG: 885 | Disposition: A | Discharge status: Home / Self Care | Payer: Medicaid Other | Source: Intra-hospital | Attending: Psychiatry | Admitting: Psychiatry

## 2017-11-25 DIAGNOSIS — F250 Schizoaffective disorder, bipolar type: Secondary | ICD-10-CM | POA: Diagnosis present

## 2017-11-25 DIAGNOSIS — F17210 Nicotine dependence, cigarettes, uncomplicated: Secondary | ICD-10-CM | POA: Diagnosis present

## 2017-11-25 DIAGNOSIS — F1290 Cannabis use, unspecified, uncomplicated: Secondary | ICD-10-CM | POA: Diagnosis present

## 2017-11-25 DIAGNOSIS — F251 Schizoaffective disorder, depressive type: Secondary | ICD-10-CM

## 2017-11-25 DIAGNOSIS — G2571 Drug induced akathisia: Secondary | ICD-10-CM | POA: Diagnosis present

## 2017-11-25 DIAGNOSIS — R251 Tremor, unspecified: Secondary | ICD-10-CM | POA: Diagnosis present

## 2017-11-25 DIAGNOSIS — Z79899 Other long term (current) drug therapy: Secondary | ICD-10-CM

## 2017-11-25 LAB — LIPID PANEL
Cholesterol: 171 mg/dL (ref 0–200)
HDL: 57 mg/dL (ref >40)
LDL Cholesterol: 93 mg/dL (ref 0–99)
Total CHOL/HDL Ratio: 3 RATIO
Triglycerides: 105 mg/dL (ref <150)
VLDL: 21 mg/dL (ref 0–40)

## 2017-11-25 LAB — TSH: TSH: 3.134 u[IU]/mL (ref 0.350–4.500)

## 2017-11-25 LAB — HEMOGLOBIN A1C
HEMOGLOBIN A1C: 4.9 % (ref 4.8–5.6)
MEAN PLASMA GLUCOSE: 93.93 mg/dL

## 2017-11-25 MED ORDER — LITHIUM CARBONATE ER 450 MG PO TBCR
450.0000 mg | EXTENDED_RELEASE_TABLET | Freq: Two times a day (BID) | ORAL | Status: DC
  Administered 2017-11-25 – 2017-12-02 (×15): 450 mg via ORAL
  Filled 2017-11-25 (×15): qty 1

## 2017-11-25 MED ORDER — PROPRANOLOL HCL 20 MG PO TABS
10.0000 mg | ORAL_TABLET | Freq: Three times a day (TID) | ORAL | Status: DC
  Administered 2017-11-25 – 2017-12-02 (×23): 10 mg via ORAL
  Filled 2017-11-25 (×23): qty 1

## 2017-11-25 MED ORDER — TRAMADOL HCL 50 MG PO TABS
50.0000 mg | ORAL_TABLET | Freq: Three times a day (TID) | ORAL | Status: DC | PRN
  Administered 2017-11-25 – 2017-11-29 (×3): 50 mg via ORAL
  Filled 2017-11-25 (×3): qty 1

## 2017-11-25 MED ORDER — NICOTINE 21 MG/24HR TD PT24
21.0000 mg | MEDICATED_PATCH | Freq: Every day | TRANSDERMAL | Status: DC
  Administered 2017-11-25 – 2017-12-02 (×8): 21 mg via TRANSDERMAL
  Filled 2017-11-25 (×8): qty 1

## 2017-11-25 MED ORDER — CLOZAPINE 100 MG PO TABS
200.0000 mg | ORAL_TABLET | Freq: Every day | ORAL | Status: DC
  Administered 2017-11-25 – 2017-11-26 (×2): 200 mg via ORAL
  Filled 2017-11-25 (×2): qty 2

## 2017-11-25 MED ORDER — ALUM & MAG HYDROXIDE-SIMETH 200-200-20 MG/5ML PO SUSP: 30.0000 mL | ORAL | Status: DC | PRN

## 2017-11-25 MED ORDER — HYDROXYZINE HCL 50 MG PO TABS: 50.0000 mg | ORAL_TABLET | Freq: Three times a day (TID) | ORAL | Status: DC | PRN

## 2017-11-25 MED ORDER — TRAZODONE HCL 100 MG PO TABS
100.0000 mg | ORAL_TABLET | Freq: Every evening | ORAL | Status: DC | PRN
  Administered 2017-11-26 – 2017-11-29 (×3): 100 mg via ORAL
  Filled 2017-11-25 (×3): qty 1

## 2017-11-25 MED ORDER — ACETAMINOPHEN 325 MG PO TABS
650.0000 mg | ORAL_TABLET | Freq: Four times a day (QID) | ORAL | Status: DC | PRN
  Administered 2017-11-25 – 2017-11-30 (×5): 650 mg via ORAL
  Filled 2017-11-25 (×5): qty 2

## 2017-11-25 MED ORDER — AMANTADINE HCL 100 MG PO CAPS
100.0000 mg | ORAL_CAPSULE | Freq: Two times a day (BID) | ORAL | Status: DC
  Administered 2017-11-25 – 2017-12-02 (×16): 100 mg via ORAL
  Filled 2017-11-25 (×15): qty 1

## 2017-11-25 MED ORDER — MAGNESIUM HYDROXIDE 400 MG/5ML PO SUSP
30.0000 mL | Freq: Every day | ORAL | Status: DC | PRN
  Administered 2017-11-27: 30 mL via ORAL
  Filled 2017-11-25: qty 30

## 2017-11-25 NOTE — H&P (Addendum)
Psychiatric Admission Assessment Adult  Patient Identification: Anthony Luna MRN:  [id number] Date of Evaluation:  11/25/2017 Chief Complaint:  Worsening voices Principal Diagnosis: Schizoaffective disorder, depressive type (HCC) Diagnosis:   Patient Active Problem List   Diagnosis Date Noted  . Schizoaffective disorder, depressive type (HCC) [F25.1] 11/25/2017    Priority: High  . Schizoaffective disorder (HCC) [F25.9] 09/25/2017  . Cannabis use disorder, severe, dependence (HCC) [F12.20] 11/30/2014  . Schizoaffective disorder, bipolar type (HCC) [F25.0] 11/30/2014  . Tobacco use disorder [F17.200] 11/30/2014   History of Present Illness: 32 yo male with history of schizophrenia admitted due to worsening AH. He is known to this provider from previous admission in April in which he was started on Clozapine. Pt He states that he had his wisdom teeth out recently and did not take his medicines that day. He started hearing worsening voices that have been louder than usual. He states that they say things such as "go back to the hospital." He states taht they have been very bothersome and upsetting. He states that he also hears babies crying through the wall. He reports not sleeping well at home. He admits that sometimes he does miss doses of his medications, about 2 x a week but overall is compliant. He works with CST through [employer]. He sees Earleen Reaper 3 times a week. He still lives independently and goes back and forth with whether or not he wants to go to a group home. He is very tired today and is in bed most of the morning. He states that Dr. Boneta Lucks has not made any medication changes recently. He denies SI or HI. He reports VH of "black shadows." He admits to smoking marijuana every other day. Denies alcohol or other drug use. He likes to play basketball and has been doing this in his free time.   Associated Signs/Symptoms: Depression Symptoms:  depressed mood, (Hypo) Manic  Symptoms:  Impulsivity, Anxiety Symptoms:  Excessive Worry, Psychotic Symptoms:  Hallucinations: Auditory Visual PTSD Symptoms: Negative Total Time spent with patient: 45 minutes  Past Psychiatric History: history of schizoaffective disorder. He has had multiple hospitalizations. Last was in April. He reports one suicide attempt by banging his head on the floor "a long time ago."   Is the patient at risk to self? No.  Has the patient been a risk to self in the past 6 months? No.  Has the patient been a risk to self within the distant past? No.  Is the patient a risk to others? No.  Has the patient been a risk to others in the past 6 months? No.  Has the patient been a risk to others within the distant past? No.    Alcohol Screening: Patient refused Alcohol Screening Tool: Yes Intervention/Follow-up: Patient Refused Substance Abuse History in the last 12 months:  Yes.  , Marijuana Consequences of Substance Abuse: Negative Previous Psychotropic Medications: Yes  Psychological Evaluations: Yes  Past Medical History:  Past Medical History:  Diagnosis Date  . Depression    Since moving into new apartment 1 year ago  . None to low serum cortisol response with adrenocorticotrophic hormone (ACTH) stimulation test   . Schizophrenia, paranoid type (HCC)    History reviewed. No pertinent surgical history. Family History: History reviewed. No pertinent family history. Family Psychiatric  History: Unknown Tobacco Screening: Have you used any form of tobacco in the last 30 days? (Cigarettes, Smokeless Tobacco, Cigars, and/or Pipes): Yes Tobacco use, Select all that apply: 5 or more  cigarettes per day Are you interested in Tobacco Cessation Medications?: No, patient refused Counseled patient on smoking cessation including recognizing danger situations, developing coping skills and basic information about quitting provided: Refused/Declined practical counseling Social History: Pt lives  independently. He has CST team who check on him 3 times a week.   Additional Social History:      Pain Medications: See PTA Prescriptions: See PTA Over the Counter: See PTA History of alcohol / drug use?: Yes(past) Longest period of sobriety (when/how long): unknown Negative Consequences of Use: Financial, Personal relationships, Work / Programmer, multimedia Withdrawal Symptoms: Other (Comment)(none) Name of Substance 1: marijuana 1 - Age of First Use: 17 1 - Amount (size/oz): varies 1 - Frequency: infrequent 1 - Duration: Unable to quantify 1 - Last Use / Amount: Unknown                  Allergies:  No Known Allergies Lab Results:  Results for orders placed or performed during the hospital encounter of 11/25/17 (from the past 48 hour(s))  Hemoglobin A1c     Status: None   Collection Time: 11/25/17  7:08 AM  Result Value Ref Range   Hgb A1c MFr Bld 4.9 4.8 - 5.6 %    Comment: (NOTE) Pre diabetes:          5.7%-6.4% Diabetes:              >6.4% Glycemic control for   <7.0% adults with diabetes    Mean Plasma Glucose 93.93 mg/dL    Comment: Performed at [facility], [address] [place], [place] [address]  Lipid panel     Status: None   Collection Time: 11/25/17  7:08 AM  Result Value Ref Range   Cholesterol 171 0 - 200 mg/dL   Triglycerides 604 <540 mg/dL   HDL 57 >98 mg/dL   Total CHOL/HDL Ratio 3.0 RATIO   VLDL 21 0 - 40 mg/dL   LDL Cholesterol 93 0 - 99 mg/dL    Comment:        Total Cholesterol/HDL:CHD Risk Coronary Heart Disease Risk Table                     Men   Women  1/2 Average Risk   3.4   3.3  Average Risk       5.0   4.4  2 X Average Risk   9.6   7.1  3 X Average Risk  23.4   11.0        Use the calculated Patient Ratio above and the CHD Risk Table to determine the patient's CHD Risk.        ATP III CLASSIFICATION (LDL):  <100     mg/dL   Optimal  119-147  mg/dL   Near or Above                    Optimal  130-159  mg/dL   Borderline   829-562  mg/dL   High  >130     mg/dL   Very High Performed at [facility], [address] [place], [place] [address]   TSH     Status: None   Collection Time: 11/25/17  7:08 AM  Result Value Ref Range   TSH 3.134 0.350 - 4.500 uIU/mL    Comment: Performed by a 3rd Generation assay with a functional sensitivity of <=0.01 uIU/mL. Performed at [facility], [address], [place], [place]  [address]     Blood Alcohol level:  Lab Results  Component Value Date   ETH <10 11/24/2017   ETH <10 09/25/2017    Metabolic Disorder Labs:  Lab Results  Component Value Date   HGBA1C 4.9 11/25/2017   MPG 93.93 11/25/2017   MPG 88.19 09/26/2017   Lab Results  Component Value Date   PROLACTIN 6.1 12/19/2014   PROLACTIN 1.3 (L) 11/30/2014   Lab Results  Component Value Date   CHOL 171 11/25/2017   TRIG 105 11/25/2017   HDL 57 11/25/2017   CHOLHDL 3.0 11/25/2017   VLDL 21 11/25/2017   LDLCALC 93 11/25/2017   LDLCALC 85 09/26/2017    Current Medications: Current Facility-Administered Medications  Medication Dose Route Frequency Provider Last Rate Last Dose  . acetaminophen (TYLENOL) tablet 650 mg  650 mg Oral Q6H PRN [name], [name], MD   650 mg at 11/25/17 0900  . alum & mag hydroxide-simeth (MAALOX/MYLANTA) 200-200-20 MG/5ML suspension 30 mL  30 mL Oral Q4H PRN [name], [name], MD      . amantadine (SYMMETREL) capsule 100 mg  100 mg Oral BID [name], [name], MD   100 mg at 11/25/17 0858  . cloZAPine (CLOZARIL) tablet 200 mg  200 mg Oral QHS [name], [name], MD      . hydrOXYzine (ATARAX/VISTARIL) tablet 50 mg  50 mg Oral TID PRN [name], [name], MD      . lithium carbonate (ESKALITH) CR tablet 450 mg  450 mg Oral Q12H [name], [name], MD   450 mg at 11/25/17 0858  . magnesium hydroxide (MILK OF MAGNESIA) suspension 30 mL  30 mL Oral Daily PRN [name], [name], MD      . nicotine (NICODERM CQ - dosed in mg/24 hours) patch 21 mg  21 mg Transdermal Daily [name],  [name], MD   21 mg at 11/25/17 0857  . propranolol (INDERAL) tablet 10 mg  10 mg Oral TID [name], [name], MD   10 mg at 11/25/17 0858  . traZODone (DESYREL) tablet 100 mg  100 mg Oral QHS PRN [name], [name], MD       PTA Medications: Medications Prior to Admission  Medication Sig Dispense Refill Last Dose  . amantadine (SYMMETREL) 100 MG capsule Take 1 capsule (100 mg total) by mouth 2 (two) times daily. 60 capsule 0   . clozapine (CLOZARIL) 50 MG tablet Take 3.5 tablets (175 mg total) by mouth at bedtime. 105 tablet 0   . lithium carbonate (ESKALITH) 450 MG CR tablet Take 1 tablet (450 mg total) by mouth 2 (two) times daily. 60 tablet 0   . Paliperidone Palmitate (INVEGA TRINZA) 819 MG/2.625ML SUSP Inject 819 mg into the muscle.   Past Month at Unknown time  . propranolol (INDERAL) 10 MG tablet Take 1 tablet (10 mg total) by mouth 3 (three) times daily. 90 tablet 0     Musculoskeletal: Strength & Muscle Tone: within normal limits Gait & Station: normal Patient leans: N/A  Psychiatric Specialty Exam: Physical Exam  Nursing note and vitals reviewed.   Review of Systems  All other systems reviewed and are negative.   Blood pressure 111/73, pulse (!) 125, temperature 98.8 F (37.1 C), temperature source Oral, resp. rate 16, height 6' (1.829 m), weight 87.1 kg (192 lb), SpO2 99 %.Body mass index is 26.04 kg/m.  General Appearance: Casual  Eye Contact:  Fair  Speech:  Clear and Coherent  Volume:  Normal  Mood:  Depressed  Affect:  Constricted  Thought Process:  Coherent and Goal Directed, vague about symptoms  Orientation:  Full (Time, Place, and Person)  Thought Content:  Hallucinations: Auditory  Suicidal Thoughts:  No  Homicidal Thoughts:  No  Memory:  Immediate;   Fair  Judgement:  Fair  Insight:  Fair  Psychomotor Activity:  Normal  Concentration:  Concentration: Fair  Recall:  FiservFair  Fund of Knowledge:  Fair  Language:  Fair  Akathisia:  No      Assets:   Resilience  ADL's:  Intact  Cognition:  WNL  Sleep:  Number of Hours: 1.15    Treatment Plan Summary: 32 yo male admitted due to worsening AH. He is vague with symptoms and confused at times about his symptoms and not able to elaborate much. He recently got his wisdom teeth removed and having a lot of pain. He is overall organized in thought. He is overall compliant with meds but does miss a dose here and there. His Clozapine is increased to 200 mg. Will continue that for now and monitor symptoms. He is also on [place] Trinza. Per last hospitalization, his last injection was 09/28/17 so would be due in July. [facility] get [facility] from [facility].   Plan:  Schizoaffective disorder -Continue Clozapine 200 mg qhs. Will check level -Restart Lithium 450 mg BID. Check level in the morning  -He is on [place]ve[place] Trinza. Per last hospitalization, his last injection was 09/28/17 -Will order EKG  Tremor/akathisia -Restart symmetrel and propranolol  Pain from Wisdom teeth extraction -prn tylenol and prn tramadol for severe pain  Dispo -CSW to contact CST team. Pt wavers with wanting to go to group home. Will discuss this with his team.     Observation Level/Precautions:  15 minute checks  Laboratory:  Lithium and Clozapine level  Psychotherapy:    Medications:    Consultations:    Discharge Concerns:    Estimated Luna: 5-7 days  Other:     Physician Treatment Plan for Primary Diagnosis: Schizoaffective disorder, depressive type (HCC) Long Term Goal(s): Improvement in symptoms so as ready for discharge  Short Term Goals: Ability to demonstrate self-control will improve   I certify that inpatient services furnished can reasonably be expected to improve the patient's condition.    Haskell RilingHolly R Evelean Bigler, MD 6/12/201911:54 AM

## 2017-11-25 NOTE — Progress Notes (Signed)
Recreation Therapy Notes  Date: 11/25/2017  Time: 9:30 am   Location: Craft Room   Behavioral response: N/A   Intervention Topic:  Communication  Discussion/Intervention: Patient did not attend group.   Clinical Observations/Feedback:  Patient did not attend group.   Ashby Moskal LRT/CTRS        Anthony Luna 11/25/2017 11:25 AM 
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
## 2017-11-25 NOTE — Tx Team (Signed)
Initial Treatment Plan 11/25/2017 4:07 AM Doree BarthelBrandon Wayne Zenk RUE:[id number]:[id number]    PATIENT STRESSORS: Financial difficulties Occupational concerns Substance abuse   PATIENT STRENGTHS: Capable of independent living Motivation for treatment/growth Physical Health Supportive family/friends   PATIENT IDENTIFIED PROBLEMS: Depression "I am having panic attacks"  Anxiety "I hear voices and need them to stop. "  Substance Abuse "I use pot and am w/d from not having a cigarette but I don't want to stop smoking"                 DISCHARGE CRITERIA:  Ability to meet basic life and health needs Adequate post-discharge living arrangements Improved stabilization in mood, thinking, and/or behavior Medical problems require only outpatient monitoring Motivation to continue treatment in a less acute level of care Reduction of life-threatening or endangering symptoms to within safe limits Verbal commitment to aftercare and medication compliance  PRELIMINARY DISCHARGE PLAN: Attend aftercare/continuing care group Attend 12-step recovery group Outpatient therapy Return to previous living arrangement  PATIENT/FAMILY INVOLVEMENT: This treatment plan has been presented to and reviewed with the patient, Doree BarthelBrandon Wayne Yarbro.  The patient has been given the opportunity to ask questions and make suggestions.  Sylvan CheeseSteven M Sharne Linders, RN 11/25/2017, 4:07 AM

## 2017-11-25 NOTE — BHH Group Notes (Signed)
BHH Group Notes:  (Nursing/MHT/Case Management/Adjunct)  Date:  11/25/2017  Time:  9:41 PM  Type of Therapy:  Group Therapy  Participation Level:  Active  Participation Quality:  Appropriate  Affect:  Appropriate  Cognitive:  Appropriate  Insight:  Appropriate  Engagement in Group:  Engaged  Modes of Intervention:  Discussion  Summary of Progress/Problems:  Burt EkJanice Marie Pihu Basil 11/25/2017, 9:41 PM

## 2017-11-25 NOTE — ED Notes (Signed)
Hourly rounding reveals patient sleeping in room. No complaints, stable, in no acute distress. Q15 minute rounds and monitoring via Security Cameras to continue. 

## 2017-11-25 NOTE — Progress Notes (Signed)
ADMISSION NOTE: Patient known from previous encounters.  This is a 32 year old man with a history of psychotic disorder.  He comes voluntarily to the emergency room saying that he is having auditory hallucinations that are happening frequently sometimes seeming like they go on all day.  He says he knows they are not real but they are very upsetting to him.  He describes them as saying things that do not make sense.  He says they will frequently say things like "look left".  He is having nightmares frequently.  He feels so distressed by this he is having suicidal thoughts and thought about pounding his head on the floor.  No homicidal ideation.  Claims that he is taking his medication reliably.  Admits that he smokes marijuana still pretty frequently sometimes almost every day.  Denies other drug abuse.  Patient is specifically requesting that he moved back into a group home saying that he does not think that it is working to be living by himself.  Social history: Patient does not have much family that he stays in contact with.  Very little social life.  He says the act team has been trying to get him involved in vocational rehab but otherwise he spends most of his time sitting around his apartment smoking weed listening to music and feeling lonely.  Currently denies SI, HI and AVH.  Pt sts he hears voices during assessment but it is just "chatter" with nothing he can understand.  Pt denies HI and A/H.  Pt contracts for safety.  Pt is Guineahungary and requests food.  Pt given cereal, milk and drink.  Pt in bed at this time.  Pt remains safe on unit.

## 2017-11-25 NOTE — [facility] Suicide Risk Assessment (Signed)
[facility]   Nursing information obtained from:  Patient Demographic factors:  Male, Low socioeconomic status Current Mental Status:  NA(denies) Loss Factors:  Legal issues Historical Factors:  Prior suicide attempts Risk Reduction Factors:  Responsible for children under 31 years of age, Sense of responsibility to family, Positive social support  Total Time spent with patient: 45 minutes Principal Problem: Schizoaffective disorder, depressive type (HCC) Diagnosis:   Patient Active Problem List   Diagnosis Date Noted  . Schizoaffective disorder, depressive type (HCC) [F25.1] 11/25/2017    Priority: High  . Cannabis use disorder, severe, dependence (HCC) [F12.20] 11/30/2014  . Schizoaffective disorder, bipolar type (HCC) [F25.0] 11/30/2014  . Tobacco use disorder [F17.200] 11/30/2014   Subjective Data: See H&P  Continued Clinical Symptoms:    The "Alcohol Use Disorders Identification Test", Guidelines for Use in Primary Care, Second Edition.  World Science writerHealth Organization [facility](WHO). Score between 0-7:  no or low risk or alcohol related problems. Score between 8-15:  moderate risk of alcohol related problems. Score between 16-19:  high risk of alcohol related problems. Score 20 or above:  warrants further diagnostic evaluation for alcohol dependence and treatment.   CLINICAL FACTORS:   Schizophrenia:   Less than 31 years old    COGNITIVE FEATURES THAT CONTRIBUTE TO RISK:  None    SUICIDE RISK:   Mild:  Suicidal ideation of limited frequency, intensity, duration, and specificity.  There are no identifiable plans, no associated intent, mild dysphoria and related symptoms, good self-control (both objective and subjective assessment), few other risk factors, and identifiable protective factors, including available and accessible social support.  PLAN OF CARE: See H&P  I certify that inpatient services furnished can reasonably be expected to improve the  patient's condition.   Haskell RilingHolly R Matheus Spiker, MD 11/25/2017, 12:10 PM

## 2017-11-25 NOTE — Progress Notes (Signed)
D- Patient alert and oriented. Patient presents in a sad/sullen mood on assessment stating that he slept ok lastnight. Patient reports to this writer that his pain level is a "3/10" because "I got my wisdom teeth pulled", and patient did request pain medication from this Clinical research associatewriter. Patient rates his anxiety level a "3/10", however, he can not state to this Clinical research associatewriter why he is feeling this way. Patient denies SI, HI, AVH, at this time. Patient also denies any signs/symptoms of depression stating "not that I know of". Patient's goal for today is to "be myself".  A- Scheduled medications administered to patient, per MD orders. Support and encouragement provided.  Routine safety checks conducted every 15 minutes.  Patient informed to notify staff with problems or concerns.  R- No adverse drug reactions noted. Patient contracts for safety at this time. Patient compliant with medications and treatment plan. Patient receptive, calm, and cooperative. Patient interacts well with others on the unit.  Patient remains safe at this time.

## 2017-11-25 NOTE — Progress Notes (Signed)
Clozapine monitoring  ANC 11.7. Patient eligible and lab submitted per [employer] website.   Fulton ReekMatt Marcena Dias, PharmD, BCPS  11/25/17 4:47 AM

## 2017-11-25 NOTE — [facility] Group Notes (Signed)
LCSW Group Therapy Note  11/25/2017 1:00 pm  Type of Therapy/Topic:  Group Therapy:  Emotion Regulation  Participation Level:  Minimal   Description of Group:    The purpose of this group is to assist patients in learning to regulate negative emotions and experience positive emotions. Patients will be guided to discuss ways in which they have been vulnerable to their negative emotions. These vulnerabilities will be juxtaposed with experiences of positive emotions or situations, and patients will be challenged to use positive emotions to combat negative ones. Special emphasis will be placed on coping with negative emotions in conflict situations, and patients will process healthy conflict resolution skills.  Therapeutic Goals: 1. Patient will identify two positive emotions or experiences to reflect on in order to balance out negative emotions 2. Patient will label two or more emotions that they find the most difficult to experience 3. Patient will demonstrate positive conflict resolution skills through discussion and/or role plays  Summary of Patient Progress: Anthony Luna did not actively participate in today's group discussion on emotion regulation.  Anthony Luna presented with very blunted affect. With prompted from CSW, Anthony Luna was able to identify one emotion that he finds to be the most difficult to experience which is anxiousness.        Therapeutic Modalities:   Cognitive Behavioral Therapy Feelings Identification Dialectical Behavioral Therapy

## 2017-11-25 NOTE — Tx Team (Addendum)
Interdisciplinary Treatment and Diagnostic Plan Update  11/25/2017 Time of Session: 11:25 AM Anthony BarthelBrandon Wayne Luna MRN: [id number]  Principal Diagnosis: Schizoaffective disorder, depressive type [facility](HCC)  Secondary Diagnoses: Principal Problem:   Schizoaffective disorder, depressive type (HCC)   Current Medications:  Current Facility-Administered Medications  Medication Dose Route Frequency Provider Last Rate Last Dose  . acetaminophen (TYLENOL) tablet 650 mg  650 mg Oral Q6H PRN [name], [name], MD   650 mg at 11/25/17 0900  . alum & mag hydroxide-simeth (MAALOX/MYLANTA) 200-200-20 MG/5ML suspension 30 mL  30 mL Oral Q4H PRN [name], [name], MD      . amantadine (SYMMETREL) capsule 100 mg  100 mg Oral BID [name], [name], MD   100 mg at 11/25/17 0858  . cloZAPine (CLOZARIL) tablet 200 mg  200 mg Oral QHS [name], [name], MD      . hydrOXYzine (ATARAX/VISTARIL) tablet 50 mg  50 mg Oral TID PRN [name], [name], MD      . lithium carbonate (ESKALITH) CR tablet 450 mg  450 mg Oral Q12H [name], [name], MD   450 mg at 11/25/17 0858  . magnesium hydroxide (MILK OF MAGNESIA) suspension 30 mL  30 mL Oral Daily PRN [name], [name], MD      . nicotine (NICODERM CQ - dosed in mg/24 hours) patch 21 mg  21 mg Transdermal Daily [name], [name], MD   21 mg at 11/25/17 0857  . propranolol (INDERAL) tablet 10 mg  10 mg Oral TID [name], [name], MD   10 mg at 11/25/17 1243  . traMADol (ULTRAM) tablet 50 mg  50 mg Oral Q8H PRN [name], Ileene HutchinsonHolly R, MD   50 mg at 11/25/17 1243  . traZODone (DESYREL) tablet 100 mg  100 mg Oral QHS PRN [name], [name], MD       PTA Medications: Medications Prior to Admission  Medication Sig Dispense Refill Last Dose  . amantadine (SYMMETREL) 100 MG capsule Take 1 capsule (100 mg total) by mouth 2 (two) times daily. 60 capsule 0   . clozapine (CLOZARIL) 50 MG tablet Take 3.5 tablets (175 mg total) by mouth at bedtime. 105 tablet 0   . lithium carbonate (ESKALITH) 450 MG CR tablet  Take 1 tablet (450 mg total) by mouth 2 (two) times daily. 60 tablet 0   . Paliperidone Palmitate (INVEGA TRINZA) 819 MG/2.625ML SUSP Inject 819 mg into the muscle.   Past Month at Unknown time  . propranolol (INDERAL) 10 MG tablet Take 1 tablet (10 mg total) by mouth 3 (three) times daily. 90 tablet 0     Patient Stressors: Financial difficulties Occupational concerns Substance abuse  Patient Strengths: Capable of independent living Motivation for treatment/growth Physical Health Supportive family/friends  Treatment Modalities: Medication Management, Group therapy, Case management,  1 to 1 session with clinician, Psychoeducation, Recreational therapy.   Physician Treatment Plan for Primary Diagnosis: Schizoaffective disorder, depressive type (HCC) Long Term Goal(s): Improvement in symptoms so as ready for discharge   Short Term Goals: Ability to demonstrate self-control will improve  Medication Management: Evaluate patient's response, side effects, and tolerance of medication regimen.  Therapeutic Interventions: 1 to 1 sessions, Unit Group sessions and Medication administration.  Evaluation of Outcomes: Progressing  Physician Treatment Plan for Secondary Diagnosis: Principal Problem:   Schizoaffective disorder, depressive type (HCC)  Long Term Goal(s): Improvement in symptoms so as ready for discharge   Short Term Goals: Ability to demonstrate self-control will improve     Medication  Management: Evaluate patient's response, side effects, and tolerance of medication regimen.  Therapeutic Interventions: 1 to 1 sessions, Unit Group sessions and Medication administration.  Evaluation of Outcomes: Progressing   RN Treatment Plan for Primary Diagnosis: Schizoaffective disorder, depressive type (HCC) Long Term Goal(s): Knowledge of disease and therapeutic regimen to maintain health will improve  Short Term Goals: Ability to verbalize feelings will improve, Ability to identify  and develop effective coping behaviors will improve and Compliance with prescribed medications will improve  Medication Management: RN will administer medications as ordered by provider, will assess and evaluate patient's response and provide education to patient for prescribed medication. RN will report any adverse and/or side effects to prescribing provider.  Therapeutic Interventions: 1 on 1 counseling sessions, Psychoeducation, Medication administration, Evaluate responses to treatment, Monitor vital signs and CBGs as ordered, Perform/monitor CIWA, COWS, AIMS and Fall Risk screenings as ordered, Perform wound care treatments as ordered.  Evaluation of Outcomes: Progressing   LCSW Treatment Plan for Primary Diagnosis: Schizoaffective disorder, depressive type (HCC) Long Term Goal(s): Safe transition to appropriate next level of care at discharge, Engage patient in therapeutic group addressing interpersonal concerns.  Short Term Goals: Engage patient in aftercare planning with referrals and resources and Increase skills for wellness and recovery  Therapeutic Interventions: Assess for all discharge needs, 1 to 1 time with Social worker, Explore available resources and support systems, Assess for adequacy in community support network, Educate family and significant other(s) on suicide prevention, Complete Psychosocial Assessment, Interpersonal group therapy.  Evaluation of Outcomes: Progressing   Progress in Treatment: Attending groups: Yes. Participating in groups: No. Taking medication as prescribed: Yes. Toleration medication: Yes. Family/Significant other contact made: No, will contact:  CSW received consent to contact pt's sister, Burel Kahre. Patient understands diagnosis: Yes. Discussing patient identified problems/goals with staff: Yes. Medical problems stabilized or resolved: Yes. Denies suicidal/homicidal ideation: Yes. Issues/concerns per patient self-inventory: No. 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  Reason for Continuation of Hospitalization: Anxiety Hallucinations Medication stabilization  Estimated Length of Stay: 3-5 days  Recreational Therapy: Patient Stressors: N/A Patient Goal: Patient will identify 3 triggers to anxiety within 5 recreation therapy group sessions  Attendees: Patient: 11/25/2017 3:11 PM  Physician: Corinna Gab, MD 11/25/2017 3:11 PM  Nursing: Hulan Amato, RN 11/25/2017 3:11 PM  RN Care Manager: 11/25/2017 3:11 PM  Social Worker: Huey Romans, LCSW 11/25/2017 3:11 PM  Recreational Therapist: Garret Reddish, LRT 11/25/2017 3:11 PM  Other: Heidi Dach, LCSW 11/25/2017 3:11 PM  Other: Johny Shears, LCSWA 11/25/2017 3:11 PM  Other:John Sabino Gasser, Chaplain 11/25/2017 3:11 PM    Scribe for Treatment Team: Alease Frame, LCSW 11/25/2017 3:11 PM

## 2017-11-25 NOTE — Progress Notes (Signed)
Recreation Therapy Notes  INPATIENT RECREATION THERAPY ASSESSMENT  Patient Details Name: Doree BarthelBrandon Wayne Hansmann MRN: [id number] DOB: 02/10/1986 Today's Date: 11/25/2017       Information Obtained From: Patient  Able to Participate in Assessment/Interview: Yes  Patient Presentation: Responsive  Reason for Admission (Per Patient): Med Non-Compliance, Other (Comments)(My anxiety)  Patient Stressors:    Coping Skills:   Music, Read  Leisure Interests (2+):  Music - Listen, Sports - Basketball  Frequency of Recreation/Participation: Chief Executive OfficerMonthly  Awareness of Community Resources:  Yes  Community Resources:  [employer], [employer]  Current Use: Yes  If no, Barriers?:    Expressed Interest in [employer] CorporationCommunity Resource Information:    [place]  Patient Main Form of Transportation: Other (Comment)(Peer Support)  Patient Strengths:  I have a good personality  Patient Identified Areas of Improvement:  Hang around positive people   Patient Goal for Hospitalization:  To take my medication  Current SI (including self-harm):  No  Current HI:  No  Current AVH: No  Staff Intervention Plan: Group Attendance, Collaborate with Interdisciplinary Treatment Team  Consent to Intern Participation: N/A  Sherlyne Crownover 11/25/2017, 3:26 PM

## 2017-11-25 NOTE — Plan of Care (Signed)
Patient's overall level of comfort has improved and he has remained free from injury on the unit at this time. Patient has the ability to make informed decisions regarding his care as well as identify the available resources that can help assist him in meeting his health care needs. Patient has the ability to cope and has been observed out in the milieu interacting well with other members on the unit without any issues thus far. Patient verbalizes understanding of and has been in compliance with his prescribed therapeutic regimen and has not voiced any further questions or concerns at this time. Patient stated to this writer that his goal for today was to "be myself". Patient denies SI/HI/AVH as well as any signs/symptoms of depression at this time. Patient has the ability to identify the needed changes in his lifestyle in order to prevent recurrence of his condition. Patient rates his anxiety level a "3/10", but he can not state to this writer why he is slightly anxious. Patient has been present out in the milieu for majority of the day and has been demonstrating positive changes in social behaviors and relationships. Patient remains free from injury and is safe on the unit at this time.  Problem: Pain Managment: Goal: General experience of comfort will improve Outcome: Progressing   Problem: Safety: Goal: Ability to remain free from injury will improve Outcome: Progressing   Problem: Education: Goal: Ability to make informed decisions regarding treatment will improve Outcome: Progressing   Problem: Coping: Goal: Coping ability will improve Outcome: Progressing   Problem: Health Behavior/Discharge Planning: Goal: Identification of resources available to assist in meeting health care needs will improve Outcome: Progressing   Problem: Medication: Goal: Compliance with prescribed medication regimen will improve Outcome: Progressing   Problem: Self-Concept: Goal: Ability to disclose and  discuss suicidal ideas will improve Outcome: Progressing Goal: Will verbalize positive feelings about self Outcome: Progressing   Problem: Health Behavior/Discharge Planning: Goal: Ability to identify changes in lifestyle to reduce recurrence of condition will improve Outcome: Progressing Goal: Identification of resources available to assist in meeting health care needs will improve Outcome: Progressing   Problem: Safety: Goal: Ability to remain free from injury will improve Outcome: Progressing   Problem: Education: Goal: Ability to state activities that reduce stress will improve Outcome: Progressing   Problem: Coping: Goal: Ability to identify and develop effective coping behavior will improve Outcome: Progressing   Problem: Self-Concept: Goal: Ability to identify factors that promote anxiety will improve Outcome: Progressing Goal: Level of anxiety will decrease Outcome: Progressing Goal: Ability to modify response to factors that promote anxiety will improve Outcome: Progressing   Problem: Education: Goal: Utilization of techniques to improve thought processes will improve Outcome: Progressing Goal: Knowledge of the prescribed therapeutic regimen will improve Outcome: Progressing   Problem: Activity: Goal: Interest or engagement in leisure activities will improve Outcome: Progressing Goal: Imbalance in normal sleep/wake cycle will improve Outcome: Progressing   Problem: Coping: Goal: Coping ability will improve Outcome: Progressing Goal: Will verbalize feelings Outcome: Progressing   Problem: Health Behavior/Discharge Planning: Goal: Ability to make decisions will improve Outcome: Progressing Goal: Compliance with therapeutic regimen will improve Outcome: Progressing   Problem: Role Relationship: Goal: Will demonstrate positive changes in social behaviors and relationships Outcome: Progressing   Problem: Safety: Goal: Ability to disclose and discuss  suicidal ideas will improve Outcome: Progressing Goal: Ability to identify and utilize support systems that promote safety will improve Outcome: Progressing   Problem: Self-Concept: Goal: Will verbalize positive feelings about  self Outcome: Progressing Goal: Level of anxiety will decrease Outcome: Progressing

## 2017-11-26 LAB — CBC WITH DIFFERENTIAL/PLATELET
BASOS ABS: 0 10*3/uL (ref 0–0.1)
BASOS PCT: 0 %
EOS PCT: 3 %
Eosinophils Absolute: 0.3 10*3/uL (ref 0–0.7)
HCT: 48.5 % (ref 40.0–52.0)
Hemoglobin: 16.3 g/dL (ref 13.0–18.0)
Lymphocytes Relative: 25 %
Lymphs Abs: 2.1 10*3/uL (ref 1.0–3.6)
MCH: 31.4 pg (ref 26.0–34.0)
MCHC: 33.7 g/dL (ref 32.0–36.0)
MCV: 93.2 fL (ref 80.0–100.0)
Monocytes Absolute: 1.3 10*3/uL — ABNORMAL HIGH (ref 0.2–1.0)
Monocytes Relative: 16 %
Neutro Abs: 4.8 10*3/uL (ref 1.4–6.5)
Neutrophils Relative %: 56 %
PLATELETS: 216 10*3/uL (ref 150–440)
RBC: 5.2 MIL/uL (ref 4.40–5.90)
RDW: 13.9 % (ref 11.5–14.5)
WBC: 8.6 10*3/uL (ref 3.8–10.6)

## 2017-11-26 LAB — LITHIUM LEVEL: LITHIUM LVL: 0.38 mmol/L — AB (ref 0.60–1.20)

## 2017-11-26 NOTE — [facility] Group Notes (Signed)
LCSW Group Therapy Note 11/26/2017 9:00AM  Type of Therapy and Topic:  Group Therapy:  Setting Goals  Participation Level:  Did Not Attend  Description of Group: In this process group, patients discussed using strengths to work toward goals and address challenges.  Patients identified two positive things about themselves and one goal they were working on.  Patients were given the opportunity to share openly and support each other's plan for self-empowerment.  The group discussed the value of gratitude and were encouraged to have a daily reflection of positive characteristics or circumstances.  Patients were encouraged to identify a plan to utilize their strengths to work on current challenges and goals.  Therapeutic Goals 1. Patient will verbalize personal strengths/positive qualities and relate how these can assist with achieving desired personal goals 2. Patients will verbalize affirmation of peers plans for personal change and goal setting 3. Patients will explore the value of gratitude and positive focus as related to successful achievement of goals 4. Patients will verbalize a plan for regular reinforcement of personal positive qualities and circumstances.  Summary of Patient Progress: Apolinar JunesBrandon was invited to today's group, but chose not to attend.      Therapeutic Modalities Cognitive Behavioral Therapy Motivational Interviewing    Alease FrameSonya S Danniel Tones, KentuckyLCS[place] 11/26/2017 11:51 AM

## 2017-11-26 NOTE — [facility] Group Notes (Signed)
  11/26/2017  Time: 1PM  Type of Therapy/Topic:  Group Therapy:  Balance in Life  Participation Level:  Active  Description of Group:   This group will address the concept of balance and how it feels and looks when one is unbalanced. Patients will be encouraged to process areas in their lives that are out of balance and identify reasons for remaining unbalanced. Facilitators will guide patients in utilizing problem-solving interventions to address and correct the stressor making their life unbalanced. Understanding and applying boundaries will be explored and addressed for obtaining and maintaining a balanced life. Patients will be encouraged to explore ways to assertively make their unbalanced needs known to significant others in their lives, using other group members and facilitator for support and feedback.  Therapeutic Goals: 1. Patient will identify two or more emotions or situations they have that consume much of in their lives. 2. Patient will identify signs/triggers that life has become out of balance:  3. Patient will identify two ways to set boundaries in order to achieve balance in their lives:  4. Patient will demonstrate ability to communicate their needs through discussion and/or role plays  Summary of Patient Progress: Pt continues to work towards their tx goals but has not yet reached them. Pt was able to appropriately participate in group discussion, and was able to offer support/validation to other group members. Pt reported one area of his life he would like to focus more on is, "my sobriety," and one area of his life he would like to devote less attention to is, "the voices."     Therapeutic Modalities:   Cognitive Behavioral Therapy Solution-Focused Therapy Assertiveness Training  Heidi DachKelsey Divonte Senger, MSW, LCSW Clinical Social Worker 11/26/2017 2:06 PM

## 2017-11-26 NOTE — Plan of Care (Signed)
Patient's overall level of comfort has improved and he has remained free from injury on the unit at this time. Patient has the ability to make informed decisions regarding his care as well as identify the available resources that can help assist him in meeting his health care needs. Patient has the ability to cope and has been observed out in the milieu interacting well with other members on the unit without any issues thus far. Patient verbalizes understanding of and has been in compliance with his prescribed therapeutic regimen and has not voiced any further questions or concerns at this time. Patient stated to this writer that his goal for today is to "stay positive". Patient denies SI/HI/AVH as well as any signs/symptoms of anxiety at this time. Patient has the ability to identify the needed changes in his lifestyle in order to prevent recurrence of his condition. Patient rates his depression level a "1/10", but he can not state to this writer why he is slightly anxious "I don' t know". Patient has been sleeping on and off throughout the day, but doesn't know why he is sleeping so much. Patient has been demonstrating positive changes in social behaviors and relationships. Patient remains free from injury and is safe on the unit at this time.  Problem: Pain Managment: Goal: General experience of comfort will improve Outcome: Progressing   Problem: Safety: Goal: Ability to remain free from injury will improve Outcome: Progressing   Problem: Education: Goal: Ability to make informed decisions regarding treatment will improve Outcome: Progressing   Problem: Coping: Goal: Coping ability will improve Outcome: Progressing   Problem: Health Behavior/Discharge Planning: Goal: Identification of resources available to assist in meeting health care needs will improve Outcome: Progressing   Problem: Medication: Goal: Compliance with prescribed medication regimen will improve Outcome: Progressing    Problem: Self-Concept: Goal: Ability to disclose and discuss suicidal ideas will improve Outcome: Progressing Goal: Will verbalize positive feelings about self Outcome: Progressing   Problem: Health Behavior/Discharge Planning: Goal: Ability to identify changes in lifestyle to reduce recurrence of condition will improve Outcome: Progressing Goal: Identification of resources available to assist in meeting health care needs will improve Outcome: Progressing   Problem: Safety: Goal: Ability to remain free from injury will improve Outcome: Progressing   Problem: Education: Goal: Ability to state activities that reduce stress will improve Outcome: Progressing   Problem: Coping: Goal: Ability to identify and develop effective coping behavior will improve Outcome: Progressing   Problem: Self-Concept: Goal: Ability to identify factors that promote anxiety will improve Outcome: Progressing Goal: Level of anxiety will decrease Outcome: Progressing Goal: Ability to modify response to factors that promote anxiety will improve Outcome: Progressing   Problem: Education: Goal: Utilization of techniques to improve thought processes will improve Outcome: Progressing Goal: Knowledge of the prescribed therapeutic regimen will improve Outcome: Progressing   Problem: Activity: Goal: Interest or engagement in leisure activities will improve Outcome: Progressing Goal: Imbalance in normal sleep/wake cycle will improve Outcome: Progressing   Problem: Coping: Goal: Coping ability will improve Outcome: Progressing Goal: Will verbalize feelings Outcome: Progressing   Problem: Health Behavior/Discharge Planning: Goal: Ability to make decisions will improve Outcome: Progressing Goal: Compliance with therapeutic regimen will improve Outcome: Progressing   Problem: Role Relationship: Goal: Will demonstrate positive changes in social behaviors and relationships Outcome: Progressing    Problem: Safety: Goal: Ability to disclose and discuss suicidal ideas will improve Outcome: Progressing Goal: Ability to identify and utilize support systems that promote safety will improve Outcome: Progressing  Problem: Self-Concept: Goal: Will verbalize positive feelings about self Outcome: Progressing Goal: Level of anxiety will decrease Outcome: Progressing

## 2017-11-26 NOTE — Progress Notes (Signed)
D- Patient alert and oriented. Patient presents in a pleasant mood on assessment stating that he slept "ok" last night and did not voice any major complaints at this time. Patient does rate his pain level a "2/10" in his right jaw, but did not request any pain medication at this time. Patient rates his depression level a "1/10" but can not voice why he is feeling slightly depressed. Patient denies SI, HI, AVH, at this time. Patient also denies any signs/symptoms of anxiety. Patient's goal for today is to "be positive".  A- Scheduled medications administered to patient, per MD orders. Support and encouragement provided.  Routine safety checks conducted every 15 minutes.  Patient informed to notify staff with problems or concerns.  R- No adverse drug reactions noted. Patient contracts for safety at this time. Patient compliant with medications and treatment plan. Patient receptive, calm, and cooperative. Patient interacts well with others on the unit.  Patient remains safe at this time.

## 2017-11-26 NOTE — Progress Notes (Signed)
[facility] MD Progress Note  11/26/2017 1:24 PM Anthony Luna  MRN:  [id number] Subjective:  Pt is in bed most of the morning today. He states that his jaw hurts from wisdom teeth extraction. He states that tramadol has been helping. He denies hearing voices currently. He is very vague with symptoms. Denies SI or HI. He feels very tired today. He was out on the unit more last night and had pleasant mood and affect.   Principal Problem: Schizoaffective disorder, depressive type (HCC) Diagnosis:   Patient Active Problem List   Diagnosis Date Noted  . Schizoaffective disorder, depressive type (HCC) [F25.1] 11/25/2017    Priority: High  . Cannabis use disorder, severe, dependence (HCC) [F12.20] 11/30/2014  . Schizoaffective disorder, bipolar type (HCC) [F25.0] 11/30/2014  . Tobacco use disorder [F17.200] 11/30/2014   Total Time spent with patient: 15 minutes  Past Psychiatric History: See h&P  Past Medical History:  Past Medical History:  Diagnosis Date  . Depression    Since moving into new apartment 1 year ago  . None to low serum cortisol response with adrenocorticotrophic hormone (ACTH) stimulation test   . Schizophrenia, paranoid type (HCC)    History reviewed. No pertinent surgical history. Family History: History reviewed. No pertinent family history. Family Psychiatric  History: See H&P Social History:  Social History   Substance and Sexual Activity  Alcohol Use Yes  . Alcohol/week: 0.0 oz   Comment: "Special occasions"     Social History   Substance and Sexual Activity  Drug Use Yes  . Frequency: 4.0 times per week  . Types: Marijuana   Comment: "2 blunts"    Social History   Socioeconomic History  . Marital status: Single    Spouse name: Not on file  . Number of children: Not on file  . Years of education: Not on file  . Highest education level: Not on file  Occupational History  . Not on file  Social Needs  . Financial resource strain: Not on file  .  Food insecurity:    Worry: Not on file    Inability: Not on file  . Transportation needs:    Medical: Not on file    Non-medical: Not on file  Tobacco Use  . Smoking status: Current Every Day Smoker    Packs/day: 0.50    Types: Cigarettes    Start date: 11/29/2002  . Smokeless tobacco: Former Neurosurgeon  . Tobacco comment: does not want to quit  Substance and Sexual Activity  . Alcohol use: Yes    Alcohol/week: 0.0 oz    Comment: "Special occasions"  . Drug use: Yes    Frequency: 4.0 times per week    Types: Marijuana    Comment: "2 blunts"  . Sexual activity: Not on file  Lifestyle  . Physical activity:    Days per week: Not on file    Minutes per session: Not on file  . Stress: Not on file  Relationships  . Social connections:    Talks on phone: Not on file    Gets together: Not on file    Attends religious service: Not on file    Active member of club or organization: Not on file    Attends meetings of clubs or organizations: Not on file    Relationship status: Not on file  Other Topics Concern  . Not on file  Social History Narrative  . Not on file   Additional Social History:    Pain Medications: See  PTA Prescriptions: See PTA Over the Counter: See PTA History of alcohol / drug use?: Yes(past) Longest period of sobriety (when/how long): unknown Negative Consequences of Use: Financial, Personal relationships, Work / Programmer, multimedia Withdrawal Symptoms: Other (Comment)(none) Name of Substance 1: marijuana 1 - Age of First Use: 17 1 - Amount (size/oz): varies 1 - Frequency: infrequent 1 - Duration: Unable to quantify 1 - Last Use / Amount: Unknown                  Sleep: Good  Appetite:  Good  Current Medications: Current Facility-Administered Medications  Medication Dose Route Frequency Provider Last Rate Last Dose  . acetaminophen (TYLENOL) tablet 650 mg  650 mg Oral Q6H PRN [name], [name], MD   650 mg at 11/25/17 1726  . alum & mag hydroxide-simeth  (MAALOX/MYLANTA) 200-200-20 MG/5ML suspension 30 mL  30 mL Oral Q4H PRN [name], [name], MD      . amantadine (SYMMETREL) capsule 100 mg  100 mg Oral BID [name], [name], MD   100 mg at 11/26/17 0859  . cloZAPine (CLOZARIL) tablet 200 mg  200 mg Oral QHS [name], [name], MD   200 mg at 11/25/17 2123  . hydrOXYzine (ATARAX/VISTARIL) tablet 50 mg  50 mg Oral TID PRN [name], [name], MD      . lithium carbonate (ESKALITH) CR tablet 450 mg  450 mg Oral Q12H [name], [name], MD   450 mg at 11/26/17 0859  . magnesium hydroxide (MILK OF MAGNESIA) suspension 30 mL  30 mL Oral Daily PRN [name], [name], MD      . nicotine (NICODERM CQ - dosed in mg/24 hours) patch 21 mg  21 mg Transdermal Daily [name], [name], MD   21 mg at 11/26/17 0900  . propranolol (INDERAL) tablet 10 mg  10 mg Oral TID [name], [name], MD   10 mg at 11/26/17 1221  . traMADol (ULTRAM) tablet 50 mg  50 mg Oral Q8H PRN [name], MD   50 mg at 11/26/17 1221  . traZODone (DESYREL) tablet 100 mg  100 mg Oral QHS PRN [name], [name], MD        Lab Results:  Results for orders placed or performed during the hospital encounter of 11/25/17 (from the past 48 hour(s))  Hemoglobin A1c     Status: None   Collection Time: 11/25/17  7:08 AM  Result Value Ref Range   Hgb A1c MFr Bld 4.9 4.8 - 5.6 %    Comment: (NOTE) Pre diabetes:          5.7%-6.4% Diabetes:              >6.4% Glycemic control for   <7.0% adults with diabetes    Mean Plasma Glucose 93.93 mg/dL    Comment: Performed at [facility], [address], [place], [place] [address]  Lipid panel     Status: None   Collection Time: 11/25/17  7:08 AM  Result Value Ref Range   Cholesterol 171 0 - 200 mg/dL   Triglycerides 829 <562 mg/dL   HDL 57 >13 mg/dL   Total CHOL/HDL Ratio 3.0 RATIO   VLDL 21 0 - 40 mg/dL   LDL Cholesterol 93 0 - 99 mg/dL    Comment:        Total Cholesterol/HDL:CHD Risk Coronary Heart Disease Risk Table  Men   Women  1/2  Average Risk   3.4   3.3  Average Risk       5.0   4.4  2 X Average Risk   9.6   7.1  3 X Average Risk  23.4   11.0        Use the calculated Patient Ratio above and the CHD Risk Table to determine the patient's CHD Risk.        ATP III CLASSIFICATION (LDL):  <100     mg/dL   Optimal  161-096  mg/dL   Near or Above                    Optimal  130-159  mg/dL   Borderline  045-409  mg/dL   High  >811     mg/dL   Very High Performed at [facility], [address] [place], [place] [address]   TSH     Status: None   Collection Time: 11/25/17  7:08 AM  Result Value Ref Range   TSH 3.134 0.350 - 4.500 uIU/mL    Comment: Performed by a 3rd Generation assay with a functional sensitivity of <=0.01 uIU/mL. Performed at [facility], [address] [place], [place] [address]   Lithium level     Status: Abnormal   Collection Time: 11/26/17  7:07 AM  Result Value Ref Range   Lithium Lvl 0.38 (L) 0.60 - 1.20 mmol/L    Comment: Performed at [facility], [address] [place], [place] [address]  CBC with Differential/Platelet     Status: Abnormal   Collection Time: 11/26/17  7:07 AM  Result Value Ref Range   WBC 8.6 3.8 - 10.6 K/uL   RBC 5.20 4.40 - 5.90 MIL/uL   Hemoglobin 16.3 13.0 - 18.0 g/dL   HCT 57.8 46.9 - 62.9 %   MCV 93.2 80.0 - 100.0 fL   MCH 31.4 26.0 - 34.0 pg   MCHC 33.7 32.0 - 36.0 g/dL   RDW 52.8 41.3 - 24.4 %   Platelets 216 150 - 440 K/uL   Neutrophils Relative % 56 %   Neutro Abs 4.8 1.4 - 6.5 K/uL   Lymphocytes Relative 25 %   Lymphs Abs 2.1 1.0 - 3.6 K/uL   Monocytes Relative 16 %   Monocytes Absolute 1.3 (H) 0.2 - 1.0 K/uL   Eosinophils Relative 3 %   Eosinophils Absolute 0.3 0 - 0.7 K/uL   Basophils Relative 0 %   Basophils Absolute 0.0 0 - 0.1 K/uL    Comment: Performed at [facility], [address], [place], [place] [address]    Blood Alcohol level:  Lab Results  Component Value Date   [facility] <10 11/24/2017    ETH <10 09/25/2017    Metabolic Disorder Labs: Lab Results  Component Value Date   HGBA1C 4.9 11/25/2017   MPG 93.93 11/25/2017   MPG 88.19 09/26/2017   Lab Results  Component Value Date   PROLACTIN 6.1 12/19/2014   PROLACTIN 1.3 (L) 11/30/2014   Lab Results  Component Value Date   CHOL 171 11/25/2017   TRIG 105 11/25/2017   HDL 57 11/25/2017   CHOLHDL 3.0 11/25/2017   VLDL 21 11/25/2017   LDLCALC 93 11/25/2017   LDLCALC 85 09/26/2017    Physical Findings: AIMS: Facial and Oral Movements Muscles of Facial Expression: None, normal Lips and Perioral Area: None, normal Jaw: None, normal Tongue: None, normal,Extremity Movements Upper (arms,  wrists, hands, fingers): None, normal Lower (legs, knees, ankles, toes): None, normal, Trunk Movements Neck, shoulders, hips: None, normal, Overall Severity Severity of abnormal movements (highest score from questions above): None, normal Incapacitation due to abnormal movements: None, normal Patient's awareness of abnormal movements (rate only patient's report): No Awareness, Dental Status Current problems with teeth and/or dentures?: Yes(wisdom tooth extraction x 4) Does patient usually wear dentures?: No  CIWA:    COWS:     Musculoskeletal: Strength & Muscle Tone: within normal limits Gait & Station: normal Patient leans: N/A  Psychiatric Specialty Exam: Physical Exam  Nursing note and vitals reviewed.   Review of Systems  All other systems reviewed and are negative.   Blood pressure 104/68, pulse 79, temperature 97.8 F (36.6 C), temperature source Oral, resp. rate 18, height 6' (1.829 m), weight 87.1 kg (192 lb), SpO2 96 %.Body mass index is 26.04 kg/m.  General Appearance: Casual  Eye Contact:  Minimal  Speech:  Slow  Volume:  Normal  Mood:  Depressed  Affect:  Constricted  Thought Process:  Coherent  Orientation:  Full (Time, Place, and Person)  Thought Content:  Logical  Suicidal Thoughts:  No  Homicidal  Thoughts:  No  Memory:  Immediate;   Fair  Judgement:  Fair  Insight:  Fair  Psychomotor Activity:  Normal  Concentration:  Concentration: Fair  Recall:  [employer]Fair  Fund of Knowledge:  Fair  Language:  Fair  Akathisia:  No      Assets:  Resilience  ADL's:  Intact  Cognition:  WNL  Sleep:  Number of Hours: 7     Treatment Plan Summary: 32 yo male admitted due to worsening AH. He has been sleeping a lot through the day but was out of his room more last night. He states that he is having some pain that is why he is in bed. He is very vague and short with responses. Hie Lithium level was low at 0.38-possible some noncompliance with medications.   Plan:  Schizoaffective disorder -Continue Clozapine 200 mg qhs. Level pending -Continue Lithium 450 mg BID, Level 0.38 -He is on [place] Trin[place]. Last injection 09/28/17  Tremor/Akathisia -Symmetrel and propranolol  Recent tooth extraction -WBC normal today -prn tramadol  Dispo -CSW to contact CST team   Haskell RilingHolly R Keenen Roessner, MD 11/26/2017, 1:24 PM

## 2017-11-26 NOTE — Progress Notes (Signed)
Recreation Therapy Notes  Date: 11/26/2017  Time: 9:30 am   Location: Craft Room   Behavioral response: N/A   Intervention Topic: Animal Assisted Therapy  Discussion/Intervention: Patient did not attend group.   Clinical Observations/Feedback:  Patient did not attend group.   Jonathon Tan LRT/CTRS        Jhonny Calixto 11/26/2017 10:30 AM

## 2017-11-26 NOTE — Plan of Care (Signed)
Pleasant mood and affect, denied pain, denied SI/HI/AVH, interacting well with peers, Medication compliant.  Patient slept for Estimated Hours of 7.0; Precautionary checks every 15 minutes for safety maintained, room free of safety hazards, patient sustains no injury or falls during this shift.  Problem: Safety: Goal: Ability to remain free from injury will improve Outcome: Progressing   Problem: Coping: Goal: Coping ability will improve Outcome: Progressing   Problem: Medication: Goal: Compliance with prescribed medication regimen will improve Outcome: Progressing   Problem: Self-Concept: Goal: Ability to disclose and discuss suicidal ideas will improve Outcome: Progressing Goal: Will verbalize positive feelings about self Outcome: Progressing   Problem: Safety: Goal: Ability to remain free from injury will improve Outcome: Progressing

## 2017-11-27 LAB — CLOZAPINE (CLOZARIL)
Clozapine Lvl: 199 ng/mL — ABNORMAL LOW (ref 350–650)
NORCLOZAPINE: 61 ng/mL
Total(Cloz+Norcloz): 260 ng/mL

## 2017-11-27 MED ORDER — CLOZAPINE 25 MG PO TABS
175.0000 mg | ORAL_TABLET | Freq: Every day | ORAL | Status: DC
  Administered 2017-11-27 – 2017-12-01 (×5): 175 mg via ORAL
  Filled 2017-11-27 (×5): qty 1

## 2017-11-27 NOTE — Progress Notes (Signed)
D- Patient alert and oriented. Patient presents in a pleasant mood on assessment stating that he slept "pretty good" last night. Patient states that overall he is feeling "ok". Patient rates his anxiety level a "1/10", but does not know why he is feeling this way. Patient denies SI, HI, AVH, at this time. Patient's goal for today is to "be myself", in which he will "listen more" to accomplish this goal.  A- Scheduled medications administered to patient, per MD orders. Support and encouragement provided.  Routine safety checks conducted every 15 minutes.  Patient informed to notify staff with problems or concerns.  R- No adverse drug reactions noted. Patient contracts for safety at this time. Patient compliant with medications and treatment plan. Patient receptive, calm, and cooperative. Patient interacts well with others on the unit.  Patient remains safe at this time.

## 2017-11-27 NOTE — Plan of Care (Signed)
Patient's overall level of comfort has improved and he has remained free from injury on the unit. Patient has the ability to make informed decisions regarding his health care as well as identify the available resources that can assist him in meeting his health care needs. Patient has the ability to cope and has been observed periodically out in the milieu interacting well with other members on the unit without any issues thus far. Patient verbalizes understanding of and has been in compliance with his prescribed therapeutic regimen and has not voiced any further questions or concerns at this time. Patient stated to this writer that his goal for today is to "be myself". Patient denies SI/HI/AVH as well as any signs/symptoms of depression at this time. Patient has the ability to identify the needed changes in his lifestyle in order to prevent recurrence of his condition. Patient rates his anxiety a "1/10", but he can not state to this writer why he is slightly anxious. Patient continues to sleep on and off throughout the day. Patient has been demonstrating positive changes in social behaviors and relationships. Patient remains safe on the unit at this time.  Problem: Pain Managment: Goal: General experience of comfort will improve Outcome: Progressing   Problem: Safety: Goal: Ability to remain free from injury will improve Outcome: Progressing   Problem: Education: Goal: Ability to make informed decisions regarding treatment will improve Outcome: Progressing   Problem: Coping: Goal: Coping ability will improve Outcome: Progressing   Problem: Health Behavior/Discharge Planning: Goal: Identification of resources available to assist in meeting health care needs will improve Outcome: Progressing   Problem: Medication: Goal: Compliance with prescribed medication regimen will improve Outcome: Progressing   Problem: Self-Concept: Goal: Ability to disclose and discuss suicidal ideas will  improve Outcome: Progressing Goal: Will verbalize positive feelings about self Outcome: Progressing   Problem: Health Behavior/Discharge Planning: Goal: Ability to identify changes in lifestyle to reduce recurrence of condition will improve Outcome: Progressing Goal: Identification of resources available to assist in meeting health care needs will improve Outcome: Progressing   Problem: Safety: Goal: Ability to remain free from injury will improve Outcome: Progressing   Problem: Education: Goal: Ability to state activities that reduce stress will improve Outcome: Progressing   Problem: Coping: Goal: Ability to identify and develop effective coping behavior will improve Outcome: Progressing   Problem: Self-Concept: Goal: Ability to identify factors that promote anxiety will improve Outcome: Progressing Goal: Level of anxiety will decrease Outcome: Progressing Goal: Ability to modify response to factors that promote anxiety will improve Outcome: Progressing   Problem: Education: Goal: Utilization of techniques to improve thought processes will improve Outcome: Progressing Goal: Knowledge of the prescribed therapeutic regimen will improve Outcome: Progressing   Problem: Activity: Goal: Interest or engagement in leisure activities will improve Outcome: Progressing Goal: Imbalance in normal sleep/wake cycle will improve Outcome: Progressing   Problem: Coping: Goal: Coping ability will improve Outcome: Progressing Goal: Will verbalize feelings Outcome: Progressing   Problem: Health Behavior/Discharge Planning: Goal: Ability to make decisions will improve Outcome: Progressing Goal: Compliance with therapeutic regimen will improve Outcome: Progressing   Problem: Role Relationship: Goal: Will demonstrate positive changes in social behaviors and relationships Outcome: Progressing   Problem: Safety: Goal: Ability to disclose and discuss suicidal ideas will  improve Outcome: Progressing Goal: Ability to identify and utilize support systems that promote safety will improve Outcome: Progressing   Problem: Self-Concept: Goal: Will verbalize positive feelings about self Outcome: Progressing Goal: Level of anxiety will decrease Outcome: Progressing

## 2017-11-27 NOTE — [facility] Group Notes (Signed)
LCSW Group Therapy Note  11/27/2017 1:00 pm  Type of Therapy and Topic:  Group Therapy:  Feelings around Relapse and Recovery  Participation Level:  Active   Description of Group:    Patients in this group will discuss emotions they experience before and after a relapse. They will process how experiencing these feelings, or avoidance of experiencing them, relates to having a relapse. Facilitator will guide patients to explore emotions they have related to recovery. Patients will be encouraged to process which emotions are more powerful. They will be guided to discuss the emotional reaction significant others in their lives may have to their relapse or recovery. Patients will be assisted in exploring ways to respond to the emotions of others without this contributing to a relapse.  Therapeutic Goals: 1. Patient will identify two or more emotions that lead to a relapse for them 2. Patient will identify two emotions that result when they relapse 3. Patient will identify two emotions related to recovery 4. Patient will demonstrate ability to communicate their needs through discussion and/or role plays   Summary of Patient Progress:  Anthony Luna actively participated in today's group discussion on feelings around relapse and recovery.  Anthony Luna shared that he experiences feelings of distress prior to him having a relapse;  Feelings of anxiety when his is having a relapse and feelings of hopefulness and "prayful" when he is in recovery.   Therapeutic Modalities:   Cognitive Behavioral Therapy Solution-Focused Therapy Assertiveness Training Relapse Prevention Therapy   Alease FrameSonya S Keyera Hattabaugh, LCSW 11/27/2017 2:48 PM

## 2017-11-27 NOTE — Plan of Care (Signed)
Patient is calm and cooperative, safe in the unit, contract for safety of self and other, denies SI/HI and no signs of AVH, pace the floor on occasions, patient is supported therapeutically and acknowledged.  15 minute rounding in progress. Problem: Pain Managment: Goal: General experience of comfort will improve Outcome: Progressing   Problem: Safety: Goal: Ability to remain free from injury will improve Outcome: Progressing   Problem: Education: Goal: Ability to make informed decisions regarding treatment will improve Outcome: Progressing   Problem: Coping: Goal: Coping ability will improve Outcome: Progressing   Problem: Health Behavior/Discharge Planning: Goal: Identification of resources available to assist in meeting health care needs will improve Outcome: Progressing   Problem: Medication: Goal: Compliance with prescribed medication regimen will improve Outcome: Progressing   Problem: Self-Concept: Goal: Ability to disclose and discuss suicidal ideas will improve Outcome: Progressing Goal: Will verbalize positive feelings about self Outcome: Progressing   Problem: Health Behavior/Discharge Planning: Goal: Ability to identify changes in lifestyle to reduce recurrence of condition will improve Outcome: Progressing Goal: Identification of resources available to assist in meeting health care needs will improve Outcome: Progressing   Problem: Safety: Goal: Ability to remain free from injury will improve Outcome: Progressing   Problem: Education: Goal: Ability to state activities that reduce stress will improve Outcome: Progressing   Problem: Coping: Goal: Ability to identify and develop effective coping behavior will improve Outcome: Progressing   Problem: Self-Concept: Goal: Ability to identify factors that promote anxiety will improve Outcome: Progressing Goal: Level of anxiety will decrease Outcome: Progressing Goal: Ability to modify response to factors  that promote anxiety will improve Outcome: Progressing   Problem: Education: Goal: Utilization of techniques to improve thought processes will improve Outcome: Progressing Goal: Knowledge of the prescribed therapeutic regimen will improve Outcome: Progressing   Problem: Activity: Goal: Interest or engagement in leisure activities will improve Outcome: Progressing Goal: Imbalance in normal sleep/wake cycle will improve Outcome: Progressing   Problem: Coping: Goal: Coping ability will improve Outcome: Progressing Goal: Will verbalize feelings Outcome: Progressing   Problem: Health Behavior/Discharge Planning: Goal: Ability to make decisions will improve Outcome: Progressing Goal: Compliance with therapeutic regimen will improve Outcome: Progressing   Problem: Role Relationship: Goal: Will demonstrate positive changes in social behaviors and relationships Outcome: Progressing   Problem: Safety: Goal: Ability to disclose and discuss suicidal ideas will improve Outcome: Progressing Goal: Ability to identify and utilize support systems that promote safety will improve Outcome: Progressing   Problem: Self-Concept: Goal: Will verbalize positive feelings about self Outcome: Progressing Goal: Level of anxiety will decrease Outcome: Progressing

## 2017-11-27 NOTE — [facility] Group Notes (Signed)
[facility] Group Notes:  (Nursing/MHT/Case Management/Adjunct)  Date:  11/27/2017  Time:  9:31 PM  Type of Therapy:  Group Therapy  Participation Level:  Active  Participation Quality:  Appropriate and Sharing  Affect:  Appropriate  Cognitive:  Appropriate  Insight:  Appropriate  Engagement in Group:  Engaged  Modes of Intervention:  Discussion  Summary of Progress/Problems:  Anthony NeighborsKeith D Kenan Moodie 11/27/2017, 9:31 PM

## 2017-11-27 NOTE — Progress Notes (Signed)
Patient ID: Anthony Luna, male   DOB: 11/27/1985, 32 y.o.   MRN: [id number] CSW followed up with [facility] team leader, Levonne HubertBarbara Thompson.  Ms. Janee Mornhompson shared with CSW that she was unaware that pt had come to the ED on 11/25/17 and had been admitted into the [facility].  Ms. Janee Mornhompson shared that she had visited with pt on Monday, November 23, 2017 and pt had been fine and she had no concerns about him.  He did not share with her that he was feeling anxious or paranoid.  CSW informed Ms. Janee Mornhompson that pt had expressed to CSW and his psychiatrist that he wanted to go back to living in a group home.  Ms. Janee Mornhompson shared that pt had discussed this with her and she had found him a grouphome and talked with the owner.  She shared that she will discuss this with him again when he is discharged and if he says he wants to move she will work on getting him moved out of his apartment and into the grouphome.  She stated that she was in support of him going back to live in the grouphome as he has been "hanging" with the wrong people in his apartment complex and is being taken advantaged of.  Ms. Janee Mornhompson shared that pt could benefit from more structure and guidance.  Ms. Janee Mornhompson shared that pt is smoking cannabis on a daily basis and she has discussed with him several times that his use of cannabis can impact the effectiveness of his psychotropic medications.  His cannabis use could also be exacerbating his paranoia.   CSW was informed that there had been no changes in pt's medications. CSW will past this information along to pt's attending psychiatrist.

## 2017-11-27 NOTE — Progress Notes (Signed)
[facility] MD Progress Note  11/27/2017 12:31 PM Anthony Luna  MRN:  [id number] Subjective:  Pt was up more yesterday afternoon and briefly this morning. He still appears very sleepy despite no medication changes. He does admit that he only takes his medications about 3 times a week "when I go to [facility]." He is very vague with any symptoms and does not elaborate. He states that the voices are better but unable to give more information than that. He denies hearing babies crying anymore. Denies SI or HI. He has been very calm and pleasant on the unit.   Principal Problem: Schizoaffective disorder, depressive type (HCC) Diagnosis:   Patient Active Problem List   Diagnosis Date Noted  . Schizoaffective disorder, depressive type (HCC) [F25.1] 11/25/2017    Priority: High  . Cannabis use disorder, severe, dependence (HCC) [F12.20] 11/30/2014  . Schizoaffective disorder, bipolar type (HCC) [F25.0] 11/30/2014  . Tobacco use disorder [F17.200] 11/30/2014   Total Time spent with patient: 20 minutes  Past Psychiatric History: See h&P  Past Medical History:  Past Medical History:  Diagnosis Date  . Depression    Since moving into new apartment 1 year ago  . None to low serum cortisol response with adrenocorticotrophic hormone (ACTH) stimulation test   . Schizophrenia, paranoid type (HCC)    History reviewed. No pertinent surgical history. Family History: History reviewed. No pertinent family history. Family Psychiatric  History: See H&P Social History:  Social History   Substance and Sexual Activity  Alcohol Use Yes  . Alcohol/week: 0.0 oz   Comment: "Special occasions"     Social History   Substance and Sexual Activity  Drug Use Yes  . Frequency: 4.0 times per week  . Types: Marijuana   Comment: "2 blunts"    Social History   Socioeconomic History  . Marital status: Single    Spouse name: Not on file  . Number of children: Not on file  . Years of education: Not on file  .  Highest education level: Not on file  Occupational History  . Not on file  Social Needs  . Financial resource strain: Not on file  . Food insecurity:    Worry: Not on file    Inability: Not on file  . Transportation needs:    Medical: Not on file    Non-medical: Not on file  Tobacco Use  . Smoking status: Current Every Day Smoker    Packs/day: 0.50    Types: Cigarettes    Start date: 11/29/2002  . Smokeless tobacco: Former Neurosurgeon  . Tobacco comment: does not want to quit  Substance and Sexual Activity  . Alcohol use: Yes    Alcohol/week: 0.0 oz    Comment: "Special occasions"  . Drug use: Yes    Frequency: 4.0 times per week    Types: Marijuana    Comment: "2 blunts"  . Sexual activity: Not on file  Lifestyle  . Physical activity:    Days per week: Not on file    Minutes per session: Not on file  . Stress: Not on file  Relationships  . Social connections:    Talks on phone: Not on file    Gets together: Not on file    Attends religious service: Not on file    Active member of club or organization: Not on file    Attends meetings of clubs or organizations: Not on file    Relationship status: Not on file  Other Topics Concern  .  Not on file  Social History Narrative  . Not on file   Additional Social History:    Pain Medications: See PTA Prescriptions: See PTA Over the Counter: See PTA History of alcohol / drug use?: Yes(past) Longest period of sobriety (when/how long): unknown Negative Consequences of Use: Financial, Personal relationships, Work / Programmer, multimedia Withdrawal Symptoms: Other (Comment)(none) Name of Substance 1: marijuana 1 - Age of First Use: 17 1 - Amount (size/oz): varies 1 - Frequency: infrequent 1 - Duration: Unable to quantify 1 - Last Use / Amount: Unknown                  Sleep: Good  Appetite:  Fair  Current Medications: Current Facility-Administered Medications  Medication Dose Route Frequency Provider Last Rate Last Dose  .  acetaminophen (TYLENOL) tablet 650 mg  650 mg Oral Q6H PRN [name], [name], MD   650 mg at 11/26/17 1749  . alum & mag hydroxide-simeth (MAALOX/MYLANTA) 200-200-20 MG/5ML suspension 30 mL  30 mL Oral Q4H PRN [name], [name], MD      . amantadine (SYMMETREL) capsule 100 mg  100 mg Oral BID [name], [name], MD   100 mg at 11/27/17 0849  . cloZAPine (CLOZARIL) tablet 175 mg  175 mg Oral QHS [name], [name], MD      . hydrOXYzine (ATARAX/VISTARIL) tablet 50 mg  50 mg Oral TID PRN [name], [name], MD      . lithium carbonate (ESKALITH) CR tablet 450 mg  450 mg Oral Q12H [name], [name], MD   450 mg at 11/27/17 0849  . magnesium hydroxide (MILK OF MAGNESIA) suspension 30 mL  30 mL Oral Daily PRN [name], [name], MD   30 mL at 11/27/17 0849  . nicotine (NICODERM CQ - dosed in mg/24 hours) patch 21 mg  21 mg Transdermal Daily [name], [name], MD   21 mg at 11/27/17 0849  . propranolol (INDERAL) tablet 10 mg  10 mg Oral TID [name], [name], MD   10 mg at 11/27/17 0849  . traMADol (ULTRAM) tablet 50 mg  50 mg Oral Q8H PRN [name], MD   50 mg at 11/26/17 1221  . traZODone (DESYREL) tablet 100 mg  100 mg Oral QHS PRN [name], [name], MD   100 mg at 11/26/17 2117    Lab Results:  Results for orders placed or performed during the hospital encounter of 11/25/17 (from the past 48 hour(s))  Lithium level     Status: Abnormal   Collection Time: 11/26/17  7:07 AM  Result Value Ref Range   Lithium Lvl 0.38 (L) 0.60 - 1.20 mmol/L    Comment: Performed at [facility], [address] [place], [place] [address]  CBC with Differential/Platelet     Status: Abnormal   Collection Time: 11/26/17  7:07 AM  Result Value Ref Range   WBC 8.6 3.8 - 10.6 K/uL   RBC 5.20 4.40 - 5.90 MIL/uL   Hemoglobin 16.3 13.0 - 18.0 g/dL   HCT 19.1 47.8 - 29.5 %   MCV 93.2 80.0 - 100.0 fL   MCH 31.4 26.0 - 34.0 pg   MCHC 33.7 32.0 - 36.0 g/dL   RDW 62.1 30.8 - 65.7 %   Platelets 216 150 - 440 K/uL   Neutrophils  Relative % 56 %   Neutro Abs 4.8 1.4 - 6.5 K/uL   Lymphocytes Relative 25 %   Lymphs Abs 2.1 1.0 - 3.6 K/uL  Monocytes Relative 16 %   Monocytes Absolute 1.3 (H) 0.2 - 1.0 K/uL   Eosinophils Relative 3 %   Eosinophils Absolute 0.3 0 - 0.7 K/uL   Basophils Relative 0 %   Basophils Absolute 0.0 0 - 0.1 K/uL    Comment: Performed at [facility], [address] [place], [place] [address]    Blood Alcohol level:  Lab Results  Component Value Date   [facility] <10 11/24/2017   ETH <10 09/25/2017    Metabolic Disorder Labs: Lab Results  Component Value Date   HGBA1C 4.9 11/25/2017   MPG 93.93 11/25/2017   MPG 88.19 09/26/2017   Lab Results  Component Value Date   PROLACTIN 6.1 12/19/2014   PROLACTIN 1.3 (L) 11/30/2014   Lab Results  Component Value Date   CHOL 171 11/25/2017   TRIG 105 11/25/2017   HDL 57 11/25/2017   CHOLHDL 3.0 11/25/2017   VLDL 21 11/25/2017   LDLCALC 93 11/25/2017   LDLCALC 85 09/26/2017    Physical Findings: AIMS: Facial and Oral Movements Muscles of Facial Expression: None, normal Lips and Perioral Area: None, normal Jaw: None, normal Tongue: None, normal,Extremity Movements Upper (arms, wrists, hands, fingers): None, normal Lower (legs, knees, ankles, toes): None, normal, Trunk Movements Neck, shoulders, hips: None, normal, Overall Severity Severity of abnormal movements (highest score from questions above): None, normal Incapacitation due to abnormal movements: None, normal Patient's awareness of abnormal movements (rate only patient's report): No Awareness, Dental Status Current problems with teeth and/or dentures?: Yes(wisdom tooth extraction x 4) Does patient usually wear dentures?: No  CIWA:    COWS:     Musculoskeletal: Strength & Muscle Tone: within normal limits Gait & Station: normal Patient leans: N/A  Psychiatric Specialty Exam: Physical Exam  Nursing note and vitals reviewed.   Review of Systems  All other  systems reviewed and are negative.   Blood pressure (!) 128/94, pulse (!) 115, temperature 98.5 F (36.9 C), temperature source Oral, resp. rate 16, height 6' (1.829 m), weight 87.1 kg (192 lb), SpO2 100 %.Body mass index is 26.04 kg/m.  General Appearance: Casual  Eye Contact:  Fair  Speech:  Slow  Volume:  Decreased  Mood:  Depressed  Affect:  Constricted  Thought Process:  Coherent and Goal Directed  Orientation:  Full (Time, Place, and Person)  Thought Content:  Logical  Suicidal Thoughts:  No  Homicidal Thoughts:  No  Memory:  Immediate;   Fair  Judgement:  Fair  Insight:  Fair  Psychomotor Activity:  Decreased  Concentration:  Concentration: Poor  Recall:  [employer] of Knowledge:  Fair  Language:  Fair  Akathisia:  No      Assets:  Housing Resilience  ADL's:  Intact  Cognition:  WNL  Sleep:  Number of Hours: 8.15     Treatment Plan Summary: 32 yo male admitted due to worsening hallucinations. He appears sedated despite no changes in medications. He does admit that he only takes his medications about 3 times a week which is why he may be sedated with restarting them. He is very vague with any symptoms. States that the voices are better. He is flat and appears depressed.   Plan:  Schizoaffective disorder  -Decrease Clozapine to 175 mg qhs due to sedation -Continue Lithium 450 mg BID, Level 0.38 -Last Invega Trinza dose was on 09/28/17  Tremor/Akathisia -Symmetrel and propranolol  Recent tooth extraction -WBC normal today -prn tramadol  Dispo -CSW to contact CST team.  Will likely return home on discharge   Haskell RilingHolly R [name], MD 11/27/2017, 12:31 PM

## 2017-11-28 NOTE — Plan of Care (Signed)
Patient is coping well in the unit, seeing in milieu area socializing with peers, voice no concerns, contract for safety  denies any SI/HI no sings of AVH noted. 15 minute rounding is maintained.  Problem: Pain Managment: Goal: General experience of comfort will improve Outcome: Progressing   Problem: Safety: Goal: Ability to remain free from injury will improve Outcome: Progressing   Problem: Education: Goal: Ability to make informed decisions regarding treatment will improve Outcome: Progressing   Problem: Coping: Goal: Coping ability will improve Outcome: Progressing   Problem: Health Behavior/Discharge Planning: Goal: Identification of resources available to assist in meeting health care needs will improve Outcome: Progressing   Problem: Medication: Goal: Compliance with prescribed medication regimen will improve Outcome: Progressing   Problem: Self-Concept: Goal: Ability to disclose and discuss suicidal ideas will improve Outcome: Progressing Goal: Will verbalize positive feelings about self Outcome: Progressing   Problem: Health Behavior/Discharge Planning: Goal: Ability to identify changes in lifestyle to reduce recurrence of condition will improve Outcome: Progressing Goal: Identification of resources available to assist in meeting health care needs will improve Outcome: Progressing   Problem: Safety: Goal: Ability to remain free from injury will improve Outcome: Progressing   Problem: Education: Goal: Ability to state activities that reduce stress will improve Outcome: Progressing   Problem: Coping: Goal: Ability to identify and develop effective coping behavior will improve Outcome: Progressing   Problem: Self-Concept: Goal: Ability to identify factors that promote anxiety will improve Outcome: Progressing Goal: Level of anxiety will decrease Outcome: Progressing Goal: Ability to modify response to factors that promote anxiety will improve Outcome:  Progressing   Problem: Education: Goal: Utilization of techniques to improve thought processes will improve Outcome: Progressing Goal: Knowledge of the prescribed therapeutic regimen will improve Outcome: Progressing   Problem: Activity: Goal: Interest or engagement in leisure activities will improve Outcome: Progressing Goal: Imbalance in normal sleep/wake cycle will improve Outcome: Progressing   Problem: Coping: Goal: Coping ability will improve Outcome: Progressing Goal: Will verbalize feelings Outcome: Progressing   Problem: Health Behavior/Discharge Planning: Goal: Ability to make decisions will improve Outcome: Progressing Goal: Compliance with therapeutic regimen will improve Outcome: Progressing   Problem: Role Relationship: Goal: Will demonstrate positive changes in social behaviors and relationships Outcome: Progressing   Problem: Safety: Goal: Ability to disclose and discuss suicidal ideas will improve Outcome: Progressing Goal: Ability to identify and utilize support systems that promote safety will improve Outcome: Progressing   Problem: Self-Concept: Goal: Will verbalize positive feelings about self Outcome: Progressing Goal: Level of anxiety will decrease Outcome: Progressing

## 2017-11-28 NOTE — Progress Notes (Signed)
[facility] MD Progress Note  11/28/2017 4:58 PM Anthony Luna  MRN:  [id number] Subjective:    Pt not drowsy, admits not taking meds for 2 days prior to admission, reports AH is better. Denies Si/HI.  Principal Problem: Schizoaffective disorder, depressive type (HCC) Diagnosis:   Patient Active Problem List   Diagnosis Date Noted  . Schizoaffective disorder, depressive type (HCC) [F25.1] 11/25/2017  . Cannabis use disorder, severe, dependence (HCC) [F12.20] 11/30/2014  . Schizoaffective disorder, bipolar type (HCC) [F25.0] 11/30/2014  . Tobacco use disorder [F17.200] 11/30/2014   Total Time spent with patient: 25 min  Past Psychiatric History: See h&P  Past Medical History:  Past Medical History:  Diagnosis Date  . Depression    Since moving into new apartment 1 year ago  . None to low serum cortisol response with adrenocorticotrophic hormone (ACTH) stimulation test   . Schizophrenia, paranoid type (HCC)    History reviewed. No pertinent surgical history. Family History: History reviewed. No pertinent family history. Family Psychiatric  History: See H&P Social History:  Social History   Substance and Sexual Activity  Alcohol Use Yes  . Alcohol/week: 0.0 oz   Comment: "Special occasions"     Social History   Substance and Sexual Activity  Drug Use Yes  . Frequency: 4.0 times per week  . Types: Marijuana   Comment: "2 blunts"    Social History   Socioeconomic History  . Marital status: Single    Spouse name: Not on file  . Number of children: Not on file  . Years of education: Not on file  . Highest education level: Not on file  Occupational History  . Not on file  Social Needs  . Financial resource strain: Not on file  . Food insecurity:    Worry: Not on file    Inability: Not on file  . Transportation needs:    Medical: Not on file    Non-medical: Not on file  Tobacco Use  . Smoking status: Current Every Day Smoker    Packs/day: 0.50    Types:  Cigarettes    Start date: 11/29/2002  . Smokeless tobacco: Former Neurosurgeon  . Tobacco comment: does not want to quit  Substance and Sexual Activity  . Alcohol use: Yes    Alcohol/week: 0.0 oz    Comment: "Special occasions"  . Drug use: Yes    Frequency: 4.0 times per week    Types: Marijuana    Comment: "2 blunts"  . Sexual activity: Not on file  Lifestyle  . Physical activity:    Days per week: Not on file    Minutes per session: Not on file  . Stress: Not on file  Relationships  . Social connections:    Talks on phone: Not on file    Gets together: Not on file    Attends religious service: Not on file    Active member of club or organization: Not on file    Attends meetings of clubs or organizations: Not on file    Relationship status: Not on file  Other Topics Concern  . Not on file  Social History Narrative  . Not on file   Additional Social History:    Pain Medications: See PTA Prescriptions: See PTA Over the Counter: See PTA History of alcohol / drug use?: Yes(past) Longest period of sobriety (when/how long): unknown Negative Consequences of Use: Financial, Personal relationships, Work / School Withdrawal Symptoms: Other (Comment)(none) Name of Substance 1: marijuana 1 - Age of  First Use: 17 1 - Amount (size/oz): varies 1 - Frequency: infrequent 1 - Duration: Unable to quantify 1 - Last Use / Amount: Unknown                  Sleep: Good  Appetite:  Fair  Current Medications: Current Facility-Administered Medications  Medication Dose Route Frequency Provider Last Rate Last Dose  . acetaminophen (TYLENOL) tablet 650 mg  650 mg Oral Q6H PRN [name], [name], MD   650 mg at 11/27/17 1342  . alum & mag hydroxide-simeth (MAALOX/MYLANTA) 200-200-20 MG/5ML suspension 30 mL  30 mL Oral Q4H PRN [name], [name], MD      . amantadine (SYMMETREL) capsule 100 mg  100 mg Oral BID [name], [name], MD   100 mg at 11/28/17 0901  . cloZAPine (CLOZARIL) tablet 175 mg   175 mg Oral QHS [name], [name], MD   175 mg at 11/27/17 2111  . hydrOXYzine (ATARAX/VISTARIL) tablet 50 mg  50 mg Oral TID PRN [name], [name], MD      . lithium carbonate (ESKALITH) CR tablet 450 mg  450 mg Oral Q12H [name], [name], MD   450 mg at 11/28/17 0901  . magnesium hydroxide (MILK OF MAGNESIA) suspension 30 mL  30 mL Oral Daily PRN [name], [name], MD   30 mL at 11/27/17 0849  . nicotine (NICODERM CQ - dosed in mg/24 hours) patch 21 mg  21 mg Transdermal Daily [name], [name], MD   21 mg at 11/28/17 0901  . propranolol (INDERAL) tablet 10 mg  10 mg Oral TID [name], [name], MD   10 mg at 11/28/17 1228  . traMADol (ULTRAM) tablet 50 mg  50 mg Oral Q8H PRN Haskell RilingMcNew, [name], MD   50 mg at 11/26/17 1221  . traZODone (DESYREL) tablet 100 mg  100 mg Oral QHS PRN [name], [name], MD   100 mg at 11/26/17 2117    Lab Results:  No results found for this or any previous visit (from the past 48 hour(s)).  Blood Alcohol level:  Lab Results  Component Value Date   ETH <10 11/24/2017   ETH <10 09/25/2017    Metabolic Disorder Labs: Lab Results  Component Value Date   HGBA1C 4.9 11/25/2017   MPG 93.93 11/25/2017   MPG 88.19 09/26/2017   Lab Results  Component Value Date   PROLACTIN 6.1 12/19/2014   PROLACTIN 1.3 (L) 11/30/2014   Lab Results  Component Value Date   CHOL 171 11/25/2017   TRIG 105 11/25/2017   HDL 57 11/25/2017   CHOLHDL 3.0 11/25/2017   VLDL 21 11/25/2017   LDLCALC 93 11/25/2017   LDLCALC 85 09/26/2017    Physical Findings: AIMS: Facial and Oral Movements Muscles of Facial Expression: None, normal Lips and Perioral Area: None, normal Jaw: None, normal Tongue: None, normal,Extremity Movements Upper (arms, wrists, hands, fingers): None, normal Lower (legs, knees, ankles, toes): None, normal, Trunk Movements Neck, shoulders, hips: None, normal, Overall Severity Severity of abnormal movements (highest score from questions above): None, normal Incapacitation  due to abnormal movements: None, normal Patient's awareness of abnormal movements (rate only patient's report): No Awareness, Dental Status Current problems with teeth and/or dentures?: Yes(wisdom tooth extraction x 4) Does patient usually wear dentures?: No  CIWA:    COWS:     Musculoskeletal: Strength & Muscle Tone: within normal limits Gait & Station: normal Patient leans: N/A  Psychiatric Specialty Exam: Physical Exam  Nursing note and  vitals reviewed.   Review of Systems  All other systems reviewed and are negative.   Blood pressure 120/81, pulse 87, temperature 98.4 F (36.9 C), temperature source Oral, resp. rate 20, height 6' (1.829 m), weight 87.1 kg (192 lb), SpO2 100 %.Body mass index is 26.04 kg/m.  General Appearance: Casual  Eye Contact:  Fair  Speech:  Slow  Volume:  Decreased  Mood:  Depressed  Affect:  blunted  Thought Process:  Coherent and Goal Directed  Orientation:  Full (Time, Place, and Person)  Thought Content:  AH  Suicidal Thoughts:  No  Homicidal Thoughts:  No  Memory:  Immediate;   Fair  Judgement:  Fair  Insight:  Fair  Psychomotor Activity:  Decreased  Concentration:  Concentration: Poor  Recall:  [employer] of Knowledge:  Fair  Language:  Fair  Akathisia:  No      Assets:  Housing Resilience  ADL's:  Intact  Cognition:  WNL  Sleep:  Number of Hours: 8.15     Treatment Plan Summary: 32 yo male admitted due to worsening hallucinations. Pt psychotic, alert today.    Plan:  Schizoaffective disorder  -cont  Clozapine to 175 mg qhs  -Continue Lithium 450 mg BID, Level 0.38 -Last Invega Trinza dose was on 09/28/17  Tremor/Akathisia -Symmetrel and propranolol   Beverly Sessions, MD 11/28/2017, 4:58 PMPatient ID: Anthony Luna, male   DOB: April 12, 1986, 32 y.o.   MRN: [id number]

## 2017-11-28 NOTE — Plan of Care (Signed)
Affect constricted. Forwards little information.  Visible in the milieu.  Medication and group compliant.  Paces halls.  Appetite pair.  Denies SI/HI/AVH.  Support offered.  Safety maintained on the unit. Problem: Safety: Goal: Ability to remain free from injury will improve Outcome: Progressing   Problem: Education: Goal: Ability to make informed decisions regarding treatment will improve Outcome: Progressing   Problem: Coping: Goal: Coping ability will improve Outcome: Progressing   Problem: Health Behavior/Discharge Planning: Goal: Identification of resources available to assist in meeting health care needs will improve Outcome: Progressing   Problem: Medication: Goal: Compliance with prescribed medication regimen will improve Outcome: Progressing   Problem: Self-Concept: Goal: Ability to disclose and discuss suicidal ideas will improve Outcome: Progressing Goal: Will verbalize positive feelings about self Outcome: Progressing   Problem: Activity: Goal: Interest or engagement in leisure activities will improve Outcome: Progressing   Problem: Coping: Goal: Coping ability will improve Outcome: Progressing   Problem: Health Behavior/Discharge Planning: Goal: Ability to make decisions will improve Outcome: Progressing Goal: Compliance with therapeutic regimen will improve Outcome: Progressing   Problem: Safety: Goal: Ability to disclose and discuss suicidal ideas will improve Outcome: Progressing   Problem: Self-Concept: Goal: Level of anxiety will decrease Outcome: Progressing   Problem: Pain Managment: Goal: General experience of comfort will improve Outcome: Not Applicable

## 2017-11-28 NOTE — [facility] Group Notes (Signed)
LCSW Group Therapy Note  11/28/2017 1:15pm  Type of Therapy and Topic:  Group Therapy:  Fears and Unhealthy Coping Skills  Participation Level:  Minimal   Description of Group:  The focus of this group was to discuss some of the prevalent fears that patients experience, and to identify the commonalities among group members.  An exercise was used to initiate the discussion, followed by writing on the white board a group-generated list of unhealthy coping and healthy coping techniques to deal with each fear.    Therapeutic Goals: 1. Patient will identify and describe 3 fears they experience 2. Patient will identify one positive coping strategy for each fear they experience 3. Patient will respond empathically to peers statements regarding fears they experience  Summary of Patient Progress:  The patient reported he feels "good, good sleep and good food." Patient was unable to identify specific problems or fears he has. He stated "I am dealing with problems in the past that I haven't dealt with before."     Therapeutic Modalities Cognitive Behavioral Therapy Motivational Interviewing  Anthony Luna  CUEBAS-COLON, LCSW 11/28/2017 4:39 PM

## 2017-11-29 NOTE — [facility] Group Notes (Signed)
LCSW Group Therapy Note 11/29/2017 1:15pm  Type of Therapy and Topic: Group Therapy: Feelings Around Returning Home & Establishing a Supportive Framework and Supporting Oneself When Supports Not Available  Participation Level: Did Not Attend  Description of Group:  Patients first processed thoughts and feelings about upcoming discharge. These included fears of upcoming changes, lack of change, new living environments, judgements and expectations from others and overall stigma of mental health issues. The group then discussed the definition of a supportive framework, what that looks and feels like, and how do to discern it from an unhealthy non-supportive network. The group identified different types of supports as well as what to do when your family/friends are less than helpful or unavailable  Therapeutic Goals  1. Patient will identify one healthy supportive network that they can use at discharge. 2. Patient will identify one factor of a supportive framework and how to tell it from an unhealthy network. 3. Patient able to identify one coping skill to use when they do not have positive supports from others. 4. Patient will demonstrate ability to communicate their needs through discussion and/or role plays.  Summary of Patient Progress:  Pt was invited to attend group but chose not to attend. CSW will continue to encourage pt to attend group throughout their admission.   Therapeutic Modalities Cognitive Behavioral Therapy Motivational Interviewing   Johnnye SimaNNIA  CUEBAS-COLON, LCSW 11/29/2017 12:35 PM

## 2017-11-29 NOTE — Progress Notes (Signed)
[facility] MD Progress Note  11/29/2017 1:26 PM Anthony Luna  MRN:  [id number] Subjective:   I'm ok" Pt reportedly talking to self at times, reports AH is better, admits paranoia. Denies SI/HI.  Principal Problem: Schizoaffective disorder, depressive type (HCC) Diagnosis:   Patient Active Problem List   Diagnosis Date Noted  . Schizoaffective disorder, depressive type (HCC) [F25.1] 11/25/2017  . Cannabis use disorder, severe, dependence (HCC) [F12.20] 11/30/2014  . Schizoaffective disorder, bipolar type (HCC) [F25.0] 11/30/2014  . Tobacco use disorder [F17.200] 11/30/2014   Total Time spent with patient: 25 min  Past Psychiatric History: See h&P  Past Medical History:  Past Medical History:  Diagnosis Date  . Depression    Since moving into new apartment 1 year ago  . None to low serum cortisol response with adrenocorticotrophic hormone (ACTH) stimulation test   . Schizophrenia, paranoid type (HCC)    History reviewed. No pertinent surgical history. Family History: History reviewed. No pertinent family history. Family Psychiatric  History: See H&P Social History:  Social History   Substance and Sexual Activity  Alcohol Use Yes  . Alcohol/week: 0.0 oz   Comment: "Special occasions"     Social History   Substance and Sexual Activity  Drug Use Yes  . Frequency: 4.0 times per week  . Types: Marijuana   Comment: "2 blunts"    Social History   Socioeconomic History  . Marital status: Single    Spouse name: Not on file  . Number of children: Not on file  . Years of education: Not on file  . Highest education level: Not on file  Occupational History  . Not on file  Social Needs  . Financial resource strain: Not on file  . Food insecurity:    Worry: Not on file    Inability: Not on file  . Transportation needs:    Medical: Not on file    Non-medical: Not on file  Tobacco Use  . Smoking status: Current Every Day Smoker    Packs/day: 0.50    Types: Cigarettes     Start date: 11/29/2002  . Smokeless tobacco: Former Neurosurgeon  . Tobacco comment: does not want to quit  Substance and Sexual Activity  . Alcohol use: Yes    Alcohol/week: 0.0 oz    Comment: "Special occasions"  . Drug use: Yes    Frequency: 4.0 times per week    Types: Marijuana    Comment: "2 blunts"  . Sexual activity: Not on file  Lifestyle  . Physical activity:    Days per week: Not on file    Minutes per session: Not on file  . Stress: Not on file  Relationships  . Social connections:    Talks on phone: Not on file    Gets together: Not on file    Attends religious service: Not on file    Active member of club or organization: Not on file    Attends meetings of clubs or organizations: Not on file    Relationship status: Not on file  Other Topics Concern  . Not on file  Social History Narrative  . Not on file   Additional Social History:    Pain Medications: See PTA Prescriptions: See PTA Over the Counter: See PTA History of alcohol / drug use?: Yes(past) Longest period of sobriety (when/how long): unknown Negative Consequences of Use: Financial, Personal relationships, Work / School Withdrawal Symptoms: Other (Comment)(none) Name of Substance 1: marijuana 1 - Age of First Use: 17  1 - Amount (size/oz): varies 1 - Frequency: infrequent 1 - Duration: Unable to quantify 1 - Last Use / Amount: Unknown                  Sleep: Good  Appetite:  Fair  Current Medications: Current Facility-Administered Medications  Medication Dose Route Frequency Provider Last Rate Last Dose  . acetaminophen (TYLENOL) tablet 650 mg  650 mg Oral Q6H PRN [name], [name], MD   650 mg at 11/27/17 1342  . alum & mag hydroxide-simeth (MAALOX/MYLANTA) 200-200-20 MG/5ML suspension 30 mL  30 mL Oral Q4H PRN [name], [name], MD      . amantadine (SYMMETREL) capsule 100 mg  100 mg Oral BID [name], [name], MD   100 mg at 11/29/17 1610  . cloZAPine (CLOZARIL) tablet 175 mg  175 mg Oral  QHS [name], [name], MD   175 mg at 11/28/17 2241  . hydrOXYzine (ATARAX/VISTARIL) tablet 50 mg  50 mg Oral TID PRN [name], [name], MD      . lithium carbonate (ESKALITH) CR tablet 450 mg  450 mg Oral Q12H [name], [name], MD   450 mg at 11/29/17 9604  . magnesium hydroxide (MILK OF MAGNESIA) suspension 30 mL  30 mL Oral Daily PRN [name], [name], MD   30 mL at 11/27/17 0849  . nicotine (NICODERM CQ - dosed in mg/24 hours) patch 21 mg  21 mg Transdermal Daily [name], [name], MD   21 mg at 11/29/17 5409  . propranolol (INDERAL) tablet 10 mg  10 mg Oral TID [name], [name], MD   10 mg at 11/29/17 1202  . traMADol (ULTRAM) tablet 50 mg  50 mg Oral Q8H PRN [name], MD   50 mg at 11/26/17 1221  . traZODone (DESYREL) tablet 100 mg  100 mg Oral QHS PRN [name], [name], MD   100 mg at 11/28/17 2241    Lab Results:  No results found for this or any previous visit (from the past 48 hour(s)).  Blood Alcohol level:  Lab Results  Component Value Date   ETH <10 11/24/2017   ETH <10 09/25/2017    Metabolic Disorder Labs: Lab Results  Component Value Date   HGBA1C 4.9 11/25/2017   MPG 93.93 11/25/2017   MPG 88.19 09/26/2017   Lab Results  Component Value Date   PROLACTIN 6.1 12/19/2014   PROLACTIN 1.3 (L) 11/30/2014   Lab Results  Component Value Date   CHOL 171 11/25/2017   TRIG 105 11/25/2017   HDL 57 11/25/2017   CHOLHDL 3.0 11/25/2017   VLDL 21 11/25/2017   LDLCALC 93 11/25/2017   LDLCALC 85 09/26/2017    Physical Findings: AIMS: Facial and Oral Movements Muscles of Facial Expression: None, normal Lips and Perioral Area: None, normal Jaw: None, normal Tongue: None, normal,Extremity Movements Upper (arms, wrists, hands, fingers): None, normal Lower (legs, knees, ankles, toes): None, normal, Trunk Movements Neck, shoulders, hips: None, normal, Overall Severity Severity of abnormal movements (highest score from questions above): None, normal Incapacitation due to  abnormal movements: None, normal Patient's awareness of abnormal movements (rate only patient's report): No Awareness, Dental Status Current problems with teeth and/or dentures?: Yes(wisdom tooth extraction x 4) Does patient usually wear dentures?: No  CIWA:    COWS:     Musculoskeletal: Strength & Muscle Tone: within normal limits Gait & Station: normal Patient leans: N/A  Psychiatric Specialty Exam: Physical Exam  Nursing note and vitals reviewed.  Review of Systems  All other systems reviewed and are negative.   Blood pressure 124/88, pulse 87, temperature 98.8 F (37.1 C), temperature source Oral, resp. rate 18, height 6' (1.829 m), weight 87.1 kg (192 lb), SpO2 98 %.Body mass index is 26.04 kg/m.  General Appearance: Casual  Eye Contact:  Fair  Speech:  Slow  Volume:  Decreased  Mood:  Depressed  Affect:  blunted  Thought Process:  Coherent and Goal Directed  Orientation:  Full (Time, Place, and Person)  Thought Content:  Paranoid, AH better.  Suicidal Thoughts:  No  Homicidal Thoughts:  No  Memory:  Immediate;   Fair  Judgement:  Fair  Insight:  Fair  Psychomotor Activity:  Decreased  Concentration:  Concentration: Poor  Recall:  [employer]  Fund of Knowledge:  Fair  Language:  Fair  Akathisia:  No      Assets:  Housing Resilience  ADL's:  Intact  Cognition:  WNL  Sleep:  Number of Hours: 7.45     Treatment Plan Summary: 32 yo male admitted due to worsening psychosis. Pt still psychotic. Plan:  Schizoaffective disorder  -cont  Clozapine to 175 mg qhs  -Continue Lithium 450 mg BID, Level 0.38 -Last Invega Trinza dose was on 09/28/17  Tremor/Akathisia -Symmetrel and propranolol   Beverly SessionsJagannath Caulder Wehner, MD 11/29/2017, 1:26 PMPatient ID: Anthony Luna, male   DOB: 01/09/1986, 32 y.o.   MRN: [id number] Patient ID: Anthony Luna, male   DOB: 09/29/1985, 32 y.o.   MRN: [id number]

## 2017-11-29 NOTE — Plan of Care (Signed)
Denies SI/HI/AVH.  Affect flat, smiles with engagement and easily engaged.  More talkative.  Visible in the milieu.  Noted playing cards with peers. Paces halls at times. Problem: Safety: Goal: Ability to remain free from injury will improve Outcome: Progressing   Problem: Education: Goal: Ability to make informed decisions regarding treatment will improve Outcome: Progressing   Problem: Coping: Goal: Coping ability will improve Outcome: Progressing   Problem: Health Behavior/Discharge Planning: Goal: Identification of resources available to assist in meeting health care needs will improve Outcome: Progressing   Problem: Medication: Goal: Compliance with prescribed medication regimen will improve Outcome: Progressing   Problem: Self-Concept: Goal: Ability to disclose and discuss suicidal ideas will improve Outcome: Progressing Goal: Will verbalize positive feelings about self Outcome: Progressing   Problem: Health Behavior/Discharge Planning: Goal: Ability to identify changes in lifestyle to reduce recurrence of condition will improve Outcome: Progressing Goal: Identification of resources available to assist in meeting health care needs will improve Outcome: Progressing   Problem: Safety: Goal: Ability to remain free from injury will improve Outcome: Progressing   Problem: Education: Goal: Ability to state activities that reduce stress will improve Outcome: Progressing   Problem: Coping: Goal: Ability to identify and develop effective coping behavior will improve Outcome: Progressing   Problem: Self-Concept: Goal: Ability to identify factors that promote anxiety will improve Outcome: Progressing Goal: Level of anxiety will decrease Outcome: Progressing Goal: Ability to modify response to factors that promote anxiety will improve Outcome: Progressing   Problem: Education: Goal: Utilization of techniques to improve thought processes will improve Outcome:  Progressing Goal: Knowledge of the prescribed therapeutic regimen will improve Outcome: Progressing   Problem: Activity: Goal: Interest or engagement in leisure activities will improve Outcome: Progressing Goal: Imbalance in normal sleep/wake cycle will improve Outcome: Progressing   Problem: Coping: Goal: Coping ability will improve Outcome: Progressing Goal: Will verbalize feelings Outcome: Progressing   Problem: Health Behavior/Discharge Planning: Goal: Ability to make decisions will improve Outcome: Progressing Goal: Compliance with therapeutic regimen will improve Outcome: Progressing   Problem: Role Relationship: Goal: Will demonstrate positive changes in social behaviors and relationships Outcome: Progressing   Problem: Safety: Goal: Ability to disclose and discuss suicidal ideas will improve Outcome: Progressing Goal: Ability to identify and utilize support systems that promote safety will improve Outcome: Progressing   Problem: Self-Concept: Goal: Will verbalize positive feelings about self Outcome: Progressing Goal: Level of anxiety will decrease Outcome: Progressing   Support offered.  Safety maintained.

## 2017-11-30 NOTE — Plan of Care (Signed)
Patient is improving and doing well in the unit denies hearing voices, contract for safety and denies any SI/HI, took his medicines, went to bed afterwards, no complain voiced by patient, 15 minute safety rounds is maintained Problem: Safety: Goal: Ability to remain free from injury will improve Outcome: Progressing   Problem: Education: Goal: Ability to make informed decisions regarding treatment will improve Outcome: Progressing   Problem: Coping: Goal: Coping ability will improve Outcome: Progressing   Problem: Health Behavior/Discharge Planning: Goal: Identification of resources available to assist in meeting health care needs will improve Outcome: Progressing   Problem: Medication: Goal: Compliance with prescribed medication regimen will improve Outcome: Progressing   Problem: Self-Concept: Goal: Ability to disclose and discuss suicidal ideas will improve Outcome: Progressing Goal: Will verbalize positive feelings about self Outcome: Progressing   Problem: Health Behavior/Discharge Planning: Goal: Ability to identify changes in lifestyle to reduce recurrence of condition will improve Outcome: Progressing Goal: Identification of resources available to assist in meeting health care needs will improve Outcome: Progressing   Problem: Safety: Goal: Ability to remain free from injury will improve Outcome: Progressing   Problem: Education: Goal: Ability to state activities that reduce stress will improve Outcome: Progressing   Problem: Coping: Goal: Ability to identify and develop effective coping behavior will improve Outcome: Progressing   Problem: Self-Concept: Goal: Ability to identify factors that promote anxiety will improve Outcome: Progressing Goal: Level of anxiety will decrease Outcome: Progressing Goal: Ability to modify response to factors that promote anxiety will improve Outcome: Progressing   Problem: Education: Goal: Utilization of techniques to  improve thought processes will improve Outcome: Progressing Goal: Knowledge of the prescribed therapeutic regimen will improve Outcome: Progressing   Problem: Activity: Goal: Interest or engagement in leisure activities will improve Outcome: Progressing Goal: Imbalance in normal sleep/wake cycle will improve Outcome: Progressing   Problem: Coping: Goal: Coping ability will improve Outcome: Progressing Goal: Will verbalize feelings Outcome: Progressing   Problem: Health Behavior/Discharge Planning: Goal: Ability to make decisions will improve Outcome: Progressing Goal: Compliance with therapeutic regimen will improve Outcome: Progressing   Problem: Role Relationship: Goal: Will demonstrate positive changes in social behaviors and relationships Outcome: Progressing   Problem: Safety: Goal: Ability to disclose and discuss suicidal ideas will improve Outcome: Progressing Goal: Ability to identify and utilize support systems that promote safety will improve Outcome: Progressing   Problem: Self-Concept: Goal: Will verbalize positive feelings about self Outcome: Progressing Goal: Level of anxiety will decrease Outcome: Progressing

## 2017-11-30 NOTE — Progress Notes (Signed)
Recreation Therapy Notes  Date: 11/30/2017  Time: 9:30 am   Location: Craft [facility]   Behavioral response: N/A   Intervention Topic: Happiness  Discussion/Intervention: Patient did not attend group.   Clinical Observations/Feedback:  Patient did not attend group.   [name] LRT/CTRS        Anthony Luna 11/30/2017 10:28 AM 

## 2017-11-30 NOTE — Progress Notes (Signed)
  Mr. Anthony Luna willingly attended group on tonight. He participated with the group activities with his peers. He was very respectful to staff and others during group.  Mr. Anthony Luna pulled his sheet out of the bag and read how he was here at behavioral medicine to work on his attitude.  He is expecting to improve himself as a person when he is discharge.   Manolito Jurewicz [place] BSW, MHT 11/30/2017

## 2017-11-30 NOTE — Tx Team (Signed)
Interdisciplinary Treatment and Diagnostic Plan Update  11/30/2017 Time of Session: 11:25 AM Anthony BarthelBrandon Wayne Luna MRN: [id number]  Principal Diagnosis: Schizoaffective disorder, depressive type [facility](HCC)  Secondary Diagnoses: Principal Problem:   Schizoaffective disorder, depressive type (HCC)   Current Medications:  Current Facility-Administered Medications  Medication Dose Route Frequency Provider Last Rate Last Dose  . acetaminophen (TYLENOL) tablet 650 mg  650 mg Oral Q6H PRN [name], [name], MD   650 mg at 11/30/17 0841  . alum & mag hydroxide-simeth (MAALOX/MYLANTA) 200-200-20 MG/5ML suspension 30 mL  30 mL Oral Q4H PRN [name], [name], MD      . amantadine (SYMMETREL) capsule 100 mg  100 mg Oral BID [name], [name], MD   100 mg at 11/30/17 0839  . cloZAPine (CLOZARIL) tablet 175 mg  175 mg Oral QHS [name], [name], MD   175 mg at 11/29/17 2214  . hydrOXYzine (ATARAX/VISTARIL) tablet 50 mg  50 mg Oral TID PRN [name], [name], MD      . lithium carbonate (ESKALITH) CR tablet 450 mg  450 mg Oral Q12H [name], [name], MD   450 mg at 11/30/17 0839  . magnesium hydroxide (MILK OF MAGNESIA) suspension 30 mL  30 mL Oral Daily PRN [name], [name], MD   30 mL at 11/27/17 0849  . nicotine (NICODERM CQ - dosed in mg/24 hours) patch 21 mg  21 mg Transdermal Daily [name], [name], MD   21 mg at 11/30/17 [id number]  . propranolol (INDERAL) tablet 10 mg  10 mg Oral TID [name], [name], MD   10 mg at 11/30/17 1137  . traMADol (ULTRAM) tablet 50 mg  50 mg Oral Q8H PRN Haskell RilingMcNew, [name], MD   50 mg at 11/29/17 2019  . traZODone (DESYREL) tablet 100 mg  100 mg Oral QHS PRN [name], [name], MD   100 mg at 11/29/17 2214   PTA Medications: Medications Prior to Admission  Medication Sig Dispense Refill Last Dose  . amantadine (SYMMETREL) 100 MG capsule Take 1 capsule (100 mg total) by mouth 2 (two) times daily. 60 capsule 0   . clozapine (CLOZARIL) 50 MG tablet Take 3.5 tablets (175 mg total) by mouth at bedtime.  105 tablet 0   . lithium carbonate (ESKALITH) 450 MG CR tablet Take 1 tablet (450 mg total) by mouth 2 (two) times daily. 60 tablet 0   . Paliperidone Palmitate (INVEGA TRINZA) 819 MG/2.625ML SUSP Inject 819 mg into the muscle.   Past Month at Unknown time  . propranolol (INDERAL) 10 MG tablet Take 1 tablet (10 mg total) by mouth 3 (three) times daily. 90 tablet 0     Patient Stressors: Financial difficulties Occupational concerns Substance abuse  Patient Strengths: Capable of independent living Motivation for treatment/growth Physical Health Supportive family/friends  Treatment Modalities: Medication Management, Group therapy, Case management,  1 to 1 session with clinician, Psychoeducation, Recreational therapy.   Physician Treatment Plan for Primary Diagnosis: Schizoaffective disorder, depressive type (HCC) Long Term Goal(s): Improvement in symptoms so as ready for discharge   Short Term Goals: Ability to demonstrate self-control will improve  Medication Management: Evaluate patient's response, side effects, and tolerance of medication regimen.  Therapeutic Interventions: 1 to 1 sessions, Unit Group sessions and Medication administration.  Evaluation of Outcomes: Progressing  Physician Treatment Plan for Secondary Diagnosis: Principal Problem:   Schizoaffective disorder, depressive type (HCC)  Long Term Goal(s): Improvement in symptoms so as ready for discharge   Short Term Goals: Ability to  demonstrate self-control will improve     Medication Management: Evaluate patient's response, side effects, and tolerance of medication regimen.  Therapeutic Interventions: 1 to 1 sessions, Unit Group sessions and Medication administration.  Evaluation of Outcomes: Progressing   RN Treatment Plan for Primary Diagnosis: Schizoaffective disorder, depressive type (HCC) Long Term Goal(s): Knowledge of disease and therapeutic regimen to maintain health will improve  Short Term Goals:  Ability to verbalize feelings will improve, Ability to identify and develop effective coping behaviors will improve and Compliance with prescribed medications will improve  Medication Management: RN will administer medications as ordered by provider, will assess and evaluate patient's response and provide education to patient for prescribed medication. RN will report any adverse and/or side effects to prescribing provider.  Therapeutic Interventions: 1 on 1 counseling sessions, Psychoeducation, Medication administration, Evaluate responses to treatment, Monitor vital signs and CBGs as ordered, Perform/monitor CIWA, COWS, AIMS and Fall Risk screenings as ordered, Perform wound care treatments as ordered.  Evaluation of Outcomes: Progressing   LCSW Treatment Plan for Primary Diagnosis: Schizoaffective disorder, depressive type (HCC) Long Term Goal(s): Safe transition to appropriate next level of care at discharge, Engage patient in therapeutic group addressing interpersonal concerns.  Short Term Goals: Engage patient in aftercare planning with referrals and resources and Increase skills for wellness and recovery  Therapeutic Interventions: Assess for all discharge needs, 1 to 1 time with Social worker, Explore available resources and support systems, Assess for adequacy in community support network, Educate family and significant other(s) on suicide prevention, Complete Psychosocial Assessment, Interpersonal group therapy.  Evaluation of Outcomes: Progressing   Progress in Treatment: Attending groups: Yes. Participating in groups: No. Taking medication as prescribed: Yes. Toleration medication: Yes. Family/Significant other contact made: No, will contact:  CSW received consent to contact pt's sister, Anthony Luna. Patient understands diagnosis: Yes. Discussing patient identified problems/goals with staff: Yes. Medical problems stabilized or resolved: Yes. Denies suicidal/homicidal  ideation: Yes. Issues/concerns per patient self-inventory: No. Other: n/a  New problem(s) identified: No, Describe:  No new problems identified  New Short Term/Long Term Goal(s):   Patient Goals:  "To stay away from drugs and get on the right medications for my anxiety and hallucinations"  Discharge Plan or Barriers: Tentative discharge plan is for pt to return to his apartment and resume services with his CST with [facility].  Reason for Continuation of Hospitalization: Anxiety Hallucinations Medication stabilization  Estimated Length of Stay: 2 days  Recreational Therapy: Patient Stressors: N/A Patient Goal: Patient will identify 3 triggers to anxiety within 5 recreation therapy group sessions  Attendees: Patient: 11/30/2017 11:43 AM  Physician: Corinna Gab, MD 11/30/2017 11:43 AM  Nursing: Hulan Amato, RN 11/30/2017 11:43 AM  RN Care Manager: 11/30/2017 11:43 AM  Social Worker:  11/30/2017 11:43 AM  Recreational Therapist: Garret Reddish, LRT 11/30/2017 11:43 AM  Other: Heidi Dach, LCSW 11/30/2017 11:43 AM  Other: Johny Shears, LCSWA 11/30/2017 11:43 AM  Other:John Sabino Gasser, Chaplain 11/30/2017 11:43 AM    Scribe for Treatment Team: Heidi Dach, LCSW 11/30/2017 11:43 AM

## 2017-11-30 NOTE — [facility] Group Notes (Signed)
LCSW Group Note  11/30/2017  Time: 1PM   Type of Therapy and Topic:  Group Therapy:  Overcoming Obstacles   Participation Level:  Active   Description of Group:   In this group patients will be encouraged to explore what they see as obstacles to their own wellness and recovery. They will be guided to discuss their thoughts, feelings, and behaviors related to these obstacles. The group will process together ways to cope with barriers, with attention given to specific choices patients can make. Each patient will be challenged to identify changes they are motivated to make in order to overcome their obstacles. This group will be process-oriented, with patients participating in exploration of their own experiences, giving and receiving support, and processing challenge from other group members.   Therapeutic Goals: 1. Patient will identify personal and current obstacles as they relate to admission. 2. Patient will identify barriers that currently interfere with their wellness or overcoming obstacles.  3. Patient will identify feelings, thought process and behaviors related to these barriers. 4. Patient will identify two changes they are willing to make to overcome these obstacles:      Summary of Patient Progress  Pt continues to work towards their tx goals but has not yet reached them. Pt was able to appropriately participate in group discussion, and was able to offer support/validation to other group members. Pt reported his long term obstacle is, "staying on my medication. Sometimes I just feel like I don't want to take it anymore." Pt reported his short term obstacle is, "finding a job so I can do something else with my life." Pt reported to overcome his short term obstacle, he is working with [employer] to find a job.    Therapeutic Modalities:   Cognitive Behavioral Therapy Solution Focused Therapy Motivational Interviewing Relapse Prevention Therapy  Heidi DachKelsey Velecia Ovitt, MSW, LCSW Clinical Social  Worker 11/30/2017 2:11 PM

## 2017-11-30 NOTE — Plan of Care (Signed)
Patient rated his depression and anxiety 0/10.Denies SI ,HI and the voices are minimal.Patients goal for today is "stay focused."Visible in the milieu.Attended groups.Compliant with medications.Appetite and energy level good.Support and encouragement given.

## 2017-11-30 NOTE — Progress Notes (Signed)
[facility] MD Progress Note  11/30/2017 2:00 PM Anthony Luna  MRN:  [id number] Subjective:  Pt is more awake today and out on the unit. He states that he is starting to feel better. He states that the voices are improving. He feels like his mood is better. He denies SI or HI. He would like to keep the current doses of medications. He states that he attends [employer] program and likes doing this. HE is going to talk to his peer workers about possibly going to a group home.   Principal Problem: Schizoaffective disorder, depressive type (HCC) Diagnosis:   Patient Active Problem List   Diagnosis Date Noted  . Schizoaffective disorder, depressive type (HCC) [F25.1] 11/25/2017    Priority: High  . Cannabis use disorder, severe, dependence (HCC) [F12.20] 11/30/2014  . Schizoaffective disorder, bipolar type (HCC) [F25.0] 11/30/2014  . Tobacco use disorder [F17.200] 11/30/2014   Total Time spent with patient: 20 minutes  Past Psychiatric History: See h&P  Past Medical History:  Past Medical History:  Diagnosis Date  . Depression    Since moving into new apartment 1 year ago  . None to low serum cortisol response with adrenocorticotrophic hormone (ACTH) stimulation test   . Schizophrenia, paranoid type (HCC)    History reviewed. No pertinent surgical history. Family History: History reviewed. No pertinent family history. Family Psychiatric  History: See h&P Social History:  Social History   Substance and Sexual Activity  Alcohol Use Yes  . Alcohol/week: 0.0 oz   Comment: "Special occasions"     Social History   Substance and Sexual Activity  Drug Use Yes  . Frequency: 4.0 times per week  . Types: Marijuana   Comment: "2 blunts"    Social History   Socioeconomic History  . Marital status: Single    Spouse name: Not on file  . Number of children: Not on file  . Years of education: Not on file  . Highest education level: Not on file  Occupational History  . Not on file  Social  Needs  . Financial resource strain: Not on file  . Food insecurity:    Worry: Not on file    Inability: Not on file  . Transportation needs:    Medical: Not on file    Non-medical: Not on file  Tobacco Use  . Smoking status: Current Every Day Smoker    Packs/day: 0.50    Types: Cigarettes    Start date: 11/29/2002  . Smokeless tobacco: Former Neurosurgeon  . Tobacco comment: does not want to quit  Substance and Sexual Activity  . Alcohol use: Yes    Alcohol/week: 0.0 oz    Comment: "Special occasions"  . Drug use: Yes    Frequency: 4.0 times per week    Types: Marijuana    Comment: "2 blunts"  . Sexual activity: Not on file  Lifestyle  . Physical activity:    Days per week: Not on file    Minutes per session: Not on file  . Stress: Not on file  Relationships  . Social connections:    Talks on phone: Not on file    Gets together: Not on file    Attends religious service: Not on file    Active member of club or organization: Not on file    Attends meetings of clubs or organizations: Not on file    Relationship status: Not on file  Other Topics Concern  . Not on file  Social History Narrative  .  Not on file   Additional Social History:    Pain Medications: See PTA Prescriptions: See PTA Over the Counter: See PTA History of alcohol / drug use?: Yes(past) Longest period of sobriety (when/how long): unknown Negative Consequences of Use: Financial, Personal relationships, Work / Programmer, multimediachool Withdrawal Symptoms: Other (Comment)(none) Name of Substance 1: marijuana 1 - Age of First Use: 17 1 - Amount (size/oz): varies 1 - Frequency: infrequent 1 - Duration: Unable to quantify 1 - Last Use / Amount: Unknown                  Sleep: Good  Appetite:  Good  Current Medications: Current Facility-Administered Medications  Medication Dose Route Frequency Provider Last Rate Last Dose  . acetaminophen (TYLENOL) tablet 650 mg  650 mg Oral Q6H PRN [name], [name], MD   650 mg at  11/30/17 0841  . alum & mag hydroxide-simeth (MAALOX/MYLANTA) 200-200-20 MG/5ML suspension 30 mL  30 mL Oral Q4H PRN [name], [name], MD      . amantadine (SYMMETREL) capsule 100 mg  100 mg Oral BID [name], [name], MD   100 mg at 11/30/17 0839  . cloZAPine (CLOZARIL) tablet 175 mg  175 mg Oral QHS Brandie Lopes R, MD   175 mg at 11/29/17 2214  . hydrOXYzine (ATARAX/VISTARIL) tablet 50 mg  50 mg Oral TID PRN [name], [name], MD      . lithium carbonate (ESKALITH) CR tablet 450 mg  450 mg Oral Q12H [name], [name], MD   450 mg at 11/30/17 0839  . magnesium hydroxide (MILK OF MAGNESIA) suspension 30 mL  30 mL Oral Daily PRN [name], [name], MD   30 mL at 11/27/17 0849  . nicotine (NICODERM CQ - dosed in mg/24 hours) patch 21 mg  21 mg Transdermal Daily [name], [name], MD   21 mg at 11/30/17 [id number]  . propranolol (INDERAL) tablet 10 mg  10 mg Oral TID [name], [name], MD   10 mg at 11/30/17 1137  . traMADol (ULTRAM) tablet 50 mg  50 mg Oral Q8H PRN Haskell RilingMcNew, Dawit Tankard R, MD   50 mg at 11/29/17 2019  . traZODone (DESYREL) tablet 100 mg  100 mg Oral QHS PRN [name], [name], MD   100 mg at 11/29/17 2214    Lab Results: No results found for this or any previous visit (from the past 48 hour(s)).  Blood Alcohol level:  Lab Results  Component Value Date   ETH <10 11/24/2017   ETH <10 09/25/2017    Metabolic Disorder Labs: Lab Results  Component Value Date   HGBA1C 4.9 11/25/2017   MPG 93.93 11/25/2017   MPG 88.19 09/26/2017   Lab Results  Component Value Date   PROLACTIN 6.1 12/19/2014   PROLACTIN 1.3 (L) 11/30/2014   Lab Results  Component Value Date   CHOL 171 11/25/2017   TRIG 105 11/25/2017   HDL 57 11/25/2017   CHOLHDL 3.0 11/25/2017   VLDL 21 11/25/2017   LDLCALC 93 11/25/2017   LDLCALC 85 09/26/2017    Physical Findings: AIMS: Facial and Oral Movements Muscles of Facial Expression: None, normal Lips and Perioral Area: None, normal Jaw: None, normal Tongue: None, normal,Extremity  Movements Upper (arms, wrists, hands, fingers): None, normal Lower (legs, knees, ankles, toes): None, normal, Trunk Movements Neck, shoulders, hips: None, normal, Overall Severity Severity of abnormal movements (highest score from questions above): None, normal Incapacitation due to abnormal movements: None, normal Patient's awareness of abnormal movements (rate  only patient's report): No Awareness, Dental Status Current problems with teeth and/or dentures?: Yes(wisdom tooth extraction x 4) Does patient usually wear dentures?: No  CIWA:    COWS:     Musculoskeletal: Strength & Muscle Tone: within normal limits Gait & Station: normal Patient leans: N/A  Psychiatric Specialty Exam: Physical Exam  Nursing note and vitals reviewed.   Review of Systems  All other systems reviewed and are negative.   Blood pressure (!) 117/92, pulse (!) 106, temperature 98 F (36.7 C), temperature source Oral, resp. rate 16, height 6' (1.829 m), weight 87.1 kg (192 lb), SpO2 99 %.Body mass index is 26.04 kg/m.  General Appearance: Casual  Eye Contact:  Good  Speech:  Slow  Volume:  Normal  Mood:  Euthymic  Affect:  Constricted  Thought Process:  Coherent and Goal Directed  Orientation:  Full (Time, Place, and Person)  Thought Content:  Logical  Suicidal Thoughts:  No  Homicidal Thoughts:  No  Memory:  Immediate;   Fair  Judgement:  Fair  Insight:  Fair  Psychomotor Activity:  Normal  Concentration:  Concentration: Fair  Recall:  [employer] of Knowledge:  Fair  Language:  Fair  Akathisia:  No      Assets:  Resilience  ADL's:  Intact  Cognition:  WNL  Sleep:  Number of Hours: 6.15     Treatment Plan Summary: 32 yo male admitted for worsening AH. He is much less sedated today and over the weekend. He has brighter affect. He reports improvement in voices. He is vague with symptoms but overall organized and goal directed.   Plan:  Schizoaffective disorder -Continue Clozapine 175 mg  qhs, level 199. He would like to stay at this dose for now. May be able to increase if needed  -Continue Lithium 450 mg BID -Last TRinza dose was on 09/28/17  Dispo -He will follow up with [facility]   [name], MD 11/30/2017, 2:00 PM

## 2017-12-01 LAB — CBC WITH DIFFERENTIAL/PLATELET
BASOS ABS: 0 10*3/uL (ref 0–0.1)
Basophils Relative: 1 %
EOS ABS: 0.3 10*3/uL (ref 0–0.7)
EOS PCT: 4 %
HEMATOCRIT: 48.3 % (ref 40.0–52.0)
Hemoglobin: 16.6 g/dL (ref 13.0–18.0)
Lymphocytes Relative: 22 %
Lymphs Abs: 1.7 10*3/uL (ref 1.0–3.6)
MCH: 31.7 pg (ref 26.0–34.0)
MCHC: 34.4 g/dL (ref 32.0–36.0)
MCV: 92.2 fL (ref 80.0–100.0)
MONO ABS: 1.1 10*3/uL — AB (ref 0.2–1.0)
MONOS PCT: 15 %
NEUTROS ABS: 4.7 10*3/uL (ref 1.4–6.5)
Neutrophils Relative %: 58 %
PLATELETS: 203 10*3/uL (ref 150–440)
RBC: 5.24 MIL/uL (ref 4.40–5.90)
RDW: 13 % (ref 11.5–14.5)
WBC: 7.9 10*3/uL (ref 3.8–10.6)

## 2017-12-01 MED ORDER — PROPRANOLOL HCL 10 MG PO TABS: 10.0000 mg | ORAL_TABLET | Freq: Three times a day (TID) | ORAL | 0 refills | Status: DC

## 2017-12-01 MED ORDER — LITHIUM CARBONATE ER 450 MG PO TBCR: 450.0000 mg | EXTENDED_RELEASE_TABLET | Freq: Two times a day (BID) | ORAL | 0 refills | Status: DC

## 2017-12-01 MED ORDER — AMANTADINE HCL 100 MG PO CAPS: 100.0000 mg | ORAL_CAPSULE | Freq: Two times a day (BID) | ORAL | 0 refills | Status: DC

## 2017-12-01 MED ORDER — CLOZAPINE 50 MG PO TABS: 175.0000 mg | ORAL_TABLET | Freq: Every day | ORAL | 0 refills | Status: DC

## 2017-12-01 NOTE — Plan of Care (Signed)
Patient making some progress towards goals.  Problem: Safety: Goal: Ability to remain free from injury will improve Outcome: Progressing   Problem: Education: Goal: Ability to make informed decisions regarding treatment will improve Outcome: Progressing   Problem: Coping: Goal: Coping ability will improve Outcome: Progressing   Problem: Health Behavior/Discharge Planning: Goal: Identification of resources available to assist in meeting health care needs will improve Outcome: Progressing   Problem: Medication: Goal: Compliance with prescribed medication regimen will improve Outcome: Progressing   Problem: Self-Concept: Goal: Ability to disclose and discuss suicidal ideas will improve Outcome: Progressing Goal: Will verbalize positive feelings about self Outcome: Progressing   Problem: Health Behavior/Discharge Planning: Goal: Ability to identify changes in lifestyle to reduce recurrence of condition will improve Outcome: Progressing Goal: Identification of resources available to assist in meeting health care needs will improve Outcome: Progressing   Problem: Safety: Goal: Ability to remain free from injury will improve Outcome: Progressing   Problem: Education: Goal: Ability to state activities that reduce stress will improve Outcome: Progressing   Problem: Coping: Goal: Ability to identify and develop effective coping behavior will improve Outcome: Progressing   Problem: Self-Concept: Goal: Ability to identify factors that promote anxiety will improve Outcome: Progressing Goal: Level of anxiety will decrease Outcome: Progressing Goal: Ability to modify response to factors that promote anxiety will improve Outcome: Progressing   Problem: Education: Goal: Utilization of techniques to improve thought processes will improve Outcome: Progressing Goal: Knowledge of the prescribed therapeutic regimen will improve Outcome: Progressing   Problem: Activity: Goal:  Interest or engagement in leisure activities will improve Outcome: Progressing Goal: Imbalance in normal sleep/wake cycle will improve Outcome: Progressing   Problem: Coping: Goal: Coping ability will improve Outcome: Progressing Goal: Will verbalize feelings Outcome: Progressing   Problem: Health Behavior/Discharge Planning: Goal: Ability to make decisions will improve Outcome: Progressing Goal: Compliance with therapeutic regimen will improve Outcome: Progressing   Problem: Role Relationship: Goal: Will demonstrate positive changes in social behaviors and relationships Outcome: Progressing   Problem: Safety: Goal: Ability to disclose and discuss suicidal ideas will improve Outcome: Progressing Goal: Ability to identify and utilize support systems that promote safety will improve Outcome: Progressing   Problem: Self-Concept: Goal: Will verbalize positive feelings about self Outcome: Progressing Goal: Level of anxiety will decrease Outcome: Progressing

## 2017-12-01 NOTE — [facility] Group Notes (Signed)
CSW Group Therapy Note  12/01/2017  Time:  0900  Type of Therapy and Topic: Group Therapy: Goals Group: SMART Goals    Participation Level:  Did Not Attend    Description of Group:   The purpose of a daily goals group is to assist and guide patients in setting recovery/wellness-related goals. The objective is to set goals as they relate to the crisis in which they were admitted. Patients will be using SMART goal modalities to set measurable goals. Characteristics of realistic goals will be discussed and patients will be assisted in setting and processing how one will reach their goal. Facilitator will also assist patients in applying interventions and coping skills learned in psycho-education groups to the SMART goal and process how one will achieve defined goal.    Therapeutic Goals:  -Patients will develop and document one goal related to or their crisis in which brought them into treatment.  -Patients will be guided by LCSW using SMART goal setting modality in how to set a measurable, attainable, realistic and time sensitive goal.  -Patients will process barriers in reaching goal.  -Patients will process interventions in how to overcome and successful in reaching goal.    Patient's Goal:  Pt was invited to attend group but chose not to attend. CSW will continue to encourage pt to attend group throughout their admission.   Therapeutic Modalities:  Motivational Interviewing  Cognitive Behavioral Therapy  Crisis Intervention Model  SMART goals setting  Heidi DachKelsey Hitesh Fouche, MSW, LCSW Clinical Social Worker 12/01/2017 9:39 AM

## 2017-12-01 NOTE — [facility] Group Notes (Signed)
[facility] Group Notes:  (Nursing/MHT/Case Management/Adjunct)  Date:  12/01/2017  Time:  9:41 PM  Type of Therapy:  Group Therapy  Participation Level:  Active  Participation Quality:  Appropriate and Attentive  Affect:  Excited  Cognitive:  Alert and Appropriate  Insight:  Appropriate and Good  Engagement in Group:  Engaged and Supportive  Modes of Intervention:  Discussion, Socialization and Support  Summary of Progress/Problems: Anthony Luna was engaged in group. Anthony Luna shared with group and volunteered to do a group demonstration. Anthony Luna was excited about discharge planning for the next day. Anthony Luna stated he accomplished his goal on this day. Jinger NeighborsKeith D Jillianna Stanek 12/01/2017, 9:41 PM

## 2017-12-01 NOTE — [facility] Suicide Risk Assessment (Signed)
[facility] INPATIENT:  Family/Significant Other Suicide Prevention Education  Suicide Prevention Education:  Education Completed; Anthony Luna, with [employer] [employer], at [phone] has been identified by the patient as the family member/significant other with whom the patient will be residing, and identified as the person(s) who will aid the patient in the event of a mental health crisis (suicidal ideations/suicide attempt).  With written consent from the patient, the family member/significant other has been provided the following suicide prevention education, prior to the and/or following the discharge of the patient.  The suicide prevention education provided includes the following:  Suicide risk factors  Suicide prevention and interventions  National Suicide Hotline telephone number  [facility] assessment telephone number  [facility] Emergency Assistance 911  [facility] and/or Residential Mobile Crisis Unit telephone number  Request made of family/significant other to:  Remove weapons (e.g., guns, rifles, knives), all items previously/currently identified as safety concern.    Remove drugs/medications (over-the-counter, prescriptions, illicit drugs), all items previously/currently identified as a safety concern.  The family member/significant other verbalizes understanding of the suicide prevention education information provided.  The family member/significant other agrees to remove the items of safety concern listed above.  Anthony DachKelsey Braya Habermehl, LCSW 12/01/2017, 1:08 PM

## 2017-12-01 NOTE — Progress Notes (Signed)
Clozapine monitoring Consult   32 yo male ordered clozapine 175 mg PO at bedtime  06/18 ANC 4700  Information entered into Clozapine registry and pt eligible to receive clozapine Next labs due in a week - ordered for 06/25  Pharmacy will continue to follow.   Waldron LabsKaren K Heavyn Yearsley, RPh 12/01/2017 9:26 AM

## 2017-12-01 NOTE — Progress Notes (Signed)
Patient is guarded, but cooperative as he engages this Clinical research associatewriter. He denies depression as well as SI/HI, and states he has "not heard any voices this morning". He is encouraged to report any voices, and agreed. He is observed eating meals well with fluids. Emotional support provided. No self injurious behavior observed or reported. Will continue to monitor for care and safety.

## 2017-12-01 NOTE — [facility] Group Notes (Signed)
12/01/2017 1PM  Type of Therapy/Topic:  Group Therapy:  Feelings about Diagnosis  Participation Level:  Active   Description of Group:   This group will allow patients to explore their thoughts and feelings about diagnoses they have received. Patients will be guided to explore their level of understanding and acceptance of these diagnoses. Facilitator will encourage patients to process their thoughts and feelings about the reactions of others to their diagnosis and will guide patients in identifying ways to discuss their diagnosis with significant others in their lives. This group will be process-oriented, with patients participating in exploration of their own experiences, giving and receiving support, and processing challenge from other group members.   Therapeutic Goals: 1. Patient will demonstrate understanding of diagnosis as evidenced by identifying two or more symptoms of the disorder 2. Patient will be able to express two feelings regarding the diagnosis 3. Patient will demonstrate their ability to communicate their needs through discussion and/or role play  Summary of Patient Progress: Actively and appropriately engaged in the group. Patient was able to provide support and validation to other group members.Patient practiced active listening when interacting with the facilitator and other group members. Apolinar JunesBrandon spoke about his faith and how going to church has made him feel hopeful. "They said that I have a chemical imbalance in my brain and that God will throw it all away and I will get better." Patient is still in the process of obtaining treatment goals.        Therapeutic Modalities:   Cognitive Behavioral Therapy Brief Therapy Feelings Identification    Johny ShearsCassandra  Mirna Sutcliffe, LCSW 12/01/2017 2:20 PM

## 2017-12-01 NOTE — Progress Notes (Signed)
[facility] MD Progress Note  12/01/2017 12:44 PM Anthony Luna  MRN:  [id number] Subjective:  Pt states that he is doing well. He has been very calm and pleasant on the unit. He states that he only hears "good voices." He denies any command hallucinations. Denies SI or HI. He is organized and goal directed. He states that he feels that he is doing much better and wants to go home soon. He would like to keep his current doses of medications.   Principal Problem: Schizoaffective disorder, depressive type (HCC) Diagnosis:   Patient Active Problem List   Diagnosis Date Noted  . Schizoaffective disorder, depressive type (HCC) [F25.1] 11/25/2017    Priority: High  . Cannabis use disorder, severe, dependence (HCC) [F12.20] 11/30/2014  . Schizoaffective disorder, bipolar type (HCC) [F25.0] 11/30/2014  . Tobacco use disorder [F17.200] 11/30/2014   Total Time spent with patient: 15 minutes  Past Psychiatric History: See H&P  Past Medical History:  Past Medical History:  Diagnosis Date  . Depression    Since moving into new apartment 1 year ago  . None to low serum cortisol response with adrenocorticotrophic hormone (ACTH) stimulation test   . Schizophrenia, paranoid type (HCC)    History reviewed. No pertinent surgical history. Family History: History reviewed. No pertinent family history. Family Psychiatric  History: See h&P Social History:  Social History   Substance and Sexual Activity  Alcohol Use Yes  . Alcohol/week: 0.0 oz   Comment: "Special occasions"     Social History   Substance and Sexual Activity  Drug Use Yes  . Frequency: 4.0 times per week  . Types: Marijuana   Comment: "2 blunts"    Social History   Socioeconomic History  . Marital status: Single    Spouse name: Not on file  . Number of children: Not on file  . Years of education: Not on file  . Highest education level: Not on file  Occupational History  . Not on file  Social Needs  . Financial resource  strain: Not on file  . Food insecurity:    Worry: Not on file    Inability: Not on file  . Transportation needs:    Medical: Not on file    Non-medical: Not on file  Tobacco Use  . Smoking status: Current Every Day Smoker    Packs/day: 0.50    Types: Cigarettes    Start date: 11/29/2002  . Smokeless tobacco: Former Neurosurgeon  . Tobacco comment: does not want to quit  Substance and Sexual Activity  . Alcohol use: Yes    Alcohol/week: 0.0 oz    Comment: "Special occasions"  . Drug use: Yes    Frequency: 4.0 times per week    Types: Marijuana    Comment: "2 blunts"  . Sexual activity: Not on file  Lifestyle  . Physical activity:    Days per week: Not on file    Minutes per session: Not on file  . Stress: Not on file  Relationships  . Social connections:    Talks on phone: Not on file    Gets together: Not on file    Attends religious service: Not on file    Active member of club or organization: Not on file    Attends meetings of clubs or organizations: Not on file    Relationship status: Not on file  Other Topics Concern  . Not on file  Social History Narrative  . Not on file   Additional Social History:  Pain Medications: See PTA Prescriptions: See PTA Over the Counter: See PTA History of alcohol / drug use?: Yes(past) Longest period of sobriety (when/how long): unknown Negative Consequences of Use: Financial, Personal relationships, Work / School Withdrawal Symptoms: Other (Comment)(none) Name of Substance 1: marijuana 1 - Age of First Use: 17 1 - Amount (size/oz): varies 1 - Frequency: infrequent 1 - Duration: Unable to quantify 1 - Last Use / Amount: Unknown                  Sleep: Good  Appetite:  Good  Current Medications: Current Facility-Administered Medications  Medication Dose Route Frequency Provider Last Rate Last Dose  . acetaminophen (TYLENOL) tablet 650 mg  650 mg Oral Q6H PRN [name], [name], MD   650 mg at 11/30/17 0841  . alum & mag  hydroxide-simeth (MAALOX/MYLANTA) 200-200-20 MG/5ML suspension 30 mL  30 mL Oral Q4H PRN [name], [name], MD      . amantadine (SYMMETREL) capsule 100 mg  100 mg Oral BID [name], [name], MD   100 mg at 12/01/17 0823  . cloZAPine (CLOZARIL) tablet 175 mg  175 mg Oral QHS Haskell RilingMcNew, Shonte Beutler R, MD   175 mg at 11/30/17 2148  . hydrOXYzine (ATARAX/VISTARIL) tablet 50 mg  50 mg Oral TID PRN [name], [name], MD      . lithium carbonate (ESKALITH) CR tablet 450 mg  450 mg Oral Q12H [name], [name], MD   450 mg at 12/01/17 0823  . magnesium hydroxide (MILK OF MAGNESIA) suspension 30 mL  30 mL Oral Daily PRN [name], [name], MD   30 mL at 11/27/17 0849  . nicotine (NICODERM CQ - dosed in mg/24 hours) patch 21 mg  21 mg Transdermal Daily [name], [name], MD   21 mg at 12/01/17 0825  . propranolol (INDERAL) tablet 10 mg  10 mg Oral TID [name], [name], MD   10 mg at 12/01/17 1200  . traMADol (ULTRAM) tablet 50 mg  50 mg Oral Q8H PRN Haskell RilingMcNew, Jameison Haji R, MD   50 mg at 11/29/17 2019  . traZODone (DESYREL) tablet 100 mg  100 mg Oral QHS PRN [name], [name], MD   100 mg at 11/29/17 2214    Lab Results:  Results for orders placed or performed during the hospital encounter of 11/25/17 (from the past 48 hour(s))  CBC with Differential/Platelet     Status: Abnormal   Collection Time: 12/01/17  6:41 AM  Result Value Ref Range   WBC 7.9 3.8 - 10.6 K/uL   RBC 5.24 4.40 - 5.90 MIL/uL   Hemoglobin 16.6 13.0 - 18.0 g/dL   HCT 16.148.3 09.640.0 - 04.552.0 %   MCV 92.2 80.0 - 100.0 fL   MCH 31.7 26.0 - 34.0 pg   MCHC 34.4 32.0 - 36.0 g/dL   RDW 40.913.0 81.111.5 - 91.414.5 %   Platelets 203 150 - 440 K/uL   Neutrophils Relative % 58 %   Neutro Abs 4.7 1.4 - 6.5 K/uL   Lymphocytes Relative 22 %   Lymphs Abs 1.7 1.0 - 3.6 K/uL   Monocytes Relative 15 %   Monocytes Absolute 1.1 (H) 0.2 - 1.0 K/uL   Eosinophils Relative 4 %   Eosinophils Absolute 0.3 0 - 0.7 K/uL   Basophils Relative 1 %   Basophils Absolute 0.0 0 - 0.1 K/uL    Comment:  Performed at [facility], [address] [place], [place] [address]    Blood Alcohol  level:  Lab Results  Component Value Date   ETH <10 11/24/2017   ETH <10 09/25/2017    Metabolic Disorder Labs: Lab Results  Component Value Date   HGBA1C 4.9 11/25/2017   MPG 93.93 11/25/2017   MPG 88.19 09/26/2017   Lab Results  Component Value Date   PROLACTIN 6.1 12/19/2014   PROLACTIN 1.3 (L) 11/30/2014   Lab Results  Component Value Date   CHOL 171 11/25/2017   TRIG 105 11/25/2017   HDL 57 11/25/2017   CHOLHDL 3.0 11/25/2017   VLDL 21 11/25/2017   LDLCALC 93 11/25/2017   LDLCALC 85 09/26/2017    Physical Findings: AIMS: Facial and Oral Movements Muscles of Facial Expression: None, normal Lips and Perioral Area: None, normal Jaw: None, normal Tongue: None, normal,Extremity Movements Upper (arms, wrists, hands, fingers): None, normal Lower (legs, knees, ankles, toes): None, normal, Trunk Movements Neck, shoulders, hips: None, normal, Overall Severity Severity of abnormal movements (highest score from questions above): None, normal Incapacitation due to abnormal movements: None, normal Patient's awareness of abnormal movements (rate only patient's report): No Awareness, Dental Status Current problems with teeth and/or dentures?: Yes(wisdom tooth extraction x 4) Does patient usually wear dentures?: No  CIWA:    COWS:     Musculoskeletal: Strength & Muscle Tone: within normal limits Gait & Station: normal Patient leans: N/A  Psychiatric Specialty Exam: Physical Exam  ROS  Blood pressure 116/79, pulse (!) 111, temperature 97.8 F (36.6 C), temperature source Oral, resp. rate 16, height 6' (1.829 m), weight 87.1 kg (192 lb), SpO2 100 %.Body mass index is 26.04 kg/m.  General Appearance: Casual  Eye Contact:  Good  Speech:  Clear and Coherent  Volume:  Normal  Mood:  Euthymic  Affect:  Appropriate  Thought Process:  Coherent and Goal Directed  Orientation:   Full (Time, Place, and Person)  Thought Content:  Logical  Suicidal Thoughts:  No  Homicidal Thoughts:  No  Memory:  Immediate;   Fair  Judgement:  Fair  Insight:  Fair  Psychomotor Activity:  Normal  Concentration:  Concentration: Fair  Recall:  [employer] of Knowledge:  Fair  Language:  Fair  Akathisia:  No      Assets:  Resilience  ADL's:  Intact  Cognition:  WNL  Sleep:  Number of Hours: 8.3     Treatment Plan Summary: 32 yo male admitted due to worsening AH. CSW spoke with his peer support. She reported that he had been doing well recently. He has been using marijuana excessively and hanging around a bad group of people. They are working on getting him into a group home which he prefers. He could benefit from better structure.   Plan:  Schizoaffective disorder -Continue Clozapine 175 mg daily -Continue Lithium 450 mg BID -Last trinza dose was on 09/28/17  Dispo -he will follow up with [facility]. His CST is working on group home placement for him. Discharge home tomorrow  [name], MD 12/01/2017, 12:44 PM

## 2017-12-02 MED ORDER — CLOZAPINE 50 MG PO TABS: 175.0000 mg | ORAL_TABLET | Freq: Every day | ORAL | 0 refills | Status: DC

## 2017-12-02 MED ORDER — LITHIUM CARBONATE ER 450 MG PO TBCR: 450.0000 mg | EXTENDED_RELEASE_TABLET | Freq: Two times a day (BID) | ORAL | 0 refills | Status: DC

## 2017-12-02 MED ORDER — PROPRANOLOL HCL 10 MG PO TABS: 10.0000 mg | ORAL_TABLET | Freq: Three times a day (TID) | ORAL | 0 refills | Status: DC

## 2017-12-02 MED ORDER — AMANTADINE HCL 100 MG PO CAPS: 100.0000 mg | ORAL_CAPSULE | Freq: Two times a day (BID) | ORAL | 0 refills | Status: DC

## 2017-12-02 NOTE — Progress Notes (Signed)
Patient ID: [name], male   DOB: 08/28/1985, 32 y.o.   MRN: [id number] Mood and affect, happy, up beat, visible and interacting well with peers, attended the wrap-up group, asked for hygiene products, showered, complied with medications as ordered, denied SI/HI/AVH, "I am going home tomorrow, going to my own apartment.Marland Kitchen.Marland Kitchen."

## 2017-12-02 NOTE — Progress Notes (Signed)
  [facility] Discharge Plan :  Will you be returning to the same living situation after discharge:  Yes,  returning home. At discharge, do you have transportation home?: Yes,  Earleen ReaperBarbara Clapp. Do you have the ability to pay for your medications: Yes,  Peer support with [facility]  Release of information consent forms completed and in the chart;  Patient's signature needed at discharge.  Patient to Follow up at: Follow-up Information    [name] Go on 12/09/2017.   Why:  Please go to your hospital follow up appointment on Wednesday, 12/09/17 at 9AM. Thank you! Contact information: [address] [place] [address] [phone]           Next level of care provider has access to [facility] Link:no  Safety Planning and Suicide Prevention discussed: Yes,  with pt and Earleen ReaperBarbara Clapp.  Have you used any form of tobacco in the last 30 days? (Cigarettes, Smokeless Tobacco, Cigars, and/or Pipes): Yes  Has patient been referred to the Quitline?: Patient refused referral  Patient has been referred for addiction treatment: N/A  Heidi DachKelsey Waldon Sheerin, LCSW 12/02/2017, 9:10 AM

## 2017-12-02 NOTE — Progress Notes (Signed)
Recreation Therapy Notes  INPATIENT RECREATION TR PLAN  Patient Details Name: Vermon Grays MRN: [id number] DOB: 16-Mar-1986 Today's Date: 12/02/2017  Rec Therapy Plan Is patient appropriate for Therapeutic Recreation?: Yes Treatment times per week: at least 3 Estimated Length of Stay: 5-7 days TR Treatment/Interventions: Group participation (Comment)  Discharge Criteria Pt will be discharged from therapy if:: Discharged Treatment plan/goals/alternatives discussed and agreed upon by:: Patient/family  Discharge Summary Short term goals set: Patient will identify 3 triggers to anxiety within 5 recreation therapy group sessions Short term goals met: Not met Progress toward goals comments: Groups attended Which groups?: Other (Comment)(Team work) Reason goals not met: Patient spent most of his time in his room Therapeutic equipment acquired: N/A Reason patient discharged from therapy: Discharge from hospital Pt/family agrees with progress & goals achieved: Yes Date patient discharged from therapy: 12/02/17   Keiondre Colee 12/02/2017, 3:22 PM

## 2017-12-02 NOTE — Discharge Summary (Signed)
Physician Discharge Summary Note  Patient:  Anthony Luna is an 32 y.o., male MRN:  [id number] DOB:  12-27-1985 Patient phone:  [phone] (home)  Patient address:   [address] [place] [address],  Total Time spent with patient: 20 minutes  Date of Admission:  11/25/2017 Date of Discharge: 12/02/17  Reason for Admission:  AH  Principal Problem: Schizoaffective disorder, depressive type [facility]) Discharge Diagnoses: Patient Active Problem List   Diagnosis Date Noted  . Schizoaffective disorder, depressive type (HCC) [F25.1] 11/25/2017    Priority: High  . Cannabis use disorder, severe, dependence (HCC) [F12.20] 11/30/2014  . Schizoaffective disorder, bipolar type (HCC) [F25.0] 11/30/2014  . Tobacco use disorder [F17.200] 11/30/2014    Past Psychiatric History: See H&P  Past Medical History:  Past Medical History:  Diagnosis Date  . Depression    Since moving into new apartment 1 year ago  . None to low serum cortisol response with adrenocorticotrophic hormone (ACTH) stimulation test   . Schizophrenia, paranoid type (HCC)    History reviewed. No pertinent surgical history. Family History: History reviewed. No pertinent family history. Family Psychiatric  History: See H&P Social History:  Social History   Substance and Sexual Activity  Alcohol Use Yes  . Alcohol/week: 0.0 oz   Comment: "Special occasions"     Social History   Substance and Sexual Activity  Drug Use Yes  . Frequency: 4.0 times per week  . Types: Marijuana   Comment: "2 blunts"    Social History   Socioeconomic History  . Marital status: Single    Spouse name: Not on file  . Number of children: Not on file  . Years of education: Not on file  . Highest education level: Not on file  Occupational History  . Not on file  Social Needs  . Financial resource strain: Not on file  . Food insecurity:    Worry: Not on file    Inability: Not on file  . Transportation needs:   Medical: Not on file    Non-medical: Not on file  Tobacco Use  . Smoking status: Current Every Day Smoker    Packs/day: 0.50    Types: Cigarettes    Start date: 11/29/2002  . Smokeless tobacco: Former Neurosurgeon  . Tobacco comment: does not want to quit  Substance and Sexual Activity  . Alcohol use: Yes    Alcohol/week: 0.0 oz    Comment: "Special occasions"  . Drug use: Yes    Frequency: 4.0 times per week    Types: Marijuana    Comment: "2 blunts"  . Sexual activity: Not on file  Lifestyle  . Physical activity:    Days per week: Not on file    Minutes per session: Not on file  . Stress: Not on file  Relationships  . Social connections:    Talks on phone: Not on file    Gets together: Not on file    Attends religious service: Not on file    Active member of club or organization: Not on file    Attends meetings of clubs or organizations: Not on file    Relationship status: Not on file  Other Topics Concern  . Not on file  Social History Narrative  . Not on file    Hospital Course:  Pt was initially started on home medications with an increase in Clozapine at 200mg . HE was sedated through the first few days. Possibly from Clozapine or because he recently got his  wisdom teeth taken out. Clozapine was lowered to 175 mg. ANC was monitored. Last CBC done on 12/01/17 and showed ANC of 4.7. He was less sedated on this dose. He was coming out of his room more and interacting well with peers. He was very calm and polite on the unit. On day of discharge, he was bright and cheerful. He stated that the voices are completely gone. Denies SI or any thoughts of self harm. Denies HI or AH. He is looking forward to discharging. His CST is helping him with a group home which he is looking forward to. He is organized and goal directed.   The patient is at low risk of imminent suicide. Patient denied thoughts, intent, or plan for harm to self or others, expressed significant future orientation, and  expressed an ability to mobilize assistance for his needs. he is presently void of any contributing psychiatric symptoms, cognitive difficulties, or substance use which would elevate his risk for lethality. Chronic risk for lethality is elevated in light of male gender, chronic psychotic disorder. The chronic risk is presently mitigated by his ongoing desire and [facility] and mobilization of support from family and friends. Chronic risk may elevate if he experiences any significant loss or worsening of symptoms, which can be managed and monitored through outpatient providers. At this time,a cute risk for lethality is low and he is stable for ongoing outpatient management.   Modifiable risk factors were addressed during this hospitalization through appropriate pharmacotherapy and establishment of outpatient follow-up treatment. Some risk factors for suicide are situational (i.e. Unstable housing) or related personality pathology (i.e. Poor coping mechanisms) and thus cannot be further mitigated by continued hospitalization in this setting.   CBC    Component Value Date/Time   WBC 7.9 12/01/2017 0641   RBC 5.24 12/01/2017 0641   HGB 16.6 12/01/2017 0641   HGB 17.7 06/27/2014 1002   HCT 48.3 12/01/2017 0641   HCT 53.3 (H) 06/27/2014 1002   PLT 203 12/01/2017 0641   PLT 198 06/27/2014 1002   MCV 92.2 12/01/2017 0641   MCV 94 06/27/2014 1002   MCH 31.7 12/01/2017 0641   MCHC 34.4 12/01/2017 0641   RDW 13.0 12/01/2017 0641   RDW 13.8 06/27/2014 1002   LYMPHSABS 1.7 12/01/2017 0641   MONOABS 1.1 (H) 12/01/2017 0641   EOSABS 0.3 12/01/2017 0641   BASOSABS 0.0 12/01/2017 0641   12/01/17 Neutro Abs 1.4 - 6.5 K/uL 4.7      Physical Findings: AIMS: Facial and Oral Movements Muscles of Facial Expression: None, normal Lips and Perioral Area: None, normal Jaw: None, normal Tongue: None, normal,Extremity Movements Upper (arms, wrists, hands, fingers): None, normal Lower (legs,  knees, ankles, toes): None, normal, Trunk Movements Neck, shoulders, hips: None, normal, Overall Severity Severity of abnormal movements (highest score from questions above): None, normal Incapacitation due to abnormal movements: None, normal Patient's awareness of abnormal movements (rate only patient's report): No Awareness, Dental Status Current problems with teeth and/or dentures?: Yes(wisdom tooth extraction x 4) Does patient usually wear dentures?: No  CIWA:    COWS:     Musculoskeletal: Strength & Muscle Tone: within normal limits Gait & Station: normal Patient leans: N/A  Psychiatric Specialty Exam: Physical Exam  ROS  Blood pressure (!) 103/91, pulse 95, temperature 98.6 F (37 C), temperature source Oral, resp. rate 18, height 6' (1.829 m), weight 87.1 kg (192 lb), SpO2 97 %.Body mass index is 26.04 kg/m.  General Appearance: Casual  Eye Contact:  Good  Speech:  Clear and Coherent  Volume:  Normal  Mood:  Euthymic  Affect:  Congruent  Thought Process:  Coherent and Goal Directed  Orientation:  Full (Time, Place, and Person)  Thought Content:  Logical  Suicidal Thoughts:  No  Homicidal Thoughts:  No  Memory:  Remote;   Fair  Judgement:  Fair  Insight:  Fair  Psychomotor Activity:  Normal  Concentration:  Concentration: Fair  Recall:  Fair  Fund of Knowledge:  Fair  Language:  Fair  Akathisia:  No      Assets:  Resilience  ADL's:  Intact  Cognition:  WNL  Sleep:  Number of Hours: 7.3     Have you used any form of tobacco in the last 30 days? (Cigarettes, Smokeless Tobacco, Cigars, and/or Pipes): Yes  Has this patient used any form of tobacco in the last 30 days? (Cigarettes, Smokeless Tobacco, Cigars, and/or Pipes) Yes, Yes, A prescription for an FDA-approved tobacco cessation medication was offered at discharge and the patient refused  Blood Alcohol level:  Lab Results  Component Value Date   [facility] <10 11/24/2017   ETH <10 09/25/2017    Metabolic  Disorder Labs:  Lab Results  Component Value Date   HGBA1C 4.9 11/25/2017   MPG 93.93 11/25/2017   MPG 88.19 09/26/2017   Lab Results  Component Value Date   PROLACTIN 6.1 12/19/2014   PROLACTIN 1.3 (L) 11/30/2014   Lab Results  Component Value Date   CHOL 171 11/25/2017   TRIG 105 11/25/2017   HDL 57 11/25/2017   CHOLHDL 3.0 11/25/2017   VLDL 21 11/25/2017   LDLCALC 93 11/25/2017   LDLCALC 85 09/26/2017    See Psychiatric Specialty Exam and Suicide Risk Assessment completed by Attending Physician prior to discharge.  Discharge destination:  Home  Is patient on multiple antipsychotic therapies at discharge:  Yes,   Do you recommend tapering to monotherapy for antipsychotics?  No   Has Patient had three or more failed trials of antipsychotic monotherapy by history:  Yes,   Antipsychotic medications that previously failed include:   1.  Invega., 2.  Prolixin. and 3.  Clozapine.  Recommended Plan for Multiple Antipsychotic Therapies: NA  Discharge Instructions    Increase activity slowly   Complete by:  As directed      Allergies as of 12/02/2017   No Known Allergies     Medication List    TAKE these medications     Indication  amantadine 100 MG capsule Commonly known as:  SYMMETREL Take 1 capsule (100 mg total) by mouth 2 (two) times daily.  Indication:  Extrapyramidal Reaction caused by Medications   clozapine 50 MG tablet Commonly known as:  CLOZARIL Take 3.5 tablets (175 mg total) by mouth at bedtime.  Indication:  Schizophrenia that does Not Respond to Usual Drug Therapy   INVEGA TRINZA 819 MG/2.625ML Susp Generic drug:  Paliperidone Palmitate Inject 819 mg into the muscle.  Indication:  Schizophrenia   lithium carbonate 450 MG CR tablet Commonly known as:  ESKALITH Take 1 tablet (450 mg total) by mouth 2 (two) times daily.  Indication:  Schizoaffective Disorder   propranolol 10 MG tablet Commonly known as:  INDERAL Take 1 tablet (10 mg total) by  mouth 3 (three) times daily.  Indication:  Neuroleptic-Induced Akathisia      Follow-up Information    [name] Go on 12/09/2017.   Why:  Please go to your hospital follow up appointment on  Wednesday, 12/09/17 at 9AM. Thank you! Contact information: [address] [place] [address] [phone]          Next CBC due 12/08/17  Signed: [name], MD 12/02/2017, 9:03 AM

## 2017-12-02 NOTE — Plan of Care (Signed)
Patient slept for Estimated Hours of 7.30; Precautionary checks every 15 minutes for safety maintained, room free of safety hazards, patient sustains no injury or falls during this shift.  Problem: Safety: Goal: Ability to remain free from injury will improve Outcome: Progressing   Problem: Education: Goal: Ability to make informed decisions regarding treatment will improve Outcome: Progressing   Problem: Coping: Goal: Coping ability will improve Outcome: Progressing   Problem: Medication: Goal: Compliance with prescribed medication regimen will improve Outcome: Progressing   Problem: Self-Concept: Goal: Ability to disclose and discuss suicidal ideas will improve Outcome: Progressing   Problem: Safety: Goal: Ability to remain free from injury will improve Outcome: Progressing

## 2017-12-02 NOTE — Progress Notes (Signed)
Recreation Therapy Notes   Date: 12/02/2017  Time: 9:30 am  Location: Craft Room  Behavioral response: Appropriate   Intervention Topic: Team Work  Discussion/Intervention:  Group content on today was focused on teamwork. The group identified what teamwork is. Individuals described who is a part of their team. Patients expressed why they thought teamwork is important. The group stated reasons why they thought it was easier to work with a Comptrollersmaller/larger team. Individuals discussed some positives and negatives of working with a team. Patients gave examples of past experiences they had while working with a team. The group participated in the intervention "Story in a bag", patients were in groups and were able to test their skill in a team setting.  Clinical Observations/Feedback:  Patient came to group and was focused on what peers and staff had to say about team work. Individual was social with peers and staff while participating in the intervention during group.  Darlin Stenseth LRT/CTRS         Kharter Sestak 12/02/2017 12:13 PM

## 2017-12-02 NOTE — Progress Notes (Addendum)
Patient continues to be calm and cooperative as he engages this Clinical research associatewriter. He denies depression as well as SI/HI/AVH. He is progressing positively towards goals. He is med compliant. He is progressing positively towards goals.  He is observed eating meals well with fluids. Emotional support provided. No self injurious behavior observed or reported. Will continue to monitor for care and safety.

## 2017-12-02 NOTE — Progress Notes (Signed)
Patient ID: Anthony Luna, male   DOB: 02/22/1986, 32 y.o.   MRN: [id number]  Orders received to discharge patient. Patient denies SI/HI/AVH at this time. Discharge instructions, medications, presciptions and follow-up care reviewed and printed instructions provided to patient. Patient understood all instructions, and has no further questions. Patient's belongings returned. Patient discharged from facility at this time.

## 2017-12-02 NOTE — Plan of Care (Signed)
Patient making progress towards goals. He stated personal goals of "thinking positive and taking medicine".  Problem: Safety: Goal: Ability to remain free from injury will improve Outcome: Progressing   Problem: Education: Goal: Ability to make informed decisions regarding treatment will improve Outcome: Progressing   Problem: Coping: Goal: Coping ability will improve Outcome: Progressing   Problem: Health Behavior/Discharge Planning: Goal: Identification of resources available to assist in meeting health care needs will improve Outcome: Progressing   Problem: Medication: Goal: Compliance with prescribed medication regimen will improve Outcome: Progressing   Problem: Self-Concept: Goal: Ability to disclose and discuss suicidal ideas will improve Outcome: Progressing Goal: Will verbalize positive feelings about self Outcome: Progressing   Problem: Health Behavior/Discharge Planning: Goal: Ability to identify changes in lifestyle to reduce recurrence of condition will improve Outcome: Progressing Goal: Identification of resources available to assist in meeting health care needs will improve Outcome: Progressing   Problem: Safety: Goal: Ability to remain free from injury will improve Outcome: Progressing   Problem: Education: Goal: Ability to state activities that reduce stress will improve Outcome: Progressing   Problem: Coping: Goal: Ability to identify and develop effective coping behavior will improve Outcome: Progressing   Problem: Self-Concept: Goal: Ability to identify factors that promote anxiety will improve Outcome: Progressing Goal: Level of anxiety will decrease Outcome: Progressing Goal: Ability to modify response to factors that promote anxiety will improve Outcome: Progressing   Problem: Education: Goal: Utilization of techniques to improve thought processes will improve Outcome: Progressing Goal: Knowledge of the prescribed therapeutic regimen will  improve Outcome: Progressing   Problem: Activity: Goal: Interest or engagement in leisure activities will improve Outcome: Progressing Goal: Imbalance in normal sleep/wake cycle will improve Outcome: Progressing   Problem: Coping: Goal: Coping ability will improve Outcome: Progressing Goal: Will verbalize feelings Outcome: Progressing   Problem: Health Behavior/Discharge Planning: Goal: Ability to make decisions will improve Outcome: Progressing Goal: Compliance with therapeutic regimen will improve Outcome: Progressing   Problem: Role Relationship: Goal: Will demonstrate positive changes in social behaviors and relationships Outcome: Progressing   Problem: Safety: Goal: Ability to disclose and discuss suicidal ideas will improve Outcome: Progressing Goal: Ability to identify and utilize support systems that promote safety will improve Outcome: Progressing   Problem: Self-Concept: Goal: Will verbalize positive feelings about self Outcome: Progressing Goal: Level of anxiety will decrease Outcome: Progressing

## 2017-12-02 NOTE — [facility] Suicide Risk Assessment (Signed)
[facility]   Principal Problem: Schizoaffective disorder, depressive type [facility](HCC) Discharge Diagnoses:  Patient Active Problem List   Diagnosis Date Noted  . Schizoaffective disorder, depressive type (HCC) [F25.1] 11/25/2017    Priority: High  . Cannabis use disorder, severe, dependence (HCC) [F12.20] 11/30/2014  . Schizoaffective disorder, bipolar type (HCC) [F25.0] 11/30/2014  . Tobacco use disorder [F17.200] 11/30/2014    Mental Status Per Nursing Assessment::   On Admission:  NA(denies)  Demographic Factors:  Male and Living alone  Loss Factors: NA  Historical Factors: NA  Risk Reduction Factors:   Positive social support, Positive therapeutic relationship and Positive coping skills or problem solving skills  Continued Clinical Symptoms:  Schizophrenia:   Less than 31 years old  Cognitive Features That Contribute To Risk:  None    Suicide Risk:  Minimal: No identifiable suicidal ideation.    Follow-up Information    [facility]. Go on 12/09/2017.   Why:  Please go to your hospital follow up appointment on Wednesday, 12/09/17 at 9AM. Thank you! Contact information: [address] [place] [address] [phone]           Plan Of Care/Follow-up recommendations: Follow up with [facility]  Anthony RilingHolly R Princesa Willig, MD 12/02/2017, 8:17 AM

## 2018-05-04 ENCOUNTER — Emergency Department
Admission: EM | Admit: 2018-05-04 | Discharge: 2018-05-05 | Disposition: A | Discharge status: Other Acute Inpatient Hospital | Payer: Medicaid Other | Attending: Student in an Organized Health Care Education/Training Program | Admitting: Student in an Organized Health Care Education/Training Program

## 2018-05-04 ENCOUNTER — Encounter: Payer: Self-pay | Admitting: Emergency Medicine

## 2018-05-04 ENCOUNTER — Other Ambulatory Visit: Payer: Self-pay

## 2018-05-04 DIAGNOSIS — R443 Hallucinations, unspecified: Secondary | ICD-10-CM | POA: Diagnosis present

## 2018-05-04 DIAGNOSIS — R45851 Suicidal ideations: Secondary | ICD-10-CM | POA: Insufficient documentation

## 2018-05-04 DIAGNOSIS — F200 Paranoid schizophrenia: Secondary | ICD-10-CM | POA: Diagnosis not present

## 2018-05-04 DIAGNOSIS — F250 Schizoaffective disorder, bipolar type: Secondary | ICD-10-CM | POA: Diagnosis not present

## 2018-05-04 DIAGNOSIS — F1220 Cannabis dependence, uncomplicated: Secondary | ICD-10-CM | POA: Insufficient documentation

## 2018-05-04 DIAGNOSIS — F329 Major depressive disorder, single episode, unspecified: Secondary | ICD-10-CM | POA: Insufficient documentation

## 2018-05-04 DIAGNOSIS — Z79899 Other long term (current) drug therapy: Secondary | ICD-10-CM | POA: Insufficient documentation

## 2018-05-04 DIAGNOSIS — F17210 Nicotine dependence, cigarettes, uncomplicated: Secondary | ICD-10-CM | POA: Diagnosis not present

## 2018-05-04 DIAGNOSIS — F22 Delusional disorders: Secondary | ICD-10-CM

## 2018-05-04 LAB — URINE DRUG SCREEN, QUALITATIVE (ARMC ONLY)
AMPHETAMINES, UR SCREEN: NOT DETECTED
Barbiturates, Ur Screen: NOT DETECTED
Benzodiazepine, Ur Scrn: NOT DETECTED
Cannabinoid 50 Ng, Ur ~~LOC~~: NOT DETECTED
Cocaine Metabolite,Ur ~~LOC~~: NOT DETECTED
MDMA (ECSTASY) UR SCREEN: NOT DETECTED
Methadone Scn, Ur: NOT DETECTED
Opiate, Ur Screen: NOT DETECTED
PHENCYCLIDINE (PCP) UR S: NOT DETECTED
Tricyclic, Ur Screen: NOT DETECTED

## 2018-05-04 LAB — CBC
HEMATOCRIT: 48.5 % (ref 39.0–52.0)
Hemoglobin: 16.6 g/dL (ref 13.0–17.0)
MCH: 31.6 pg (ref 26.0–34.0)
MCHC: 34.2 g/dL (ref 30.0–36.0)
MCV: 92.2 fL (ref 80.0–100.0)
NRBC: 0 % (ref 0.0–0.2)
Platelets: 197 10*3/uL (ref 150–400)
RBC: 5.26 MIL/uL (ref 4.22–5.81)
RDW: 13.2 % (ref 11.5–15.5)
WBC: 9.9 10*3/uL (ref 4.0–10.5)

## 2018-05-04 LAB — COMPREHENSIVE METABOLIC PANEL
ALBUMIN: 4.4 g/dL (ref 3.5–5.0)
ALK PHOS: 66 U/L (ref 38–126)
ALT: 44 U/L (ref 0–44)
ANION GAP: 6 (ref 5–15)
AST: 33 U/L (ref 15–41)
BUN: 16 mg/dL (ref 6–20)
CHLORIDE: 108 mmol/L (ref 98–111)
CO2: 24 mmol/L (ref 22–32)
Calcium: 9.4 mg/dL (ref 8.9–10.3)
Creatinine, Ser: 1.33 mg/dL — ABNORMAL HIGH (ref 0.61–1.24)
GFR calc Af Amer: >60 mL/min (ref >60)
GFR calc non Af Amer: >60 mL/min (ref >60)
GLUCOSE: 89 mg/dL (ref 70–99)
POTASSIUM: 3.9 mmol/L (ref 3.5–5.1)
Sodium: 138 mmol/L (ref 135–145)
Total Bilirubin: 0.8 mg/dL (ref 0.3–1.2)
Total Protein: 7.6 g/dL (ref 6.5–8.1)

## 2018-05-04 LAB — ETHANOL: Alcohol, Ethyl (B): <10 mg/dL (ref <10)

## 2018-05-04 NOTE — ED Notes (Signed)
Sweat pants, sweatshirt, 2 T-shirts, no jewelry , tennis shoes and socks noted in belongings bag

## 2018-05-04 NOTE — ED Triage Notes (Signed)
First Nurse Note:  Arrives for c/o hearing voices for the past few days.  Patient denies SI/ HI and is calm and cooperative.  NAD.  [facility] is a [facility] and arrives with home owner.
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
## 2018-05-04 NOTE — ED Provider Notes (Signed)
[facility] Emergency Department Provider Note    First MD Initiated Contact with Patient 05/04/18 1836     (approximate)  I have reviewed the triage vital signs and the nursing notes.   HISTORY  Chief Complaint Hallucinations    HPI Anthony Luna is a 32 y.o. male presents to the ER for evaluation of hallucinations as well as SI.  Patient does have a history of schizophrenia.  States that for the past few days he is having voices more frequently.  States he also had a hallucination that 1 of the staff members came up behind him and stabbed him.  Does feel more paranoid.  States he does have more frequent thoughts of harming himself over the past few days with a plan to run out into traffic.  Denies any changes to any medications.  Is currently on in [place].    Past Medical History:  Diagnosis Date  . Depression    Since moving into new apartment 1 year ago  . None to low serum cortisol response with adrenocorticotrophic hormone (ACTH) stimulation test   . Schizophrenia, paranoid type (HCC)    No family history on file. History reviewed. No pertinent surgical history. Patient Active Problem List   Diagnosis Date Noted  . Schizoaffective disorder, depressive type (HCC) 11/25/2017  . Cannabis use disorder, severe, dependence (HCC) 11/30/2014  . Schizoaffective disorder, bipolar type (HCC) 11/30/2014  . Tobacco use disorder 11/30/2014      Prior to Admission medications   Medication Sig Start Date End Date Taking? Authorizing Provider  amantadine (SYMMETREL) 100 MG capsule Take 1 capsule (100 mg total) by mouth 2 (two) times daily. 12/02/17   [name], [name], MD  clozapine (CLOZARIL) 50 MG tablet Take 3.5 tablets (175 mg total) by mouth at bedtime. 12/02/17 01/01/18  [name], [name], MD  lithium carbonate (ESKALITH) 450 MG CR tablet Take 1 tablet (450 mg total) by mouth 2 (two) times daily. 12/02/17   [name], [name], MD  Paliperidone Palmitate  (INVEGA TRINZA) 819 MG/2.625ML SUSP Inject 819 mg into the muscle.    [provider]  propranolol (INDERAL) 10 MG tablet Take 1 tablet (10 mg total) by mouth 3 (three) times daily. 12/02/17   [name], [name], MD    Allergies Patient has no known allergies.    Social History Social History   Tobacco Use  . Smoking status: Current Every Day Smoker    Packs/day: 0.50    Types: Cigarettes    Start date: 11/29/2002  . Smokeless tobacco: Former Neurosurgeon  . Tobacco comment: does not want to quit  Substance Use Topics  . Alcohol use: Yes    Alcohol/week: 0.0 standard drinks    Comment: "Special occasions"  . Drug use: Yes    Frequency: 4.0 times per week    Types: Marijuana    Comment: "2 blunts"    Review of Systems Patient denies headaches, rhinorrhea, blurry vision, numbness, shortness of breath, chest pain, edema, cough, abdominal pain, nausea, vomiting, diarrhea, dysuria, fevers, rashes or hallucinations unless otherwise stated above in HPI. ____________________________________________   PHYSICAL EXAM:  VITAL SIGNS: Vitals:   05/04/18 1810  BP: 122/76  Pulse: 93  Resp: 16  Temp: 98.1 F (36.7 C)  SpO2: 98%    Constitutional: Alert and oriented.  Eyes: Conjunctivae are normal.  Head: Atraumatic. Nose: No congestion/rhinnorhea. Mouth/Throat: Mucous membranes are moist.   Neck: No stridor. Painless ROM.  Cardiovascular: Normal rate, regular rhythm. Grossly  normal heart sounds.  Good peripheral circulation. Respiratory: Normal respiratory effort.  No retractions. Lungs CTAB. Gastrointestinal: Soft and nontender. No distention. No abdominal bruits. No CVA tenderness. Genitourinary:  Musculoskeletal: No lower extremity tenderness nor edema.  No joint effusions. Neurologic:  Normal speech and language. No gross focal neurologic deficits are appreciated. No facial droop Skin:  Skin is warm, dry and intact. No rash noted. Psychiatric: withdrawn, paranoid appearing.    ____________________________________________   LABS (all labs ordered are listed, but only abnormal results are displayed)  Results for orders placed or performed during the hospital encounter of 05/04/18 (from the past 24 hour(s))  Comprehensive metabolic panel     Status: Abnormal   Collection Time: 05/04/18  6:16 PM  Result Value Ref Range   Sodium 138 135 - 145 mmol/L   Potassium 3.9 3.5 - 5.1 mmol/L   Chloride 108 98 - 111 mmol/L   CO2 24 22 - 32 mmol/L   Glucose, Bld 89 70 - 99 mg/dL   BUN 16 6 - 20 mg/dL   Creatinine, Ser 1.611.33 (H) 0.61 - 1.24 mg/dL   Calcium 9.4 8.9 - 09.610.3 mg/dL   Total Protein 7.6 6.5 - 8.1 g/dL   Albumin 4.4 3.5 - 5.0 g/dL   AST 33 15 - 41 U/L   ALT 44 0 - 44 U/L   Alkaline Phosphatase 66 38 - 126 U/L   Total Bilirubin 0.8 0.3 - 1.2 mg/dL   GFR calc non Af Amer >60 >60 mL/min   GFR calc Af Amer >60 >60 mL/min   Anion gap 6 5 - 15  cbc     Status: None   Collection Time: 05/04/18  6:16 PM  Result Value Ref Range   WBC 9.9 4.0 - 10.5 K/uL   RBC 5.26 4.22 - 5.81 MIL/uL   Hemoglobin 16.6 13.0 - 17.0 g/dL   HCT 04.548.5 40.939.0 - 81.152.0 %   MCV 92.2 80.0 - 100.0 fL   MCH 31.6 26.0 - 34.0 pg   MCHC 34.2 30.0 - 36.0 g/dL   RDW 91.413.2 78.211.5 - 95.615.5 %   Platelets 197 150 - 400 K/uL   nRBC 0.0 0.0 - 0.2 %  Urine Drug Screen, Qualitative     Status: None   Collection Time: 05/04/18  6:16 PM  Result Value Ref Range   Tricyclic, Ur Screen NONE DETECTED NONE DETECTED   Amphetamines, Ur Screen NONE DETECTED NONE DETECTED   MDMA (Ecstasy)Ur Screen NONE DETECTED NONE DETECTED   Cocaine Metabolite,Ur [place] NONE DETECTED NONE DETECTED   Opiate, Ur Screen NONE DETECTED NONE DETECTED   Phencyclidine (PCP) Ur S NONE DETECTED NONE DETECTED   Cannabinoid 50 Ng, Ur [place] NONE DETECTED NONE DETECTED   Barbiturates, Ur Screen NONE DETECTED NONE DETECTED   Benzodiazepine, Ur Scrn NONE DETECTED NONE DETECTED   Methadone Scn, Ur NONE DETECTED NONE DETECTED    ____________________________________________  ____________________________________________  RADIOLOGY   ____________________________________________   PROCEDURES  Procedure(s) performed:  Procedures    Critical Care performed: no ____________________________________________   INITIAL IMPRESSION / ASSESSMENT AND PLAN / ED COURSE  Pertinent labs & imaging results that were available during my care of the patient were reviewed by me and considered in my medical decision making (see chart for details).   DDX: Psychosis, delirium, medication effect, noncompliance, polysubstance abuse, Si, Hi, depression   Apolinar JunesBrandon Michelle NasutiWayne Giannini is a 32 y.o. who presents to the ED with for evaluation of SI and AV hallucinations.  Patient has  psych history of schizophrenia.  Laboratory testing was ordered to evaluation for underlying electrolyte derangement or signs of underlying organic pathology to explain today's presentation.  Based on history and physical and laboratory evaluation, it appears that the patient's presentation is 2/2 underlying psychiatric disorder and will require further evaluation and management by inpatient psychiatry.  Patient was  made an IVC due to SI with plan and increasing paranoia.  Disposition pending psychiatric evaluation.       As part of my medical decision making, I reviewed the following data within the electronic MEDICAL RECORD NUMBER Nursing notes reviewed and incorporated, Labs reviewed, notes from prior ED visits.  ____________________________________________   FINAL CLINICAL IMPRESSION(S) / ED DIAGNOSES  Final diagnoses:  Suicidal ideations  Paranoia (HCC)      NEW MEDICATIONS STARTED DURING THIS VISIT:  New Prescriptions   No medications on file     Note:  This document was prepared using Dragon voice recognition software and may include unintentional dictation errors.    Willy Eddy, MD 05/04/18 [phone]

## 2018-05-04 NOTE — ED Notes (Signed)
Pt. Transferred to BHU from ED to room 2 after screening for contraband. Report to include Situation, Background, Assessment and Recommendations from Hewan RN. Pt. Oriented to unit including Q15 minute rounds as well as the security cameras for their protection. Patient is alert and oriented, warm and dry in no acute distress. Patient denies SI, HI, and AVH. Pt. Encouraged to let me know if needs arise. 

## 2018-05-04 NOTE — ED Notes (Signed)

## 2018-05-04 NOTE — ED Notes (Signed)
Hourly rounding reveals patient in room. No complaints, stable, in no acute distress. Q15 minute rounds and monitoring via Rover and Officer to continue.   

## 2018-05-04 NOTE — ED Notes (Signed)
Report to include Situation, Background, Assessment, and Recommendations received from RN. Patient alert and oriented, warm and dry, in no acute distress. Patient denies SI ,HI and pain. He reported  AVH. Patient made aware of Q15 minute rounds and Psychologist, counsellingover and Officer presence for their safety. Patient instructed to come to me with needs or concerns.

## 2018-05-04 NOTE — ED Notes (Signed)
Hourly rounding reveals patient sleeping in room. No complaints, stable, in no acute distress. Q15 minute rounds and monitoring via Security Cameras to continue. 

## 2018-05-04 NOTE — BH Assessment (Addendum)
Assessment Note  Anthony Luna is an 32 y.o. male. Who presents to the emergency department for psychiatric evaluation due to experiencing auditory and visual hallucinations. When asked to describe the specific nature of his hallucinations patient reports "I don't really remember but I did see people trying to hurt me." He states he continued to visualize himself being ran over buy a car, He reports on set of today. Patient reports that [facility]. Patient shares that he was previously convicted of second-degree murder as a Juvenile and consequently spent several years in prison. He's been unable to live independently since being released from prison 4 years ago. Per his report he has a history of schizophrenic disorder and was most recently admitted psychitrically here at [facility] in June of this year. He states that he has the history of suicidal thoughts and has in the past experienced auditory visual hallucinations however patient reports "it's been awhile." . Pt is guard but cooperatives. Significant thought blocking suspected.  He denies any suicidal plan or intent at this time and shares that he is not currently suicidal. He denied for use of any mood-altering substances and denies any medical complaints at this time. Patient states that he's active in treatment and is currently attending a day treatment program. He reports being compliant with medications.  Diagnosis: Schizophrenia, paranoid type  Past Medical History:  Past Medical History:  Diagnosis Date  . Depression    Since moving into new apartment 1 year ago  . None to low serum cortisol response with adrenocorticotrophic hormone (ACTH) stimulation test   . Schizophrenia, paranoid type (HCC)     History reviewed. No pertinent surgical history.  Family History: No family history on file.  Social History:  reports that he has been smoking cigarettes. He started smoking about  15 years ago. He has been smoking about 0.50 packs per day. He has quit using smokeless tobacco. He reports that he drinks alcohol. He reports that he has current or past drug history. Drug: Marijuana. Frequency: 4.00 times per week.  Additional Social History:  Alcohol / Drug Use Pain Medications: See PTA Prescriptions: See PTA Over the Counter: See PTA History of alcohol / drug use?: No history of alcohol / drug abuse(Denied)  CIWA: CIWA-Ar BP: 122/76 Pulse Rate: 93 COWS:    Allergies: No Known Allergies  Home Medications:  (Not in a hospital admission)  OB/GYN Status:  No LMP for male patient.  General Assessment Data [facility] of Assessment[facility] ED TTS Assessment: In system Is this a Tele or Face-to-Face Assessment?: Face-to-Face Is this an Initial Assessment or a Re-assessment for this encounter?: Initial Assessment Patient Accompanied by:: N/A Language Other than English: No Living Arrangements: In Group Home: (Comment: Name of Group Home) What gender do you identify as?: Male Marital status: Single Living Arrangements: Group Home Can pt return to current living arrangement?: Yes Admission Status: Involuntary Petitioner: ED Attending Is patient capable of signing voluntary admission?: No Referral Source: Self/Family/Friend  Medical Screening Exam [facility]) Medical Exam completed: Yes  Crisis Care Plan Living Arrangements: Group Home Legal Guardian: Other:(Denied ) Name of Psychiatrist: Contracted through Group Home  Name of Therapist: Contracted through Group Home   Education Status Is patient currently in school?: No Is the patient employed, unemployed or receiving disability?: Receiving disability income  Risk to self with the past 6 months Suicidal Ideation: No-Not Currently/Within Last 6 Months Has patient been a risk  to self within the past 6 months prior to admission? : Yes Suicidal Intent: No-Not Currently/Within Last 6 Months Has patient  had any suicidal intent within the past 6 months prior to admission? : No Is patient at risk for suicide?: No Has patient had any suicidal plan within the past 6 months prior to admission? : Yes Access to Means: Yes Specify Access to Suicidal Means: plan to walk into traffic  What has been your use of drugs/alcohol within the last 12 months?: denied  Previous Attempts/Gestures: No How many times?: 0 Other Self Harm Risks: 0 Intentional Self Injurious Behavior: None Family Suicide History: No Recent stressful life event(s): Conflict (Comment) Persecutory voices/beliefs?: Yes Depression: Yes Depression Symptoms: Fatigue, Isolating Substance abuse history and/or treatment for substance abuse?: No Suicide prevention information given to non-admitted patients: Yes  Risk to Others within the past 6 months Homicidal Ideation: No Does patient have any lifetime risk of violence toward others beyond the six months prior to admission? : No Thoughts of Harm to Others: No Current Homicidal Plan: No Access to Homicidal Means: No Identified Victim: n History of harm to others?: No Assessment of Violence: None Noted Violent Behavior Description: n Does patient have access to weapons?: No Criminal Charges Pending?: No Does patient have a court date: No Is patient on probation?: No  Psychosis Hallucinations: Auditory, Visual Delusions: Persecutory  Mental Status Report Appearance/Hygiene: In scrubs Eye Contact: Fair Motor Activity: Freedom of movement Speech: Soft Level of Consciousness: Alert Mood: Sad Affect: Anxious Anxiety Level: None Thought Processes: Coherent Judgement: Partial Orientation: Time, Situation, Place, Person Obsessive Compulsive Thoughts/Behaviors: None  Cognitive Functioning Concentration: Fair Memory: Remote Intact, Recent Intact Is patient IDD: No Insight: Poor Impulse Control: Fair Appetite: Good Have you had any weight changes? : No Change Sleep: No  Change Total Hours of Sleep: 6 Vegetative Symptoms: None  ADLScreening [facility] Assessment Services) Patient's cognitive ability adequate to safely complete daily activities?: Yes Patient able to express need for assistance with ADLs?: Yes Independently performs ADLs?: Yes (appropriate for developmental age)  Prior Inpatient Therapy Prior Inpatient Therapy: Yes Prior Therapy Dates: 12/2017 Prior Therapy Facilty/Provider(s): [facility] Reason for Treatment: SI  Prior Outpatient Therapy Prior Outpatient Therapy: No Does patient have an ACCT team?: No Does patient have Intensive In-House Services?  : No Does patient have [facility] services? : Unknown Does patient have P4CC services?: Unknown  ADL Screening (condition at time of admission) Patient's cognitive ability adequate to safely complete daily activities?: Yes Patient able to express need for assistance with ADLs?: Yes Independently performs ADLs?: Yes (appropriate for developmental age)       Abuse/Neglect Assessment (Assessment to be complete while patient is alone) Abuse/Neglect Assessment Can Be Completed: Yes Physical Abuse: Denies Verbal Abuse: Denies Sexual Abuse: Denies Exploitation of patient/patient's resources: Denies Values / Beliefs Cultural Requests During Hospitalization: None Spiritual Requests During Hospitalization: None Consults Spiritual Care Consult Needed: No Social Work Consult Needed: No Merchant navy officer (For Healthcare) Does Patient Have a Medical Advance Directive?: No Would patient like information on creating a medical advance directive?: No - Patient declined          Disposition:  Disposition Initial Assessment Completed for this Encounter: Yes Patient referred to: Other (Comment)(SOC Consult )  On Site Evaluation by:   Reviewed with Physician:    Asa Saunas 05/04/2018 9:56 PM

## 2018-05-05 ENCOUNTER — Other Ambulatory Visit: Payer: Self-pay

## 2018-05-05 ENCOUNTER — Inpatient Hospital Stay
Admission: AD | Admit: 2018-05-05 | Discharge: 2018-05-11 | DRG: 885 | Disposition: A | Discharge status: Home / Self Care | Payer: Medicaid Other | Attending: Psychiatry | Admitting: Psychiatry

## 2018-05-05 DIAGNOSIS — F250 Schizoaffective disorder, bipolar type: Principal | ICD-10-CM | POA: Diagnosis present

## 2018-05-05 DIAGNOSIS — R251 Tremor, unspecified: Secondary | ICD-10-CM | POA: Diagnosis present

## 2018-05-05 DIAGNOSIS — F200 Paranoid schizophrenia: Secondary | ICD-10-CM | POA: Diagnosis not present

## 2018-05-05 DIAGNOSIS — F17200 Nicotine dependence, unspecified, uncomplicated: Secondary | ICD-10-CM | POA: Diagnosis present

## 2018-05-05 DIAGNOSIS — Z5181 Encounter for therapeutic drug level monitoring: Secondary | ICD-10-CM | POA: Diagnosis not present

## 2018-05-05 MED ORDER — LITHIUM CARBONATE ER 450 MG PO TBCR
450.0000 mg | EXTENDED_RELEASE_TABLET | Freq: Two times a day (BID) | ORAL | Status: DC
  Administered 2018-05-05 – 2018-05-07 (×4): 450 mg via ORAL
  Filled 2018-05-05 (×4): qty 1

## 2018-05-05 MED ORDER — NICOTINE 21 MG/24HR TD PT24
MEDICATED_PATCH | TRANSDERMAL | Status: AC
  Filled 2018-05-05: qty 1

## 2018-05-05 MED ORDER — PROPRANOLOL HCL 20 MG PO TABS
10.0000 mg | ORAL_TABLET | Freq: Three times a day (TID) | ORAL | Status: DC
  Administered 2018-05-05 – 2018-05-11 (×16): 10 mg via ORAL
  Filled 2018-05-05 (×18): qty 1

## 2018-05-05 MED ORDER — AMANTADINE HCL 100 MG PO CAPS
100.0000 mg | ORAL_CAPSULE | Freq: Two times a day (BID) | ORAL | Status: DC
  Administered 2018-05-05 – 2018-05-06 (×2): 100 mg via ORAL
  Filled 2018-05-05 (×2): qty 1

## 2018-05-05 MED ORDER — ACETAMINOPHEN 325 MG PO TABS: 650.0000 mg | ORAL_TABLET | Freq: Four times a day (QID) | ORAL | Status: DC | PRN

## 2018-05-05 MED ORDER — PALIPERIDONE ER 3 MG PO TB24: 6.0000 mg | ORAL_TABLET | Freq: Every day | ORAL | Status: DC

## 2018-05-05 MED ORDER — HYDROXYZINE HCL 50 MG PO TABS
50.0000 mg | ORAL_TABLET | Freq: Three times a day (TID) | ORAL | Status: DC | PRN
  Administered 2018-05-06: 50 mg via ORAL
  Filled 2018-05-05: qty 1

## 2018-05-05 MED ORDER — MAGNESIUM HYDROXIDE 400 MG/5ML PO SUSP: 30.0000 mL | Freq: Every day | ORAL | Status: DC | PRN

## 2018-05-05 MED ORDER — ALUM & MAG HYDROXIDE-SIMETH 200-200-20 MG/5ML PO SUSP: 30.0000 mL | ORAL | Status: DC | PRN

## 2018-05-05 MED ORDER — NICOTINE 21 MG/24HR TD PT24
21.0000 mg | MEDICATED_PATCH | Freq: Every day | TRANSDERMAL | Status: DC
  Administered 2018-05-05: 21 mg via TRANSDERMAL

## 2018-05-05 MED ORDER — PALIPERIDONE ER 3 MG PO TB24
6.0000 mg | ORAL_TABLET | Freq: Every day | ORAL | Status: DC
  Administered 2018-05-05: 6 mg via ORAL
  Filled 2018-05-05: qty 2

## 2018-05-05 MED ORDER — TRAZODONE HCL 100 MG PO TABS
100.0000 mg | ORAL_TABLET | Freq: Every evening | ORAL | Status: DC | PRN
  Administered 2018-05-06: 100 mg via ORAL
  Filled 2018-05-05: qty 1

## 2018-05-05 NOTE — ED Notes (Signed)
Hourly rounding reveals patient sleeping in room. No complaints, stable, in no acute distress. Q15 minute rounds and monitoring via Security Cameras to continue. 

## 2018-05-05 NOTE — ED Notes (Signed)
MD [name] is currently at his bedside

## 2018-05-05 NOTE — Plan of Care (Signed)
Patient newly admitted to the unit, goals not yet met. 

## 2018-05-05 NOTE — Tx Team (Signed)
Initial Treatment Plan 05/05/2018 3:55 PM Anthony BarthelBrandon Wayne Rho MVH:[id number]    PATIENT STRESSORS: Financial difficulties Loss of father   PATIENT STRENGTHS: Motivation for treatment/growth Physical Health Supportive family/friends   PATIENT IDENTIFIED PROBLEMS: Auditory and visual hallucination  Lack of family support                   DISCHARGE CRITERIA:  Improved stabilization in mood, thinking, and/or behavior Motivation to continue treatment in a less acute level of care  PRELIMINARY DISCHARGE PLAN: Outpatient therapy Return to previous living arrangement  PATIENT/FAMILY INVOLVEMENT: This treatment plan has been presented to and reviewed with the patient, Anthony BarthelBrandon Wayne Luna, and/or family member.  The patient and family have been given the opportunity to ask questions and make suggestions.  Jim DesanctisYawn L Raeanna Soberanes, RN 05/05/2018, 3:55 PM

## 2018-05-05 NOTE — ED Notes (Signed)
BEHAVIORAL HEALTH ROUNDING Patient sleeping: No. Patient alert and oriented: yes Behavior appropriate: Yes.  ; If no, describe:  Nutrition and fluids offered: yes Toileting and hygiene offered: Yes  Sitter present: q15 minute observations and security camera monitoring Law enforcement present: Yes  ODS  

## 2018-05-05 NOTE — ED Provider Notes (Signed)
-----------------------------------------   8:12 AM on 05/05/2018 -----------------------------------------   Blood pressure 122/76, pulse 93, temperature 98.1 F (36.7 C), temperature source Oral, resp. rate 16, SpO2 98 %.  The patient had no acute events since last update.  Calm and cooperative at this time.  Disposition is pending Psychiatry/Behavioral Medicine team recommendations.     Arnaldo NatalMalinda, Denetra Formoso F, MD 05/05/18 [phone]

## 2018-05-05 NOTE — ED Notes (Signed)
Patient observed lying in bed with eyes closed  Even, unlabored respirations observed   NAD pt appears to be sleeping  I will continue to monitor along with every 15 minute visual observations and ongoing security monitoring    

## 2018-05-05 NOTE — ED Notes (Signed)
ED  Is the patient under IVC or is there intent for IVC: Yes.   Is the patient medically cleared: Yes.   Is there vacancy in the ED BHU: Yes.   Is the population mix appropriate for patient: Yes.   Is the patient awaiting placement in inpatient or outpatient setting: Yes.   Has the patient had a psychiatric consult: Yes.   Survey of unit performed for contraband, proper placement and condition of furniture, tampering with fixtures in bathroom, shower, and each patient room: Yes.  ; Findings:  APPEARANCE/BEHAVIOR Calm and cooperative NEURO ASSESSMENT Orientation: oriented x3  Denies pain Hallucinations  Pt verbalizing command auditory hallucinations   Speech: Normal Gait: normal RESPIRATORY ASSESSMENT Even  Unlabored respirations  CARDIOVASCULAR ASSESSMENT Pulses equal   regular rate  Skin warm and dry   GASTROINTESTINAL ASSESSMENT no GI complaint EXTREMITIES Full ROM  PLAN OF CARE Provide calm/safe environment. Vital signs assessed twice daily. ED BHU Assessment once each 12-hour shift. Collaborate with TTS daily or as condition indicates. Assure the ED provider has rounded once each shift. Provide and encourage hygiene. Provide redirection as needed. Assess for escalating behavior; address immediately and inform ED provider.  Assess family dynamic and appropriateness for visitation as needed: Yes.  ; If necessary, describe findings:  Educate the patient/family about BHU procedures/visitation: Yes.  ; If necessary, describe findings:

## 2018-05-05 NOTE — BH Assessment (Signed)
Patient is to be [facility] by Dr. Toni Amendlapacs.  Attending Physician will be Dr. Johnella MoloneyMcNew.   Patient [facility] room 303[facility]'Yawn.   Intake Paper Work has been signed and placed on patient chart.  ER staff is aware of the admission:  Misty StanleyLisa, ER Secretary    Dr. Darnelle CatalanMalinda, ER MD   Amy T, Patient's Nurse   Ethelene BrownsAnthony, Patient Access.

## 2018-05-05 NOTE — Consult Note (Signed)
[facility] Face-to-Face Psychiatry Consult   Reason for Consult: Consult for this 32-year-old man with a history of schizoaffective disorder who comes to the hospital seeking help for his mental health problem Referring Physician: Stafford Patient Identification: Anthony Luna MRN:  [id number] Principal Diagnosis: Schizoaffective disorder, bipolar type (HCC) Diagnosis:  Principal Problem:   Schizoaffective disorder, bipolar type (HCC)   Total Time spent with patient: 1 hour  Subjective:   Anthony Luna is a 32 y.o. male patient admitted with "I am having hallucinations and I need to get my meds adjusted".  HPI: Patient seen chart reviewed.  Patient familiar from previous contact.  32-year-old man with chronic mental health problems comes from his group home saying that he is having hallucinations.  He tells me that he is having auditory hallucinations.  They happen at least part of every day and are getting more irritating.  The hallucinations say things that are not necessarily threatening but are often just sort of peculiar which has been typical of his delusions and hallucinations in the past.  He also says he has gotten paranoid.  He says he had a conflict with one staff person at his group home and now he believes that she is trying to poison him.  He has at least partial insight that this is a symptom of psychosis but is still bothered by it.  Patient says he is sleeping fine at night.  Appetite is okay.  Denies any medical problems.  He proudly points out to me that he has been off of alcohol and all drugs since this past July and overall he thinks he is doing okay at the group home.  He says his outpatient psychiatrist recently discontinued his clozapine.  He is not sure what medicine was given to him to replace it.  Looks from the notes like it could be long-acting Invega  Social history: Patient is currently living in a group home.  Living independently had been difficult for him.   He says he is pretty content with his situation.  He does have a guardian.  Does not have much family to stay in touch with.  Medical history: Patient is in good health does not have significant medical problems outside of the mental health issues  Substance abuse history: Used to have a problem with abuse of marijuana which clearly made his symptoms worse.  He tells me that he has stayed off it ever since getting into the group home.  Past Psychiatric History: Patient has a history of schizoaffective disorder.  Several prior hospitalizations.  No history of suicide attempts.  No history of violence.  He does however get paranoid easily and starts having hallucinations.  He is been on multiple medicines in the past and was doing well on lithium and clozapine last time I knew.  Apparently he has been taken off his clozapine now.  Risk to Self: Suicidal Ideation: No-Not Currently/Within Last 6 Months Suicidal Intent: No-Not Currently/Within Last 6 Months Is patient at risk for suicide?: No Access to Means: Yes Specify Access to Suicidal Means: plan to walk into traffic  What has been your use of drugs/alcohol within the last 12 months?: denied  How many times?: 0 Other Self Harm Risks: 0 Intentional Self Injurious Behavior: None Risk to Others: Homicidal Ideation: No Thoughts of Harm to Others: No Current Homicidal Plan: No Access to Homicidal Means: No Identified Victim: n History of harm to others?: No Assessment of Violence: None Noted Violent Behavior Description: n   Does patient have access to weapons?: No Criminal Charges Pending?: No Does patient have a court date: No Prior Inpatient Therapy: Prior Inpatient Therapy: Yes Prior Therapy Dates: 12/2017 Prior Therapy Facilty/Provider(s): [facility] Reason for Treatment: SI Prior Outpatient Therapy: Prior Outpatient Therapy: No Does patient have an ACCT team?: No Does patient have Intensive In-House Services?  : No Does patient have  [facility] services? : Unknown Does patient have P4CC services?: Unknown  Past Medical History:  Past Medical History:  Diagnosis Date  . Depression    Since moving into new apartment 1 year ago  . None to low serum cortisol response with adrenocorticotrophic hormone (ACTH) stimulation test   . Schizophrenia, paranoid type (HCC)    History reviewed. No pertinent surgical history. Family History: No family history on file. Family Psychiatric  History: Does not know of any Social History:  Social History   Substance and Sexual Activity  Alcohol Use Yes  . Alcohol/week: 0.0 standard drinks   Comment: "Special occasions"     Social History   Substance and Sexual Activity  Drug Use Yes  . Frequency: 4.0 times per week  . Types: Marijuana   Comment: "2 blunts"    Social History   Socioeconomic History  . Marital status: Single    Spouse name: Not on file  . Number of children: Not on file  . Years of education: Not on file  . Highest education level: Not on file  Occupational History  . Not on file  Social Needs  . Financial resource strain: Not on file  . Food insecurity:    Worry: Not on file    Inability: Not on file  . Transportation needs:    Medical: Not on file    Non-medical: Not on file  Tobacco Use  . Smoking status: Current Every Day Smoker    Packs/day: 0.50    Types: Cigarettes    Start date: 11/29/2002  . Smokeless tobacco: Former User  . Tobacco comment: does not want to quit  Substance and Sexual Activity  . Alcohol use: Yes    Alcohol/week: 0.0 standard drinks    Comment: "Special occasions"  . Drug use: Yes    Frequency: 4.0 times per week    Types: Marijuana    Comment: "2 blunts"  . Sexual activity: Not on file  Lifestyle  . Physical activity:    Days per week: Not on file    Minutes per session: Not on file  . Stress: Not on file  Relationships  . Social connections:    Talks on phone: Not on file    Gets together: Not on file     Attends religious service: Not on file    Active member of club or organization: Not on file    Attends meetings of clubs or organizations: Not on file    Relationship status: Not on file  Other Topics Concern  . Not on file  Social History Narrative  . Not on file   Additional Social History:    Allergies:  No Known Allergies  Labs:  Results for orders placed or performed during the hospital encounter of 05/04/18 (from the past 48 hour(s))  Comprehensive metabolic panel     Status: Abnormal   Collection Time: 05/04/18  6:16 PM  Result Value Ref Range   Sodium 138 135 - 145 mmol/L   Potassium 3.9 3.5 - 5.1 mmol/L   Chloride 108 98 - 111 mmol/L   CO2 24 22 - 32   mmol/L   Glucose, Bld 89 70 - 99 mg/dL   BUN 16 6 - 20 mg/dL   Creatinine, Ser 1.33 (H) 0.61 - 1.24 mg/dL   Calcium 9.4 8.9 - 10.3 mg/dL   Total Protein 7.6 6.5 - 8.1 g/dL   Albumin 4.4 3.5 - 5.0 g/dL   AST 33 15 - 41 U/L   ALT 44 0 - 44 U/L   Alkaline Phosphatase 66 38 - 126 U/L   Total Bilirubin 0.8 0.3 - 1.2 mg/dL   GFR calc non Af Amer >60 >60 mL/min   GFR calc Af Amer >60 >60 mL/min    Comment: (NOTE) The eGFR has been calculated using the CKD EPI equation. This calculation has not been validated in all clinical situations. eGFR's persistently <60 mL/min signify possible Chronic Kidney Disease.    Anion gap 6 5 - 15    Comment: Performed at [facility], [address] [place], [place] [address]  Ethanol     Status: None   Collection Time: 05/04/18  6:16 PM  Result Value Ref Range   Alcohol, Ethyl (B) <10 <10 mg/dL    Comment: (NOTE) Lowest detectable limit for serum alcohol is 10 mg/dL. For medical purposes only. Performed at [facility], [address] [place], [place] [address]   cbc     Status: None   Collection Time: 05/04/18  6:16 PM  Result Value Ref Range   WBC 9.9 4.0 - 10.5 K/uL   RBC 5.26 4.22 - 5.81 MIL/uL   Hemoglobin 16.6 13.0 - 17.0 g/dL   HCT 48.5 39.0 - 52.0  %   MCV 92.2 80.0 - 100.0 fL   MCH 31.6 26.0 - 34.0 pg   MCHC 34.2 30.0 - 36.0 g/dL   RDW 13.2 11.5 - 15.5 %   Platelets 197 150 - 400 K/uL   nRBC 0.0 0.0 - 0.2 %    Comment: Performed at [facility], [address] [place],  [address]  Urine Drug Screen, Qualitative     Status: None   Collection Time: 05/04/18  6:16 PM  Result Value Ref Range   Tricyclic, Ur Screen NONE DETECTED NONE DETECTED   Amphetamines, Ur Screen NONE DETECTED NONE DETECTED   MDMA (Ecstasy)Ur Screen NONE DETECTED NONE DETECTED   Cocaine Metabolite,Ur [place] NONE DETECTED NONE DETECTED   Opiate, Ur Screen NONE DETECTED NONE DETECTED   Phencyclidine (PCP) Ur S NONE DETECTED NONE DETECTED   Cannabinoid 50 Ng, Ur [place] NONE DETECTED NONE DETECTED   Barbiturates, Ur Screen NONE DETECTED NONE DETECTED   Benzodiazepine, Ur Scrn NONE DETECTED NONE DETECTED   Methadone Scn, Ur NONE DETECTED NONE DETECTED    Comment: (NOTE) Tricyclics + metabolites, urine    Cutoff 1000 ng/mL Amphetamines + metabolites, urine  Cutoff 1000 ng/mL MDMA (Ecstasy), urine              Cutoff 500 ng/mL Cocaine Metabolite, urine          Cutoff 300 ng/mL Opiate + metabolites, urine        Cutoff 300 ng/mL Phencyclidine (PCP), urine         Cutoff 25 ng/mL Cannabinoid, urine                 Cutoff 50 ng/mL Barbiturates + metabolites, urine  Cutoff 200 ng/mL Benzodiazepine, urine              Cutoff 200 ng/mL Methadone, urine                     Cutoff 300 ng/mL The urine drug screen provides only a preliminary, unconfirmed analytical test result and should not be used for non-medical purposes. Clinical consideration and professional judgment should be applied to any positive drug screen result due to possible interfering substances. A more specific alternate chemical method must be used in order to obtain a confirmed analytical result. Gas chromatography / mass spectrometry (GC/MS) is the preferred confirmat ory  method. Performed at [facility], [address] [place], [place] [address]     No current facility-administered medications for this encounter.    Current Outpatient Medications  Medication Sig Dispense Refill  . amantadine (SYMMETREL) 100 MG capsule Take 1 capsule (100 mg total) by mouth 2 (two) times daily. 60 capsule 0  . clozapine (CLOZARIL) 50 MG tablet Take 3.5 tablets (175 mg total) by mouth at bedtime. 105 tablet 0  . lithium carbonate (ESKALITH) 450 MG CR tablet Take 1 tablet (450 mg total) by mouth 2 (two) times daily. 60 tablet 0  . Paliperidone Palmitate (INVEGA TRINZA) 819 MG/2.625ML SUSP Inject 819 mg into the muscle.    . propranolol (INDERAL) 10 MG tablet Take 1 tablet (10 mg total) by mouth 3 (three) times daily. 90 tablet 0    Musculoskeletal: Strength & Muscle Tone: within normal limits Gait & Station: normal Patient leans: N/A  Psychiatric Specialty Exam: Physical Exam  Nursing note and vitals reviewed. Constitutional: He appears well-developed and well-nourished.  HENT:  Head: Normocephalic and atraumatic.  Eyes: Pupils are equal, round, and reactive to light. Conjunctivae are normal.  Neck: Normal range of motion.  Cardiovascular: Regular rhythm and normal heart sounds.  Respiratory: Effort normal. No respiratory distress.  GI: Soft.  Musculoskeletal: Normal range of motion.  Neurological: He is alert.  Skin: Skin is warm and dry.  Psychiatric: His speech is normal. Judgment normal. His mood appears anxious. He is hyperactive. He is not aggressive and not combative. Thought content is paranoid. Cognition and memory are impaired. He expresses no homicidal and no suicidal ideation.    Review of Systems  Constitutional: Negative.   HENT: Negative.   Eyes: Negative.   Respiratory: Negative.   Cardiovascular: Negative.   Gastrointestinal: Negative.   Musculoskeletal: Negative.   Skin: Negative.   Neurological: Negative.    Psychiatric/Behavioral: Positive for hallucinations. Negative for depression, memory loss, substance abuse and suicidal ideas. The patient is nervous/anxious. The patient does not have insomnia.     Blood pressure 123/70, pulse 91, temperature 98 F (36.7 C), temperature source Oral, resp. rate 17, SpO2 98 %.There is no height or weight on file to calculate BMI.  General Appearance: Fairly Groomed  Eye Contact:  Good  Speech:  Clear and Coherent  Volume:  Normal  Mood:  Anxious  Affect:  Constricted  Thought Process:  Goal Directed  Orientation:  Full (Time, Place, and Person)  Thought Content:  Hallucinations: Auditory, Paranoid Ideation and Rumination  Suicidal Thoughts:  No  Homicidal Thoughts:  No  Memory:  Immediate;   Fair Recent;   Fair Remote;   Fair  Judgement:  Fair  Insight:  Fair  Psychomotor Activity:  Restlessness and Patient paces a lot.  From what I remember this is a typical habit of his.  I am not sure if it really represents akathisia or not.  He denies feeling uncomfortable for it and he is able to stand still indefinitely to have an interview but then goes back to pacing  Concentration:  Concentration: Fair  Recall:  Fair  Fund of Knowledge:  Fair  Language:  Fair  Akathisia:  No  Handed:  Right  AIMS (if indicated):     Assets:  Desire for Improvement Housing Physical Health Resilience  ADL's:  Intact  Cognition:  Impaired,  Mild  Sleep:        Treatment Plan Summary: Daily contact with patient to assess and evaluate symptoms and progress in treatment, Medication management and Plan 32-year-old man with chronic psychotic disorder comes to the emergency room reporting that he is again having hallucinations and feeling paranoid.  Patient has reasonably good insight into his symptoms and knows that he needs to get his medicines adjusted.  Because of worsening psychotic symptoms he meets criteria for inpatient hospitalization.  Patient agrees to plan.  I  will go ahead and put in orders to admit him downstairs.  I am going to put him on a little bit of Invega at night since it looks like that might be his current medicine.  Leave that to the treatment team to decide about going back to clozapine.  Full labs reviewed.  Disposition: Recommend psychiatric Inpatient admission when medically cleared. Supportive therapy provided about ongoing stressors.   , MD 05/05/2018 12:52 PM 

## 2018-05-05 NOTE — ED Notes (Signed)
Hourly rounding reveals patient in room talking to SOC. No complaints, stable, in no acute distress. Q15 minute rounds and monitoring via Security Cameras to continue. 

## 2018-05-05 NOTE — Progress Notes (Signed)
32 year old male admitted to unit. Denies SI/HI and pain but endorses auditory hallucinations at this time telling him "Just be yourself." Patient reports that current stressors are the loss of his father in 2008 and the fact that his mother is not in his life and "I keep hearing and seeing things. Skin and contraband search completed and witnessed by Franki CabotBarbara B., RN. Tattoo to upper L) arm observed, skin otherwise warm, dry and intact. Oriented patient to room and unit.  No contraband found on patient nor his belongings. Admission assessment completed, fluid and nutrition offered. Patient remains safe on the unit with q 15 minute checks.

## 2018-05-05 NOTE — ED Notes (Signed)

## 2018-05-06 DIAGNOSIS — Z5181 Encounter for therapeutic drug level monitoring: Secondary | ICD-10-CM

## 2018-05-06 DIAGNOSIS — F250 Schizoaffective disorder, bipolar type: Principal | ICD-10-CM

## 2018-05-06 LAB — CBC WITH DIFFERENTIAL/PLATELET
Abs Immature Granulocytes: 0.03 10*3/uL (ref 0.00–0.07)
Basophils Absolute: 0 10*3/uL (ref 0.0–0.1)
Basophils Relative: 1 %
EOS PCT: 4 %
Eosinophils Absolute: 0.3 10*3/uL (ref 0.0–0.5)
HCT: 49.7 % (ref 39.0–52.0)
HEMOGLOBIN: 16.4 g/dL (ref 13.0–17.0)
Immature Granulocytes: 0 %
LYMPHS ABS: 2.2 10*3/uL (ref 0.7–4.0)
LYMPHS PCT: 26 %
MCH: 31.2 pg (ref 26.0–34.0)
MCHC: 33 g/dL (ref 30.0–36.0)
MCV: 94.7 fL (ref 80.0–100.0)
MONO ABS: 0.9 10*3/uL (ref 0.1–1.0)
MONOS PCT: 10 %
Neutro Abs: 5 10*3/uL (ref 1.7–7.7)
Neutrophils Relative %: 59 %
Platelets: 216 10*3/uL (ref 150–400)
RBC: 5.25 MIL/uL (ref 4.22–5.81)
RDW: 13 % (ref 11.5–15.5)
WBC: 8.4 10*3/uL (ref 4.0–10.5)
nRBC: 0 % (ref 0.0–0.2)

## 2018-05-06 LAB — LIPID PANEL
Cholesterol: 173 mg/dL (ref 0–200)
HDL: 39 mg/dL — AB (ref >40)
LDL CALC: 108 mg/dL — AB (ref 0–99)
TRIGLYCERIDES: 132 mg/dL (ref <150)
Total CHOL/HDL Ratio: 4.4 RATIO
VLDL: 26 mg/dL (ref 0–40)

## 2018-05-06 LAB — HEMOGLOBIN A1C
Hgb A1c MFr Bld: 5 % (ref 4.8–5.6)
Mean Plasma Glucose: 96.8 mg/dL

## 2018-05-06 LAB — LITHIUM LEVEL: Lithium Lvl: 0.4 mmol/L — ABNORMAL LOW (ref 0.60–1.20)

## 2018-05-06 LAB — TSH: TSH: 3.369 u[IU]/mL (ref 0.350–4.500)

## 2018-05-06 MED ORDER — HALOPERIDOL 5 MG PO TABS
5.0000 mg | ORAL_TABLET | Freq: Four times a day (QID) | ORAL | Status: DC | PRN
  Filled 2018-05-06: qty 1

## 2018-05-06 MED ORDER — LORAZEPAM 1 MG PO TABS
1.0000 mg | ORAL_TABLET | Freq: Four times a day (QID) | ORAL | Status: DC | PRN
  Administered 2018-05-06: 1 mg via ORAL
  Filled 2018-05-06: qty 1

## 2018-05-06 MED ORDER — CLOZAPINE 100 MG PO TABS
300.0000 mg | ORAL_TABLET | Freq: Every day | ORAL | Status: DC
  Administered 2018-05-06: 300 mg via ORAL
  Filled 2018-05-06: qty 3

## 2018-05-06 NOTE — Progress Notes (Signed)
Clozapine monitoring Consult   32 yo male ordered clozapine 300 mg PO at bedtime with consult for pharmacy to monitor.  05/06/2018 ANC 5000  Information entered into Clozapine registry and pt eligible to receive clozapine Next labs due in a week - ordered for 05/13/2018  Pharmacy will continue to follow.   [place]aldron LabsKaren K Adysen Raphael, RPh 05/06/2018 3:15 PM

## 2018-05-06 NOTE — Progress Notes (Signed)
Recreation Therapy Notes  Date: 11/ 21/19  Time: 9:30 am  Location: Craft Room  Behavioral response: Appropriate   Intervention Topic: Emotions  Discussion/Intervention:  Group content on today was focused on emotions. The group identified what emotions are and why it is important to have emotions. Patients expressed some positive and negative emotions. Individuals gave some past experiences on how they normally dealt with emotions in the past. The group described some positive ways to deal with emotions in the future. Patients participated in the intervention "Name the Megan SalonSong" where individuals were given a chance to experience different emotions.  Clinical Observations/Feedback:  Patient came to group and defined emotions are spiritual and peaceful. He stated he normally exercises to deal with his emotions. Participant expressed that he feel many negative emotions at times but does not want to feel that way.  Individual was social with peers and staff while participating in the intervention.  Corley Maffeo LRT/CTRS        Brighten Buzzelli 05/06/2018 1:00 PM

## 2018-05-06 NOTE — Plan of Care (Signed)
Problem: Activity: Goal: Interest or engagement in leisure activities will improve Outcome: Progressing   Problem: Health Behavior/Discharge Planning: Goal: Compliance with therapeutic regimen will improve Outcome: Progressing   Problem: Safety: Goal: Ability to disclose and discuss suicidal ideas will improve Outcome: Progressing DAR Note: Pt A & O to self, place, day of week and events leading to admission. Visible in dayroom on initial approach.  Presents anxious/fidgety and irritable on  interactions.  Denies SI, HI, VH and pain "not right now". Endorsed +AH Per pt "I'm hearing voices, it stays with me, they keep telling me to breathe, breathe". Reports he slept well last night with good appetite. Observed in scheduled groups and was engaged. Assigned psychiatrist notified of pt's complain +AH and agitation/irritability. New order received for PRN Haldol & Ativan (see Emar). Compliant with medications when offered. Denies side effects. Emotional support offered to pt. Writer encouraged pt to voice concerns, attend to ADLs and comply with current treatment regimen including groups. Scheduled and PRN medications given per MD's orders with verbal education and effects monitored. Safety checks maintained at Q 15 minutes intervals.  Pt has been redirectable, receptive with care. Cooperative with unit routines. Declined Haldol this evening when offered "I don't want it, it makes me act different when its coming out of my system, I feel like people after me, the voices gets worse then I start seeing things". Denies concerns at this time. Remains safe on and off unit.

## 2018-05-06 NOTE — [facility] Group Notes (Signed)
LCSW Group Therapy Note   05/06/2018 1:00 pm   Type of Therapy and Topic:  Group Therapy:  Overcoming Obstacles   Participation Level:  Active   Description of Group:    In this group patients will be encouraged to explore what they see as obstacles to their own wellness and recovery. They will be guided to discuss their thoughts, feelings, and behaviors related to these obstacles. The group will process together ways to cope with barriers, with attention given to specific choices patients can make. Each patient will be challenged to identify changes they are motivated to make in order to overcome their obstacles. This group will be process-oriented, with patients participating in exploration of their own experiences as well as giving and receiving support and challenge from other group members.   Therapeutic Goals: 1. Patient will identify personal and current obstacles as they relate to admission. 2. Patient will identify barriers that currently interfere with their wellness or overcoming obstacles.  3. Patient will identify feelings, thought process and behaviors related to these barriers. 4. Patient will identify two changes they are willing to make to overcome these obstacles:      Summary of Patient Progress Anthony Luna actively participated in today's group discussion on overcoming obstacles.  Anthony Luna shared that "always having visual hallucinations" is an obstacle that he faces.  Anthony Luna shared that medication that he is taking for his hallucinations not working is a barrier to his wellness and that one feeling that he experiences that is related to this barrier is anxious.  Anthony Luna shared that he will continue to take medication and work with his psychiatrist as a way to overcome his obstacles.     Therapeutic Modalities:   Cognitive Behavioral Therapy Solution Focused Therapy Motivational Interviewing Relapse Prevention Therapy  Anthony FrameSonya S Kalief Kattner, LCSW 05/06/2018 3:07 PM

## 2018-05-06 NOTE — Plan of Care (Signed)
Pt. Complaint with medications. Pt. Denies si/hi, verbally is able to contract for safety. Pt. Verbalizing feeling and mood appropriately. Pt. Participation and socializing appropriate. Pt. Reports he can remain safe while on the unit.   Problem: Activity: Goal: Interest or engagement in leisure activities will improve Outcome: Progressing   Problem: Coping: Goal: Will verbalize feelings Outcome: Progressing   Problem: Health Behavior/Discharge Planning: Goal: Compliance with therapeutic regimen will improve Outcome: Progressing   Problem: Safety: Goal: Ability to disclose and discuss suicidal ideas will improve Outcome: Progressing   Problem: Safety: Goal: Ability to remain free from injury will improve Outcome: Progressing

## 2018-05-06 NOTE — H&P (Addendum)
Psychiatric Admission Assessment Adult  Patient Identification: Anthony Luna MRN:  [id number] Date of Evaluation:  05/06/2018 Chief Complaint:  Worsening paranoia and AH, Vh Principal Diagnosis: Schizoaffective disorder, bipolar type (HCC) Diagnosis:  Principal Problem:   Schizoaffective disorder, bipolar type (HCC)  History of Present Illness: 32 yo male admitted due to worsening VH and AH. Pt is known to this provider from previous admissions. His last admission here was in June in which Clozapine was increased at that visit. He has been doing relatively well since that admission. HE moved in a group home which he was desiring to do. He states that he has been in the group home since July and does like it there. HE likes the structure. He states that he has been having more Paranoia and " thinking that people are poisoning my food and putting things in it." He has insight into the fact that he does not think this is true. HE states that he was having VH of "people stabbing me." He states tat he got into an argument at the group home and this is when things started worsening. He states that he sometimes hears negative voices but overall they are positive and say th ings such as "Just breath and be yourself." HE states that he tends to pace to distract himself but is nervous that "people thing this is weird."  Pt is a bit more hyperreligious this admission than I have seen him. HE is very calm and polite which is usually himself. His affect is happy and smiling. HE is going to groups and he wanted to show me his homework that he did after group identifying emotions. He is a little disorganized at times but able to redirect. HE states that he does feel anxious at times and states that his doctor took him off his anxiety medications that we started. He is not sure which one. Looking at the records the only change has been a decrease in dose of Propranolol to once a day. He does have CST with  [employer] and has a peer support through them named, [name] and he likes her a lot. He has stopped smoking mariajuana and UDS is negative. We discussed how great of an accomplishment is and he is very proud of himself for being able to do this. Pt is his own guardian.   I did speak with group home owner [name]. She states that he has been a bit more irritable and agitated lately. He mentioned that he was having hallucinations that people were stabbing him and putting poison in his food.   Associated Signs/Symptoms: Depression Symptoms:  depressed mood, difficulty concentrating, (Hypo) Manic Symptoms:  Distractibility, Anxiety Symptoms:  Excessive Worry, Psychotic Symptoms:  Hallucinations: Auditory Visual PTSD Symptoms: Negative Total Time spent with patient: 45 minutes  Past Psychiatric History: History of schizoaffective disorder. He has had multiple hospitalizations with last one in June. He reports one suicide attempt by "banging my head on the floor a long time ago." He has had multiple medication trials. He was started on Clozapine during an admission in April. He follows with CST through [employer]. He does not have a guardian. He has a peer support, [name].   Is the patient at risk to self? No.  Has the patient been a risk to self in the past 6 months? No.  Has the patient been a risk to self within the distant past? No.  Is the patient a risk to others? No.  Has the  patient been a risk to others in the past 6 months? No.  Has the patient been a risk to others within the distant past? No.   Alcohol Screening: Patient refused Alcohol Screening Tool: Yes 1. How often do you have a drink containing alcohol?: Never 2. How many drinks containing alcohol do you have on a typical day when you are drinking?: 1 or 2 3. How often do you have six or more drinks on one occasion?: Never AUDIT-C Score: 0 4. How often during the last year have you found that you were not able to stop  drinking once you had started?: Never 5. How often during the last year have you failed to do what was normally expected from you becasue of drinking?: Never 6. How often during the last year have you needed a first drink in the morning to get yourself going after a heavy drinking session?: Never 7. How often during the last year have you had a feeling of guilt of remorse after drinking?: Never 8. How often during the last year have you been unable to remember what happened the night before because you had been drinking?: Never 9. Have you or someone else been injured as a result of your drinking?: No 10. Has a relative or friend or a doctor or another health worker been concerned about your drinking or suggested you cut down?: No Alcohol Use Disorder Identification Test Final Score (AUDIT): 0 Intervention/Follow-up: AUDIT Score <7 follow-up not indicated Substance Abuse History in the last 12 months:  No. Consequences of Substance Abuse: Negative Previous Psychotropic Medications: Yes  Psychological Evaluations: Yes  Past Medical History:  Past Medical History:  Diagnosis Date  . Depression    Since moving into new apartment 1 year ago  . None to low serum cortisol response with adrenocorticotrophic hormone (ACTH) stimulation test   . Schizophrenia, paranoid type (HCC)    History reviewed. No pertinent surgical history. Family History: History reviewed. No pertinent family history. Family Psychiatric  History: Unknown Tobacco Screening: Have you used any form of tobacco in the last 30 days? (Cigarettes, Smokeless Tobacco, Cigars, and/or Pipes): Yes Tobacco use, Select all that apply: 5 or more cigarettes per day Are you interested in Tobacco Cessation Medications?: Yes, will notify MD for an order Counseled patient on smoking cessation including recognizing danger situations, developing coping skills and basic information about quitting provided: Refused/Declined practical  counseling Social History: Pt has been in a group home since July. Previously was living independently. He has CST team who checks on him regularly.  Social History   Substance and Sexual Activity  Alcohol Use Yes  . Alcohol/week: 0.0 standard drinks   Comment: "Special occasions"     Social History   Substance and Sexual Activity  Drug Use Yes  . Frequency: 4.0 times per week  . Types: Marijuana   Comment: "2 blunts"    Additional Social History:      Pain Medications: See PTA Prescriptions: See PTA Over the Counter: See PTA History of alcohol / drug use?: No history of alcohol / drug abuse Longest period of sobriety (when/how long): unknown Negative Consequences of Use: Financial, Personal relationships, Work / School Withdrawal Symptoms: Other (Comment)(none)                    Allergies:  No Known Allergies Lab Results:  Results for orders placed or performed during the hospital encounter of 05/05/18 (from the past 48 hour(s))  Hemoglobin A1c  Status: None   Collection Time: 05/06/18  7:19 AM  Result Value Ref Range   Hgb A1c MFr Bld 5.0 4.8 - 5.6 %    Comment: (NOTE) Pre diabetes:          5.7%-6.4% Diabetes:              >6.4% Glycemic control for   <7.0% adults with diabetes    Mean Plasma Glucose 96.8 mg/dL    Comment: Performed at [facility], [address], [place], [place] [address]  Lipid panel     Status: Abnormal   Collection Time: 05/06/18  7:19 AM  Result Value Ref Range   Cholesterol 173 0 - 200 mg/dL   Triglycerides 604 <540 mg/dL   HDL 39 (L) >98 mg/dL   Total CHOL/HDL Ratio 4.4 RATIO   VLDL 26 0 - 40 mg/dL   LDL Cholesterol 119 (H) 0 - 99 mg/dL    Comment:        Total Cholesterol/HDL:CHD Risk Coronary Heart Disease Risk Table                     Men   Women  1/2 Average Risk   3.4   3.3  Average Risk       5.0   4.4  2 X Average Risk   9.6   7.1  3 X Average Risk  23.4   11.0        Use the calculated Patient  Ratio above and the CHD Risk Table to determine the patient's CHD Risk.        ATP III CLASSIFICATION (LDL):  <100     mg/dL   Optimal  147-829  mg/dL   Near or Above                    Optimal  130-159  mg/dL   Borderline  562-130  mg/dL   High  >865     mg/dL   Very High Performed at [facility], [address] [place], [place] [address]   TSH     Status: None   Collection Time: 05/06/18  7:19 AM  Result Value Ref Range   TSH 3.369 0.350 - 4.500 uIU/mL    Comment: Performed by a 3rd Generation assay with a functional sensitivity of <=0.01 uIU/mL. Performed at [facility], [address] [place], [place] [address]   Lithium level     Status: Abnormal   Collection Time: 05/06/18  7:20 AM  Result Value Ref Range   Lithium Lvl 0.40 (L) 0.60 - 1.20 mmol/L    Comment: Performed at [facility], [address] [place], [place] [address]    Blood Alcohol level:  Lab Results  Component Value Date   [facility] <10 05/04/2018   ETH <10 11/24/2017    Metabolic Disorder Labs:  Lab Results  Component Value Date   HGBA1C 5.0 05/06/2018   MPG 96.8 05/06/2018   MPG 93.93 11/25/2017   Lab Results  Component Value Date   PROLACTIN 6.1 12/19/2014   PROLACTIN 1.3 (L) 11/30/2014   Lab Results  Component Value Date   CHOL 173 05/06/2018   TRIG 132 05/06/2018   HDL 39 (L) 05/06/2018   CHOLHDL 4.4 05/06/2018   VLDL 26 05/06/2018   LDLCALC 108 (H) 05/06/2018   LDLCALC 93 11/25/2017    Current Medications: Current Facility-Administered Medications  Medication Dose Route Frequency Provider Last Rate Last Dose  .  acetaminophen (TYLENOL) tablet 650 mg  650 mg Oral Q6H PRN [name], [name], MD      . alum & mag hydroxide-simeth (MAALOX/MYLANTA) 200-200-20 MG/5ML suspension 30 mL  30 mL Oral Q4H PRN [name], [name], MD      . amantadine (SYMMETREL) capsule 100 mg  100 mg Oral BID [name], [name], MD   100 mg at 05/06/18 0823  . cloZAPine (CLOZARIL) tablet  300 mg  300 mg Oral QHS Elyanna Wallick R, MD      . hydrOXYzine (ATARAX/VISTARIL) tablet 50 mg  50 mg Oral TID PRN [name], [name], MD      . lithium carbonate (ESKALITH) CR tablet 450 mg  450 mg Oral BID [name], [name], MD   450 mg at 05/06/18 0823  . magnesium hydroxide (MILK OF MAGNESIA) suspension 30 mL  30 mL Oral Daily PRN [name], [name], MD      . paliperidone (INVEGA) 24 hr tablet 6 mg  6 mg Oral QHS [name], [name], MD   6 mg at 05/05/18 2212  . propranolol (INDERAL) tablet 10 mg  10 mg Oral TID [name], [name], MD   10 mg at 05/06/18 1238  . traZODone (DESYREL) tablet 100 mg  100 mg Oral QHS PRN [name], [name], MD       PTA Medications: Medications Prior to Admission  Medication Sig Dispense Refill Last Dose  . amantadine (SYMMETREL) 100 MG capsule Take 1 capsule (100 mg total) by mouth 2 (two) times daily. 60 capsule 0   . clozapine (CLOZARIL) 50 MG tablet Take 3.5 tablets (175 mg total) by mouth at bedtime. 105 tablet 0   . lithium carbonate (ESKALITH) 450 MG CR tablet Take 1 tablet (450 mg total) by mouth 2 (two) times daily. 60 tablet 0   . Paliperidone Palmitate (INVEGA TRINZA) 819 MG/2.625ML SUSP Inject 819 mg into the muscle.   Past Month at Unknown time  . propranolol (INDERAL) 10 MG tablet Take 1 tablet (10 mg total) by mouth 3 (three) times daily. 90 tablet 0     Musculoskeletal: Strength & Muscle Tone: within normal limits Gait & Station: normal Patient leans: N/A  Psychiatric Specialty Exam: Physical Exam  Nursing note and vitals reviewed.   Review of Systems  All other systems reviewed and are negative.   Blood pressure 131/79, pulse 85, temperature 98 F (36.7 C), temperature source Oral, resp. rate 16, SpO2 100 %.There is no height or weight on file to calculate BMI.  General Appearance: Casual  Eye Contact:  Good  Speech:  Clear and Coherent  Volume:  Normal  Mood:  Euthymic  Affect:  Congruent  Thought Process:  Disorganized at times  Orientation:   Full (Time, Place, and Person)  Thought Content:  Hallucinations: Auditory Visual and Paranoid Ideation  Suicidal Thoughts:  No  Homicidal Thoughts:  No  Memory:  Immediate;   Fair  Judgement:  Fair  Insight:  Fair  Psychomotor Activity:  Normal  Concentration:  Concentration: Fair  Recall:  [employer] of Knowledge:  Fair  Language:  Fair  Akathisia:  No      Assets:  Resilience  ADL's:  Intact  Cognition:  WNL  Sleep:  Number of Hours: 5    Treatment Plan Summary: 32 yo male admitted due to worsening hallucinations and paranoia. He is known to this provider from previous admissions. HE has been relatively stable on Clozapine since it was started in April. HE is  calm and very polite. HE reports VH and paranoia and actually has really good insight that he does not feel that these things are actually happening but are distressing nonetheless. I did request records from [facility] and he is still on Clozapine and dose was increased to 300 mg qhs last month. He is also on [place].   Plan:  Schizoaffective disorder -Restart Clozapine 300 mg qhs. Will check level. Consider increasing dose. Monitor ANC -Eyvonne Mechanic. Next dose due 05/19/18 -Restart Lithium 450 mg BID. Level 0.4 -Check EKG -will add prn Haldol for AH and agitation  Tremor/akathisia -will stop symmetrel as this can sometimes worsen psychosis and he is also on propranolol which can help with tremor (tremor is not noticeable on exam) -Continue propranolol but increase dose back to 10 mg TID for anxiety and akathisia  Dispo -Return back to group home when stable. Pt DOES NOT have guardian. He has CST with [employer].       Observation Level/Precautions:  15 minute checks  Laboratory:  Monitor CBC  Psychotherapy:    Medications:    Consultations:    Discharge Concerns:    Estimated LOS: 5-7 days  Other:     Physician Treatment Plan for Primary Diagnosis: Schizoaffective disorder, bipolar type  (HCC) Long Term Goal(s): Improvement in symptoms so as ready for discharge  Short Term Goals: Ability to demonstrate self-control will improve  I certify that inpatient services furnished can reasonably be expected to improve the patient's condition.    [name], MD 11/21/201912:54 PM

## 2018-05-06 NOTE — Progress Notes (Signed)
D: Pt denies SI/HI/AVH, verbally is able to contract for safety. Pt is pleasant and cooperative, engages actively during assessments. Pt. has no Complaints.  Patient Interactions appropriate. Pt. Socializing with peers playing cards appropriately. No behavioral concerns to report. No reports of responding to internal stimuli.    A: Q x 15 minute observation checks were completed for safety. Patient was provided with education, but needs reinforcement.  Patient was given/offered medications per orders. Patient  was encourage to attend groups, participate in unit activities and continue with plan of care. Pt. Chart and plans of care reviewed. Pt. Given support and encouragement.   R: Patient is complaint with medication and unit procedures.             Precautionary checks every 15 minutes for safety maintained, room free of safety hazards, patient sustains no injury or falls during this shift. Will endorse care to next shift.

## 2018-05-06 NOTE — Plan of Care (Signed)
Stayed in the milieu with peers. Cooperative and pleasant

## 2018-05-06 NOTE — Progress Notes (Signed)
Patient stayed in the dayroom with peers. Pleasant and cooperative.  Stayed in the milieu and went to his room at bedtime. Received medications in room and complained of intense voices "that  tell me to do the right thing". Patient said he was trying to get some sleep to get away from the voices. Avoided to discuss it with staff. Patient fell asleep and appears to be comfortable in bed. Safety monitored at Q15 mn level of observation.

## 2018-05-07 LAB — CREATININE, SERUM
CREATININE: 1.45 mg/dL — AB (ref 0.61–1.24)
GFR calc Af Amer: >60 mL/min (ref >60)

## 2018-05-07 MED ORDER — TRAZODONE HCL 50 MG PO TABS
50.0000 mg | ORAL_TABLET | Freq: Every evening | ORAL | Status: DC | PRN
  Administered 2018-05-07 – 2018-05-08 (×2): 50 mg via ORAL
  Filled 2018-05-07 (×2): qty 1

## 2018-05-07 MED ORDER — BUPROPION HCL ER (XL) 150 MG PO TB24
150.0000 mg | ORAL_TABLET | Freq: Every day | ORAL | Status: DC
  Administered 2018-05-08 – 2018-05-11 (×4): 150 mg via ORAL
  Filled 2018-05-07 (×4): qty 1

## 2018-05-07 MED ORDER — CLOZAPINE 100 MG PO TABS
325.0000 mg | ORAL_TABLET | Freq: Every day | ORAL | Status: DC
  Administered 2018-05-07 – 2018-05-09 (×3): 325 mg via ORAL
  Filled 2018-05-07 (×3): qty 3

## 2018-05-07 MED ORDER — NICOTINE 14 MG/24HR TD PT24
14.0000 mg | MEDICATED_PATCH | Freq: Every day | TRANSDERMAL | Status: DC
  Administered 2018-05-07 – 2018-05-09 (×2): 14 mg via TRANSDERMAL
  Filled 2018-05-07 (×4): qty 1

## 2018-05-07 NOTE — Progress Notes (Signed)
Recreation Therapy Notes  INPATIENT RECREATION THERAPY ASSESSMENT  Patient Details Name: Anthony Luna MRN: [id number] DOB: 07/11/1985 Today's Date: 05/07/2018       Information Obtained From: Patient  Able to Participate in Assessment/Interview: Yes  Patient Presentation: Responsive  Reason for Admission (Per Patient): Other (Comments)(Medication not working)  Patient Stressors:    Coping Skills:   Other (Comment)(Playing cards, walking)  Leisure Interests (2+):  Music - Listen, Sports - Basketball  Frequency of Recreation/Participation: Monthly  Awareness of Community Resources:  Yes  Community Resources:  Recreation Center  Current Use:    If no, Barriers?:    Expressed Interest in [employer] CorporationCommunity Resource Information:    [place]  Patient Main Form of Transportation: Other (Comment)(Peer support)  Patient Strengths:  Good listener  Patient Identified Areas of Improvement:  my attitude  Patient Goal for Hospitalization:  Understanding I can get the help I need when I come here  Current SI (including self-harm):  No  Current HI:  No  Current AVH: No  Staff Intervention Plan: Group Attendance, Collaborate with Interdisciplinary Treatment Team  Consent to Intern Participation: N/A  Tensley Wery 05/07/2018, 2:21 PM

## 2018-05-07 NOTE — Tx Team (Addendum)
Interdisciplinary Treatment and Diagnostic Plan Update  05/07/2018 Time of Session: 1030am Anthony Luna MRN: [id number]  Principal Diagnosis: Schizoaffective disorder, bipolar type Anthony Luna(HCC)  Secondary Diagnoses: Principal Problem:   Schizoaffective disorder, bipolar type (HCC)   Current Medications:  Current Facility-Administered Medications  Medication Dose Route Frequency Provider Last Rate Last Dose  . acetaminophen (TYLENOL) tablet 650 mg  650 mg Oral Q6H PRN [name], [name], MD      . alum & mag hydroxide-simeth (MAALOX/MYLANTA) 200-200-20 MG/5ML suspension 30 mL  30 mL Oral Q4H PRN [name], [name], MD      . Melene Muller[START ON 05/08/2018] buPROPion (WELLBUTRIN XL) 24 hr tablet 150 mg  150 mg Oral Daily [name], [name], MD      . cloZAPine (CLOZARIL) tablet 300 mg  300 mg Oral QHS [name], Ileene HutchinsonHolly R, MD   300 mg at 05/06/18 2123  . LORazepam (ATIVAN) tablet 1 mg  1 mg Oral Q6H PRN Haskell RilingMcNew, [name], MD   1 mg at 05/06/18 1746  . magnesium hydroxide (MILK OF MAGNESIA) suspension 30 mL  30 mL Oral Daily PRN [name], [name], MD      . propranolol (INDERAL) tablet 10 mg  10 mg Oral TID [name], [name], MD   10 mg at 05/07/18 0836  . traZODone (DESYREL) tablet 50 mg  50 mg Oral QHS PRN [name], Ileene HutchinsonHolly R, MD       PTA Medications: Medications Prior to Admission  Medication Sig Dispense Refill Last Dose  . amantadine (SYMMETREL) 100 MG capsule Take 1 capsule (100 mg total) by mouth 2 (two) times daily. 60 capsule 0   . clozapine (CLOZARIL) 50 MG tablet Take 3.5 tablets (175 mg total) by mouth at bedtime. 105 tablet 0   . lithium carbonate (ESKALITH) 450 MG CR tablet Take 1 tablet (450 mg total) by mouth 2 (two) times daily. 60 tablet 0   . Paliperidone Palmitate (INVEGA TRINZA) 819 MG/2.625ML SUSP Inject 819 mg into the muscle.   Past Month at Unknown time  . propranolol (INDERAL) 10 MG tablet Take 1 tablet (10 mg total) by mouth 3 (three) times daily. 90 tablet 0     Patient Stressors: Financial  difficulties Loss of father  Patient Strengths: Motivation for treatment/growth Physical Health Supportive family/friends  Treatment Modalities: Medication Management, Group therapy, Case management,  1 to 1 session with clinician, Psychoeducation, Recreational therapy.   Physician Treatment Plan for Primary Diagnosis: Schizoaffective disorder, bipolar type (HCC) Long Term Goal(s): Improvement in symptoms so as ready for discharge   Short Term Goals: Ability to demonstrate self-control will improve  Medication Management: Evaluate patient's response, side effects, and tolerance of medication regimen.  Therapeutic Interventions: 1 to 1 sessions, Unit Group sessions and Medication administration.  Evaluation of Outcomes: Progressing  Physician Treatment Plan for Secondary Diagnosis: Principal Problem:   Schizoaffective disorder, bipolar type (HCC)  Long Term Goal(s): Improvement in symptoms so as ready for discharge   Short Term Goals: Ability to demonstrate self-control will improve     Medication Management: Evaluate patient's response, side effects, and tolerance of medication regimen.  Therapeutic Interventions: 1 to 1 sessions, Unit Group sessions and Medication administration.  Evaluation of Outcomes: Progressing   RN Treatment Plan for Primary Diagnosis: Schizoaffective disorder, bipolar type (HCC) Long Term Goal(s): Knowledge of disease and therapeutic regimen to maintain health will improve  Short Term Goals: Ability to verbalize feelings will improve, Ability to identify and develop effective coping behaviors will improve and Compliance  with prescribed medications will improve  Medication Management: RN will administer medications as ordered by provider, will assess and evaluate patient's response and provide education to patient for prescribed medication. RN will report any adverse and/or side effects to prescribing provider.  Therapeutic Interventions: 1 on 1  counseling sessions, Psychoeducation, Medication administration, Evaluate responses to treatment, Monitor vital signs and CBGs as ordered, Perform/monitor CIWA, COWS, AIMS and Fall Risk screenings as ordered, Perform wound care treatments as ordered.  Evaluation of Outcomes: Progressing   LCSW Treatment Plan for Primary Diagnosis: Schizoaffective disorder, bipolar type (HCC) Long Term Goal(s): Safe transition to appropriate next level of care at discharge, Engage patient in therapeutic group addressing interpersonal concerns.  Short Term Goals: Engage patient in aftercare planning with referrals and resources, Increase social support, Increase emotional regulation, Facilitate acceptance of mental health diagnosis and concerns and Increase skills for wellness and recovery  Therapeutic Interventions: Assess for all discharge needs, 1 to 1 time with Social worker, Explore available resources and support systems, Assess for adequacy in community support network, Educate family and significant other(s) on suicide prevention, Complete Psychosocial Assessment, Interpersonal group therapy.  Evaluation of Outcomes: Progressing   Progress in Treatment: Attending groups: Yes. Participating in groups: Yes. Taking medication as prescribed: Yes. Toleration medication: Yes. Family/Significant other contact made: No, will contact:  Anthony Luna Patient understands diagnosis: Yes. Discussing patient identified problems/goals with staff: Yes. Medical problems stabilized or resolved: Yes. Denies suicidal/homicidal ideation: Yes. Issues/concerns per patient self-inventory: No. Other: NA  New problem(s) identified: No, Describe:  Pt did not attend meeting, was sleeping  New Short Term/Long Term Goal(s): Pt did not attend meeting  Patient Goals:  Pt did not attend meeting  Discharge Plan or Barriers: Pt will return home and follow up with outpatient treatment  Reason for Continuation of Hospitalization:  Medication stabilization  Estimated Length of Stay:5-7 days  Recreational Therapy: Patient Stressors: N/A Patient Goal: Patient will engage in interactions with peers and staff in pro-social manner at least 2x within 5 recreation therapy group sessions  Attendees: Patient:Did not attend 05/07/2018 12:08 PM  Physician: Corinna Gab, MD 05/07/2018 12:08 PM  Nursing: Milas Hock, RN 05/07/2018 12:08 PM  RN Care Manager: 05/07/2018 12:08 PM  Social Worker: Lowella Dandy, LCSW 05/07/2018 12:08 PM  Recreational Therapist: Danella Deis. Dreama Saa, LRT 05/07/2018 12:08 PM  Other: Huey Romans, LCSW 05/07/2018 12:08 PM  Other:  05/07/2018 12:08 PM  Other: 05/07/2018 12:08 PM    Scribe for Treatment Team: Suzan Slick, LCSW 05/07/2018 12:08 PM

## 2018-05-07 NOTE — Progress Notes (Signed)
Recreation Therapy Notes  Date: 05/07/2018  Time: 9:30 am   Location: Craft room   Behavioral response: N/A   Intervention Topic: Communication  Discussion/Intervention: Patient did not attend group.   Clinical Observations/Feedback:  Patient did not attend group.   Issa Luster LRT/CTRS        Tracey Stewart 05/07/2018 10:28 AM 

## 2018-05-07 NOTE — Progress Notes (Signed)
[facility] MD Progress Note  05/07/2018 12:57 PM Anthony Luna  MRN:  [id number] Subjective:  Pt was quite sedated this morning. HE did get trazodone last night which may have been contributing. He was more awake later in the morning. He states that the voices were loud yesterday but they are quieter and less distressing today. He states that he paces to try to quiet them down. He is a bit somatic at times and worried about his health but with reassurance and reviewing labs together he feels better. He has not been agitated on this unit. He is a little bit disorganized at times but redirectable.   Principal Problem: Schizoaffective disorder, bipolar type ([place]) Diagnosis: Principal Problem:   Schizoaffective disorder, bipolar type ([place])  Total Time spent with patient: 20 minutes  Past Psychiatric History: See h&P  Past Medical History:  Past Medical History:  Diagnosis Date  . Depression    Since moving into new apartment 1 year ago  . None to low serum cortisol response with adrenocorticotrophic hormone (ACTH) stimulation test   . Schizophrenia, paranoid type ([place])    History reviewed. No pertinent surgical history. Family History: History reviewed. No pertinent family history. Family Psychiatric  History: See H&P Social History:  Social History   Substance and Sexual Activity  Alcohol Use Yes  . Alcohol/week: 0.0 standard drinks   Comment: "Special occasions"     Social History   Substance and Sexual Activity  Drug Use Yes  . Frequency: 4.0 times per week  . Types: Marijuana   Comment: "2 blunts"    Social History   Socioeconomic History  . Marital status: Single    Spouse name: Not on file  . Number of children: Not on file  . Years of education: Not on file  . Highest education level: Not on file  Occupational History  . Not on file  Social Needs  . Financial resource strain: Not on file  . Food insecurity:    Worry: Not on file    Inability: Not on file  .  Transportation needs:    Medical: Not on file    Non-medical: Not on file  Tobacco Use  . Smoking status: Current Every Day Smoker    Packs/day: 0.50    Types: Cigarettes    Start date: 11/29/2002  . Smokeless tobacco: Former Systems developer  . Tobacco comment: does not want to quit  Substance and Sexual Activity  . Alcohol use: Yes    Alcohol/week: 0.0 standard drinks    Comment: "Special occasions"  . Drug use: Yes    Frequency: 4.0 times per week    Types: Marijuana    Comment: "2 blunts"  . Sexual activity: Not on file  Lifestyle  . Physical activity:    Days per week: Not on file    Minutes per session: Not on file  . Stress: Not on file  Relationships  . Social connections:    Talks on phone: Not on file    Gets together: Not on file    Attends religious service: Not on file    Active member of club or organization: Not on file    Attends meetings of clubs or organizations: Not on file    Relationship status: Not on file  Other Topics Concern  . Not on file  Social History Narrative  . Not on file   Additional Social History:    Pain Medications: See PTA Prescriptions: See PTA Over the Counter: See PTA History  of alcohol / drug use?: No history of alcohol / drug abuse Longest period of sobriety (when/how long): unknown Negative Consequences of Use: Financial, Personal relationships, Work / School Withdrawal Symptoms: Other (Comment)(none)                    Sleep: Good  Appetite:  Good  Current Medications: Current Facility-Administered Medications  Medication Dose Route Frequency Provider Last Rate Last Dose  . acetaminophen (TYLENOL) tablet 650 mg  650 mg Oral Q6H PRN [name], [name], MD      . alum & mag hydroxide-simeth (MAALOX/MYLANTA) 200-200-20 MG/5ML suspension 30 mL  30 mL Oral Q4H PRN [name], [name], MD      . Derrill Memo ON 05/08/2018] buPROPion (WELLBUTRIN XL) 24 hr tablet 150 mg  150 mg Oral Daily Sosha Shepherd R, MD      . cloZAPine (CLOZARIL)  tablet 300 mg  300 mg Oral QHS Paelyn Smick, Tyson Babinski, MD   300 mg at 05/06/18 2123  . LORazepam (ATIVAN) tablet 1 mg  1 mg Oral Q6H PRN Marylin Crosby, MD   1 mg at 05/06/18 1746  . magnesium hydroxide (MILK OF MAGNESIA) suspension 30 mL  30 mL Oral Daily PRN [name], [name], MD      . propranolol (INDERAL) tablet 10 mg  10 mg Oral TID [name], [name], MD   10 mg at 05/07/18 1228  . traZODone (DESYREL) tablet 50 mg  50 mg Oral QHS PRN Jodelle Fausto, Tyson Babinski, MD        Lab Results:  Results for orders placed or performed during the hospital encounter of 05/05/18 (from the past 48 hour(s))  Hemoglobin A1c     Status: None   Collection Time: 05/06/18  7:19 AM  Result Value Ref Range   Hgb A1c MFr Bld 5.0 4.8 - 5.6 %    Comment: (NOTE) Pre diabetes:          5.7%-6.4% Diabetes:              >6.4% Glycemic control for   <7.0% adults with diabetes    Mean Plasma Glucose 96.8 mg/dL    Comment: Performed at [facility], [place] [address], [place], [place] [address]  Lipid panel     Status: Abnormal   Collection Time: 05/06/18  7:19 AM  Result Value Ref Range   Cholesterol 173 0 - 200 mg/dL   Triglycerides 132 <150 mg/dL   HDL 39 (L) >40 mg/dL   Total CHOL/HDL Ratio 4.4 RATIO   VLDL 26 0 - 40 mg/dL   LDL Cholesterol 108 (H) 0 - 99 mg/dL    Comment:        Total Cholesterol/HDL:CHD Risk Coronary Heart Disease Risk Table                     Men   Women  1/2 Average Risk   3.4   3.3  Average Risk       5.0   4.4  2 X Average Risk   9.6   7.1  3 X Average Risk  23.4   11.0        Use the calculated Patient Ratio above and the CHD Risk Table to determine the patient's CHD Risk.        ATP III CLASSIFICATION (LDL):  <100     mg/dL   Optimal  100-129  mg/dL   Near or Above  Optimal  130-159  mg/dL   Borderline  160-189  mg/dL   High  >190     mg/dL   Very High Performed at [facility], [address] [place], [place] [address]   TSH     Status: None    Collection Time: 05/06/18  7:19 AM  Result Value Ref Range   TSH 3.369 0.350 - 4.500 uIU/mL    Comment: Performed by a 3rd Generation assay with a functional sensitivity of <=0.01 uIU/mL. Performed at [facility], [address] [place], [place] [address]   Lithium level     Status: Abnormal   Collection Time: 05/06/18  7:20 AM  Result Value Ref Range   Lithium Lvl 0.40 (L) 0.60 - 1.20 mmol/L    Comment: Performed at [facility], [address] [place], [place] [address]  CBC with Differential/Platelet     Status: None   Collection Time: 05/06/18  7:20 AM  Result Value Ref Range   WBC 8.4 4.0 - 10.5 K/uL   RBC 5.25 4.22 - 5.81 MIL/uL   Hemoglobin 16.4 13.0 - 17.0 g/dL   HCT 49.7 39.0 - 52.0 %   MCV 94.7 80.0 - 100.0 fL   MCH 31.2 26.0 - 34.0 pg   MCHC 33.0 30.0 - 36.0 g/dL   RDW 13.0 11.5 - 15.5 %   Platelets 216 150 - 400 K/uL   nRBC 0.0 0.0 - 0.2 %   Neutrophils Relative % 59 %   Neutro Abs 5.0 1.7 - 7.7 K/uL   Lymphocytes Relative 26 %   Lymphs Abs 2.2 0.7 - 4.0 K/uL   Monocytes Relative 10 %   Monocytes Absolute 0.9 0.1 - 1.0 K/uL   Eosinophils Relative 4 %   Eosinophils Absolute 0.3 0.0 - 0.5 K/uL   Basophils Relative 1 %   Basophils Absolute 0.0 0.0 - 0.1 K/uL   Immature Granulocytes 0 %   Abs Immature Granulocytes 0.03 0.00 - 0.07 K/uL    Comment: Performed at [facility], [address] [place], [place] [address]  Creatinine, serum     Status: Abnormal   Collection Time: 05/07/18 10:53 AM  Result Value Ref Range   Creatinine, Ser 1.45 (H) 0.61 - 1.24 mg/dL   GFR calc non Af Amer >60 >60 mL/min   GFR calc Af Amer >60 >60 mL/min    Comment: (NOTE) The eGFR has been calculated using the CKD EPI equation. This calculation has not been validated in all clinical situations. eGFR's persistently <60 mL/min signify possible Chronic Kidney Disease. Performed at [facility], [address] [place], [place] [address]      Blood Alcohol level:  Lab Results  Component Value Date   [facility] <10 05/04/2018   ETH <10 51/76/1607    Metabolic Disorder Labs: Lab Results  Component Value Date   HGBA1C 5.0 05/06/2018   MPG 96.8 05/06/2018   MPG 93.93 11/25/2017   Lab Results  Component Value Date   PROLACTIN 6.1 12/19/2014   PROLACTIN 1.3 (L) 11/30/2014   Lab Results  Component Value Date   CHOL 173 05/06/2018   TRIG 132 05/06/2018   HDL 39 (L) 05/06/2018   CHOLHDL 4.4 05/06/2018   VLDL 26 05/06/2018   LDLCALC 108 (H) 05/06/2018   LDLCALC 93 11/25/2017    Physical Findings: AIMS: Facial and Oral Movements Muscles of Facial Expression: None, normal Lips and Perioral Area: None, normal Jaw: None, normal Tongue: None, normal,Extremity Movements Upper (  arms, wrists, hands, fingers): None, normal Lower (legs, knees, ankles, toes): None, normal, Trunk Movements Neck, shoulders, hips: None, normal, Overall Severity Severity of abnormal movements (highest score from questions above): None, normal Incapacitation due to abnormal movements: None, normal Patient's awareness of abnormal movements (rate only patient's report): No Awareness, Dental Status Current problems with teeth and/or dentures?: No Does patient usually wear dentures?: No  CIWA:    COWS:     Musculoskeletal: Strength & Muscle Tone: within normal limits Gait & Station: normal Patient leans: N/A  Psychiatric Specialty Exam: Physical Exam  Nursing note and vitals reviewed.   Review of Systems  All other systems reviewed and are negative.   Blood pressure 126/76, pulse 98, temperature 97.6 F (36.4 C), temperature source Oral, resp. rate 16, SpO2 98 %.There is no height or weight on file to calculate BMI.  General Appearance: Casual  Eye Contact:  Fair  Speech:  Clear and Coherent  Volume:  Normal  Mood:  Euthymic  Affect:  Flat  Thought Process:  Disorganized  Orientation:  Full (Time, Place, and Person)  Thought Content:   Hallucinations: Auditory  Suicidal Thoughts:  No  Homicidal Thoughts:  No  Memory:  Immediate;   Fair Recent;   Fair  Judgement:  Fair  Insight:  Fair  Psychomotor Activity:  Normal  Concentration:  Concentration: Fair  Recall:  [employer] of Knowledge:  Fair  Language:  Fair  Akathisia:  No      Assets:  Resilience  ADL's:  Intact  Cognition:  WNL  Sleep:  Number of Hours: 6.45     Treatment Plan Summary: 32 yo male admitted due worsening AH and paranoia. HE was quite sedated this morning likely from trazodone as his clozapine was not changed last night. HE is very vague with symptoms but continues to report AH.   Plan;  Schizoaffective disorder -Increase Clozapine to 325 mg qhs.  -He is on [place]. Next dose due 05/19/18 -Creatinine is elevated. Recheck today was 1.4. He has had elevated cre intermittently in the past. Possibly from Lithium. Will stop lithium for now. He may not need this medciation since he is on both Clozapine and Invega which both can help with mood stabilization.  -stop prn trazodone as he is over sedated today -Ekg with QTc of 415 -Stop prn haldol per pt request as he states that it makes symptoms worse -Per MAR from group home, he is also on Wellbutrin XL 150 mg daily. Will restart this for now  Tremor/akathisia Symmetrel stopped as this can sometimes worsen psychosis. he is also on propranolol which can help with tremor (tremor is not noticeable on exam) -Continue propranolol 10 mg TID  Dispo -Return to group home on discharge. Pt DOES NOT have guardian. He has CST with [facility].  Marylin Crosby, MD 05/07/2018, 12:57 PM

## 2018-05-07 NOTE — Plan of Care (Addendum)
Patient found pacing in hallway upon my arrival. Patient had a visitor later in the evening. Mood remains subdued but affect is slightly brighter and more animated. Continues to be pre[facility] but reports some improvement. Denies SI/HI. Denies pain. Eating and voiding adequately. Processing is slow and education reinforcement is necessary.  Compliant with HS medications and staff direction. Given Trazodone for sleep. Will monitor for efficacy. Q 15 minute checks maintained. Will continue to monitor throughout the shift. Patient slept 6.5 hours. No apparent distress. Will endorse care to oncoming shift.  Problem: Education: Goal: Utilization of techniques to improve thought processes will improve Outcome: Progressing   Problem: Activity: Goal: Imbalance in normal sleep/wake cycle will improve Outcome: Progressing   Problem: Coping: Goal: Coping ability will improve Outcome: Progressing   Problem: Coping: Goal: Will verbalize feelings Outcome: Not Progressing
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
## 2018-05-07 NOTE — Progress Notes (Signed)
Pt awake and visible in milieu majority of this shift. Presents with blunted affect and anxious mood. Denies SI, HI, VH and pain. Continues to endorse + AH "I still hear the voices but it's not loud like yesterday".  Pt has been apprehensive, suspicious / paranoid related to care "I don't trust lot of doctors and nurses because they give me medicines that cause me liver problem". Took his medications with increased encouragement. Declined group this AM despite multiple prompts. Continued support and encouragement provided to pt. Scheduled medications administered as ordered with verbal education and effects monitored. Safety checks maintained without incident.  Pt tolerates all PO intake well. Remains safe on and off unit. Denies concerns at this time.

## 2018-05-07 NOTE — BHH Suicide Risk Assessment (Signed)
BHH INPATIENT:  Family/Significant Other Suicide Prevention Education  Suicide Prevention Education:  Contact Attempts: Maris BergerGwendolyn [name], owner of [facility] group [facility] [phone] has been identified by the patient as the family member/significant other with whom the patient will be residing, and identified as the person(s) who will aid the patient in the event of a mental health crisis.  With written consent from the patient, two attempts were made to provide suicide prevention education, prior to and/or following the patient's discharge.  We were unsuccessful in providing suicide prevention education.  A suicide education pamphlet was given to the patient to share with family/significant other.  Date and time of first attempt: 05-07-18 130pm Date and time of second attempt:  Aerik Polan T Leatha GildingLIVINGSTON 05/07/2018, 1:32 PM
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
## 2018-05-08 NOTE — [facility] Group Notes (Signed)
LCSW Group Therapy Note  05/08/2018 1:15pm  Type of Therapy and Topic:  Group Therapy:  Healthy Self Image and Positive Change  Participation Level:  Active   Description of Group:  In this group, patients will compare and contrast their current "I am...." statements to the visions they identify as desirable for their lives.  Patients discuss fears and how they can make positive changes in their cognitions that will positively impact their behaviors.  Facilitator played a motivational 3-minute speech and patients were left with the task of thinking about what "I am...." statements they can start using in their lives immediately.  Therapeutic Goals: 1. Patient will state their current self-perception as expressed in an "I Am" statement 2. Patient will contrast this with their desired vision for their live 3. Patient will identify 3 fears that negatively impact their behavior 4. Patient will discuss cognitive distortions that stem from their fears 5. Patient will verbalize statements that challenge their cognitive distortions  Summary of Patient Progress:  The patient reported that he feels "anxious." The patient stated, "I am adaptable." Patient discussed his/her fears and how he can make positive changes in their cognitions that will positively impact his behaviors. Patient was able to discuss and process cognitive distortions that stem from his fears. Patient actively and appropriately engaged in the group. Patient was able to provide support and validation to other group members. Patient practiced active listening when interacting with the facilitator and other group members.   Therapeutic Modalities Cognitive Behavioral Therapy Motivational Interviewing  Shyann Hefner  CUEBAS-COLON, LCSW 05/08/2018 12:06 PM

## 2018-05-08 NOTE — Progress Notes (Signed)
[facility] MD Progress Note  05/08/2018 1:20 PM Anthony Luna  MRN:  [id number] Subjective: Follow-up for this patient was schizoaffective disorder.  Came into the hospital seeming paranoid and confused and a little agitated.  Today I found the patient in bed.  He was awake but sleepy.  He tells me he is feeling fine and has no complaints.  Denies psychotic symptoms.  Seems withdrawn and kind of in another world though.  He was smiling and sort of blissful way.  He did tell me that he felt like he was a little over sleepy.  Did not have any specific complaints however.  No results back on the clozapine level.  Creatinine stable. Principal Problem: Schizoaffective disorder, bipolar type ([place]) Diagnosis: Principal Problem:   Schizoaffective disorder, bipolar type ([place])  Total Time spent with patient: 30 minutes  Past Psychiatric History: Patient has a history of schizoaffective disorder with recurrent presentations with psychotic symptoms.  Past Medical History:  Past Medical History:  Diagnosis Date  . Depression    Since moving into new apartment 1 year ago  . None to low serum cortisol response with adrenocorticotrophic hormone (ACTH) stimulation test   . Schizophrenia, paranoid type ([place])    History reviewed. No pertinent surgical history. Family History: History reviewed. No pertinent family history. Family Psychiatric  History: See previous note Social History:  Social History   Substance and Sexual Activity  Alcohol Use Yes  . Alcohol/week: 0.0 standard drinks   Comment: "Special occasions"     Social History   Substance and Sexual Activity  Drug Use Yes  . Frequency: 4.0 times per week  . Types: Marijuana   Comment: "2 blunts"    Social History   Socioeconomic History  . Marital status: Single    Spouse name: Not on file  . Number of children: Not on file  . Years of education: Not on file  . Highest education level: Not on file  Occupational History  . Not on  file  Social Needs  . Financial resource strain: Not on file  . Food insecurity:    Worry: Not on file    Inability: Not on file  . Transportation needs:    Medical: Not on file    Non-medical: Not on file  Tobacco Use  . Smoking status: Current Every Day Smoker    Packs/day: 0.50    Types: Cigarettes    Start date: 11/29/2002  . Smokeless tobacco: Former Systems developer  . Tobacco comment: does not want to quit  Substance and Sexual Activity  . Alcohol use: Yes    Alcohol/week: 0.0 standard drinks    Comment: "Special occasions"  . Drug use: Yes    Frequency: 4.0 times per week    Types: Marijuana    Comment: "2 blunts"  . Sexual activity: Not on file  Lifestyle  . Physical activity:    Days per week: Not on file    Minutes per session: Not on file  . Stress: Not on file  Relationships  . Social connections:    Talks on phone: Not on file    Gets together: Not on file    Attends religious service: Not on file    Active member of club or organization: Not on file    Attends meetings of clubs or organizations: Not on file    Relationship status: Not on file  Other Topics Concern  . Not on file  Social History Narrative  . Not on file  Additional Social History:    Pain Medications: See PTA Prescriptions: See PTA Over the Counter: See PTA History of alcohol / drug use?: No history of alcohol / drug abuse Longest period of sobriety (when/how long): unknown Negative Consequences of Use: Financial, Personal relationships, Work / School Withdrawal Symptoms: Other (Comment)(none)                    Sleep: Fair  Appetite:  Fair  Current Medications: Current Facility-Administered Medications  Medication Dose Route Frequency Provider Last Rate Last Dose  . acetaminophen (TYLENOL) tablet 650 mg  650 mg Oral Q6H PRN ,  T, MD      . alum & mag hydroxide-simeth (MAALOX/MYLANTA) 200-200-20 MG/5ML suspension 30 mL  30 mL Oral Q4H PRN ,  T, MD      .  buPROPion (WELLBUTRIN XL) 24 hr tablet 150 mg  150 mg Oral Daily [name], [name], MD   150 mg at 05/08/18 0829  . cloZAPine (CLOZARIL) tablet 325 mg  325 mg Oral QHS [name], [name], MD   325 mg at 05/07/18 2142  . LORazepam (ATIVAN) tablet 1 mg  1 mg Oral Q6H PRN [name], [name], MD   1 mg at 05/06/18 1746  . magnesium hydroxide (MILK OF MAGNESIA) suspension 30 mL  30 mL Oral Daily PRN ,  T, MD      . nicotine (NICODERM CQ - dosed in mg/24 hours) patch 14 mg  14 mg Transdermal Daily [name], [name], MD   14 mg at 05/07/18 1914  . propranolol (INDERAL) tablet 10 mg  10 mg Oral TID ,  T, MD   10 mg at 05/08/18 1224  . traZODone (DESYREL) tablet 50 mg  50 mg Oral QHS PRN [name], [name], MD   50 mg at 05/07/18 2142    Lab Results:  Results for orders placed or performed during the hospital encounter of 05/05/18 (from the past 48 hour(s))  Creatinine, serum     Status: Abnormal   Collection Time: 05/07/18 10:53 AM  Result Value Ref Range   Creatinine, Ser 1.45 (H) 0.61 - 1.24 mg/dL   GFR calc non Af Amer >60 >60 mL/min   GFR calc Af Amer >60 >60 mL/min    Comment: (NOTE) The eGFR has been calculated using the CKD EPI equation. This calculation has not been validated in all clinical situations. eGFR's persistently <60 mL/min signify possible Chronic Kidney Disease. Performed at [facility], [address] [place], [place] [address]     Blood Alcohol level:  Lab Results  Component Value Date   ETH <10 05/04/2018   ETH <10 11/24/2017    Metabolic Disorder Labs: Lab Results  Component Value Date   HGBA1C 5.0 05/06/2018   MPG 96.8 05/06/2018   MPG 93.93 11/25/2017   Lab Results  Component Value Date   PROLACTIN 6.1 12/19/2014   PROLACTIN 1.3 (L) 11/30/2014   Lab Results  Component Value Date   CHOL 173 05/06/2018   TRIG 132 05/06/2018   HDL 39 (L) 05/06/2018   CHOLHDL 4.4 05/06/2018   VLDL 26 05/06/2018   LDLCALC 108 (H) 05/06/2018   LDLCALC  93 11/25/2017    Physical Findings: AIMS: Facial and Oral Movements Muscles of Facial Expression: None, normal Lips and Perioral Area: None, normal Jaw: None, normal Tongue: None, normal,Extremity Movements Upper (arms, wrists, hands, fingers): None, normal Lower (legs, knees, ankles, toes): None, normal, Trunk Movements Neck, shoulders, hips: None, normal, Overall   Severity Severity of abnormal movements (highest score from questions above): None, normal Incapacitation due to abnormal movements: None, normal Patient's awareness of abnormal movements (rate only patient's report): No Awareness, Dental Status Current problems with teeth and/or dentures?: No Does patient usually wear dentures?: No  CIWA:    COWS:     Musculoskeletal: Strength & Muscle Tone: decreased Gait & Station: normal Patient leans: N/A  Psychiatric Specialty Exam: Physical Exam  Nursing note and vitals reviewed. Constitutional: He appears well-developed and well-nourished.  HENT:  Head: Normocephalic and atraumatic.  Eyes: Pupils are equal, round, and reactive to light. Conjunctivae are normal.  Neck: Normal range of motion.  Cardiovascular: Regular rhythm and normal heart sounds.  Respiratory: Effort normal. No respiratory distress.  GI: Soft.  Musculoskeletal: Normal range of motion.  Neurological: He is alert.  Skin: Skin is warm and dry.  Psychiatric: His affect is blunt. His speech is delayed. He is slowed. Thought content is not paranoid. Cognition and memory are impaired. He expresses inappropriate judgment. He expresses no homicidal and no suicidal ideation.    Review of Systems  Constitutional: Positive for malaise/fatigue.  HENT: Negative.   Eyes: Negative.   Respiratory: Negative.   Cardiovascular: Negative.   Gastrointestinal: Negative.   Musculoskeletal: Negative.   Skin: Negative.   Neurological: Negative.   Psychiatric/Behavioral: Negative.     Blood pressure 107/65, pulse 80,  temperature 98.6 F (37 C), resp. rate 16, SpO2 100 %.There is no height or weight on file to calculate BMI.  General Appearance: Casual  Eye Contact:  Minimal  Speech:  Slow  Volume:  Decreased  Mood:  Euthymic  Affect:  Constricted  Thought Process:  Disorganized  Orientation:  Full (Time, Place, and Person)  Thought Content:  Rumination  Suicidal Thoughts:  No  Homicidal Thoughts:  No  Memory:  Immediate;   Fair Recent;   Fair Remote;   Fair  Judgement:  Impaired  Insight:  Shallow  Psychomotor Activity:  Decreased  Concentration:  Concentration: Fair  Recall:  [employer] of Knowledge:  Fair  Language:  Fair  Akathisia:  No  Handed:  Right  AIMS (if indicated):     Assets:  Desire for Improvement Housing Physical Health Resilience  ADL's:  Intact  Cognition:  Impaired,  Mild  Sleep:  Number of Hours: 6.5     Treatment Plan Summary: Daily contact with patient to assess and evaluate symptoms and progress in treatment, Medication management and Plan Patient is a little withdrawn this morning but is not agitated and is denying any hallucinations currently.  He says he is feeling better but is tired.  No change to current medication plan.  Encourage patient to get up and be more active and get out of his room.  Clozapine level pending.  Alethia Berthold, MD 05/08/2018, 1:20 PM

## 2018-05-08 NOTE — Progress Notes (Signed)
Received Anthony Luna this AM after breakfast, he was compliant with his medications. His affect is flat, endorsed feeling depressed and anxious. He stated the voices are present, but not overwhelming.

## 2018-05-09 NOTE — Progress Notes (Signed)
D: Upon arrival to the unit the patient is visible out in the milieu and social with peers playing cards. Pt. During this writer's assessment presents pleasant and cooperative, engages actively. Pt. Continues to report auditory hallucinations, but states, "they aren't as loud, because I haven't had to pace". Pt. Reports pacing helps with the voices. Denies VH/SI/HI, contracts for safety. Pt. Denies anxiety and depression. Pt. Reports the voices tell him to, "keep breathing". No behavioral concerns to report.   A: Q x 15 minute observation checks were completed for safety. Patient was provided with education, but needs reinforcement.  Patient was given/offered medications per orders. Patient  was encourage to attend groups, participate in unit activities and continue with plan of care. Pt. Chart and plans of care reviewed. Pt. Given support and encouragement.   R: Patient is complaint with medication and unit procedures with direction and encouragement.             Precautionary checks every 15 minutes for safety maintained, room free of safety hazards, patient sustains no injury or falls during this shift. Will endorse care to next shift.

## 2018-05-09 NOTE — Progress Notes (Signed)
Anthony JunesBrandon stayed in the dayroom and played cards with peers. Appeared sad and depressed and avoiding to talk to staff. Denied SI/HI, denied hallucinations. Received medications and had a snack. Went to bed and has been sleeping. No sign of distress. Staff continue to monitor for safety.

## 2018-05-09 NOTE — [facility] Group Notes (Signed)
LCSW Group Therapy Note 05/09/2018 1:15pm  Type of Therapy and Topic: Group Therapy: Feelings Around Returning Home & Establishing a Supportive Framework and Supporting Oneself When Supports Not Available  Participation Level: Active  Description of Group:  Patients first processed thoughts and feelings about upcoming discharge. These included fears of upcoming changes, lack of change, new living environments, judgements and expectations from others and overall stigma of mental health issues. The group then discussed the definition of a supportive framework, what that looks and feels like, and how do to discern it from an unhealthy non-supportive network. The group identified different types of supports as well as what to do when your family/friends are less than helpful or unavailable  Therapeutic Goals  1. Patient will identify one healthy supportive network that they can use at discharge. 2. Patient will identify one factor of a supportive framework and how to tell it from an unhealthy network. 3. Patient able to identify one coping skill to use when they do not have positive supports from others. 4. Patient will demonstrate ability to communicate their needs through discussion and/or role plays.  Summary of Patient Progress:  The patient reported he feels "50-50." Pt engaged during group session. As patients processed their anxiety about discharge and described healthy supports patient shared he is ready to be discharge. He stated "my anxiety is better." Patient listed his family, group home owner, and friends as his main support.  Patients identified at least one self-care tool they were willing to use after discharge; music and basketball.   Therapeutic Modalities Cognitive Behavioral Therapy Motivational Interviewing   Danyal Adorno  CUEBAS-COLON, LCSW 05/09/2018 11:59 AM

## 2018-05-09 NOTE — Progress Notes (Signed)
Received Anthony Luna this AM after breakfast, he was compliant with his medications. He has been visible in the milieu at intervals between napping throughout the day. He missed his noon medication. He started pacing after dinner related to the voices are present. He is unable to make out what they are saying, but they are not telling him to self harm.

## 2018-05-09 NOTE — Plan of Care (Signed)
Pt. Participation in unit activities, snacks, and groups appropriate. Pt. Complaint with medications. Pt. Denies si/hi, contracts for safety.    Problem: Activity: Goal: Interest or engagement in leisure activities will improve Outcome: Progressing   Problem: Health Behavior/Discharge Planning: Goal: Compliance with therapeutic regimen will improve Outcome: Progressing   Problem: Safety: Goal: Ability to disclose and discuss suicidal ideas will improve Outcome: Progressing   Problem: Safety: Goal: Ability to remain free from injury will improve Outcome: Progressing

## 2018-05-09 NOTE — Progress Notes (Signed)
[facility] MD Progress Note  05/09/2018 3:07 PM Anthony Luna  MRN:  [id number] Subjective: Follow-up for this young man with schizoaffective disorder.  Patient was seen once again today in bed.  The nursing staff reports that he is staying in bed much of the time.  Not getting up for meals consistently.  Patient denies having any acute symptoms although when I ask him about hallucinations he vaguely admits that they are still present.  Denies any suicidal thoughts.  When I came by to see him today he once again looked very subdued and withdrawn although he claims to feel like his thinking is much more lucid than it was previously. Principal Problem: Schizoaffective disorder, bipolar type (HCC) Diagnosis: Principal Problem:   Schizoaffective disorder, bipolar type (HCC)  Total Time spent with patient: 20 minutes  Past Psychiatric History: Past history of recurrent episodes of psychotic disorder and mood symptoms with multiple hospitalizations  Past Medical History:  Past Medical History:  Diagnosis Date  . Depression    Since moving into new apartment 1 year ago  . None to low serum cortisol response with adrenocorticotrophic hormone (ACTH) stimulation test   . Schizophrenia, paranoid type (HCC)    History reviewed. No pertinent surgical history. Family History: History reviewed. No pertinent family history. Family Psychiatric  History: See previous note Social History:  Social History   Substance and Sexual Activity  Alcohol Use Yes  . Alcohol/week: 0.0 standard drinks   Comment: "Special occasions"     Social History   Substance and Sexual Activity  Drug Use Yes  . Frequency: 4.0 times per week  . Types: Marijuana   Comment: "2 blunts"    Social History   Socioeconomic History  . Marital status: Single    Spouse name: Not on file  . Number of children: Not on file  . Years of education: Not on file  . Highest education level: Not on file  Occupational History  . Not  on file  Social Needs  . Financial resource strain: Not on file  . Food insecurity:    Worry: Not on file    Inability: Not on file  . Transportation needs:    Medical: Not on file    Non-medical: Not on file  Tobacco Use  . Smoking status: Current Every Day Smoker    Packs/day: 0.50    Types: Cigarettes    Start date: 11/29/2002  . Smokeless tobacco: Former Neurosurgeon  . Tobacco comment: does not want to quit  Substance and Sexual Activity  . Alcohol use: Yes    Alcohol/week: 0.0 standard drinks    Comment: "Special occasions"  . Drug use: Yes    Frequency: 4.0 times per week    Types: Marijuana    Comment: "2 blunts"  . Sexual activity: Not on file  Lifestyle  . Physical activity:    Days per week: Not on file    Minutes per session: Not on file  . Stress: Not on file  Relationships  . Social connections:    Talks on phone: Not on file    Gets together: Not on file    Attends religious service: Not on file    Active member of club or organization: Not on file    Attends meetings of clubs or organizations: Not on file    Relationship status: Not on file  Other Topics Concern  . Not on file  Social History Narrative  . Not on file   Additional Social  History:    Pain Medications: See PTA Prescriptions: See PTA Over the Counter: See PTA History of alcohol / drug use?: No history of alcohol / drug abuse Longest period of sobriety (when/how long): unknown Negative Consequences of Use: Financial, Personal relationships, Work / School Withdrawal Symptoms: Other (Comment)(none)                    Sleep: Fair  Appetite:  Fair  Current Medications: Current Facility-Administered Medications  Medication Dose Route Frequency Provider Last Rate Last Dose  . acetaminophen (TYLENOL) tablet 650 mg  650 mg Oral Q6H PRN [name], [name], MD      . alum & mag hydroxide-simeth (MAALOX/MYLANTA) 200-200-20 MG/5ML suspension 30 mL  30 mL Oral Q4H PRN [name], [name], MD       . buPROPion (WELLBUTRIN XL) 24 hr tablet 150 mg  150 mg Oral Daily [name], [name], MD   150 mg at 05/09/18 0850  . cloZAPine (CLOZARIL) tablet 325 mg  325 mg Oral QHS [name], MD   325 mg at 05/08/18 2112  . LORazepam (ATIVAN) tablet 1 mg  1 mg Oral Q6H PRN [name], MD   1 mg at 05/06/18 1746  . magnesium hydroxide (MILK OF MAGNESIA) suspension 30 mL  30 mL Oral Daily PRN [name], [name], MD      . nicotine (NICODERM CQ - dosed in mg/24 hours) patch 14 mg  14 mg Transdermal Daily [name], [name], MD   14 mg at 05/09/18 0850  . propranolol (INDERAL) tablet 10 mg  10 mg Oral TID [name], [name], MD   10 mg at 05/09/18 0850  . traZODone (DESYREL) tablet 50 mg  50 mg Oral QHS PRN [name], MD   50 mg at 05/08/18 2113    Lab Results: No results found for this or any previous visit (from the past 48 hour(s)).  Blood Alcohol level:  Lab Results  Component Value Date   ETH <10 05/04/2018   ETH <10 11/24/2017    Metabolic Disorder Labs: Lab Results  Component Value Date   HGBA1C 5.0 05/06/2018   MPG 96.8 05/06/2018   MPG 93.93 11/25/2017   Lab Results  Component Value Date   PROLACTIN 6.1 12/19/2014   PROLACTIN 1.3 (L) 11/30/2014   Lab Results  Component Value Date   CHOL 173 05/06/2018   TRIG 132 05/06/2018   HDL 39 (L) 05/06/2018   CHOLHDL 4.4 05/06/2018   VLDL 26 05/06/2018   LDLCALC 108 (H) 05/06/2018   LDLCALC 93 11/25/2017    Physical Findings: AIMS: Facial and Oral Movements Muscles of Facial Expression: None, normal Lips and Perioral Area: None, normal Jaw: None, normal Tongue: None, normal,Extremity Movements Upper (arms, wrists, hands, fingers): None, normal Lower (legs, knees, ankles, toes): None, normal, Trunk Movements Neck, shoulders, hips: None, normal, Overall Severity Severity of abnormal movements (highest score from questions above): None, normal Incapacitation due to abnormal movements: None, normal Patient's awareness of abnormal  movements (rate only patient's report): No Awareness, Dental Status Current problems with teeth and/or dentures?: No Does patient usually wear dentures?: No  CIWA:    COWS:     Musculoskeletal: Strength & Muscle Tone: within normal limits Gait & Station: normal Patient leans: N/A  Psychiatric Specialty Exam: Physical Exam  Nursing note and vitals reviewed. Constitutional: He appears well-developed and well-nourished.  HENT:  Head: Normocephalic and atraumatic.  Eyes: Pupils are equal, round, and reactive to light.  Conjunctivae are normal.  Neck: Normal range of motion.  Cardiovascular: Regular rhythm and normal heart sounds.  Respiratory: Effort normal. No respiratory distress.  GI: Soft.  Musculoskeletal: Normal range of motion.  Neurological: He is alert.  Skin: Skin is warm and dry.  Psychiatric: His affect is blunt. His speech is delayed. He is slowed and withdrawn. He expresses impulsivity. He expresses no homicidal and no suicidal ideation. He exhibits abnormal recent memory.    Review of Systems  Constitutional: Negative.   HENT: Negative.   Eyes: Negative.   Respiratory: Negative.   Cardiovascular: Negative.   Gastrointestinal: Negative.   Musculoskeletal: Negative.   Skin: Negative.   Neurological: Negative.   Psychiatric/Behavioral: Positive for hallucinations. Negative for depression, memory loss, substance abuse and suicidal ideas. The patient is not nervous/anxious and does not have insomnia.     Blood pressure 108/69, pulse 84, temperature 97.7 F (36.5 C), temperature source Oral, resp. rate 17, SpO2 98 %.There is no height or weight on file to calculate BMI.  General Appearance: Casual  Eye Contact:  Good  Speech:  Slow  Volume:  Decreased  Mood:  Euthymic  Affect:  Constricted  Thought Process:  Coherent  Orientation:  Full (Time, Place, and Person)  Thought Content:  Hallucinations: Auditory  Suicidal Thoughts:  No  Homicidal Thoughts:  No   Memory:  Immediate;   Fair Recent;   Fair Remote;   Fair  Judgement:  Impaired  Insight:  Shallow  Psychomotor Activity:  Decreased  Concentration:  Concentration: Fair  Recall:  [employer]Fair  Fund of Knowledge:  Fair  Language:  Fair  Akathisia:  No  Handed:  Right  AIMS (if indicated):     Assets:  Desire for Improvement Housing Physical Health Resilience Social Support  ADL's:  Intact  Cognition:  Impaired,  Mild  Sleep:  Number of Hours: 6.45     Treatment Plan Summary: Daily contact with patient to assess and evaluate symptoms and progress in treatment, Medication management and Plan Patient is still reporting some hallucinations and is withdrawn and blunted.  Not agitated not aggressive.  Patient's lithium was discontinued when he came in.  Seems to be tolerating that.  Will not make any change to his medications today.  Encourage patient to be out of bed as much as possible and to attend groups.  Mordecai RasmussenJohn [name], MD 05/09/2018, 3:07 PM

## 2018-05-10 MED ORDER — BUPROPION HCL ER (XL) 150 MG PO TB24: 150.0000 mg | ORAL_TABLET | Freq: Every day | ORAL | 1 refills | Status: DC

## 2018-05-10 MED ORDER — CLOZAPINE 50 MG PO TABS: 350.0000 mg | ORAL_TABLET | Freq: Every day | ORAL | 1 refills | Status: DC

## 2018-05-10 MED ORDER — CLOZAPINE 25 MG PO TABS
350.0000 mg | ORAL_TABLET | Freq: Every day | ORAL | Status: DC
  Administered 2018-05-10: 350 mg via ORAL
  Filled 2018-05-10: qty 3

## 2018-05-10 NOTE — Progress Notes (Signed)
[facility]  05/10/2018 5:42 PM Anthony Luna  MRN:  [id number]  Subjective:    Anthony Luna denies any symptoms of depression or psychosis. He is not suicidal or homicidal. He feels ready for discharge. There are no side effects from medications. Sleep and appetite are good.  Will talk to group home owner in am as she visited this weekend.   Principal Problem: Schizoaffective disorder, bipolar type (HCC) Diagnosis: Principal Problem:   Schizoaffective disorder, bipolar type (HCC)  Total Time spent with patient: 20 minutes  Past Psychiatric History: schizophrenia.  Past Medical History:  Past Medical History:  Diagnosis Date  . Depression    Since moving into new apartment 1 year ago  . None to low serum cortisol response with adrenocorticotrophic hormone (ACTH) stimulation test   . Schizophrenia, paranoid type (HCC)    History reviewed. No pertinent surgical history. Family History: History reviewed. No pertinent family history. Family Psychiatric  History: see H&P Social History:  Social History   Substance and Sexual Activity  Alcohol Use Yes  . Alcohol/week: 0.0 standard drinks   Comment: "Special occasions"     Social History   Substance and Sexual Activity  Drug Use Yes  . Frequency: 4.0 times per week  . Types: Marijuana   Comment: "2 blunts"    Social History   Socioeconomic History  . Marital status: Single    Spouse name: Not on file  . Number of children: Not on file  . Years of education: Not on file  . Highest education level: Not on file  Occupational History  . Not on file  Social Needs  . Financial resource strain: Not on file  . Food insecurity:    Worry: Not on file    Inability: Not on file  . Transportation needs:    Medical: Not on file    Non-medical: Not on file  Tobacco Use  . Smoking status: Current Every Day Smoker    Packs/day: 0.50    Types: Cigarettes    Start date: 11/29/2002  . Smokeless tobacco: Former  NeurosurgeonUser  . Tobacco comment: does not want to quit  Substance and Sexual Activity  . Alcohol use: Yes    Alcohol/week: 0.0 standard drinks    Comment: "Special occasions"  . Drug use: Yes    Frequency: 4.0 times per week    Types: Marijuana    Comment: "2 blunts"  . Sexual activity: Not on file  Lifestyle  . Physical activity:    Days per week: Not on file    Minutes per session: Not on file  . Stress: Not on file  Relationships  . Social connections:    Talks on phone: Not on file    Gets together: Not on file    Attends religious service: Not on file    Active member of club or organization: Not on file    Attends meetings of clubs or organizations: Not on file    Relationship status: Not on file  Other Topics Concern  . Not on file  Social History Narrative  . Not on file   Additional Social History:    Pain Medications: See PTA Prescriptions: See PTA Over the Counter: See PTA History of alcohol / drug use?: No history of alcohol / drug abuse Longest period of sobriety (when/how long): unknown Negative Consequences of Use: Financial, Personal relationships, Work / School Withdrawal Symptoms: Other (Comment)(none)  Sleep: Fair  Appetite:  Fair  Current Medications: Current Facility-Administered Medications  Medication Dose Route Frequency Provider Last Rate Last Dose  . acetaminophen (TYLENOL) tablet 650 mg  650 mg Oral Q6H PRN [name], [name], MD      . alum & mag hydroxide-simeth (MAALOX/MYLANTA) 200-200-20 MG/5ML suspension 30 mL  30 mL Oral Q4H PRN [name], [name], MD      . buPROPion (WELLBUTRIN XL) 24 hr tablet 150 mg  150 mg Oral Daily [name], [name], MD   150 mg at 05/10/18 0853  . cloZAPine (CLOZARIL) tablet 325 mg  325 mg Oral QHS [name], MD   325 mg at 05/09/18 2125  . LORazepam (ATIVAN) tablet 1 mg  1 mg Oral Q6H PRN [name], MD   1 mg at 05/06/18 1746  . magnesium hydroxide (MILK OF MAGNESIA) suspension 30 mL  30  mL Oral Daily PRN [name], [name], MD      . nicotine (NICODERM CQ - dosed in mg/24 hours) patch 14 mg  14 mg Transdermal Daily [name], [name], MD   14 mg at 05/09/18 0850  . propranolol (INDERAL) tablet 10 mg  10 mg Oral TID [name], [name], MD   10 mg at 05/10/18 1336  . traZODone (DESYREL) tablet 50 mg  50 mg Oral QHS PRN [name], MD   50 mg at 05/08/18 2113    Lab Results: No results found for this or any previous visit (from the past 48 hour(s)).  Blood Alcohol level:  Lab Results  Component Value Date   ETH <10 05/04/2018   ETH <10 11/24/2017    Metabolic Disorder Labs: Lab Results  Component Value Date   HGBA1C 5.0 05/06/2018   MPG 96.8 05/06/2018   MPG 93.93 11/25/2017   Lab Results  Component Value Date   PROLACTIN 6.1 12/19/2014   PROLACTIN 1.3 (L) 11/30/2014   Lab Results  Component Value Date   CHOL 173 05/06/2018   TRIG 132 05/06/2018   HDL 39 (L) 05/06/2018   CHOLHDL 4.4 05/06/2018   VLDL 26 05/06/2018   LDLCALC 108 (H) 05/06/2018   LDLCALC 93 11/25/2017    Physical Findings: AIMS: Facial and Oral Movements Muscles of Facial Expression: None, normal Lips and Perioral Area: None, normal Jaw: None, normal Tongue: None, normal,Extremity Movements Upper (arms, wrists, hands, fingers): None, normal Lower (legs, knees, ankles, toes): None, normal, Trunk Movements Neck, shoulders, hips: None, normal, Overall Severity Severity of abnormal movements (highest score from questions above): None, normal Incapacitation due to abnormal movements: None, normal Patient's awareness of abnormal movements (rate only patient's report): No Awareness, Dental Status Current problems with teeth and/or dentures?: No Does patient usually wear dentures?: No  CIWA:    COWS:     Musculoskeletal: Strength & Muscle Tone: within normal limits Gait & Station: normal Patient leans: N/A  Psychiatric Specialty Exam: Physical Exam  Nursing note and vitals  reviewed. Psychiatric: His speech is normal and behavior is normal. Judgment and thought content normal. His affect is blunt. Cognition and memory are normal.    Review of Systems  Neurological: Negative.   Psychiatric/Behavioral: Negative.   All other systems reviewed and are negative.   Blood pressure 119/88, pulse 91, temperature 97.7 F (36.5 C), temperature source Oral, resp. rate 18, SpO2 100 %.There is no height or weight on file to calculate BMI.  General Appearance: Casual  Eye Contact:  Good  Speech:  Clear and Coherent  Volume:  Normal  Mood:  Euthymic  Affect:  Appropriate  Thought Process:  Goal Directed and Descriptions of Associations: Intact  Orientation:  Full (Time, Place, and Person)  Thought Content:  WDL  Suicidal Thoughts:  No  Homicidal Thoughts:  No  Memory:  Immediate;   Fair Recent;   Fair Remote;   Fair  Judgement:  Impaired  Insight:  Shallow  Psychomotor Activity:  Normal  Concentration:  Concentration: Fair and Attention Span: Fair  Recall:  [employer] of Knowledge:  Fair  Language:  Fair  Akathisia:  No  Handed:  Right  AIMS (if indicated):     Assets:  Communication Skills Desire for Improvement Financial Resources/Insurance Housing Physical Health Resilience Social Support  ADL's:  Intact  Cognition:  WNL  Sleep:  Number of Hours: 7.75     Treatment Plan Summary: Daily contact with patient to assess and evaluate symptoms and progress in treatment and Medication management   32 yo male admitted due to worsening hallucinations and paranoia. He is known to this provider from previous admissions. HE has been relatively stable on Clozapine since it was started in April. HE is calm and very polite. HE reports VH and paranoia and actually has really good insight that he does not feel that these things are actually happening but are distressing nonetheless. I did request [facility] from [facility] and he is still on Clozapine and dose was  increased to 300 mg qhs last month. He is also on [place].   Plan:  Schizoaffective disorder -Restart Clozapine 300 mg qhs. Will check level. Consider increasing dose. Monitor ANC -Eyvonne Mechanic. Next dose due 05/19/18 -Restart Lithium 450 mg BID. Level 0.4 -Check EKG -will add prn Haldol for AH and agitation  Tremor/akathisia -will stop symmetrel as this can sometimes worsen psychosis and he is also on propranolol which can help with tremor (tremor is not noticeable on exam) -Continue propranolol but increase dose back to 10 mg TID for anxiety and akathisia  Dispo -Return back to group home when stable. Pt DOES NOT have guardian. He has CST with [employer].    Kristine Linea, MD 05/10/2018, 5:42 PM

## 2018-05-10 NOTE — [facility] Suicide Risk Assessment (Signed)
[facility]   Principal Problem: Schizoaffective disorder, bipolar type [facility](HCC) Discharge Diagnoses: Principal Problem:   Schizoaffective disorder, bipolar type (HCC) Active Problems:   Tobacco use disorder   Total Time spent with patient: 20 minutes plus 15 min on care coordination and documentation  Musculoskeletal: Strength & Muscle Tone: within normal limits Gait & Station: normal Patient leans: N/A  Psychiatric Specialty Exam: Review of Systems  Neurological: Negative.   Psychiatric/Behavioral: Negative.   All other systems reviewed and are negative.   Blood pressure 109/72, pulse (!) 109, temperature 97.7 F (36.5 C), temperature source Oral, resp. rate 18, SpO2 98 %.There is no height or weight on file to calculate BMI.  General Appearance: Casual  Eye Contact::  Good  Speech:  Clear and Coherent409  Volume:  Normal  Mood:  Euthymic  Affect:  Appropriate  Thought Process:  Goal Directed and Descriptions of Associations: Intact  Orientation:  Full (Time, Place, and Person)  Thought Content:  WDL  Suicidal Thoughts:  No  Homicidal Thoughts:  No  Memory:  Immediate;   Fair Recent;   Fair Remote;   Fair  Judgement:  Fair  Insight:  Present  Psychomotor Activity:  Normal  Concentration:  Fair  Recall:  FiservFair  Fund of Knowledge:Fair  Language: Fair  Akathisia:  No  Handed:  Right  AIMS (if indicated):     Assets:  Communication Skills Desire for Improvement Financial Resources/Insurance Housing Physical Health Resilience Social Support  Sleep:  Number of Hours: 6.5  Cognition: WNL  ADL's:  Intact   Mental Status Per Nursing Assessment::   On Admission:  NA  Demographic Factors:  Male and Adolescent or young adult  Loss Factors: NA  Historical Factors: Impulsivity  Risk Reduction Factors:   Sense of responsibility to family, Living with another person, especially a relative, Positive social support and Positive  therapeutic relationship  Continued Clinical Symptoms:  Schizophrenia:   Depressive state Less than 31 years old Paranoid or undifferentiated type  Cognitive Features That Contribute To Risk:  None    Suicide Risk:  Minimal: No identifiable suicidal ideation.  Patients presenting with no risk factors but with morbid ruminations; may be classified as minimal risk based on the severity of the depressive symptoms  Follow-up Information    [facility] Follow up.   Contact information: [address] [place] [address] [phone]           Plan Of Care/Follow-up recommendations:  Activity:  as tolerated Diet:  low sodium heart healthy Other:  keep follow up appointments  Kristine LineaJolanta Eliza Green, MD 05/11/2018, 7:38 AM

## 2018-05-10 NOTE — Progress Notes (Signed)
Recreation Therapy Notes  Date: 05/10/18  Time: 9:30 am  Location: Craft Room  Behavioral response: Appropriate   Intervention Topic: Leisure  Discussion/Intervention:  Group content today was focused on leisure. The group defined what leisure is and some positive leisure activities they participate in. Individuals identified the difference between good and bad leisure. Participants expressed how they feel after participating in the leisure of their choice. The group discussed how they go about picking a leisure activity and if others are involved in their leisure activities. The patient stated how many leisure activities they too choose from and reasons why it is important to have leisure time. Individuals participated in the intervention "Leisure Jeopardy" where they had a chance to identify new leisure activities as well as benefits of leisure. Clinical Observations/Feedback:  Patient came to group and identified his leisure activities as resting and exercise. Individual was social with peers and staff while participating in the intervention.  Steve Youngberg LRT/CTRS        Shatira Dobosz 05/10/2018 11:51 AM

## 2018-05-10 NOTE — BHH Group Notes (Signed)
LCSW Group Therapy Note   05/10/2018 1:15pm   Type of Therapy and Topic:  Group Therapy:  Overcoming Obstacles   Participation Level:  Active   Description of Group:    In this group patients will be encouraged to explore what they see as obstacles to their own wellness and recovery. They will be guided to discuss their thoughts, feelings, and behaviors related to these obstacles. The group will process together ways to cope with barriers, with attention given to specific choices patients can make. Each patient will be challenged to identify changes they are motivated to make in order to overcome their obstacles. This group will be process-oriented, with patients participating in exploration of their own experiences as well as giving and receiving support and challenge from other group members.   Therapeutic Goals: 1. Patient will identify personal and current obstacles as they relate to admission. 2. Patient will identify barriers that currently interfere with their wellness or overcoming obstacles.  3. Patient will identify feelings, thought process and behaviors related to these barriers. 4. Patient will identify two changes they are willing to make to overcome these obstacles:      Summary of Patient Progress   Stayed the entire time, engaged throughout.  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  Also cited good supports at home.   Therapeutic Modalities:   Cognitive Behavioral Therapy Solution Focused Therapy Motivational Interviewing Relapse Prevention Therapy  Ida RogueRodney B Oluwaseun Bruyere, LCSW 05/10/2018 3:17 PM

## 2018-05-10 NOTE — Progress Notes (Signed)
Patient was visible in the milieu until bedtime. Mostly in the dayroom. Alert and oriented and denying SI. When assessed for hallucinations, patient chose not to respond. Was observed interacting with peers and maintained appropriate behavior. Patient received bedtime medication and went to his room. Currently sleeping. Safety precautions maintained.

## 2018-05-10 NOTE — Progress Notes (Signed)
Received Anthony Luna this AM fter breakfast, he was compliant with his medications. His affect is flat and looks very sad. He endorsed feeling depressed, anxious with auditory hallucinations. He remained OOB in the milieu and playing cards with one of his peers. In the PM his affect is brighter and verbalized a desire to go home before the holidays.

## 2018-05-10 NOTE — Discharge Summary (Signed)
Physician Discharge Summary Note  Patient:  Anthony Luna is an 32 y.o., male MRN:  [id number] DOB:  [birth year] Patient phone:  [phone] (home)  Patient address:   [address] [place] [address],  Total Time spent with patient: 20 minutes plus 15 min on care coordiation and documentation  Date of Admission:  05/05/2018 Date of Discharge: 05/11/2018  Reason for Admission:  Psychotic break.  History of Present Illness: 32 yo male admitted due to worsening VH and AH. Pt is known to this provider from previous admissions. His last admission here was in June in which Clozapine was increased at that visit. He has been doing relatively well since that admission. HE moved in a group home which he was desiring to do. He states that he has been in the group home since July and does like it there. HE likes the structure. He states that he has been having more Paranoia and " thinking that people are poisoning my food and putting things in it." He has insight into the fact that he does not think this is true. HE states that he was having VH of "people stabbing me." He states tat he got into an argument at the group home and this is when things started worsening. He states that he sometimes hears negative voices but overall they are positive and say th ings such as "Just breath and be yourself." HE states that he tends to pace to distract himself but is nervous that "people thing this is weird."  Pt is a bit more hyperreligious this admission than I have seen him. HE is very calm and polite which is usually himself. His affect is happy and smiling. HE is going to groups and he wanted to show me his homework that he did after group identifying emotions. He is a little disorganized at times but able to redirect. HE states that he does feel anxious at times and states that his doctor took him off his anxiety medications that we started. He is not sure which one. Looking at the records the only change has  been a decrease in dose of Propranolol to once a day. He does have [facility] with [facility] and has a peer support through them named, Anthony Luna and he likes her a lot. He has stopped smoking mariajuana and UDS is negative. We discussed how great of an accomplishment is and he is very proud of himself for being able to do this. Pt is his own guardian.             I did speak with group home owner Anthony Luna. She states that he has been a bit more irritable and agitated lately. He mentioned that he was having hallucinations that people were stabbing him and putting poison in his food.   Associated Signs/Symptoms: Depression Symptoms:  depressed mood, difficulty concentrating, (Hypo) Manic Symptoms:  Distractibility, Anxiety Symptoms:  Excessive Worry, Psychotic Symptoms:  Hallucinations: Auditory Visual PTSD Symptoms: Negative  Past Psychiatric History: History of schizoaffective disorder. He has had multiple hospitalizations with last one in June. He reports one suicide attempt by "banging my head on the floor a long time ago." He has had multiple medication trials. He was started on Clozapine during an admission in April. He follows with Anthony Luna through [facility]. He does not have a guardian. He has a peer support, Anthony Luna.   Family Psychiatric  History: Unknown  Social History: Pt has been in a group home since July. Previously was living  independently. He has Anthony Luna team who checks on him regularly.   Principal Problem: Schizoaffective disorder, bipolar type [facility](HCC) Discharge Diagnoses: Principal Problem:   Schizoaffective disorder, bipolar type (HCC) Active Problems:   Tobacco use disorder  Past Medical History:  Past Medical History:  Diagnosis Date  . Depression    Since moving into new apartment 1 year ago  . None to low serum cortisol response with adrenocorticotrophic hormone (ACTH) stimulation test   . Schizophrenia, paranoid type (HCC)    History reviewed. No pertinent surgical  history. Family History: History reviewed. No pertinent family history.  Social History:  Social History   Substance and Sexual Activity  Alcohol Use Yes  . Alcohol/week: 0.0 standard drinks   Comment: "Special occasions"     Social History   Substance and Sexual Activity  Drug Use Yes  . Frequency: 4.0 times per week  . Types: Marijuana   Comment: "2 blunts"    Social History   Socioeconomic History  . Marital status: Single    Spouse name: Not on file  . Number of children: Not on file  . Years of education: Not on file  . Highest education level: Not on file  Occupational History  . Not on file  Social Needs  . Financial resource strain: Not on file  . Food insecurity:    Worry: Not on file    Inability: Not on file  . Transportation needs:    Medical: Not on file    Non-medical: Not on file  Tobacco Use  . Smoking status: Current Every Day Smoker    Packs/day: 0.50    Types: Cigarettes    Start date: 11/29/2002  . Smokeless tobacco: Former NeurosurgeonUser  . Tobacco comment: does not want to quit  Substance and Sexual Activity  . Alcohol use: Yes    Alcohol/week: 0.0 standard drinks    Comment: "Special occasions"  . Drug use: Yes    Frequency: 4.0 times per week    Types: Marijuana    Comment: "2 blunts"  . Sexual activity: Not on file  Lifestyle  . Physical activity:    Days per week: Not on file    Minutes per session: Not on file  . Stress: Not on file  Relationships  . Social connections:    Talks on phone: Not on file    Gets together: Not on file    Attends religious service: Not on file    Active member of club or organization: Not on file    Attends meetings of clubs or organizations: Not on file    Relationship status: Not on file  Other Topics Concern  . Not on file  Social History Narrative  . Not on file    Luna Course:    Mr. Anthony Luna is a [age] yo male with a history od psychosis admitted for worsening hallucinations and paranoia. He is  known to this provider from previous admissions. He has been relatively stable on Clozapine since it was started in April. He is also on [place]ib[place]Invega Trinza. We increased Clozapine to 350 mg and added Wellbutrin. At the time of discharge, the patient is no longer psychotic.   #Schizoaffective disorder, improved -restarted Clozapine 300 mg qhs and increased dose to 350 mg, Clo level pending -continue Eyvonne MechanicInvega Trinza, next dose due 05/19/18 -we discontinued Lithium -started Wellbutrin 150 mg daily -EKG reviewed, NSR with QTc 415  #Tremor/akathisia -Symmetrel was stopped as this can sometimes worsen psychosis -continue propranolol 10 mg TID  for anxiety and akathisia  #Disposition -discharged back to group home -follow up with [place] [facility]  Physical Findings: AIMS: Facial and Oral Movements Muscles of Facial Expression: None, normal Lips and Perioral Area: None, normal Jaw: None, normal Tongue: None, normal,Extremity Movements Upper (arms, wrists, hands, fingers): None, normal Lower (legs, knees, ankles, toes): None, normal, Trunk Movements Neck, shoulders, hips: None, normal, Overall Severity Severity of abnormal movements (highest score from questions above): None, normal Incapacitation due to abnormal movements: None, normal Patient's awareness of abnormal movements (rate only patient's report): No Awareness, Dental Status Current problems with teeth and/or dentures?: No Does patient usually wear dentures?: No  CIWA:    COWS:     Musculoskeletal: Strength & Muscle Tone: within normal limits Gait & Station: normal Patient leans: N/A  Psychiatric Specialty Exam: Physical Exam  Nursing note and vitals reviewed. Psychiatric: He has a normal mood and affect. His speech is normal and behavior is normal. Thought content normal. Cognition and memory are normal. He expresses impulsivity.    Review of Systems  Neurological: Negative.   Psychiatric/Behavioral: Negative.   All other  systems reviewed and are negative.   Blood pressure 109/72, pulse (!) 109, temperature 97.7 F (36.5 C), temperature source Oral, resp. rate 18, SpO2 98 %.There is no height or weight on file to calculate BMI.  General Appearance: Casual  Eye Contact:  Good  Speech:  Clear and Coherent  Volume:  Normal  Mood:  Euthymic  Affect:  Appropriate  Thought Process:  Goal Directed and Descriptions of Associations: Intact  Orientation:  Full (Time, Place, and Person)  Thought Content:  WDL  Suicidal Thoughts:  No  Homicidal Thoughts:  No  Memory:  Immediate;   Fair Recent;   Fair Remote;   Fair  Judgement:  Fair  Insight:  Present  Psychomotor Activity:  Normal  Concentration:  Concentration: Fair and Attention Span: Fair  Recall:  [employer] of Knowledge:  Fair  Language:  Fair  Akathisia:  No  Handed:  Right  AIMS (if indicated):     Assets:  Communication Skills Desire for Improvement Financial Resources/Insurance Housing Physical Health Resilience Social Support  ADL's:  Intact  Cognition:  WNL  Sleep:  Number of Hours: 6.5     Have you used any form of tobacco in the last 30 days? (Cigarettes, Smokeless Tobacco, Cigars, and/or Pipes): Yes  Has this patient used any form of tobacco in the last 30 days? (Cigarettes, Smokeless Tobacco, Cigars, and/or Pipes) Yes, Yes, A prescription for an FDA-approved tobacco cessation medication was offered at discharge and the patient refused  Blood Alcohol level:  Lab Results  Component Value Date   [place] [facility] <10 05/04/2018   ETH <10 11/24/2017    Metabolic Disorder Labs:  Lab Results  Component Value Date   HGBA1C 5.0 05/06/2018   MPG 96.8 05/06/2018   MPG 93.93 11/25/2017   Lab Results  Component Value Date   PROLACTIN 6.1 12/19/2014   PROLACTIN 1.3 (L) 11/30/2014   Lab Results  Component Value Date   CHOL 173 05/06/2018   TRIG 132 05/06/2018   HDL 39 (L) 05/06/2018   CHOLHDL 4.4 05/06/2018   VLDL 26 05/06/2018   LDLCALC  108 (H) 05/06/2018   LDLCALC 93 11/25/2017    See Psychiatric Specialty Exam and Suicide Risk Assessment completed by Attending Physician prior to discharge.  Discharge destination:  Home  Is patient on multiple antipsychotic therapies at discharge:  Yes,   Do you  recommend tapering to monotherapy for antipsychotics?  No   Has Patient had three or more failed trials of antipsychotic monotherapy by history:  No  Recommended Plan for Multiple Antipsychotic Therapies: Second antipsychotic is Clozapine.  Reason for adding Clozapine inadequate respose to a single agent  Discharge Instructions    Diet - low sodium heart healthy   Complete by:  As directed    Increase activity slowly   Complete by:  As directed      Allergies as of 05/11/2018   No Known Allergies     Medication List    STOP taking these medications   amantadine 100 MG capsule Commonly known as:  SYMMETREL   lithium carbonate 450 MG CR tablet Commonly known as:  ESKALITH     TAKE these medications     Indication  buPROPion 150 MG 24 hr tablet Commonly known as:  WELLBUTRIN XL Take 1 tablet (150 mg total) by mouth daily.  Indication:  Major Depressive Disorder   clozapine 50 MG tablet Commonly known as:  CLOZARIL Take 7 tablets (350 mg total) by mouth at bedtime. What changed:  how much to take  Indication:  Schizophrenia that does Not Respond to Usual Drug Therapy   INVEGA TRINZA 819 MG/2.625ML Susp Generic drug:  Paliperidone Palmitate Inject 819 mg into the muscle.  Indication:  Schizophrenia   propranolol 10 MG tablet Commonly known as:  INDERAL Take 1 tablet (10 mg total) by mouth 3 (three) times daily.  Indication:  Neuroleptic-Induced Akathisia      Follow-up Information    [facility] Follow up.   Contact information: [address] [place] [address] [phone]           Follow-up recommendations:  Activity:  as tolerated Diet:  low sodium heart healthy Other:   keep follow up appointments  Comments:    Signed: Kristine Linea, MD 05/11/2018, 7:39 AM

## 2018-05-10 NOTE — Plan of Care (Signed)
Stayed in the milieu, sad and depressed but more active.

## 2018-05-11 LAB — CLOZAPINE (CLOZARIL)
Clozapine Lvl: 53 ng/mL — ABNORMAL LOW (ref 350–650)
NorClozapine: 64 ng/mL
Total(Cloz+Norcloz): 117 ng/mL

## 2018-05-11 NOTE — [facility] Suicide Risk Assessment (Signed)
[facility] INPATIENT:  Family/Significant Other Suicide Prevention Education  Suicide Prevention Education:  Education Completed; Adriana ReamsGwen [name], owner of [facility] [phone]  has been identified by the patient as the family member/significant other with whom the patient will be residing, and identified as the person(s) who will aid the patient in the event of a mental health crisis (suicidal ideations/suicide attempt).  With written consent from the patient, the family member/significant other has been provided the following suicide prevention education, prior to the and/or following the discharge of the patient.  The suicide prevention education provided includes the following:  Suicide risk factors  Suicide prevention and interventions  National Suicide Hotline telephone number  [facility] assessment telephone number  [facility] Emergency Assistance 911  [facility] and/or Residential Mobile Crisis Unit telephone number  Request made of family/significant other to:  Remove weapons (e.g., guns, rifles, knives), all items previously/currently identified as safety concern.    Remove drugs/medications (over-the-counter, prescriptions, illicit drugs), all items previously/currently identified as a safety concern.  The family member/significant other verbalizes understanding of the suicide prevention education information provided.  The family member/significant other agrees to remove the items of safety concern listed above. Ms. Dan HumphreysMebane is the owner of the [facility] group home, where the pt is residing. She denies pt having access to any guns or weapons in the home. She has no reservation about pt returning to the group home and will pick him up at discharge. Lechelle Wrigley T Tarshia Kot 05/11/2018, 9:22 AM

## 2018-05-11 NOTE — Progress Notes (Signed)
Recreation Therapy Notes  Date: 05/10/2018  Time: 9:30 am   Location: Craft room   Behavioral response: N/A   Intervention Topic: Relaxation  Discussion/Intervention: Patient did not attend group.   Clinical Observations/Feedback:  Patient did not attend group.   Laquitta Dominski LRT/CTRS        Tarrie Mcmichen 05/11/2018 11:00 AM

## 2018-05-11 NOTE — Progress Notes (Signed)
Recreation Therapy Notes  INPATIENT RECREATION TR PLAN  Patient Details Name: Anthony Luna MRN: [id number] DOB: 07/04/1985 Today's Date: 05/11/2018  Rec Therapy Plan Is patient appropriate for Therapeutic Recreation?: Yes Treatment times per week: at least 3 Estimated Length of Stay: 5-7 days TR Treatment/Interventions: Group participation (Comment)  Discharge Criteria Pt will be discharged from therapy if:: Discharged Treatment plan/goals/alternatives discussed and agreed upon by:: Patient/family  Discharge Summary Short term goals set: Patient will engage in interactions with peers and staff in pro-social manner at least 2x within 5 recreation therapy group sessions Short term goals met: Adequate for discharge Progress toward goals comments: Groups attended Which groups?: Leisure education, Other (Comment)(Emotions) Reason goals not met: N/A Therapeutic equipment acquired: N/A Reason patient discharged from therapy: Discharge from hospital Pt/family agrees with progress & goals achieved: Yes Date patient discharged from therapy: 05/11/18   Caitlen Worth 05/11/2018, 1:27 PM

## 2018-05-11 NOTE — Progress Notes (Signed)
  [facility] Adult Case Management Discharge Plan :  Will you be returning to the same living situation after discharge:  Yes,  [facility] At discharge, do you have transportation home?: Yes,  [name] will pick up at discharge Do you have the ability to pay for your medications: Yes,  Insurance  Release of information consent forms completed and in the chart;  Patient's signature needed at discharge.  Patient to Follow up at: Follow-up Information    [name] Go on 05/20/2018.   Why:  Please [facility] Dolores [facility]Kenneth Head[facility] on Thursday, May 20, 2018 at 1:15pm. Please follow up with Consuella LoseElaine Washington(therapist) on Friday, December 6th at 3pm. Thank you. Contact information: [address] [place] [address] [phone]           Next level of care provider has access to [facility] Link:no  Safety Planning and Suicide Prevention discussed: Yes,  [name]  Have you used any form of tobacco in the last 30 days? (Cigarettes, Smokeless Tobacco, Cigars, and/or Pipes): Yes  Has patient been referred to the Quitline?: Patient refused referral  Patient has been referred for addiction treatment: Pt. refused referral  Lonney Revak T Aerion Bagdasarian, LCSW 05/11/2018, 9:30 AM

## 2018-05-11 NOTE — Progress Notes (Signed)
Patient ID: Anthony Luna, male   DOB: 04/29/1986, 32 y.o.   MRN: [id number]   Discharge Note:  Patient denies SI/HI/AVH at this time. Discharge instructions, AVS, transition record, and prescriptions gone over with patient. Patient agrees to comply with medication management, follow-up visit, and outpatient therapy. Patient belongings returned to patient. Patient questions and concerns addressed and answered. Patient ambulatory off unit. Patient discharged to group home with group home staff member.

## 2018-05-11 NOTE — Progress Notes (Signed)
D- Patient alert and oriented. Patient presents in a pleasant mood on assessment stating that he slept good last night, but had some complaints of feeling tired from being on medication. Patient denies SI, HI, AVH, and pain at this time. Patient also denies to this writer any signs/symptoms of depression or anxiety, however, he did report on his self-inventory an anxiety level of "2/10", but did not verbalize this to this Clinical research associatewriter. Patient's goal for today is to "stay positive".  A- Scheduled medications administered to patient, per MD orders. Support and encouragement provided.  Routine safety checks conducted every 15 minutes.  Patient informed to notify staff with problems or concerns.  R- No adverse drug reactions noted. Patient contracts for safety at this time. Patient compliant with medications and treatment plan. Patient receptive, calm, and cooperative. Patient interacts well with others on the unit.  Patient remains safe at this time.

## 2018-05-11 NOTE — [facility] Group Notes (Signed)
Feelings Around Diagnosis 05/11/2018 1PM  Type of Therapy/Topic:  Group Therapy:  Feelings about Diagnosis  Participation Level:  Active   Description of Group:   This group will allow patients to explore their thoughts and feelings about diagnoses they have received. Patients will be guided to explore their level of understanding and acceptance of these diagnoses. Facilitator will encourage patients to process their thoughts and feelings about the reactions of others to their diagnosis and will guide patients in identifying ways to discuss their diagnosis with significant others in their lives. This group will be process-oriented, with patients participating in exploration of their own experiences, giving and receiving support, and processing challenge from other group members.   Therapeutic Goals: 1. Patient will demonstrate understanding of diagnosis as evidenced by identifying two or more symptoms of the disorder 2. Patient will be able to express two feelings regarding the diagnosis 3. Patient will demonstrate their ability to communicate their needs through discussion and/or role play  Summary of Patient Progress: Actively and appropriately engaged in the group. Patient was able to provide support and validation to other group members.Patient practiced active listening when interacting with the facilitator and other group members. Patient demonstrated great insight and shared with the group feeling about dx and coping methods he uses to manage sxs of dx. Patient is still in the process of obtaining treatment goals.        Therapeutic Modalities:   Cognitive Behavioral Therapy Brief Therapy Feelings Identification    Suzan SlickDARREN T Landree Fernholz, LCSW 05/11/2018 3:35 PM

## 2018-05-11 NOTE — [place] FL2 (Signed)
  [place] MEDICAID FL2 LEVEL OF CARE SCREENING TOOL     IDENTIFICATION  Patient Name: Doree BarthelBrandon Wayne Hartje Birthdate: 12/04/1985 Sex: male Admission Date (Current Location): 05/05/2018  [place]   Facility and Address:  [facility], [address], [place], [place] [address]      Provider Number: [id number]  Attending Physician Name and Address:  Haskell RilingMcNew, [name], MD  Relative Name and Phone Number:       Current Level of Care: Hospital Recommended Level of Care: Family Care Home, Assisted Living Facility Prior Approval Number:    Date Approved/Denied:   PASRR Number:    Discharge Plan: Domiciliary (Rest home)  [facility] group [facility]    Current Diagnoses: Patient Active Problem List   Diagnosis Date Noted  . Cannabis use disorder, severe, dependence (HCC) 11/30/2014  . Schizoaffective disorder, bipolar type (HCC) 11/30/2014  . Tobacco use disorder 11/30/2014    Orientation RESPIRATION BLADDER Height & Weight     Self, Time, Situation, Place  Normal Continent Weight:   Height:     BEHAVIORAL SYMPTOMS/MOOD NEUROLOGICAL BOWEL NUTRITION STATUS  (None) (None) Continent (Regular diet)  AMBULATORY STATUS COMMUNICATION OF NEEDS Skin   Independent Verbally Normal                       Personal Care Assistance Level of Assistance  Bathing, Feeding, Dressing Bathing Assistance: Independent Feeding assistance: Independent Dressing Assistance: Independent     Functional Limitations Info  Sight, Hearing, Speech Sight Info: Adequate Hearing Info: Adequate Speech Info: Adequate    SPECIAL CARE FACTORS FREQUENCY  (None)                    Contractures Contractures Info: Not present    Additional Factors Info  Allergies, Psychotropic   Allergies Info: NKA Psychotropic Info: Prescribed medication         Current Medications (05/11/2018):  This is the current hospital active medication  list    Discharge Medications: Please see discharge summary for a list of discharge medications. buPROPion 150 MG 24 hr tablet  Commonly known as: WELLBUTRIN XL  Take 1 tablet (150 mg total) by mouth daily.  For: Major Depressive Disorder  Next does due:           clozapine 50 MG tablet  Commonly known as: CLOZARIL  Take 7 tablets (350 mg total) by mouth at bedtime.  What changed: how much to take  For: Schizophrenia that does Not Respond to Usual Drug Therapy  Next does due:          INVEGA TRINZA 819 MG/2.625ML Susp  Inject 819 mg into the muscle.  For: Schizophrenia  Generic drug: Paliperidone Palmitate  Next does due:          propranolol 10 MG tablet  Commonly known as: INDERAL  Take 1 tablet (10 mg total) by mouth 3 (three) times daily.  For: Neuroleptic-Induced Akathisia  Next does due:           Relevant Imaging Results:  Relevant Lab Results:   Additional Information    Maykayla Highley Philip Aspen Izaya [place], LCSW

## 2018-05-13 LAB — CLOZAPINE (CLOZARIL)
Clozapine Lvl: 262 ng/mL — ABNORMAL LOW (ref 350–650)
NORCLOZAPINE: 160 ng/mL
Total(Cloz+Norcloz): 422 ng/mL

## 2018-06-28 ENCOUNTER — Emergency Department
Admission: EM | Admit: 2018-06-28 | Discharge: 2018-06-29 | Disposition: A | Discharge status: Home / Self Care | Payer: Medicaid Other | Attending: Student in an Organized Health Care Education/Training Program | Admitting: Student in an Organized Health Care Education/Training Program

## 2018-06-28 ENCOUNTER — Other Ambulatory Visit: Payer: Self-pay

## 2018-06-28 ENCOUNTER — Encounter: Payer: Self-pay | Admitting: Emergency Medicine

## 2018-06-28 DIAGNOSIS — Z79899 Other long term (current) drug therapy: Secondary | ICD-10-CM | POA: Insufficient documentation

## 2018-06-28 DIAGNOSIS — F419 Anxiety disorder, unspecified: Secondary | ICD-10-CM | POA: Insufficient documentation

## 2018-06-28 DIAGNOSIS — F250 Schizoaffective disorder, bipolar type: Secondary | ICD-10-CM

## 2018-06-28 DIAGNOSIS — F17210 Nicotine dependence, cigarettes, uncomplicated: Secondary | ICD-10-CM | POA: Diagnosis not present

## 2018-06-28 LAB — CBC WITH DIFFERENTIAL/PLATELET
ABS IMMATURE GRANULOCYTES: 0.04 10*3/uL (ref 0.00–0.07)
Basophils Absolute: 0 10*3/uL (ref 0.0–0.1)
Basophils Relative: 0 %
Eosinophils Absolute: 0.3 10*3/uL (ref 0.0–0.5)
Eosinophils Relative: 3 %
HCT: 44.8 % (ref 39.0–52.0)
Hemoglobin: 15.4 g/dL (ref 13.0–17.0)
Immature Granulocytes: 0 %
LYMPHS ABS: 2.1 10*3/uL (ref 0.7–4.0)
Lymphocytes Relative: 21 %
MCH: 30.6 pg (ref 26.0–34.0)
MCHC: 34.4 g/dL (ref 30.0–36.0)
MCV: 89.1 fL (ref 80.0–100.0)
Monocytes Absolute: 1.1 10*3/uL — ABNORMAL HIGH (ref 0.1–1.0)
Monocytes Relative: 11 %
NRBC: 0 % (ref 0.0–0.2)
Neutro Abs: 6.4 10*3/uL (ref 1.7–7.7)
Neutrophils Relative %: 65 %
Platelets: 183 10*3/uL (ref 150–400)
RBC: 5.03 MIL/uL (ref 4.22–5.81)
RDW: 12.5 % (ref 11.5–15.5)
WBC: 9.9 10*3/uL (ref 4.0–10.5)

## 2018-06-28 LAB — URINE DRUG SCREEN, QUALITATIVE (ARMC ONLY)
Amphetamines, Ur Screen: NOT DETECTED
Barbiturates, Ur Screen: NOT DETECTED
Benzodiazepine, Ur Scrn: NOT DETECTED
Cannabinoid 50 Ng, Ur ~~LOC~~: NOT DETECTED
Cocaine Metabolite,Ur ~~LOC~~: NOT DETECTED
MDMA (Ecstasy)Ur Screen: NOT DETECTED
Methadone Scn, Ur: NOT DETECTED
Opiate, Ur Screen: NOT DETECTED
Phencyclidine (PCP) Ur S: NOT DETECTED
Tricyclic, Ur Screen: NOT DETECTED

## 2018-06-28 LAB — COMPREHENSIVE METABOLIC PANEL
ALT: 30 U/L (ref 0–44)
AST: 25 U/L (ref 15–41)
Albumin: 4.5 g/dL (ref 3.5–5.0)
Alkaline Phosphatase: 72 U/L (ref 38–126)
Anion gap: 7 (ref 5–15)
BUN: 14 mg/dL (ref 6–20)
CO2: 24 mmol/L (ref 22–32)
Calcium: 9.5 mg/dL (ref 8.9–10.3)
Chloride: 107 mmol/L (ref 98–111)
Creatinine, Ser: 1.23 mg/dL (ref 0.61–1.24)
GFR calc non Af Amer: >60 mL/min (ref >60)
Glucose, Bld: 122 mg/dL — ABNORMAL HIGH (ref 70–99)
Potassium: 3.8 mmol/L (ref 3.5–5.1)
Sodium: 138 mmol/L (ref 135–145)
Total Bilirubin: 0.5 mg/dL (ref 0.3–1.2)
Total Protein: 7.4 g/dL (ref 6.5–8.1)

## 2018-06-28 LAB — ETHANOL: Alcohol, Ethyl (B): <10 mg/dL (ref <10)

## 2018-06-28 MED ORDER — CLOZAPINE 25 MG PO TABS
350.0000 mg | ORAL_TABLET | Freq: Every day | ORAL | Status: DC
  Filled 2018-06-28: qty 2

## 2018-06-28 MED ORDER — BUPROPION HCL ER (XL) 150 MG PO TB24
150.0000 mg | ORAL_TABLET | Freq: Every day | ORAL | Status: DC
  Administered 2018-06-29: 150 mg via ORAL
  Filled 2018-06-28: qty 1

## 2018-06-28 NOTE — ED Triage Notes (Signed)
Pt arrived to the ED accompanied by [facility] owner of  [facility] [phone]) for complaints of anxiety, auditory hallucinations (at the group home) and SI. Pt reports that at the house he was hearing voices that were telling him to drink poisonous chemicals. Pt is AOx4 in no apparent distress with flat affect stating that he feels paranoid.

## 2018-06-28 NOTE — ED Notes (Signed)
Snack and beverage given. 

## 2018-06-28 NOTE — ED Notes (Signed)
Hourly rounding reveals patient sleeping in room. No complaints, stable, in no acute distress. Q15 minute rounds and monitoring via Security Cameras to continue. 

## 2018-06-28 NOTE — ED Notes (Signed)
Pt. Transferred to [facility] from ED to room after screening for contraband. Report to include Situation, Background, Assessment and Recommendations from [facility] RN. Pt. Oriented to unit including Q15 minute rounds as well as the security cameras for their protection. Patient is alert and oriented, warm and dry in no acute distress. Patient denies SI, HI, and AVH. Pt. Encouraged to let me know if needs arise.

## 2018-06-28 NOTE — ED Notes (Signed)
Patient alert and oriented, warm and dry, in no acute distress. Patient said he had a panic attack earlier today. He said he took his medication at the group home before coming in to the hospital. He denied SI, HI, AVH and pain. Patient made aware of Q15 minute rounds and Psychologist, counselling presence for their safety. Patient instructed to come to me with needs or concerns.

## 2018-06-28 NOTE — ED Provider Notes (Signed)
[facility] Emergency Department Provider Note    First MD Initiated Contact with Patient 06/28/18 2054     (approximate)  I have reviewed the triage vital signs and the nursing notes.   HISTORY  Chief Complaint Psychiatric Evaluation    HPI Anthony Luna is a 33 y.o. male below listed past medical history presents the ER for evaluation of anxiety.  States that earlier in the day started feeling suicidal and that he wanted run out in front of traffic but does not feel suicidal at this time.  Denies any hallucinations.  States he feels better since arriving the ER but would like to speak to psychiatrist.  Denies any substance abuse.  No chest pain.  No shortness of breath.  Did not overdose or missed any medications over the past several days.    Past Medical History:  Diagnosis Date  . Depression    Since moving into new apartment 1 year ago  . None to low serum cortisol response with adrenocorticotrophic hormone (ACTH) stimulation test   . Schizophrenia, paranoid type (HCC)    History reviewed. No pertinent family history. History reviewed. No pertinent surgical history. Patient Active Problem List   Diagnosis Date Noted  . Cannabis use disorder, severe, dependence (HCC) 11/30/2014  . Schizoaffective disorder, bipolar type (HCC) 11/30/2014  . Tobacco use disorder 11/30/2014      Prior to Admission medications   Medication Sig Start Date End Date Taking? Authorizing Provider  buPROPion (WELLBUTRIN XL) 150 MG 24 hr tablet Take 1 tablet (150 mg total) by mouth daily. 05/11/18   Pucilowska, Braulio Conte B, MD  cloZAPine (CLOZARIL) 50 MG tablet Take 7 tablets (350 mg total) by mouth at bedtime. 05/10/18   Pucilowska, Braulio Conte B, MD  Paliperidone Palmitate (INVEGA TRINZA) 819 MG/2.625ML SUSP Inject 819 mg into the muscle.    [provider]  propranolol (INDERAL) 10 MG tablet Take 1 tablet (10 mg total) by mouth 3 (three) times daily. 12/02/17    [name], [name], MD    Allergies Patient has no known allergies.    Social History Social History   Tobacco Use  . Smoking status: Current Every Day Smoker    Packs/day: 0.50    Types: Cigarettes    Start date: 11/29/2002  . Smokeless tobacco: Former Neurosurgeon  . Tobacco comment: does not want to quit  Substance Use Topics  . Alcohol use: Yes    Alcohol/week: 0.0 standard drinks    Comment: "Special occasions"  . Drug use: Yes    Frequency: 4.0 times per week    Types: Marijuana    Comment: "2 blunts"    Review of Systems Patient denies headaches, rhinorrhea, blurry vision, numbness, shortness of breath, chest pain, edema, cough, abdominal pain, nausea, vomiting, diarrhea, dysuria, fevers, rashes or hallucinations unless otherwise stated above in HPI. ____________________________________________   PHYSICAL EXAM:  VITAL SIGNS: Vitals:   06/28/18 2043  BP: (!) 128/91  Pulse: (!) 106  Resp: 16  Temp: 98.5 F (36.9 C)  SpO2: 96%    Constitutional: Alert and oriented.  Eyes: Conjunctivae are normal.  Head: Atraumatic. Nose: No congestion/rhinnorhea. Mouth/Throat: Mucous membranes are moist.   Neck: No stridor. Painless ROM.  Cardiovascular: Normal rate, regular rhythm. Grossly normal heart sounds.  Good peripheral circulation. Respiratory: Normal respiratory effort.  No retractions. Lungs CTAB. Gastrointestinal: Soft and nontender. No distention. No abdominal bruits. No CVA tenderness. Genitourinary:  Musculoskeletal: No lower extremity tenderness nor edema.  No joint  effusions. Neurologic:  Normal speech and language. No gross focal neurologic deficits are appreciated. No facial droop Skin:  Skin is warm, dry and intact. No rash noted. Psychiatric:  Speech and behavior are normal.  ____________________________________________   LABS (all labs ordered are listed, but only abnormal results are displayed)  Results for orders placed or performed during the  hospital encounter of 06/28/18 (from the past 24 hour(s))  CBC with Diff     Status: Abnormal   Collection Time: 06/28/18  8:45 PM  Result Value Ref Range   WBC 9.9 4.0 - 10.5 K/uL   RBC 5.03 4.22 - 5.81 MIL/uL   Hemoglobin 15.4 13.0 - 17.0 g/dL   HCT 20.9 47.0 - 96.2 %   MCV 89.1 80.0 - 100.0 fL   MCH 30.6 26.0 - 34.0 pg   MCHC 34.4 30.0 - 36.0 g/dL   RDW 83.6 62.9 - 47.6 %   Platelets 183 150 - 400 K/uL   nRBC 0.0 0.0 - 0.2 %   Neutrophils Relative % 65 %   Neutro Abs 6.4 1.7 - 7.7 K/uL   Lymphocytes Relative 21 %   Lymphs Abs 2.1 0.7 - 4.0 K/uL   Monocytes Relative 11 %   Monocytes Absolute 1.1 (H) 0.1 - 1.0 K/uL   Eosinophils Relative 3 %   Eosinophils Absolute 0.3 0.0 - 0.5 K/uL   Basophils Relative 0 %   Basophils Absolute 0.0 0.0 - 0.1 K/uL   Immature Granulocytes 0 %   Abs Immature Granulocytes 0.04 0.00 - 0.07 K/uL   ____________________________________________ _____________________________  RADIOLOGY   ____________________________________________   PROCEDURES  Procedure(s) performed:  Procedures    Critical Care performed: no ____________________________________________   INITIAL IMPRESSION / ASSESSMENT AND PLAN / ED COURSE  Pertinent labs & imaging results that were available during my care of the patient were reviewed by me and considered in my medical decision making (see chart for details).   DDX: Psychosis, delirium, medication effect, noncompliance, polysubstance abuse, Si, Hi, depression   Anthony Luna is a 33 y.o. who presents to the ED with for evaluation of anxiety.  Patient has psych history of schizophrenia.  Laboratory testing was ordered to evaluation for underlying electrolyte derangement or signs of underlying organic pathology to explain today's presentation.  Based on history and physical and laboratory evaluation, it appears that the patient's presentation is 2/2 underlying psychiatric disorder and will require further  evaluation and management by psychiatry.  Patient does not meet criteria for involuntary commitment at this time and agrees to voluntary evaluation.  Disposition pending psychiatric evaluation.       As part of my medical decision making, I reviewed the following data within the electronic MEDICAL RECORD NUMBER Nursing notes reviewed and incorporated, Labs reviewed, notes from prior ED visits.   ____________________________________________   FINAL CLINICAL IMPRESSION(S) / ED DIAGNOSES  Final diagnoses:  Anxiety      NEW MEDICATIONS STARTED DURING THIS VISIT:  New Prescriptions   No medications on file     Note:  This document was prepared using Dragon voice recognition software and may include unintentional dictation errors.    Willy Eddy, MD 06/28/18 2113

## 2018-06-28 NOTE — ED Notes (Signed)
Patient talking to SOC Psychiatrist. 

## 2018-06-29 NOTE — ED Notes (Signed)
Pt to nurse's station door stating he was feeling anxious and wanted his vital signes checked. VS WNL.    Maintained on 15 minute checks and observation by security camera for safety.

## 2018-06-29 NOTE — ED Notes (Signed)
Pt given lunch tray.

## 2018-06-29 NOTE — ED Notes (Signed)
Hourly rounding reveals patient sleeping in room. No complaints, stable, in no acute distress. Q15 minute rounds and monitoring via Security Cameras to continue. 

## 2018-06-29 NOTE — ED Notes (Signed)
Pt discharged home with group home staff (Ms. Dan Humphreys).  VS stable.  All belongings returned to patient. Discharge paperwork reviewed with patient. Pt signed hard copy of D/C paperwork. Pt denies SI/HI.

## 2018-06-29 NOTE — ED Notes (Signed)
RN called [facility] Luna of [facility] group home.  Ms. Anthony Luna is in [place] and cannot pick up patient until 5pm.

## 2018-06-29 NOTE — ED Notes (Signed)
Patient states he is unable to get a ride home till AM. Dr. Dolores Frame consulted and agreed to let patient DC in AM.

## 2018-06-29 NOTE — ED Notes (Signed)
Pt to nurse's station door. "I want to lay down, but the voices are telling me I need to stay up."   Pt encouraged to turn on TV in his room as a distraction. Pt reassured his group home would be here to pick him up in a couple of hours.   Maintained on 15 minute checks and observation by security camera for safety.

## 2018-06-29 NOTE — Progress Notes (Signed)
Clozaril consult:  33 yo M ordered clozapine  06/28/2018  ANC  6.4  Result submitted to Clozapine [facility]S.  Will check ANC weekly while inpatient.  Bari MantisKristin Aricka Goldberger PharmD Clinical Pharmacist 06/29/2018

## 2018-06-29 NOTE — ED Notes (Signed)
Pt given breakfast tray

## 2018-06-29 NOTE — ED Notes (Signed)
Patient asleep in room. No noted distress or abnormal behavior. Will continue 15 minute checks and observation by security cameras for safety. 

## 2018-06-29 NOTE — ED Provider Notes (Signed)
-----------------------------------------   2:05 AM on 06/29/2018 -----------------------------------------  Patient was evaluated by [facility] psychiatrist Dr. Hermelinda Medicus who recommends discharge home with follow-up.  No medication recommendations.  Does not meet criteria for IVC.  Strict return precautions given.  Patient verbalizes understanding agrees with plan of care.   Irean Hong, MD 06/29/18 [phone]

## 2018-06-29 NOTE — Discharge Instructions (Addendum)
Continue any medicines as directed by your doctor.  Return to the ER for worsening symptoms, feelings of hurting yourself or others, or other concerns.

## 2018-12-13 ENCOUNTER — Emergency Department
Admission: EM | Admit: 2018-12-13 | Discharge: 2018-12-15 | Disposition: A | Discharge status: Other Acute Inpatient Hospital | Payer: Medicaid Other | Attending: Student in an Organized Health Care Education/Training Program | Admitting: Student in an Organized Health Care Education/Training Program

## 2018-12-13 ENCOUNTER — Other Ambulatory Visit: Payer: Self-pay

## 2018-12-13 DIAGNOSIS — Z046 Encounter for general psychiatric examination, requested by authority: Secondary | ICD-10-CM | POA: Insufficient documentation

## 2018-12-13 DIAGNOSIS — R441 Visual hallucinations: Secondary | ICD-10-CM | POA: Insufficient documentation

## 2018-12-13 DIAGNOSIS — F1220 Cannabis dependence, uncomplicated: Secondary | ICD-10-CM | POA: Diagnosis present

## 2018-12-13 DIAGNOSIS — F17210 Nicotine dependence, cigarettes, uncomplicated: Secondary | ICD-10-CM | POA: Insufficient documentation

## 2018-12-13 DIAGNOSIS — R45851 Suicidal ideations: Secondary | ICD-10-CM | POA: Insufficient documentation

## 2018-12-13 DIAGNOSIS — Z79899 Other long term (current) drug therapy: Secondary | ICD-10-CM | POA: Insufficient documentation

## 2018-12-13 DIAGNOSIS — G4700 Insomnia, unspecified: Secondary | ICD-10-CM | POA: Diagnosis not present

## 2018-12-13 DIAGNOSIS — R440 Auditory hallucinations: Secondary | ICD-10-CM | POA: Insufficient documentation

## 2018-12-13 DIAGNOSIS — F250 Schizoaffective disorder, bipolar type: Secondary | ICD-10-CM | POA: Diagnosis present

## 2018-12-13 DIAGNOSIS — R443 Hallucinations, unspecified: Secondary | ICD-10-CM

## 2018-12-13 DIAGNOSIS — Z20828 Contact with and (suspected) exposure to other viral communicable diseases: Secondary | ICD-10-CM | POA: Insufficient documentation

## 2018-12-13 DIAGNOSIS — F22 Delusional disorders: Secondary | ICD-10-CM | POA: Insufficient documentation

## 2018-12-13 DIAGNOSIS — F17200 Nicotine dependence, unspecified, uncomplicated: Secondary | ICD-10-CM | POA: Diagnosis present

## 2018-12-13 LAB — COMPREHENSIVE METABOLIC PANEL
ALT: 22 U/L (ref 0–44)
AST: 19 U/L (ref 15–41)
Albumin: 4.4 g/dL (ref 3.5–5.0)
Alkaline Phosphatase: 74 U/L (ref 38–126)
Anion gap: 9 (ref 5–15)
BUN: 12 mg/dL (ref 6–20)
CO2: 25 mmol/L (ref 22–32)
Calcium: 9.5 mg/dL (ref 8.9–10.3)
Chloride: 106 mmol/L (ref 98–111)
Creatinine, Ser: 1.24 mg/dL (ref 0.61–1.24)
GFR calc Af Amer: >60 mL/min (ref >60)
GFR calc non Af Amer: >60 mL/min (ref >60)
Glucose, Bld: 132 mg/dL — ABNORMAL HIGH (ref 70–99)
Potassium: 3.7 mmol/L (ref 3.5–5.1)
Sodium: 140 mmol/L (ref 135–145)
Total Bilirubin: 0.8 mg/dL (ref 0.3–1.2)
Total Protein: 7.3 g/dL (ref 6.5–8.1)

## 2018-12-13 LAB — CBC
HCT: 47.8 % (ref 39.0–52.0)
Hemoglobin: 16.1 g/dL (ref 13.0–17.0)
MCH: 30.7 pg (ref 26.0–34.0)
MCHC: 33.7 g/dL (ref 30.0–36.0)
MCV: 91 fL (ref 80.0–100.0)
Platelets: 201 10*3/uL (ref 150–400)
RBC: 5.25 MIL/uL (ref 4.22–5.81)
RDW: 13.1 % (ref 11.5–15.5)
WBC: 10.9 10*3/uL — ABNORMAL HIGH (ref 4.0–10.5)
nRBC: 0 % (ref 0.0–0.2)

## 2018-12-13 LAB — URINE DRUG SCREEN, QUALITATIVE (ARMC ONLY)
Amphetamines, Ur Screen: NOT DETECTED
Barbiturates, Ur Screen: NOT DETECTED
Benzodiazepine, Ur Scrn: NOT DETECTED
Cannabinoid 50 Ng, Ur ~~LOC~~: NOT DETECTED
Cocaine Metabolite,Ur ~~LOC~~: NOT DETECTED
MDMA (Ecstasy)Ur Screen: NOT DETECTED
Methadone Scn, Ur: NOT DETECTED
Opiate, Ur Screen: NOT DETECTED
Phencyclidine (PCP) Ur S: NOT DETECTED
Tricyclic, Ur Screen: NOT DETECTED

## 2018-12-13 LAB — ETHANOL: Alcohol, Ethyl (B): <10 mg/dL (ref <10)

## 2018-12-13 LAB — SALICYLATE LEVEL: Salicylate Lvl: <7 mg/dL (ref 2.8–30.0)

## 2018-12-13 LAB — ACETAMINOPHEN LEVEL: Acetaminophen (Tylenol), Serum: <10 ug/mL — ABNORMAL LOW (ref 10–30)

## 2018-12-13 NOTE — ED Triage Notes (Signed)
Pt arrives to ED via POV from home voluntarily with c/o "hearing voices, having suicidal thoughts" and insomnia. Pt reports being seen for psych issues in the past, and taking r/x'd medications as directed. Pt is calm, cooperative. Pt denies plan at this time; will contract for safety. No ETOH or illegal drugs taken PTA.

## 2018-12-13 NOTE — ED Provider Notes (Signed)
[facility] Emergency Department Provider Note    First MD Initiated Contact with Patient 12/13/18 2143     (approximate)  I have reviewed the triage vital signs and the nursing notes.   HISTORY  Chief Complaint Mental Health Problem    HPI Anthony Luna is a 33 y.o. male ER for suicidal ideation as well as reportedly having hallucinations.  States he feels like people are trying to get him feels very paranoid.  Patient somewhat uncooperative with exam just pulling the covers over his head when I tried to speak with him.  Denies any pain.  No cough or shortness of breath.  No nausea or vomiting.    Past Medical History:  Diagnosis Date  . Depression    Since moving into new apartment 1 year ago  . None to low serum cortisol response with adrenocorticotrophic hormone (ACTH) stimulation test   . Schizophrenia, paranoid type ([place])    No family history on file. History reviewed. No pertinent surgical history. Patient Active Problem List   Diagnosis Date Noted  . Cannabis use disorder, severe, dependence ([place]) 11/30/2014  . Schizoaffective disorder, bipolar type ([place]) 11/30/2014  . Tobacco use disorder 11/30/2014      Prior to Admission medications   Medication Sig Start Date End Date Taking? Authorizing Provider  buPROPion (WELLBUTRIN XL) 150 MG 24 hr tablet Take 1 tablet (150 mg total) by mouth daily. 05/11/18   Pucilowska, Herma Ard B, MD  cloZAPine (CLOZARIL) 50 MG tablet Take 7 tablets (350 mg total) by mouth at bedtime. 05/10/18   Pucilowska, Herma Ard B, MD  Paliperidone Palmitate (INVEGA TRINZA) 819 MG/2.625ML SUSP Inject 819 mg into the muscle.    [provider]  propranolol (INDERAL) 10 MG tablet Take 1 tablet (10 mg total) by mouth 3 (three) times daily. 12/02/17   [name], Tyson Babinski, MD    Allergies Patient has no known allergies.    Social History Social History   Tobacco Use  . Smoking status: Current Every Day Smoker     Packs/day: 0.50    Types: Cigarettes    Start date: 11/29/2002  . Smokeless tobacco: Former Systems developer  . Tobacco comment: does not want to quit  Substance Use Topics  . Alcohol use: Yes    Alcohol/week: 0.0 standard drinks    Comment: "Special occasions"  . Drug use: Yes    Frequency: 4.0 times per week    Types: Marijuana    Comment: "2 blunts"    Review of Systems Patient denies headaches, rhinorrhea, blurry vision, numbness, shortness of breath, chest pain, edema, cough, abdominal pain, nausea, vomiting, diarrhea, dysuria, fevers, rashes or hallucinations unless otherwise stated above in HPI. ____________________________________________   PHYSICAL EXAM:  VITAL SIGNS: Vitals:   12/13/18 2126  BP: (!) 118/94  Pulse: (!) 117  Resp: 20  Temp: 99.1 F (37.3 C)  SpO2: 98%    Constitutional: Alert and oriented.  Eyes: Conjunctivae are normal.  Head: Atraumatic. Nose: No congestion/rhinnorhea. Mouth/Throat: Mucous membranes are moist.   Neck: No stridor. Painless ROM.  Cardiovascular: Normal rate, regular rhythm. Grossly normal heart sounds.  Good peripheral circulation. Respiratory: Normal respiratory effort.  No retractions. Lungs CTAB. Gastrointestinal: Soft and nontender. No distention. No abdominal bruits. No CVA tenderness. Genitourinary:  Musculoskeletal: No lower extremity tenderness nor edema.  No joint effusions. Neurologic:  Normal speech and language. No gross focal neurologic deficits are appreciated. No facial droop Skin:  Skin is warm, dry and intact. No rash  noted. Psychiatric: Mood and affect are withdrawn  ____________________________________________   LABS (all labs ordered are listed, but only abnormal results are displayed)  Results for orders placed or performed during the hospital encounter of 12/13/18 (from the past 24 hour(s))  Comprehensive metabolic panel     Status: Abnormal   Collection Time: 12/13/18  9:35 PM  Result Value Ref Range    Sodium 140 135 - 145 mmol/L   Potassium 3.7 3.5 - 5.1 mmol/L   Chloride 106 98 - 111 mmol/L   CO2 25 22 - 32 mmol/L   Glucose, Bld 132 (H) 70 - 99 mg/dL   BUN 12 6 - 20 mg/dL   Creatinine, Ser 4.091.24 0.61 - 1.24 mg/dL   Calcium 9.5 8.9 - 81.110.3 mg/dL   Total Protein 7.3 6.5 - 8.1 g/dL   Albumin 4.4 3.5 - 5.0 g/dL   AST 19 15 - 41 U/L   ALT 22 0 - 44 U/L   Alkaline Phosphatase 74 38 - 126 U/L   Total Bilirubin 0.8 0.3 - 1.2 mg/dL   GFR calc non Af Amer >60 >60 mL/min   GFR calc Af Amer >60 >60 mL/min   Anion gap 9 5 - 15  Ethanol     Status: None   Collection Time: 12/13/18  9:35 PM  Result Value Ref Range   Alcohol, Ethyl (B) <10 <10 mg/dL  Salicylate level     Status: None   Collection Time: 12/13/18  9:35 PM  Result Value Ref Range   Salicylate Lvl <7.0 2.8 - 30.0 mg/dL  Acetaminophen level     Status: Abnormal   Collection Time: 12/13/18  9:35 PM  Result Value Ref Range   Acetaminophen (Tylenol), Serum <10 (L) 10 - 30 ug/mL  cbc     Status: Abnormal   Collection Time: 12/13/18  9:35 PM  Result Value Ref Range   WBC 10.9 (H) 4.0 - 10.5 K/uL   RBC 5.25 4.22 - 5.81 MIL/uL   Hemoglobin 16.1 13.0 - 17.0 g/dL   HCT 91.447.8 78.239.0 - 95.652.0 %   MCV 91.0 80.0 - 100.0 fL   MCH 30.7 26.0 - 34.0 pg   MCHC 33.7 30.0 - 36.0 g/dL   RDW 21.313.1 08.611.5 - 57.815.5 %   Platelets 201 150 - 400 K/uL   nRBC 0.0 0.0 - 0.2 %  Urine Drug Screen, Qualitative     Status: None   Collection Time: 12/13/18  9:35 PM  Result Value Ref Range   Tricyclic, Ur Screen NONE DETECTED NONE DETECTED   Amphetamines, Ur Screen NONE DETECTED NONE DETECTED   MDMA (Ecstasy)Ur Screen NONE DETECTED NONE DETECTED   Cocaine Metabolite,Ur [place] NONE DETECTED NONE DETECTED   Opiate, Ur Screen NONE DETECTED NONE DETECTED   Phencyclidine (PCP) Ur S NONE DETECTED NONE DETECTED   Cannabinoid 50 Ng, Ur [place] NONE DETECTED NONE DETECTED   Barbiturates, Ur Screen NONE DETECTED NONE DETECTED   Benzodiazepine, Ur Scrn NONE DETECTED NONE DETECTED    Methadone Scn, Ur NONE DETECTED NONE DETECTED   ____________________________________________ ____________________________________________  RADIOLOGY   ____________________________________________   PROCEDURES  Procedure(s) performed:  Procedures    Critical Care performed: no ____________________________________________   INITIAL IMPRESSION / ASSESSMENT AND PLAN / ED COURSE  Pertinent labs & imaging results that were available during my care of the patient were reviewed by me and considered in my medical decision making (see chart for details).   DDX: Psychosis, delirium, medication effect, noncompliance, polysubstance abuse, Si,  Hi, depression   Anthony Luna is a 33 y.o. who presents to the ED with for evaluation of SI and hallucinations.  Patient has psych history of schizophrenia.  Laboratory testing was ordered to evaluation for underlying electrolyte derangement or signs of underlying organic pathology to explain today's presentation.  Based on history and physical and laboratory evaluation, it appears that the patient's presentation is 2/2 underlying psychiatric disorder and will require further evaluation and management by inpatient psychiatry.  Patient was  made an IVC due to SI.  Disposition pending psychiatric evaluation.     The patient was evaluated in Emergency Department today for the symptoms described in the history of present illness. He/she was evaluated in the context of the global COVID-19 pandemic, which necessitated consideration that the patient might be at risk for infection with the SARS-CoV-2 virus that causes COVID-19. Institutional protocols and algorithms that pertain to the evaluation of patients at risk for COVID-19 are in a state of rapid change based on information released by regulatory bodies including the CDC and federal and state organizations. These policies and algorithms were followed during the patient's care in the ED.   As part  of my medical decision making, I reviewed the following data within the electronic MEDICAL RECORD NUMBER Nursing notes reviewed and incorporated, Labs reviewed, notes from prior ED visits and [place] Controlled Substance Database   ____________________________________________   FINAL CLINICAL IMPRESSION(S) / ED DIAGNOSES  Final diagnoses:  Paranoia (HCC)  Hallucination      NEW MEDICATIONS STARTED DURING THIS VISIT:  New Prescriptions   No medications on file     Note:  This document was prepared using Dragon voice recognition software and may include unintentional dictation errors.    Willy Eddyobinson, Emre Stock, MD 12/13/18 [phone]

## 2018-12-13 NOTE — ED Notes (Signed)
Pt arrives from group home, states he has had SI thoughts for about a month now. Pt has auditory and visual hallucinations, pt states he thinks "everyone is out to get him". Pt is calm and cooperative at this time. Pt given water, denies any pain. NAD.

## 2018-12-13 NOTE — ED Notes (Signed)
Pt belongings: 1 pr orange/black high-top tennis shoes, 1 pr black/gray athletic pants, 1 pr blue plaid boxers, 1 pr white socks, 1 green t-shirt. No cell phone, wallet, cash, or jewelry brought with pt today. Belongings bagged and labeled per policy.

## 2018-12-14 DIAGNOSIS — R45851 Suicidal ideations: Secondary | ICD-10-CM

## 2018-12-14 LAB — CBC WITH DIFFERENTIAL/PLATELET
Abs Immature Granulocytes: 0.02 10*3/uL (ref 0.00–0.07)
Basophils Absolute: 0.1 10*3/uL (ref 0.0–0.1)
Basophils Relative: 1 %
Eosinophils Absolute: 0.3 10*3/uL (ref 0.0–0.5)
Eosinophils Relative: 3 %
HCT: 44.4 % (ref 39.0–52.0)
Hemoglobin: 15.3 g/dL (ref 13.0–17.0)
Immature Granulocytes: 0 %
Lymphocytes Relative: 37 %
Lymphs Abs: 3.2 10*3/uL (ref 0.7–4.0)
MCH: 30.9 pg (ref 26.0–34.0)
MCHC: 34.5 g/dL (ref 30.0–36.0)
MCV: 89.7 fL (ref 80.0–100.0)
Monocytes Absolute: 0.9 10*3/uL (ref 0.1–1.0)
Monocytes Relative: 11 %
Neutro Abs: 4.1 10*3/uL (ref 1.7–7.7)
Neutrophils Relative %: 48 %
Platelets: 167 10*3/uL (ref 150–400)
RBC: 4.95 MIL/uL (ref 4.22–5.81)
RDW: 13.2 % (ref 11.5–15.5)
WBC: 8.5 10*3/uL (ref 4.0–10.5)
nRBC: 0 % (ref 0.0–0.2)

## 2018-12-14 LAB — SARS CORONAVIRUS 2 BY RT PCR (HOSPITAL ORDER, PERFORMED IN ~~LOC~~ HOSPITAL LAB): SARS Coronavirus 2: NEGATIVE

## 2018-12-14 MED ORDER — MELATONIN 5 MG PO TABS
5.0000 mg | ORAL_TABLET | Freq: Every day | ORAL | Status: DC
  Administered 2018-12-14: 5 mg via ORAL
  Filled 2018-12-14 (×2): qty 1

## 2018-12-14 MED ORDER — VITAMIN D (ERGOCALCIFEROL) 1.25 MG (50000 UNIT) PO CAPS
50000.0000 [IU] | ORAL_CAPSULE | ORAL | Status: DC
  Administered 2018-12-14: 50000 [IU] via ORAL
  Filled 2018-12-14: qty 1

## 2018-12-14 MED ORDER — BUPROPION HCL ER (XL) 150 MG PO TB24
150.0000 mg | ORAL_TABLET | Freq: Every day | ORAL | Status: DC
  Administered 2018-12-14 – 2018-12-15 (×2): 150 mg via ORAL
  Filled 2018-12-14 (×2): qty 1

## 2018-12-14 MED ORDER — CLOZAPINE 100 MG PO TABS: 350.0000 mg | ORAL_TABLET | Freq: Every day | ORAL | Status: DC

## 2018-12-14 MED ORDER — FLUTICASONE PROPIONATE 50 MCG/ACT NA SUSP
2.0000 | Freq: Every day | NASAL | Status: DC
  Administered 2018-12-14 – 2018-12-15 (×2): 2 via NASAL
  Filled 2018-12-14: qty 16

## 2018-12-14 MED ORDER — PROPRANOLOL HCL 10 MG PO TABS
10.0000 mg | ORAL_TABLET | Freq: Three times a day (TID) | ORAL | Status: DC
  Administered 2018-12-14 – 2018-12-15 (×2): 10 mg via ORAL
  Filled 2018-12-14 (×2): qty 1

## 2018-12-14 NOTE — ED Notes (Signed)
TTS speaking with patient via video call.

## 2018-12-14 NOTE — ED Notes (Signed)
Patient assigned to appropriate care area   Introduced self to pt  Patient oriented to unit/care area: Informed that, for their safety, care areas are designed for safety and visiting and phone hours explained to patient. Patient verbalizes understanding, and verbal contract for safety obtained  Environment secured  

## 2018-12-14 NOTE — ED Notes (Signed)
Breakfast tray was given.  

## 2018-12-14 NOTE — Progress Notes (Signed)
CLOZAPINE MONITORING: Patient is a 33yo male on clozapine 350mg  qHS prior to admission. Medication to be continued.  [place] [id number]  Verified eligibility with Clozapine REMS program. Patient is eligible and requires weekly monitoring of ANC.  Next lab check will be 12/21/18, orders have been placed.  Paulina Fusi, PharmD, BCPS 12/14/2018 7:53 AM

## 2018-12-14 NOTE — ED Notes (Signed)
Pt given sandwich tray and drink. 

## 2018-12-14 NOTE — ED Notes (Signed)
Patient laying in bed with eyes closed. Respirations even and unlabored.

## 2018-12-14 NOTE — Consult Note (Signed)
[facility]-to[facility]Face [facility] Consult   Reason for Consult: Suicidal ideation Referring Physician: Dr. Roxan Hockeyobinson Patient Identification: Anthony Luna MRN:  [id number] Principal Diagnosis: <principal problem not specified> Diagnosis:  Active Problems:   Cannabis use disorder, severe, dependence (HCC)   Schizoaffective disorder, bipolar type (HCC)   Tobacco use disorder   Suicidal ideation   Total Time spent with patient: 1 hour  Subjective: " I hope. I can stop hearing the voices telling me to hurt myself." Anthony Luna is a 33 y.o. male patient [facility] from group home.  Where he was presented to the ED stating he was hearing voices and having suicidal thoughts of wanting to harm himself by hanging or running into traffic.  The patient states the voices have gotten worse over the past few days.  "I just did not want to do anything to hurt myself."  "That is why I came in to get some help."  The patient admits to be medication compliant.  He states that he wants to stop smoking cigarettes.  He denies any alcohol or drug use but admits to tobacco use.  The patient was seen face-to-face by this provider; chart reviewed and consulted with Dr. Roxan Hockeyobinson on 12/13/2018 due to the care of the patient. It was discussed with the provider that the patient does meet criteria to be admitted to the inpatient unit once a bed becomes available. On evaluation the patient is alert and oriented x4, calm and cooperative, and mood-congruent with affect. The patient does not appear to be responding to internal or external stimuli. Neither is the patient presenting with any delusional thinking. The patient denies auditory or visual hallucinations currently, but admits to having it the past few days. The patient denies admits suicidal and self-harm ideations, but denies homicidal ideations. The patient is not presenting with any psychotic or paranoid behaviors. During an encounter with  the patient, he was able to answer questions appropriately. Collateral was obtained by Ms. [name] [phone] group home owner who expresses concerns for the patient's suicidal thoughts with a plan to hang himself.  She stated that the patient became withdrawn the past couple days.  She questioned him as to what is going on.  He voiced, that he has been hearing voices telling him to hang himself or to run into traffic.   Ms. Dan HumphreysMebane, states the patient takes Eyvonne Mechanicnvega Trinza 819 mg injectable and was not has given on 6- 12- 2020.  Plan: The patient is a safety risk to self and does require psychiatric inpatient admission for stabilization and treatment.  The patient will continue on his home medication regimen. HPI: Per Dr. Roxan Hockeyobinson;  Anthony Luna is a 33 y.o. male ER for suicidal ideation as well as reportedly having hallucinations.  States he feels like people are trying to get him feels very paranoid.  Patient somewhat uncooperative with exam just pulling the covers over his head when I tried to speak with him.  Denies any pain.  No cough or shortness of breath.  No nausea or vomiting.  Past Psychiatric History:  Depression Schizophrenia, paranoid type (HCC) Cannabis use disorder, severe, dependence (HCC) Schizoaffective disorder, bipolar type (HCC) Tobacco use disorder  Risk to Self:  Yes Risk to Others:  No Prior Inpatient Therapy:  Yes Prior Outpatient Therapy:  Yes  Past Medical History:  Past Medical History:  Diagnosis Date  . Depression    Since moving into new apartment 1 year ago  .  None to low serum cortisol response with adrenocorticotrophic hormone (ACTH) stimulation test   . Schizophrenia, paranoid type (HCC)    History reviewed. No pertinent surgical history. Family History: No family history on file. Family Psychiatric  History: History reviewed. No pertinent family psychiatric history Social History:  Social History   Substance and Sexual Activity   Alcohol Use Yes  . Alcohol/week: 0.0 standard drinks   Comment: "Special occasions"     Social History   Substance and Sexual Activity  Drug Use Yes  . Frequency: 4.0 times per week  . Types: Marijuana   Comment: "2 blunts"    Social History   Socioeconomic History  . Marital status: Single    Spouse name: Not on file  . Number of children: Not on file  . Years of education: Not on file  . Highest education level: Not on file  Occupational History  . Not on file  Social Needs  . Financial resource strain: Not on file  . Food insecurity    Worry: Not on file    Inability: Not on file  . Transportation needs    Medical: Not on file    Non-medical: Not on file  Tobacco Use  . Smoking status: Current Every Day Smoker    Packs/day: 0.50    Types: Cigarettes    Start date: 11/29/2002  . Smokeless tobacco: Former NeurosurgeonUser  . Tobacco comment: does not want to quit  Substance and Sexual Activity  . Alcohol use: Yes    Alcohol/week: 0.0 standard drinks    Comment: "Special occasions"  . Drug use: Yes    Frequency: 4.0 times per week    Types: Marijuana    Comment: "2 blunts"  . Sexual activity: Not on file  Lifestyle  . Physical activity    Days per week: Not on file    Minutes per session: Not on file  . Stress: Not on file  Relationships  . Social Musicianconnections    Talks on phone: Not on file    Gets together: Not on file    Attends religious service: Not on file    Active member of club or organization: Not on file    Attends meetings of clubs or organizations: Not on file    Relationship status: Not on file  Other Topics Concern  . Not on file  Social History Narrative  . Not on file   Additional Social History:    Allergies:  No Known Allergies  Labs:  Results for orders placed or performed during the hospital encounter of 12/13/18 (from the past 48 hour(s))  Comprehensive metabolic panel     Status: Abnormal   Collection Time: 12/13/18  9:35 PM  Result  Value Ref Range   Sodium 140 135 - 145 mmol/L   Potassium 3.7 3.5 - 5.1 mmol/L   Chloride 106 98 - 111 mmol/L   CO2 25 22 - 32 mmol/L   Glucose, Bld 132 (H) 70 - 99 mg/dL   BUN 12 6 - 20 mg/dL   Creatinine, Ser 4.091.24 0.61 - 1.24 mg/dL   Calcium 9.5 8.9 - 81.110.3 mg/dL   Total Protein 7.3 6.5 - 8.1 g/dL   Albumin 4.4 3.5 - 5.0 g/dL   AST 19 15 - 41 U/L   ALT 22 0 - 44 U/L   Alkaline Phosphatase 74 38 - 126 U/L   Total Bilirubin 0.8 0.3 - 1.2 mg/dL   GFR calc non Af Amer >60 >60 mL/min  GFR calc Af Amer >60 >60 mL/min   Anion gap 9 5 - 15    Comment: Performed at [facility], [address] [place], [place] [address]  Ethanol     Status: None   Collection Time: 12/13/18  9:35 PM  Result Value Ref Range   Alcohol, Ethyl (B) <10 <10 mg/dL    Comment: (NOTE) Lowest detectable limit for serum alcohol is 10 mg/dL. For medical purposes only. Performed at [facility], [address] [place], [place] [address]   Salicylate level     Status: None   Collection Time: 12/13/18  9:35 PM  Result Value Ref Range   Salicylate Lvl <7.0 2.8 - 30.0 mg/dL    Comment: Performed at [facility], [address] [place], [place] [address]  Acetaminophen level     Status: Abnormal   Collection Time: 12/13/18  9:35 PM  Result Value Ref Range   Acetaminophen (Tylenol), Serum <10 (L) 10 - 30 ug/mL    Comment: (NOTE) Therapeutic concentrations vary significantly. A range of 10-30 ug/mL  may be an effective concentration for many patients. However, some  are best treated at concentrations outside of this range. Acetaminophen concentrations >150 ug/mL at 4 hours after ingestion  and >50 ug/mL at 12 hours after ingestion are often associated with  toxic reactions. Performed at [facility], [address] [place], [place] [address]   cbc     Status: Abnormal   Collection Time: 12/13/18  9:35 PM  Result Value Ref Range   WBC 10.9 (H) 4.0 - 10.5 K/uL   RBC  5.25 4.22 - 5.81 MIL/uL   Hemoglobin 16.1 13.0 - 17.0 g/dL   HCT 29.547.8 62.139.0 - 30.852.0 %   MCV 91.0 80.0 - 100.0 fL   MCH 30.7 26.0 - 34.0 pg   MCHC 33.7 30.0 - 36.0 g/dL   RDW 65.713.1 84.611.5 - 96.215.5 %   Platelets 201 150 - 400 K/uL   nRBC 0.0 0.0 - 0.2 %    Comment: Performed at [facility], [address] [place], [place] [address]  Urine Drug Screen, Qualitative     Status: None   Collection Time: 12/13/18  9:35 PM  Result Value Ref Range   Tricyclic, Ur Screen NONE DETECTED NONE DETECTED   Amphetamines, Ur Screen NONE DETECTED NONE DETECTED   MDMA (Ecstasy)Ur Screen NONE DETECTED NONE DETECTED   Cocaine Metabolite,Ur [place] NONE DETECTED NONE DETECTED   Opiate, Ur Screen NONE DETECTED NONE DETECTED   Phencyclidine (PCP) Ur S NONE DETECTED NONE DETECTED   Cannabinoid 50 Ng, Ur [place] NONE DETECTED NONE DETECTED   Barbiturates, Ur Screen NONE DETECTED NONE DETECTED   Benzodiazepine, Ur Scrn NONE DETECTED NONE DETECTED   Methadone Scn, Ur NONE DETECTED NONE DETECTED    Comment: (NOTE) Tricyclics + metabolites, urine    Cutoff 1000 ng/mL Amphetamines + metabolites, urine  Cutoff 1000 ng/mL MDMA (Ecstasy), urine              Cutoff 500 ng/mL Cocaine Metabolite, urine          Cutoff 300 ng/mL Opiate + metabolites, urine        Cutoff 300 ng/mL Phencyclidine (PCP), urine         Cutoff 25 ng/mL Cannabinoid, urine                 Cutoff 50 ng/mL Barbiturates + metabolites, urine  Cutoff 200 ng/mL Benzodiazepine, urine  Cutoff 200 ng/mL Methadone, urine                   Cutoff 300 ng/mL The urine drug screen provides only a preliminary, unconfirmed analytical test result and should not be used for non-medical purposes. Clinical consideration and professional judgment should be applied to any positive drug screen result due to possible interfering substances. A more specific alternate chemical method must be used in order to obtain a confirmed analytical result. Gas  chromatography / mass spectrometry (GC/MS) is the preferred confirmat ory method. Performed at [facility], [address] [place],  [address]     No current facility-administered medications for this encounter.    Current Outpatient Medications  Medication Sig Dispense Refill  . buPROPion (WELLBUTRIN XL) 150 MG 24 hr tablet Take 1 tablet (150 mg total) by mouth daily. 30 tablet 1  . cloZAPine (CLOZARIL) 50 MG tablet Take 7 tablets (350 mg total) by mouth at bedtime. 210 tablet 1  . Paliperidone Palmitate (INVEGA TRINZA) 819 MG/2.625ML SUSP Inject 819 mg into the muscle.    . propranolol (INDERAL) 10 MG tablet Take 1 tablet (10 mg total) by mouth 3 (three) times daily. 90 tablet 0    Musculoskeletal: Strength & Muscle Tone: within normal limits Gait & Station: normal Patient leans: N/A  Psychiatric Specialty Exam: Physical Exam  Nursing note and vitals reviewed. Constitutional: He is oriented to person, place, and time. He appears well-developed and well-nourished.  HENT:  Head: Normocephalic.  Eyes: Pupils are equal, round, and reactive to light. Conjunctivae are normal.  Neck: Normal range of motion. Neck supple.  Cardiovascular: Normal rate and regular rhythm.  Respiratory: Effort normal and breath sounds normal.  Musculoskeletal: Normal range of motion.  Neurological: He is alert and oriented to person, place, and time.  Skin: Skin is warm and dry.    Review of Systems  Psychiatric/Behavioral: Positive for depression, hallucinations and suicidal ideas. The patient is nervous/anxious.     Blood pressure (!) 118/94, pulse (!) 117, temperature 99.1 F (37.3 C), temperature source Oral, resp. rate 20, height 6' (1.829 m), weight 86.6 kg, SpO2 98 %.Body mass index is 25.9 kg/m.  General Appearance: Fairly Groomed  Eye Contact:  Minimal  Speech:  Clear and Coherent  Volume:  Normal  Mood:  Anxious and Depressed  Affect:  Depressed  Thought Process:   Coherent  Orientation:  Full (Time, Place, and Person)  Thought Content:  Logical  Suicidal Thoughts:  Yes.  with intent/plan  Homicidal Thoughts:  No  Memory:  Immediate;   Fair Recent;   Fair  Judgement:  Poor  Insight:  Lacking  Psychomotor Activity:  Normal  Concentration:  Concentration: Fair and Attention Span: Fair  Recall:  Good  Fund of Knowledge:  Fair  Language:  Good  Akathisia:  Negative  Handed:  Right  AIMS (if indicated):     Assets:  Desire for Improvement Social Support  ADL's:  Intact  Cognition:  WNL  Sleep:   Okay     Treatment Plan Summary: Daily contact with patient to assess and evaluate symptoms and progress in treatment, Medication management and Plan The patient does meet requirement for psychiatric inpatient admission once a bed becomes available.  Disposition: Supportive therapy provided about ongoing stressors. The patient does meet psychiatric inpatient admission for treatment and stabilization.  Lamont Dowdy, NP 12/14/2018 12:23 AM

## 2018-12-14 NOTE — ED Notes (Signed)
Referral information for Psychiatric Hospitalization faxed to;   . [facility] ([phone]),   . [facility] [phone] -or- [phone]) [phone]  . [facility] ([phone], [phone], [phone] or [phone]),   . [facility] ([phone] or [phone])  . [facility] ([phone]),   . [facility] ([phone]),   . [facility] ([phone]) 
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
## 2018-12-15 LAB — CLOZAPINE (CLOZARIL)
Clozapine Lvl: 961 ng/mL — ABNORMAL HIGH (ref 350–650)
NorClozapine: 500 ng/mL
Total(Cloz+Norcloz): 1461 ng/mL

## 2018-12-15 NOTE — ED Notes (Signed)
ACSD called [id number]

## 2018-12-15 NOTE — ED Notes (Signed)
EMTALA reviewed by charge RN 

## 2018-12-15 NOTE — ED Notes (Signed)
Hourly rounding reveals patient sleeping in room. No complaints, stable, in no acute distress. Q15 minute rounds and monitoring via Security Cameras to continue. 

## 2018-12-15 NOTE — ED Notes (Signed)
ED [facility] Is the patient under IVC or is there intent for IVC: Yes.   Is the patient medically cleared: Yes.   Is there vacancy in the ED BHU: Yes.   Is the population mix appropriate for patient: Yes.   Is the patient awaiting placement in inpatient or outpatient setting: Yes.   Has the patient had a psychiatric consult: Yes.   Survey of unit performed for contraband, proper placement and condition of furniture, tampering with fixtures in bathroom, shower, and each patient room: Yes.  ; Findings:  APPEARANCE/BEHAVIOR Calm and cooperative NEURO ASSESSMENT Orientation: oriented x3  Denies pain Hallucinations: AH/VH Speech: Normal Gait: normal RESPIRATORY ASSESSMENT Even  Unlabored respirations  CARDIOVASCULAR ASSESSMENT Pulses equal   regular rate  Skin warm and dry   GASTROINTESTINAL ASSESSMENT no GI complaint EXTREMITIES Full ROM  PLAN OF CARE Provide calm/safe environment. Vital signs assessed twice daily. ED BHU Assessment once each 12-hour shift. Collaborate with TTS if available   Assure the ED provider has rounded once each shift. Provide and encourage hygiene. Provide redirection as needed. Assess for escalating behavior; address immediately and inform ED provider.  Assess family dynamic and appropriateness for visitation as needed: Yes.  ; If necessary, describe findings:  Educate the patient/family about BHU procedures/visitation: Yes.  ; If necessary, describe findings:

## 2018-12-15 NOTE — BH Assessment (Addendum)
Patient has been accepted to [facility].  Patient assigned to [facility]. Accepting physician is Dr. Thomas Cornwall.  Call report to [phone].  Representative was Allison.   ER Staff is aware of it:  Annette, ER Secretary  Dr. Issacs, ER MD  Amy T., Patient's Nurse      

## 2018-12-15 NOTE — ED Notes (Signed)
BEHAVIORAL HEALTH ROUNDING Patient sleeping: No. Patient alert and oriented: yes Behavior appropriate: Yes.  ; If no, describe:  Nutrition and fluids offered: yes Toileting and hygiene offered: Yes  Sitter present: q15 minute observations and security camera monitoring Law enforcement present: Yes  ODS  

## 2018-12-15 NOTE — ED Notes (Signed)
Patient observed lying in bed with eyes closed  Even, unlabored respirations observed   NAD pt appears to be sleeping  I will continue to monitor along with every 15 minute visual observations and ongoing security camera monitoring    

## 2018-12-15 NOTE — ED Notes (Signed)

## 2018-12-15 NOTE — ED Notes (Signed)
Pt given supplies to take a shower.  

## 2018-12-15 NOTE — ED Provider Notes (Signed)
-----------------------------------------   6:24 AM on 12/15/2018 -----------------------------------------   Blood pressure 118/79, pulse (!) 107, temperature 98.3 F (36.8 C), temperature source Oral, resp. rate 17, height 1.829 m (6'), weight 86.6 kg, SpO2 97 %.  The patient is calm and cooperative at this time.  There have been no acute events since the last update.  Awaiting disposition plan from Behavioral Medicine team.   Hinda Kehr, MD 12/15/18 [phone]

## 2018-12-15 NOTE — ED Notes (Signed)
BEHAVIORAL HEALTH ROUNDING Patient sleeping: Yes.   Patient alert and oriented: eyes closed  Appears asleep Behavior appropriate: Yes.  ; If no, describe:  Nutrition and fluids offered: Yes  Toileting and hygiene offered: sleeping Sitter present: q 15 minute observations and security camera monitoring Law enforcement present: yes  ODS 

## 2019-01-03 ENCOUNTER — Emergency Department
Admission: EM | Admit: 2019-01-03 | Discharge: 2019-01-03 | Disposition: A | Discharge status: Home / Self Care | Payer: Medicaid Other | Attending: Emergency Medicine | Admitting: Emergency Medicine

## 2019-01-03 ENCOUNTER — Other Ambulatory Visit: Payer: Self-pay

## 2019-01-03 DIAGNOSIS — F419 Anxiety disorder, unspecified: Secondary | ICD-10-CM | POA: Diagnosis present

## 2019-01-03 DIAGNOSIS — F17210 Nicotine dependence, cigarettes, uncomplicated: Secondary | ICD-10-CM | POA: Insufficient documentation

## 2019-01-03 DIAGNOSIS — Z79899 Other long term (current) drug therapy: Secondary | ICD-10-CM | POA: Diagnosis not present

## 2019-01-03 DIAGNOSIS — F250 Schizoaffective disorder, bipolar type: Secondary | ICD-10-CM | POA: Diagnosis not present

## 2019-01-03 NOTE — ED Notes (Signed)
Pt given water per request

## 2019-01-03 NOTE — ED Notes (Signed)
Pt group home called at this time for dischrage, states will be here shortly. Encouraged him to call hospital when he is close so pt is ready

## 2019-01-03 NOTE — ED Notes (Signed)
Pt given to group home staff , [name] at discharge by this RN

## 2019-01-03 NOTE — ED Notes (Signed)
Pt will wait for ride in [facility] bed

## 2019-01-03 NOTE — BH Assessment (Signed)
Assessment Note  Anthony BarthelBrandon Wayne Luna is an 33 y.o. male who presents to the ER, from his group home, after he had an anxiety attack. He states, his heart was beating fast and it was difficult to breathe. Patient states, he was recently inpatient with [facility] and since his discharge he's been doing well. Patient asked writer, "Can I go home."  Per the Group Home [name]), patient have history of panic attacks and had one today. He also reported he was having A/H telling him to harm his self. Patient was [facility] inpatient [facility] and was discharged this past Tuesday (12/28/2018). "He's doing good until today."  During the interview, the patient was calm, cooperative and pleasant. He was able to provide appropriate answers to the questions. Patient is well known to the ER due to similar presentation. Patient does not have a history of self-harm or gestures. With this Clinical research associatewriter, patient denies SI/HI and AV/H.  Diagnosis: Schizophrenia  Past Medical History:  Past Medical History:  Diagnosis Date  . Depression    Since moving into new apartment 1 year ago  . None to low serum cortisol response with adrenocorticotrophic hormone (ACTH) stimulation test   . Schizophrenia, paranoid type (HCC)     History reviewed. No pertinent surgical history.  Family History: No family history on file.  Social History:  reports that he has been smoking cigarettes. He started smoking about 16 years ago. He has been smoking about 0.50 packs per day. He has quit using smokeless tobacco. He reports current alcohol use. He reports current drug use. Frequency: 4.00 times per week. Drug: Marijuana.  Additional Social History:  Alcohol / Drug Use Pain Medications: See PTA Prescriptions: See PTA Over the Counter: See PTA History of alcohol / drug use?: No history of alcohol / drug abuse  CIWA: CIWA-Ar BP: 111/74 Pulse Rate: (!) 121 COWS:    Allergies: No Known  Allergies  Home Medications: (Not in a hospital admission)   OB/GYN Status:  No LMP for male patient.  General Assessment Data [facility] Assessment: In system Is this a Tele or Face-to-Face Assessment?: Face-to-Face Is this an Initial Assessment or a Re-assessment for this encounter?: Initial Assessment Patient Accompanied by:: N/A Language Other than English: No Living Arrangements: In Group Home: (Comment: Name of Group Home)([facility]) What gender do you identify as?: Male Marital status: Single Pregnancy Status: No Living Arrangements: Group Home([facility]) Can pt return to current living arrangement?: Yes Admission Status: Voluntary Is patient capable of signing voluntary admission?: Yes Referral Source: Self/Family/Friend Insurance type: Medicaid  Medical Screening Exam [facility] Walk-in ONLY) Medical Exam completed: Yes  Crisis Care Plan Living Arrangements: Group Home([facility]) Legal Guardian: Other:(Self) Name of Psychiatrist: Dr. Mestler([facility]) Name of Therapist: [name] - [facility]  Education Status Is patient currently in school?: No  Risk to self with the past 6 months Suicidal Ideation: No Has patient been a risk to self within the past 6 months prior to admission? : No Suicidal Intent: No Has patient had any suicidal intent within the past 6 months prior to admission? : No Is patient at risk for suicide?: No Suicidal Plan?: No Has patient had any suicidal plan within the past 6 months prior to admission? : No Access to Means: No What has been your use of drugs/alcohol within the last 12 months?: Reports of none Previous Attempts/Gestures: No How many times?: 0 Other Self Harm  Risks: Reports of none Triggers for Past Attempts: None known Intentional Self Injurious Behavior: None Family Suicide History: No Recent stressful life event(s): Other (Comment) Persecutory  voices/beliefs?: No Depression: Yes Depression Symptoms: Isolating Substance abuse history and/or treatment for substance abuse?: No Suicide prevention information given to non-admitted patients: Not applicable  Risk to Others within the past 6 months Homicidal Ideation: No Does patient have any lifetime risk of violence toward others beyond the six months prior to admission? : No Thoughts of Harm to Others: No Current Homicidal Intent: No Current Homicidal Plan: No Access to Homicidal Means: No Identified Victim: Reports of none History of harm to others?: No Assessment of Violence: None Noted Violent Behavior Description: Reports of none Does patient have access to weapons?: No Criminal Charges Pending?: No Does patient have a court date: No Is patient on probation?: No  Psychosis Hallucinations: Auditory Delusions: None noted  Mental Status Report Appearance/Hygiene: Unremarkable, In scrubs Eye Contact: Fair Motor Activity: Freedom of movement, Unremarkable Speech: Logical/coherent, Unremarkable Level of Consciousness: Alert Mood: Pleasant, Sad Affect: Appropriate to circumstance Anxiety Level: Minimal Thought Processes: Coherent, Relevant Judgement: Partial Orientation: Person, Place, Time, Situation, Appropriate for developmental age Obsessive Compulsive Thoughts/Behaviors: None  Cognitive Functioning Concentration: Normal Memory: Recent Intact, Remote Intact Is patient IDD: No Is IQ score available?: No Insight: Fair Impulse Control: Fair Appetite: Good Have you had any weight changes? : No Change Sleep: No Change Total Hours of Sleep: 8 Vegetative Symptoms: None  ADLScreening [facility] Assessment Services) Patient's cognitive ability adequate to safely complete daily activities?: Yes Patient able to express need for assistance with ADLs?: Yes Independently performs ADLs?: Yes (appropriate for developmental age)  Prior Inpatient Therapy Prior Inpatient  Therapy: Yes Prior Therapy Dates: Multiple Hospitalizations Prior [facility] Reason for Treatment: Schizoprenia  Prior Outpatient Therapy Prior Outpatient Therapy: Yes Prior Therapy Dates: Current Prior Therapy Facilty/Provider(s): [facility] Reason for Treatment: Schizophrenia Does patient have an ACCT team?: No Does patient have Intensive In-House Services?  : No Does patient have [facility] services? : No Does patient have P4CC services?: No  ADL Screening (condition at time of admission) Patient's cognitive ability adequate to safely complete daily activities?: Yes Is the patient deaf or have difficulty hearing?: No Does the patient have difficulty seeing, even when wearing glasses/contacts?: No Does the patient have difficulty concentrating, remembering, or making decisions?: No Patient able to express need for assistance with ADLs?: Yes Does the patient have difficulty dressing or bathing?: No Independently performs ADLs?: Yes (appropriate for developmental age) Does the patient have difficulty walking or climbing stairs?: No Weakness of Legs: None Weakness of Arms/Hands: None  Home Assistive Devices/Equipment Home Assistive Devices/Equipment: None  Therapy Consults (therapy consults require a physician order) PT Evaluation Needed: No OT Evalulation Needed: No SLP Evaluation Needed: No Abuse/Neglect Assessment (Assessment to be complete while patient is alone) Abuse/Neglect Assessment Can Be Completed: Yes Physical Abuse: Denies Verbal Abuse: Denies Sexual Abuse: Denies Exploitation of patient/patient's resources: Denies Self-Neglect: Denies Values / Beliefs Cultural Requests During Hospitalization: None Spiritual Requests During Hospitalization: None Consults Spiritual Care Consult Needed: No Social Work Consult Needed: No Merchant navy officerAdvance Directives (For Healthcare) Does Patient Have a Medical Advance Directive?: No        Child/Adolescent Assessment Running Away Risk: Denies(Patient is an adult)  Disposition:  Disposition Initial Assessment Completed for this Encounter: Yes  On Site Evaluation by:   Reviewed with Physician:    Lilyan Gilfordalvin J. Kalla Watson MS, LCAS, LCMHC, NCC,  CCSI Therapeutic Triage Specialist 01/03/2019 1:36 PM

## 2019-01-03 NOTE — ED Triage Notes (Signed)
Here voluntarily with group home staff c/o anxiety X 1 week. Was discharged from [facility] last Tuesday and has been feeling anxious since. Denies SI or HI. States that he feels better from previous SI thoughts that admitted pt to [facility]. Pt reports he is his own guardian. Appropriate, cooperative, moving legs restlessly. Pt alert and oriented X4, active, cooperative, pt in NAD. RR even and unlabored, color WNL.

## 2019-01-03 NOTE — Consult Note (Signed)
[facility] ED Discharge  01/03/2019 2:14 PM Anthony Luna  MRN:  [id number] Principal Problem: Schizoaffective disorder, bipolar type [facility]) Discharge Diagnoses: Principal Problem:   Schizoaffective disorder, bipolar type ([place])  Subjective: "I'm fine."  Denies suicidal/homicidal ideations, hallucinations, and substance abuse.  Well known to this ED for frequent admissions for similar issues.  Stable to discharge back to his group home.  Per TTS:  Anthony Luna is an 33 y.o. male who presents to the ER, from his group home, after he had an anxiety attack. He states, his heart was beating fast and it was difficult to breathe. Patient states, he was recently inpatient with [facility] and since his discharge he's been doing well. Patient asked writer, "Can I go home."  Per the Group Home [phone]), patient have history of panic attacks and had one today. He also reported he was having A/H telling him to harm his self. Patient was recently inpatient with [facility] and was discharged this past Tuesday (12/28/2018). "He's doing good until today."  During the interview, the patient was calm, cooperative and pleasant. He was able to provide appropriate answers to the questions. Patient is well known to the ER due to similar presentation. Patient does not have a history of self-harm or gestures. With this Probation officer, patient denies SI/HI and AV/H.  Total Time spent with patient: 45 minutes  Past Psychiatric History: schizoaffective disorder, bipolar type  Past Medical History:  Past Medical History:  Diagnosis Date  . Depression    Since moving into new apartment 1 year ago  . None to low serum cortisol response with adrenocorticotrophic hormone (ACTH) stimulation test   . Schizophrenia, paranoid type ([place])    History reviewed. No pertinent surgical history. Family History: No family history on file. Family Psychiatric  History: non Social History:  Social  History   Substance and Sexual Activity  Alcohol Use Yes  . Alcohol/week: 0.0 standard drinks   Comment: "Special occasions"     Social History   Substance and Sexual Activity  Drug Use Yes  . Frequency: 4.0 times per week  . Types: Marijuana   Comment: "2 blunts"    Social History   Socioeconomic History  . Marital status: Single    Spouse name: Not on file  . Number of children: Not on file  . Years of education: Not on file  . Highest education level: Not on file  Occupational History  . Not on file  Social Needs  . Financial resource strain: Not on file  . Food insecurity    Worry: Not on file    Inability: Not on file  . Transportation needs    Medical: Not on file    Non-medical: Not on file  Tobacco Use  . Smoking status: Current Every Day Smoker    Packs/day: 0.50    Types: Cigarettes    Start date: 11/29/2002  . Smokeless tobacco: Former Systems developer  . Tobacco comment: does not want to quit  Substance and Sexual Activity  . Alcohol use: Yes    Alcohol/week: 0.0 standard drinks    Comment: "Special occasions"  . Drug use: Yes    Frequency: 4.0 times per week    Types: Marijuana    Comment: "2 blunts"  . Sexual activity: Not on file  Lifestyle  . Physical activity    Days per week: Not on file    Minutes per session: Not on file  . Stress: Not on file  Relationships  . Social Musicianconnections    Talks on phone: Not on file    Gets together: Not on file    Attends religious service: Not on file    Active member of club or organization: Not on file    Attends meetings of clubs or organizations: Not on file    Relationship status: Not on file  Other Topics Concern  . Not on file  Social History Narrative  . Not on file    Has this patient used any form of tobacco in the last 30 days? (Cigarettes, Smokeless Tobacco, Cigars, and/or Pipes) NA  Current Medications: No current facility-administered medications for this encounter.    Current Outpatient  Medications  Medication Sig Dispense Refill  . buPROPion (WELLBUTRIN XL) 150 MG 24 hr tablet Take 1 tablet (150 mg total) by mouth daily. 30 tablet 1  . cloZAPine (CLOZARIL) 50 MG tablet Take 7 tablets (350 mg total) by mouth at bedtime. 210 tablet 1  . fluticasone (FLONASE) 50 MCG/ACT nasal spray Place 2 sprays into both nostrils daily.    . Melatonin 5 MG TABS Take 5 mg by mouth at bedtime.    . Paliperidone Palmitate (INVEGA TRINZA) 819 MG/2.625ML SUSP Inject 819 mg into the muscle.    . propranolol (INDERAL) 10 MG tablet Take 1 tablet (10 mg total) by mouth 3 (three) times daily. 90 tablet 0  . Vitamin D, Ergocalciferol, (DRISDOL) 1.25 MG (50000 UT) CAPS capsule Take 50,000 Units by mouth every 7 (seven) days.     PTA Medications: (Not in a hospital admission)   Musculoskeletal: Strength & Muscle Tone: within normal limits Gait & Station: normal Patient leans: N/A  Psychiatric Specialty Exam: Physical Exam  Nursing note and vitals reviewed. Constitutional: He is oriented to person, place, and time. He appears well-developed and well-nourished.  HENT:  Head: Normocephalic.  Neck: Normal range of motion.  Respiratory: Effort normal.  Musculoskeletal: Normal range of motion.  Neurological: He is alert and oriented to person, place, and time.  Psychiatric: His speech is normal and behavior is normal. Judgment and thought content normal. His mood appears anxious. His affect is blunt. Cognition and memory are normal.    Review of Systems  Psychiatric/Behavioral: The patient is nervous/anxious.   All other systems reviewed and are negative.   Blood pressure 117/82, pulse (!) 102, temperature 98.9 F (37.2 C), temperature source Oral, resp. rate 16, SpO2 100 %.There is no height or weight on file to calculate BMI.  General Appearance: Casual  Eye Contact:  Good  Speech:  Normal Rate  Volume:  Normal  Mood:  Anxious  Affect:  Blunt  Thought Process:  Coherent and Descriptions of  Associations: Intact  Orientation:  Full (Time, Place, and Person)  Thought Content:  WDL and Logical  Suicidal Thoughts:  No  Homicidal Thoughts:  No  Memory:  Immediate;   Good Recent;   Good Remote;   Good  Judgement:  Good  Insight:  Good  Psychomotor Activity:  Normal  Concentration:  Concentration: Good and Attention Span: Good  Recall:  Good  Fund of Knowledge:  Fair  Language:  Good  Akathisia:  No  Handed:  Right  AIMS (if indicated):     Assets:  Housing Leisure Time Physical Health Resilience Social Support  ADL's:  Intact  Cognition:  Impaired,  Mild  Sleep:        Demographic Factors:  Male  Loss Factors: NA  Historical Factors: NA  Risk  Reduction Factors:   Sense of responsibility to family, Living with another person, especially a relative, Positive social support and Positive therapeutic relationship  Continued Clinical Symptoms:  Anxiety, mild  Cognitive Features That Contribute To Risk:  None    Suicide Risk:  Minimal: No identifiable suicidal ideation.  Patients presenting with no risk factors but with morbid ruminations; may be classified as minimal risk based on the severity of the depressive symptoms   Plan Of Care/Follow-up recommendations:  Schizoaffective disorder, bipolar type: -Continued Clozaril 350 mg at bedtime -Continued Wellbutrin 150 mg daily -Continued Invega Sustenna monthly  Anxiety: -Continued propranolol 10 mg TID Activity:  as tolerated Diet:  heart healthy diet  Disposition: discharge to group home Anthony Luna , NP 01/03/2019, 2:14 PM

## 2019-01-03 NOTE — Discharge Instructions (Signed)
Managing Schizoaffective Disorder °If you have been diagnosed with schizoaffective disorder (ScAD), you may be relieved to know why you have felt or behaved a certain way. You may also feel overwhelmed about the treatment ahead, how to get the support you need, and how to deal with the condition day-to-day. With care and support, you can learn to manage your symptoms and live with ScAD. °ScAD is a chronic, lifelong condition that may occur in cycles. Periods of severe symptoms may be followed by periods of less severe symptoms or improvement. There are steps you can take to help manage ScAD and make your life better. °How to manage lifestyle changes °Managing stress °Stress is your body's reaction to life changes and events, both good and bad. For people with ScAD, stress can cause more severe symptoms to start (can be a trigger), so it is important to learn ways to deal with stress. Your health care provider, therapist, or counselor may suggest techniques such as: °· Meditation, muscle relaxation, and breathing exercises. °· Music therapy. This can include creating music or listening to music. °· Life skills training. This training is focused on work, self-care, money, house management, and social skills. °Other things you can do to manage stress include: °· Keeping a stress diary. This can help you learn what causes your stress to start and how you can control your response to those triggers. °· Exercising. Even a short daily walk can help. °· Getting enough sleep. °· Making a schedule to manage your time. Knowing what you will do from day to day helps you avoid feeling overwhelmed by tasks and deadlines. °· Spending time on hobbies you enjoy that help you relax. ° °Medicines °Your health care provider is likely to prescribe various types of medicine depending on your symptoms. These may include one or more of the following types: °· Antipsychotics. °· Mood stabilizers. °· Antidepressants. °Make sure you: °· Talk  with your pharmacist or health care provider about all medicines that you take, the possible side effects, and which medicines are safe to take together. °· Make it your goal to take part in all treatment decisions (shared decision-making). Ask about possible side effects of medicines that your health care provider recommends, and tell him or her how you feel about having those side effects. It is best if shared decision-making with your health care provider is part of your total treatment plan. °Relationships °Having the support of your family and friends can play a major role in the success of your treatment. The following steps can help you maintain healthy relationships: °· Think about going to couples therapy, family therapy, or family education classes. °· Create a written plan for your treatment, and include close family members and friends in the process. °· Consider bringing your partner or another family member or friend to the appointments you have with your health care provider. °How to recognize changes in your condition °If you find that your condition is getting worse, talk to your health care provider right away. Watch for these signs: °· Your mood becomes extreme with either emotional highs or the intense lows of depression. °· Your speech becomes unclear. °· You are disorganized, show the wrong social behaviors, or withdraw from social activities. °· You have racing thoughts and have trouble thinking clearly or staying focused. °· You hear, see, taste, and believe things that others do not. °· You have poor personal hygiene, weight gain or weight loss, or changes in how you are sleeping or eating. °  Follow these instructions at home: °· Take over-the-counter and prescription medicines only as told by your health care provider. Do not start new medicines or stop taking medicines before you ask your health care provider if it is safe to make those changes. °· Avoid caffeine, alcohol, and drugs. They  can affect how your medicine works and can make your symptoms worse. °· Eat a healthy diet. °· Look for support groups in your area so you can meet other people with your condition. You can learn new methods of managing ScAD by listening to others. °· Keep all follow-up visits as told by your health care provider, therapist, or counselor. This is important. °Where to find support °Talking to others °· Reach out to trusted friends or family members, explain your condition, and let them know that you are working with a health care team. °· Consider giving educational materials to friends and family. °· If you are having trouble telling your friends and family about your condition, keep in mind that honest and open communication can make these conversations easier. °Finances °Be sure to check with your insurance carrier to find out what treatment options are covered by your plan. You may also be able to find financial assistance through not-for-profit organizations or with local government-based resources. °If you are taking medicines, you may be able to get the generic form, which may be less expensive than brand-name medicine. Some makers of prescription medicines also offer help to patients who cannot afford the medicines that they need. °Therapy and support groups °· Make sure you find a counselor or therapist who is familiar with ScAD. Meet with your counselor or therapist once a week or more often if needed. °· Find support programs for people with ScAD. °Where to find more information °· National Alliance on Mental Illness: www.nami.org °Contact a health care provider if: °· You are not able to take your medicines as prescribed. °· Your symptoms get worse. °Get help right away if: °· You have serious thoughts about hurting yourself or others. °If you ever feel like you may hurt yourself or others, or have thoughts about taking your own life, get help right away. You can go to your nearest emergency department or  call: °· Your local emergency services (911 in the [place] °· A suicide crisis helpline, such as the National Suicide Prevention Lifeline at 1-800-273-8255. This is open 24 hours a day. °Summary °· Schizoaffective disorder (ScAD) is a chronic, lifelong illness. It is best controlled with continuous treatment that includes medicine and therapy. °· Learning ways to manage stress may help your treatment to work better. °· Having the support of your family and friends can be a key to making your treatment a success. °· If you find that your condition is getting worse, talk to your health care provider right away. °This information is not intended to replace advice given to you by your health care provider. Make sure you discuss any questions you have with your health care provider. °Document Released: 10/02/2016 Document Revised: 09/24/2018 Document Reviewed: 10/02/2016 °Elsevier Patient Education © 2020 [employer] ° °

## 2019-01-03 NOTE — ED Provider Notes (Signed)
[facility] Emergency Department Provider Note  Time seen: 11:49 AM  I have reviewed the triage vital signs and the nursing notes.   HISTORY  Chief Complaint Anxiety   HPI Anthony Luna is a 33 y.o. male with a past medical history of schizophrenia presents to the emergency department for anxiety.  According to the patient since yesterday he has been feeling very anxious which he describes as feeling uneasy and heart palpitations.  Patient states he was discharged from [facility] last week after an admission for depression.  Denies taking anything for anxiety at this time although record view states the patient is taking Wellbutrin.  Patient denies any current anxiety.  Denies any SI or HI.  No fever cough congestion or shortness of breath.   Past Medical History:  Diagnosis Date  . Depression    Since moving into new apartment 1 year ago  . None to low serum cortisol response with adrenocorticotrophic hormone (ACTH) stimulation test   . Schizophrenia, paranoid type [facility])     Patient Active Problem List   Diagnosis Date Noted  . Suicidal ideation 12/14/2018  . Cannabis use disorder, severe, dependence ([place]) 11/30/2014  . Schizoaffective disorder, bipolar type ([place]) 11/30/2014  . Tobacco use disorder 11/30/2014    History reviewed. No pertinent surgical history.  Prior to Admission medications   Medication Sig Start Date End Date Taking? Authorizing Provider  buPROPion (WELLBUTRIN XL) 150 MG 24 hr tablet Take 1 tablet (150 mg total) by mouth daily. 05/11/18   Pucilowska, Herma Ard B, MD  cloZAPine (CLOZARIL) 50 MG tablet Take 7 tablets (350 mg total) by mouth at bedtime. 05/10/18   Pucilowska, Jolanta B, MD  fluticasone (FLONASE) 50 MCG/ACT nasal spray Place 2 sprays into both nostrils daily.    [provider]  Melatonin 5 MG TABS Take 5 mg by mouth at bedtime.    [provider]  Paliperidone Palmitate (INVEGA TRINZA) 819  MG/2.625ML SUSP Inject 819 mg into the muscle.    [provider]  propranolol (INDERAL) 10 MG tablet Take 1 tablet (10 mg total) by mouth 3 (three) times daily. 12/02/17   [name], Tyson Babinski, MD  Vitamin D, Ergocalciferol, (DRISDOL) 1.25 MG (50000 UT) CAPS capsule Take 50,000 Units by mouth every 7 (seven) days.    [provider]    No Known Allergies  No family history on file.  Social History Social History   Tobacco Use  . Smoking status: Current Every Day Smoker    Packs/day: 0.50    Types: Cigarettes    Start date: 11/29/2002  . Smokeless tobacco: Former Systems developer  . Tobacco comment: does not want to quit  Substance Use Topics  . Alcohol use: Yes    Alcohol/week: 0.0 standard drinks    Comment: "Special occasions"  . Drug use: Yes    Frequency: 4.0 times per week    Types: Marijuana    Comment: "2 blunts"    Review of Systems Constitutional: Negative for fever. Eyes: Negative for visual complaints ENT: Negative for recent illness/congestion Cardiovascular: Negative for chest pain. Respiratory: Negative for shortness of breath. Gastrointestinal: Negative for abdominal pain, vomiting and diarrhea. Musculoskeletal: Negative for musculoskeletal complaints Neurological: Negative for headache All other ROS negative  ____________________________________________   PHYSICAL EXAM:  VITAL SIGNS: ED Triage Vitals  Enc Vitals Group     BP 01/03/19 1129 111/74     Pulse Rate 01/03/19 1129 (!) 121     Resp 01/03/19  1129 20     Temp 01/03/19 1129 98.9 F (37.2 C)     Temp Source 01/03/19 1129 Oral     SpO2 01/03/19 1129 96 %     Weight --      Height --      Head Circumference --      Peak Flow --      Pain Score 01/03/19 1131 0     Pain Loc --      Pain Edu? --      Excl. in GC? --    Constitutional: Alert and oriented. Well appearing and in no distress. Eyes: Normal exam ENT      Head: Normocephalic and atraumatic.      Nose: No  congestion/rhinnorhea.      Mouth/Throat: Mucous membranes are moist. Cardiovascular: Normal rate, regular rhythm. No murmurs, rubs, or gallops. Respiratory: Normal respiratory effort without tachypnea nor retractions. Breath sounds are clear Gastrointestinal: Soft and nontender. No distention.   Musculoskeletal: Nontender with normal range of motion in all extremities.  Neurologic:  Normal speech and language. No gross focal neurologic deficits Skin:  Skin is warm, dry and intact.  Psychiatric: Mood and affect are normal. Speech and behavior are normal.     INITIAL IMPRESSION / ASSESSMENT AND PLAN / ED COURSE  Pertinent labs & imaging results that were available during my care of the patient were reviewed by me and considered in my medical decision making (see chart for details).   Patient presents emergency department for anxiety.  Has been feeling very anxious yesterday and today although denies anxiety at this time.  Overall the patient appears well, asking for something to eat.  We will have psychiatry TTS evaluate the patient.  Denies any substance use.  Psychiatry has seen the patient.  Patient will be discharged home at this time.  Anthony Luna was evaluated in Emergency Department on 01/03/2019 for the symptoms described in the history of present illness. He was evaluated in the context of the global COVID-19 pandemic, which necessitated consideration that the patient might be at risk for infection with the SARS-CoV-2 virus that causes COVID-19. Institutional protocols and algorithms that pertain to the evaluation of patients at risk for COVID-19 are in a state of rapid change based on information released by regulatory bodies including the CDC and federal and state organizations. These policies and algorithms were followed during the patient's care in the ED.  ____________________________________________   FINAL CLINICAL IMPRESSION(S) / ED DIAGNOSES  Anxiety    Minna AntisPaduchowski, Keyondra Lagrand, MD 01/03/19 1453

## 2022-04-27 ENCOUNTER — Emergency Department (EMERGENCY_DEPARTMENT_HOSPITAL)
Admission: EM | Admit: 2022-04-27 | Discharge: 2022-04-28 | Disposition: A | Discharge status: Home / Self Care | Payer: No Typology Code available for payment source | Source: Home / Self Care | Attending: Emergency Medicine | Admitting: Emergency Medicine

## 2022-04-27 DIAGNOSIS — R451 Restlessness and agitation: Secondary | ICD-10-CM | POA: Insufficient documentation

## 2022-04-27 DIAGNOSIS — Z1152 Encounter for screening for COVID-19: Secondary | ICD-10-CM | POA: Insufficient documentation

## 2022-04-27 DIAGNOSIS — F449 Dissociative and conversion disorder, unspecified: Secondary | ICD-10-CM | POA: Insufficient documentation

## 2022-04-27 DIAGNOSIS — F200 Paranoid schizophrenia: Secondary | ICD-10-CM | POA: Insufficient documentation

## 2022-04-27 DIAGNOSIS — F250 Schizoaffective disorder, bipolar type: Secondary | ICD-10-CM | POA: Diagnosis present

## 2022-04-27 DIAGNOSIS — F23 Brief psychotic disorder: Secondary | ICD-10-CM

## 2022-04-27 LAB — COMPREHENSIVE METABOLIC PANEL
ALT: 32 U/L (ref 0–44)
AST: 36 U/L (ref 15–41)
Albumin: 4.5 g/dL (ref 3.5–5.0)
Alkaline Phosphatase: 57 U/L (ref 38–126)
Anion gap: 8 (ref 5–15)
BUN: 17 mg/dL (ref 6–20)
CO2: 26 mmol/L (ref 22–32)
Calcium: 9.7 mg/dL (ref 8.9–10.3)
Chloride: 107 mmol/L (ref 98–111)
Creatinine, Ser: 1.47 mg/dL — ABNORMAL HIGH (ref 0.61–1.24)
GFR, Estimated: >60 mL/min (ref >60)
Glucose, Bld: 94 mg/dL (ref 70–99)
Potassium: 3.9 mmol/L (ref 3.5–5.1)
Sodium: 141 mmol/L (ref 135–145)
Total Bilirubin: 1 mg/dL (ref 0.3–1.2)
Total Protein: 7.3 g/dL (ref 6.5–8.1)

## 2022-04-27 LAB — CBC
HCT: 43.8 % (ref 39.0–52.0)
Hemoglobin: 14.8 g/dL (ref 13.0–17.0)
MCH: 30.8 pg (ref 26.0–34.0)
MCHC: 33.8 g/dL (ref 30.0–36.0)
MCV: 91.3 fL (ref 80.0–100.0)
Platelets: 195 10*3/uL (ref 150–400)
RBC: 4.8 MIL/uL (ref 4.22–5.81)
RDW: 13.5 % (ref 11.5–15.5)
WBC: 9.4 10*3/uL (ref 4.0–10.5)
nRBC: 0 % (ref 0.0–0.2)

## 2022-04-27 LAB — ETHANOL: Alcohol, Ethyl (B): <10 mg/dL (ref <10)

## 2022-04-27 LAB — SALICYLATE LEVEL: Salicylate Lvl: <7 mg/dL — ABNORMAL LOW (ref 7.0–30.0)

## 2022-04-27 LAB — SARS CORONAVIRUS 2 BY RT PCR: SARS Coronavirus 2 by RT PCR: NEGATIVE

## 2022-04-27 LAB — ACETAMINOPHEN LEVEL: Acetaminophen (Tylenol), Serum: <10 ug/mL — ABNORMAL LOW (ref 10–30)

## 2022-04-27 NOTE — ED Triage Notes (Signed)
Pt has been on meds for awhile and pt provider is no longer caring for pt and he is attempting to get a new provider but they can see him til next week. Pt is hallucinating with auditory and visual hallucinations with rapid talking.

## 2022-04-27 NOTE — ED Notes (Signed)
pt recieved snack and drink 

## 2022-04-27 NOTE — ED Notes (Signed)
Covid swab sent to the lab at this time.  

## 2022-04-27 NOTE — ED Notes (Signed)
Pt dressed out into appropriate hospital attire with this tech and Rachael, RN in the rm. Pt belongings consist of: white tennis shoes, a blue t-shirt, black sweat pants, a grey long sleeve shirt, a black beaded necklace, one green rubber bracelet, one black rubber bracelet, two pairs of white socks. A black jacket, gray jeans, a white t-shirt, and a brown belt.pt very calm and cooperative while dressing out. Pt belongings placed into two pt belongings bags and labeled with pt name.

## 2022-04-27 NOTE — BH Assessment (Signed)
Comprehensive Clinical Assessment (CCA) Note  04/27/2022 Anthony Luna [id number]  Chief Complaint: Patient is a 36 year old male presenting to [facility] ED under IVC. Per triage note Pt has been on meds for awhile and pt provider is no longer caring for pt and he is attempting to get a new provider but they can see him til next week. Pt is hallucinating with auditory and visual hallucinations with rapid talking. During assessment patient appears alert and oriented x2, it is unclear if patient understands where he is, patient's affect is flat and patient appears paranoid. During assessment patient would only communicate with this writer by either a head nod of yes, a head nod of no, or a thumbs up. When asked if patient understands why he is in the ED patient would only look around and appears to be paranoid. When asked if patient is currently having AH he responds with a head nod of yes. When asked if the AH want him to hurt himself or others patient denies. Patient only reports that he hears his AH and that they are becoming more intense. When asked if patient lives alone he denies but does not respond with where he lives. Patient also reports that his medications are not helping with his hallucinations. Patient denies any drug or alcohol use. When asked if patient is experiencing SI he denies.  Per Psyc NP Lerry Liner patient is recommended for Inpatient Chief Complaint  Patient presents with   Hallucinations   Visit Diagnosis: Schizoaffective disorder, bipolar type    CCA Screening, Triage and Referral (STR)  Patient Reported Information How did you hear about [place]? Self  Referral name: No data recorded Referral phone number: No data recorded  Whom do you see for routine medical problems? No data recorded Practice/Facility Name: No data recorded Practice/Facility Phone Number: No data recorded Name of Contact: No data recorded Contact Number: No data recorded Contact Fax Number: No  data recorded Prescriber Name: No data recorded Prescriber Address (if known): No data recorded  What Is the Reason for Your Visit/Call Today? Pt has been on meds for awhile and pt provider is no longer caring for pt and he is attempting to get a new provider but they can see him til next week. Pt is hallucinating with auditory and visual hallucinations with rapid talking.  How Long Has This Been Causing You Problems? > than 6 months  What Do You Feel Would Help You the Most Today? Treatment for Depression or other mood problem   Have You Recently Been in Any Inpatient Treatment (Hospital/Detox/Crisis Center/28-Day Program)? No data recorded Name/Location of Program/Hospital:No data recorded How Long Were You There? No data recorded When Were You Discharged? No data recorded  Have You Ever Received Services From [facility] Before? No data recorded Who Do You See at [facility]? No data recorded  Have You Recently Had Any Thoughts About Hurting Yourself? No  Are You Planning to Commit Suicide/Harm Yourself At This time? No   Have you Recently Had Thoughts About Hurting Someone [name]? No  Explanation: No data recorded  Have You Used Any Alcohol or Drugs in the Past 24 Hours? No  How Long Ago Did You Use Drugs or Alcohol? No data recorded What Did You Use and How Much? No data recorded  Do You Currently Have a Therapist/Psychiatrist? No  Name of Therapist/Psychiatrist: No data recorded  Have You Been Recently Discharged From Any Office Practice or Programs? No  Explanation of Discharge From  Practice/Program: No data recorded    CCA Screening Triage Referral Assessment Type of Contact: Face-to-Face  Is this Initial or Reassessment? No data recorded Date Telepsych consult ordered in [facility]:  No data recorded Time Telepsych consult ordered in [facility]:  No data recorded  Patient Reported Information Reviewed? No data recorded Patient Left Without Being Seen? No data  recorded Reason for Not Completing Assessment: No data recorded  Collateral Involvement: No data recorded  Does Patient Have a Court Appointed Legal Guardian? No data recorded Name and Contact of Legal Guardian: No data recorded If Minor and Not Living with Parent(s), Who has Custody? No data recorded Is CPS involved or ever been involved? Never  Is APS involved or ever been involved? Never   Patient Determined To Be At Risk for Harm To Self or Others Based on Review of Patient Reported Information or Presenting Complaint? No data recorded Method: No data recorded Availability of Means: No data recorded Intent: No data recorded Notification Required: No data recorded Additional Information for Danger to Others Potential: No data recorded Additional Comments for Danger to Others Potential: No data recorded Are There Guns or Other Weapons in Your Home? No data recorded Types of Guns/Weapons: No data recorded Are These Weapons Safely Secured?                            No data recorded Who Could Verify You Are Able To Have These Secured: No data recorded Do You Have any Outstanding Charges, Pending Court Dates, Parole/Probation? No data recorded Contacted To Inform of Risk of Harm To Self or Others: No data recorded  Location of Assessment: [facility] ED   Does Patient Present under Involuntary Commitment? Yes  IVC Papers Initial File Date: No data recorded  [place] of Residence: [employer]   Patient Currently Receiving the Following Services: No data recorded  Determination of Need: Emergent (2 hours)   Options For Referral: No data recorded    CCA Biopsychosocial Intake/Chief Complaint:  No data recorded Current Symptoms/Problems: No data recorded  Patient Reported Schizophrenia/Schizoaffective Diagnosis in Past: Yes   Strengths: Patient is able to communicate  Preferences: No data recorded Abilities: No data recorded  Type of Services Patient Feels are Needed: No data  recorded  Initial Clinical Notes/Concerns: No data recorded  Mental Health Symptoms Depression:   Change in energy/activity; Difficulty Concentrating; Hopelessness   Duration of Depressive symptoms:  Greater than two weeks   Mania:   None   Anxiety:    Difficulty concentrating; Tension; Worrying   Psychosis:   Delusions; Hallucinations   Duration of Psychotic symptoms:  Greater than six months   Trauma:   None   Obsessions:   None   Compulsions:   None   Inattention:   None   Hyperactivity/Impulsivity:   None   Oppositional/Defiant Behaviors:   None   Emotional Irregularity:   None   Other Mood/Personality Symptoms:  No data recorded   Mental Status Exam Appearance and self-care  Stature:   Tall   Weight:   Average weight   Clothing:   Casual   Grooming:   Normal   Cosmetic use:   None   Posture/gait:   Normal   Motor activity:   Not Remarkable   Sensorium  Attention:   Distractible   Concentration:   Preoccupied   Orientation:   Person; Situation   Recall/memory:   Normal   Affect and Mood  Affect:  Anxious   Mood:   Anxious   Relating  Eye contact:   Staring   Facial expression:   Tense   Attitude toward examiner:   Guarded; Resistant; Suspicious   Thought and Language  Speech flow:  Mute   Thought content:   Suspicious   Preoccupation:   -- (UTA)   Hallucinations:   Auditory   Organization:  No data recorded  Affiliated Computer ServicesExecutive Functions  Fund of Knowledge:   Fair   Intelligence:   Average   Abstraction:   Functional   Judgement:   Fair   Dance movement psychotherapisteality Testing:   Adequate   Insight:   Fair   Decision Making:   Normal   Social Functioning  Social Maturity:   Responsible   Social Judgement:   Normal   Stress  Stressors:   -- Industrial/product designer(UTA)   Coping Ability:   Exhausted   Skill Deficits:   None   Supports:   -- Industrial/product designer(UTA)     Religion: Religion/Spirituality Are You A Religious Person?:   Industrial/product designer(UTA)  Leisure/Recreation: Leisure / Recreation Do You Have Hobbies?:  (UTA)  Exercise/Diet: Exercise/Diet Do You Exercise?:  (UTA) Have You Gained or Lost A Significant Amount of Weight in the Past Six Months?:  (UTA) Do You Follow a Special Diet?:  (UTA) Do You Have Any Trouble Sleeping?:  (UTA)   CCA Employment/Education Employment/Work Situation: Employment / Work Situation Employment Situation: On disability Why is Patient on Disability: Mental health How Long has Patient Been on Disability: Unknown Has Patient ever Been in the [employer]ancorpMilitary?: No  Education: Education Is Patient Currently Attending School?: No Did You Have An Individualized Education Program (IIEP): No Did You Have Any Difficulty At Progress EnergySchool?: No Patient's Education Has Been Impacted by Current Illness: No   CCA Family/Childhood History Family and Relationship History: Family history Marital status: Single Does patient have children?: No  Childhood History:  Childhood History By whom was/is the patient raised?: Both parents Did patient suffer any verbal/emotional/physical/sexual abuse as a child?:  (UTA) Did patient suffer from severe childhood neglect?:  (UTA) Has patient ever been sexually abused/assaulted/raped as an adolescent or adult?:  (UTA) Was the patient ever a victim of a crime or a disaster?:  (UTA) Witnessed domestic violence?:  (UTA) Has patient been affected by domestic violence as an adult?:  Industrial/product designer(UTA)  Child/Adolescent Assessment:     CCA Substance Use Alcohol/Drug Use: Alcohol / Drug Use Pain Medications: See PTA Prescriptions: See PTA Over the Counter: See PTA History of alcohol / drug use?: No history of alcohol / drug abuse                         ASAM's:  Six Dimensions of Multidimensional Assessment  Dimension 1:  Acute Intoxication and/or Withdrawal Potential:      Dimension 2:  Biomedical Conditions and Complications:      Dimension 3:  Emotional,  Behavioral, or Cognitive Conditions and Complications:     Dimension 4:  Readiness to Change:     Dimension 5:  Relapse, Continued use, or Continued Problem Potential:     Dimension 6:  Recovery/Living Environment:     ASAM Severity Score:    ASAM Recommended Level of Treatment:     Substance use Disorder (SUD)    Recommendations for Services/Supports/Treatments:    DSM5 Diagnoses: Patient Active Problem List   Diagnosis Date Noted   Suicidal ideation 12/14/2018   Cannabis use disorder, severe, dependence (HCC) 11/30/2014  Schizoaffective disorder, bipolar type (HCC) 11/30/2014   Tobacco use disorder 11/30/2014    Patient Centered Plan: Patient is on the following Treatment Plan(s):  Impulse Control   Referrals to Alternative Service(s): Referred to Alternative Service(s):   Place:   Date:   Time:    Referred to Alternative Service(s):   Place:   Date:   Time:    Referred to Alternative Service(s):   Place:   Date:   Time:    Referred to Alternative Service(s):   Place:   Date:   Time:      @BHCOLLABOFCARE @  , LCAS-A

## 2022-04-27 NOTE — ED Notes (Signed)
Pt was in the bathroom for several minutes and was unable to provide a urine sample. Pt informed that he could provide a sample when he had the urge to urinate.

## 2022-04-27 NOTE — ED Provider Notes (Signed)
[facility] Provider Note    Event Date/Time   First MD Initiated Contact with Patient 04/27/22 1954     (approximate)   History   Hallucinations   HPI  Anthony Luna is a 36 y.o. male with a history of schizophrenia who comes ED reporting that he has been off his medications for the past week and is having increased auditory hallucinations with agitation.  Denies SI or HI.     Physical Exam   Triage Vital Signs: ED Triage Vitals  Enc Vitals Group     BP 04/27/22 1849 121/81     Pulse Rate 04/27/22 1849 92     Resp 04/27/22 1849 18     Temp 04/27/22 1849 98.6 F (37 C)     Temp Source 04/27/22 1849 Oral     SpO2 04/27/22 1849 97 %     Weight 04/27/22 1901 195 lb (88.5 kg)     Height --      Head Circumference --      Peak Flow --      Pain Score 04/27/22 1901 0     Pain Loc --      Pain Edu? --      Excl. in [place]? --     Most recent vital signs: Vitals:   04/27/22 1849  BP: 121/81  Pulse: 92  Resp: 18  Temp: 98.6 F (37 C)  SpO2: 97%    General: Awake, no distress.  CV:  Good peripheral perfusion.  Resp:  Normal effort.  Abd:  No distention.  Other:  Disorganized thought process, frequently interrupted speech due to inability to express himself, seems to be responding to internal stimuli.  Interactive.   ED Results / Procedures / Treatments   Labs (all labs ordered are listed, but only abnormal results are displayed) Labs Reviewed  COMPREHENSIVE METABOLIC PANEL - Abnormal; Notable for the following components:      Result Value   Creatinine, Ser 1.47 (*)    All other components within normal limits  SALICYLATE LEVEL - Abnormal; Notable for the following components:   Salicylate Lvl [id number] (*)    All other components within normal limits  ACETAMINOPHEN LEVEL - Abnormal; Notable for the following components:   Acetaminophen (Tylenol), Serum <10 (*)    All other components within normal limits  SARS CORONAVIRUS 2 BY  RT PCR  RESP PANEL BY RT-PCR (FLU A&B, COVID) [id number]  ETHANOL  CBC  URINE DRUG SCREEN, QUALITATIVE ([facility] ONLY)     RADIOLOGY    PROCEDURES:  Procedures   MEDICATIONS ORDERED IN ED: Medications - No data to display   IMPRESSION / MDM / [place] / ED COURSE  I reviewed the triage vital signs and the nursing notes.                             Patient presents with acute psychosis symptoms in the setting of being off of his medication for schizophrenia.  Will IVC for safety, consult psychiatry.  He is medically stable.  The patient has been placed in psychiatric observation due to the need to provide a safe environment for the patient while obtaining psychiatric consultation and evaluation, as well as ongoing medical and medication management to treat the patient's condition.  The patient has been placed under full IVC at this time.       FINAL CLINICAL IMPRESSION(S) / ED DIAGNOSES  Final diagnoses:  Acute psychosis (HCC)     Rx / DC Orders   ED Discharge Orders     None        Note:  This document was prepared using Dragon voice recognition software and may include unintentional dictation errors.   Sharman Cheek, MD 04/27/22 2132

## 2022-04-28 ENCOUNTER — Inpatient Hospital Stay
Admission: RE | Admit: 2022-04-28 | Discharge: 2022-05-06 | DRG: 885 | Disposition: A | Discharge status: Home / Self Care | Payer: No Typology Code available for payment source | Source: Intra-hospital | Attending: Psychiatry | Admitting: Psychiatry

## 2022-04-28 ENCOUNTER — Encounter: Payer: Self-pay | Admitting: Psychiatric/Mental Health

## 2022-04-28 ENCOUNTER — Other Ambulatory Visit: Payer: Self-pay

## 2022-04-28 DIAGNOSIS — F209 Schizophrenia, unspecified: Principal | ICD-10-CM | POA: Diagnosis present

## 2022-04-28 DIAGNOSIS — Z9151 Personal history of suicidal behavior: Secondary | ICD-10-CM | POA: Diagnosis not present

## 2022-04-28 DIAGNOSIS — F200 Paranoid schizophrenia: Principal | ICD-10-CM | POA: Diagnosis present

## 2022-04-28 DIAGNOSIS — Z79899 Other long term (current) drug therapy: Secondary | ICD-10-CM | POA: Diagnosis not present

## 2022-04-28 DIAGNOSIS — F250 Schizoaffective disorder, bipolar type: Secondary | ICD-10-CM

## 2022-04-28 DIAGNOSIS — Z1152 Encounter for screening for COVID-19: Secondary | ICD-10-CM

## 2022-04-28 DIAGNOSIS — F203 Undifferentiated schizophrenia: Secondary | ICD-10-CM | POA: Diagnosis not present

## 2022-04-28 DIAGNOSIS — F17210 Nicotine dependence, cigarettes, uncomplicated: Secondary | ICD-10-CM | POA: Diagnosis present

## 2022-04-28 DIAGNOSIS — Z91148 Patient's other noncompliance with medication regimen for other reason: Secondary | ICD-10-CM

## 2022-04-28 LAB — CBC WITH DIFFERENTIAL/PLATELET
Abs Immature Granulocytes: 0.02 10*3/uL (ref 0.00–0.07)
Basophils Absolute: 0 10*3/uL (ref 0.0–0.1)
Basophils Relative: 0 %
Eosinophils Absolute: 0.2 10*3/uL (ref 0.0–0.5)
Eosinophils Relative: 2 %
HCT: 44.1 % (ref 39.0–52.0)
Hemoglobin: 14.8 g/dL (ref 13.0–17.0)
Immature Granulocytes: 0 %
Lymphocytes Relative: 25 %
Lymphs Abs: 2 10*3/uL (ref 0.7–4.0)
MCH: 30.6 pg (ref 26.0–34.0)
MCHC: 33.6 g/dL (ref 30.0–36.0)
MCV: 91.3 fL (ref 80.0–100.0)
Monocytes Absolute: 1 10*3/uL (ref 0.1–1.0)
Monocytes Relative: 12 %
Neutro Abs: 4.7 10*3/uL (ref 1.7–7.7)
Neutrophils Relative %: 61 %
Platelets: 187 10*3/uL (ref 150–400)
RBC: 4.83 MIL/uL (ref 4.22–5.81)
RDW: 13.5 % (ref 11.5–15.5)
WBC: 8 10*3/uL (ref 4.0–10.5)
nRBC: 0 % (ref 0.0–0.2)

## 2022-04-28 LAB — URINE DRUG SCREEN, QUALITATIVE (ARMC ONLY)
Amphetamines, Ur Screen: NOT DETECTED
Barbiturates, Ur Screen: NOT DETECTED
Benzodiazepine, Ur Scrn: NOT DETECTED
Cannabinoid 50 Ng, Ur ~~LOC~~: NOT DETECTED
Cocaine Metabolite,Ur ~~LOC~~: NOT DETECTED
MDMA (Ecstasy)Ur Screen: NOT DETECTED
Methadone Scn, Ur: NOT DETECTED
Opiate, Ur Screen: NOT DETECTED
Phencyclidine (PCP) Ur S: NOT DETECTED
Tricyclic, Ur Screen: NOT DETECTED

## 2022-04-28 MED ORDER — TRAZODONE HCL 50 MG PO TABS
50.0000 mg | ORAL_TABLET | Freq: Every evening | ORAL | Status: DC | PRN
  Administered 2022-04-28: 50 mg via ORAL
  Filled 2022-04-28: qty 1

## 2022-04-28 MED ORDER — DOCUSATE SODIUM 100 MG PO CAPS
100.0000 mg | ORAL_CAPSULE | Freq: Two times a day (BID) | ORAL | Status: DC
  Administered 2022-04-28: 100 mg via ORAL
  Filled 2022-04-28: qty 1

## 2022-04-28 MED ORDER — HYDROXYZINE HCL 25 MG PO TABS
25.0000 mg | ORAL_TABLET | Freq: Three times a day (TID) | ORAL | Status: DC | PRN
  Administered 2022-04-28 – 2022-05-05 (×5): 25 mg via ORAL
  Filled 2022-04-28 (×6): qty 1

## 2022-04-28 MED ORDER — MELATONIN 5 MG PO TABS
5.0000 mg | ORAL_TABLET | Freq: Every day | ORAL | Status: DC
  Administered 2022-04-28 – 2022-05-05 (×8): 5 mg via ORAL
  Filled 2022-04-28 (×8): qty 1

## 2022-04-28 MED ORDER — ALUM & MAG HYDROXIDE-SIMETH 200-200-20 MG/5ML PO SUSP: 30.0000 mL | ORAL | Status: DC | PRN

## 2022-04-28 MED ORDER — PROPRANOLOL HCL 10 MG PO TABS
20.0000 mg | ORAL_TABLET | Freq: Two times a day (BID) | ORAL | Status: DC
  Administered 2022-04-28: 20 mg via ORAL
  Filled 2022-04-28: qty 2

## 2022-04-28 MED ORDER — BENZTROPINE MESYLATE 1 MG PO TABS
1.0000 mg | ORAL_TABLET | Freq: Two times a day (BID) | ORAL | Status: DC
  Administered 2022-04-28: 1 mg via ORAL
  Filled 2022-04-28: qty 1

## 2022-04-28 MED ORDER — ACETAMINOPHEN 325 MG PO TABS: 650.0000 mg | ORAL_TABLET | Freq: Four times a day (QID) | ORAL | Status: DC | PRN

## 2022-04-28 MED ORDER — LORATADINE 10 MG PO TABS
10.0000 mg | ORAL_TABLET | Freq: Every day | ORAL | Status: DC
  Administered 2022-04-28: 10 mg via ORAL
  Filled 2022-04-28: qty 1

## 2022-04-28 MED ORDER — MAGNESIUM HYDROXIDE 400 MG/5ML PO SUSP
30.0000 mL | Freq: Every day | ORAL | Status: DC | PRN
  Administered 2022-05-06: 30 mL via ORAL
  Filled 2022-04-28: qty 30

## 2022-04-28 MED ORDER — BUPROPION HCL ER (XL) 150 MG PO TB24
150.0000 mg | ORAL_TABLET | Freq: Every day | ORAL | Status: DC
  Administered 2022-04-29 – 2022-05-06 (×8): 150 mg via ORAL
  Filled 2022-04-28 (×9): qty 1

## 2022-04-28 MED ORDER — BUPROPION HCL ER (XL) 150 MG PO TB24
150.0000 mg | ORAL_TABLET | Freq: Every day | ORAL | Status: DC
  Administered 2022-04-28: 150 mg via ORAL
  Filled 2022-04-28: qty 1

## 2022-04-28 MED ORDER — CLONAZEPAM 0.5 MG PO TABS: 0.5000 mg | ORAL_TABLET | Freq: Two times a day (BID) | ORAL | Status: DC | PRN

## 2022-04-28 MED ORDER — TRAZODONE HCL 50 MG PO TABS
50.0000 mg | ORAL_TABLET | Freq: Once | ORAL | Status: AC
  Administered 2022-04-28: 50 mg via ORAL
  Filled 2022-04-28: qty 1

## 2022-04-28 MED ORDER — MELATONIN 5 MG PO TABS: 5.0000 mg | ORAL_TABLET | Freq: Every day | ORAL | Status: DC

## 2022-04-28 MED ORDER — CLOZAPINE 25 MG PO TABS: 350.0000 mg | ORAL_TABLET | Freq: Every day | ORAL | Status: DC

## 2022-04-28 NOTE — ED Notes (Signed)
Patient is standing in the dayroom, He is cooperative, will start talking to himself at times, Nurse will continue to monitor.

## 2022-04-28 NOTE — Tx Team (Signed)
Initial Treatment Plan 04/28/2022 5:25 PM [name] EFE:[id number]    PATIENT STRESSORS: Medication change or noncompliance     PATIENT STRENGTHS: Motivation for treatment/growth    PATIENT IDENTIFIED PROBLEMS: medication compliance                      DISCHARGE CRITERIA:  Improved stabilization in mood, thinking, and/or behavior  PRELIMINARY DISCHARGE PLAN: Return to previous living arrangement  PATIENT/FAMILY INVOLVEMENT: This treatment plan has been presented to and reviewed with the patient, Anthony Luna, and/or family member.  The patient and family have been given the opportunity to ask questions and make suggestions.  Hyman Hopes, RN 04/28/2022, 5:25 PM

## 2022-04-28 NOTE — ED Notes (Signed)
Patient took all of his po medications without difficulty, He did not ask any questions and said " Thank You".

## 2022-04-28 NOTE — ED Notes (Signed)
Hospital meal provided, pt tolerated w/o complaints.  Waste discarded appropriately.  

## 2022-04-28 NOTE — Consult Note (Cosign Needed Addendum)
According to chart note of Anthony Luna from 11/12, patient was recommended for inpatient psychiatric hospitalization by R. Durwin Nora, NP.   Today, patient is not verbalizing words. Does not make good eye contact. Has not shown any aggressive behavior. Will turn his head sharply at times, appearing to be responding to internal stimuli. Started clapping at one point. Patient has been accepted to the inpatient psychiatric unit and will be admitted on second shift. Pharmacy is working on getting his PTA medications reconciled.   Writer ordered medications as reconciled by pharmacy. Per group home, next injection of Eyvonne Mechanic 819 mg/2.626ml is due 05/28/22  Vanetta Mulders, PMHNP

## 2022-04-28 NOTE — ED Notes (Signed)
Patient observed on camera standing in room talking to himself.

## 2022-04-28 NOTE — ED Provider Notes (Signed)
Emergency Medicine Observation Re-evaluation Note  Anthony Luna is a 36 y.o. male, seen on rounds today.  Pt initially presented to the ED for complaints of Hallucinations Currently, the patient is resting.   Physical Exam  BP 121/81 (BP Location: Left Arm)   Pulse 92   Temp 98.6 F (37 C) (Oral)   Resp 18   Wt 88.5 kg   SpO2 97%   BMI 26.45 kg/m  Physical Exam General: no acute distress Lungs: nml WOB Psych: no agitation  ED Course / MDM  EKG:   I have reviewed the labs performed to date as well as medications administered while in observation.  Recent changes in the last 24 hours include patient presented to the ED for hallucinations and agitation.  He has not yet been seen by psychiatry.  Seen by TTS only.  Plan  Current plan is for psych consult, IVC for now.    Georga Hacking, MD 04/28/22 [phone]

## 2022-04-28 NOTE — ED Notes (Signed)
Patient remains standing talking to himself.

## 2022-04-28 NOTE — ED Notes (Signed)
Patient to ED BHU 1.  Patient oriented to unit with rounding and cameras.  Patient verbalized understanding.

## 2022-04-28 NOTE — Progress Notes (Signed)
Patient presents with a bizarre affect, often sitting and staring at staff. Pt denies needing anything when asked. Pt observed interacting appropriately with staff and peers on the unit. Pt compliant with medication administration per MD orders. Pt given education, support, and encouragement to be active in his treatment plan. Pt being monitored Q 15 minutes for safety per unit protocol, remains safe on the unit.

## 2022-04-28 NOTE — ED Notes (Signed)
This tech collected pt blood, pt was cooperative and calm. Asked pt to collect UA, pt mentioned he could not urinate at this time. Pt back in room

## 2022-04-28 NOTE — ED Notes (Signed)
Pt given dinner tray and drink 

## 2022-04-28 NOTE — ED Notes (Signed)
Patient transferred via w/c to BMU, report was called prior to him being transferred/admitted to [facility]. Patient escorted by security and CNA.

## 2022-04-28 NOTE — Progress Notes (Signed)
Patient ID: Anthony Luna, male   DOB: 05/26/1986, 36 y.o.   MRN: [id number] Admission note: Patient is a 36 year old male, presents IVC per report of schizophrenia. Patient is alert and oriented to unit. Patients affect is preoccupied. Patient is cooperative during assessment questions but during moments where there were loud noises on the unit the patient would stop the conversation and blankly stare at the ground.  Patient states that he was brought to the hospital due to "being off my medication." Patient denies stressors related to admission at this time. Patient states that the goals he was while being on the unit are to get "my mind, focus on my health, strength, and spirit." Patient then begins to talk about how he "speak to [employer] in my room" and that he was "seeing the antichrist and devil knocking on my door." Patient currently denies SI/HI/AVH. When asked about AVH patient states "so-so" and that he sometimes experiences AVH. Unit policies explained and verbalized understanding. Q15 minute checks maintained and will continue to monitor.

## 2022-04-28 NOTE — ED Notes (Signed)
Patient yelled out.  In to check on patient and he states he is fine.  Questioned patient if he wanted something to help him sleep, pt declined.

## 2022-04-29 DIAGNOSIS — F203 Undifferentiated schizophrenia: Secondary | ICD-10-CM | POA: Diagnosis not present

## 2022-04-29 MED ORDER — PROPRANOLOL HCL 20 MG PO TABS
20.0000 mg | ORAL_TABLET | Freq: Two times a day (BID) | ORAL | Status: DC
  Administered 2022-04-29 – 2022-05-06 (×14): 20 mg via ORAL
  Filled 2022-04-29 (×14): qty 1

## 2022-04-29 MED ORDER — CLONAZEPAM 0.5 MG PO TABS
0.5000 mg | ORAL_TABLET | Freq: Two times a day (BID) | ORAL | Status: DC
  Administered 2022-04-29 – 2022-05-06 (×14): 0.5 mg via ORAL
  Filled 2022-04-29 (×14): qty 1

## 2022-04-29 MED ORDER — BENZTROPINE MESYLATE 1 MG PO TABS
1.0000 mg | ORAL_TABLET | Freq: Two times a day (BID) | ORAL | Status: DC
  Administered 2022-04-29 – 2022-05-06 (×14): 1 mg via ORAL
  Filled 2022-04-29 (×14): qty 1

## 2022-04-29 MED ORDER — LORATADINE 10 MG PO TABS
10.0000 mg | ORAL_TABLET | Freq: Every day | ORAL | Status: DC
  Administered 2022-04-29 – 2022-05-06 (×8): 10 mg via ORAL
  Filled 2022-04-29 (×8): qty 1

## 2022-04-29 MED ORDER — CLOZAPINE 100 MG PO TABS
100.0000 mg | ORAL_TABLET | Freq: Every day | ORAL | Status: DC
  Administered 2022-04-29: 100 mg via ORAL
  Filled 2022-04-29: qty 1

## 2022-04-29 MED ORDER — FLUTICASONE PROPIONATE 50 MCG/ACT NA SUSP: 2.0000 | Freq: Every day | NASAL | Status: DC | PRN

## 2022-04-29 MED ORDER — ALBUTEROL SULFATE HFA 108 (90 BASE) MCG/ACT IN AERS: 1.0000 | INHALATION_SPRAY | Freq: Four times a day (QID) | RESPIRATORY_TRACT | Status: DC | PRN

## 2022-04-29 MED ORDER — DOCUSATE SODIUM 100 MG PO CAPS
100.0000 mg | ORAL_CAPSULE | Freq: Two times a day (BID) | ORAL | Status: DC
  Administered 2022-04-29 – 2022-05-06 (×14): 100 mg via ORAL
  Filled 2022-04-29 (×14): qty 1

## 2022-04-29 NOTE — Progress Notes (Addendum)
Patient looked over his shoulder and asked the nurse that this writer is precepting to leave so that he could speak to me in person. When the other nurse walked out, patient stated "I don't trust these people. I be hearing voices". Patient was paranoid about taking his Claritin, asking what it's for and he wanted to see the medication to make sure that the medication was in fact what I said it was. Once patient was ok with seeing the package, he stated that he would take it. Patient tolerated medication well, without any issues.   04/29/22 1420  Thought Process  Coherency Circumstantial;Flight of ideas;Disorganized;Loose associations;Tangential  Content Preoccupation;Religiosity;Paranoia;Blaming others;Delusions  Delusions Paranoid;Religious  Perception Hallucinations  Hallucination Auditory  Judgment Impaired  Confusion Mild  Danger to Self  Current suicidal ideation? Denies  Danger to Others  Danger to Others None reported or observed

## 2022-04-29 NOTE — [facility] Suicide Risk Assessment (Signed)
[facility] Admission Suicide Risk Assessment   Nursing information obtained from:  Patient Demographic factors:  Male, Unemployed Current Mental Status:  NA Loss Factors:  NA Historical Factors:  NA Risk Reduction Factors:  Positive social support  Total Time spent with patient: 45 minutes Principal Problem: Schizophrenia (HCC) Diagnosis:  Principal Problem:   Schizophrenia (HCC)  Subjective Data: Patient seen and chart reviewed.  36 year old man with a long history of schizophrenia was brought to the emergency room because of worsening psychotic symptoms consequent to his having been off of his medicine for several days.  Patient was not reporting suicidal ideation but was showing bizarre behavior on admission.  I attempted to speak with him today and he declined to talk with me.  Appeared to be somewhat manic and psychotic.  No evidence that he is trying to harm himself or anyone else.  Continued Clinical Symptoms:  Alcohol Use Disorder Identification Test Final Score (AUDIT): 2 The "Alcohol Use Disorders Identification Test", Guidelines for Use in Primary Care, Second Edition.  World [employer] writer [facility]). Score between 0-7:  no or low risk or alcohol related problems. Score between 8-15:  moderate risk of alcohol related problems. Score between 16-19:  high risk of alcohol related problems. Score 20 or above:  warrants further diagnostic evaluation for alcohol dependence and treatment.   CLINICAL FACTORS:   Schizophrenia:   Paranoid or undifferentiated type   Musculoskeletal: Strength & Muscle Tone: within normal limits Gait & Station: normal Patient leans: N/A  Psychiatric Specialty Exam:  Presentation  General Appearance: No data recorded Eye Contact:No data recorded Speech:No data recorded Speech Volume:No data recorded Handedness:No data recorded  Mood and Affect  Mood:No data recorded Affect:No data recorded  Thought Process  Thought Processes:No data  recorded Descriptions of Associations:No data recorded Orientation:No data recorded Thought Content:No data recorded History of Schizophrenia/Schizoaffective disorder:Yes  Duration of Psychotic Symptoms:Greater than six months  Hallucinations:No data recorded Ideas of Reference:No data recorded Suicidal Thoughts:No data recorded Homicidal Thoughts:No data recorded  Sensorium  Memory:No data recorded Judgment:No data recorded Insight:No data recorded  Executive Functions  Concentration:No data recorded Attention Span:No data recorded Recall:No data recorded Fund of Knowledge:No data recorded Language:No data recorded  Psychomotor Activity  Psychomotor Activity:No data recorded  Assets  Assets:No data recorded  Sleep  Sleep:No data recorded   Physical Exam: Physical Exam Vitals and nursing note reviewed.  Constitutional:      Appearance: Normal appearance.  HENT:     Head: Normocephalic and atraumatic.     Mouth/Throat:     Pharynx: Oropharynx is clear.  Eyes:     Pupils: Pupils are equal, round, and reactive to light.  Cardiovascular:     Rate and Rhythm: Normal rate and regular rhythm.  Pulmonary:     Effort: Pulmonary effort is normal.     Breath sounds: Normal breath sounds.  Abdominal:     General: Abdomen is flat.     Palpations: Abdomen is soft.  Musculoskeletal:        General: Normal range of motion.  Skin:    General: Skin is warm and dry.  Neurological:     General: No focal deficit present.     Mental Status: He is alert. Mental status is at baseline.  Psychiatric:        Attention and Perception: He is inattentive.        Mood and Affect: Affect is labile and inappropriate.        Speech: He is noncommunicative.  Behavior: Behavior is agitated. Behavior is not aggressive.        Judgment: Judgment is inappropriate.    Review of Systems  Unable to perform ROS: Psychiatric disorder   Blood pressure 114/69, pulse 83, temperature  98.5 F (36.9 C), temperature source Oral, resp. rate 18, height 6' (1.829 m), weight 87.1 kg, SpO2 100 %. Body mass index is 26.04 kg/m.   COGNITIVE FEATURES THAT CONTRIBUTE TO RISK:  Closed-mindedness and Loss of executive function    SUICIDE RISK:   Minimal: No identifiable suicidal ideation.  Patients presenting with no risk factors but with morbid ruminations; may be classified as minimal risk based on the severity of the depressive symptoms  PLAN OF CARE: Patient has only 1 very distant suicide attempt that has ever been documented and even that 1 was questionable.  He did not come into the hospital because of any self-injuring behavior and did express to me from a distance that he was willing to be compliant with medicine.  Continue 15-minute checks.  Restart psychiatric medicine as appropriate.  Try to continue meeting with him and doing assessment daily.  I certify that inpatient services furnished can reasonably be expected to improve the patient's condition.   Mordecai Rasmussen, MD 04/29/2022, 1:24 PM

## 2022-04-29 NOTE — H&P (Signed)
Psychiatric Admission Assessment Adult  Patient Identification: Anthony Luna MRN:  [id number] Date of Evaluation:  04/29/2022 Chief Complaint:  Schizophrenia (HCC) [F20.9] Principal Diagnosis: Schizophrenia (HCC) Diagnosis:  Principal Problem:   Schizophrenia (HCC)  History of Present Illness: A 36 year old man known to the system with a history of chronic schizophrenia.  2 days ago he was brought to the emergency room with his group home because of decompensations in his mental state.  Reports that he had become more disorganized and bizarre in his behavior.  Notes from the emergency room document that he was uncooperative and making bizarre statements with odd behavior intermittently mute.  Collateral history from the manager of his group home is that patient had run out of his medicine most specifically the clonazepam and Clozapine because his previous psychiatrist seems to have gone out of business or disappeared somehow.  Patient went without medicine for about a week she says.  I attempted to interview him today and he refused.  I said hello to him and he quickly walked past me in the hall saying he did not have time for me and would not talk to me.  Seems somewhat irritated.  On the other hand he then turned around and from a distance made a few comments which were not necessarily hostile and told me that he was taking his medicine. Associated Signs/Symptoms: Depression Symptoms:   None specific Duration of Depression Symptoms: Greater than two weeks  (Hypo) Manic Symptoms:  Distractibility, Labiality of Mood, Anxiety Symptoms:   Patient not willing to discuss Psychotic Symptoms:  Paranoia, Appears to be a little paranoid and disorganized in his thinking now.  In the emergency room was showing stereotyped movements and behavior and bizarre interactions. PTSD Symptoms: Negative Total Time spent with patient: 45 minutes  Past Psychiatric History: Patient has a long history of  schizophrenia.  He used to be a frequent visitor to the hospital years ago but seems to have been more stable of late.  Last known hospitalization was to [facility] in the summer 2022.  Prior to that it had been about 3 years since we saw him.  Patient had been maintained on clozapine as well as long-acting Invega shot.  Apparently he is now on the long-acting shot that is administered every 3 months and is not due until early December.  Patient has in the past claimed to have 1 distant suicide attempt by "head banging" but other than that has not been known to actually be physically dangerous to himself or others.  Has community support follow-up.  Has done quite well and been stable on clozapine as primary treatment  Is the patient at risk to self? Yes.    Has the patient been a risk to self in the past 6 months? No.  Has the patient been a risk to self within the distant past? Yes.    Is the patient a risk to others? Yes.    Has the patient been a risk to others in the past 6 months? No.  Has the patient been a risk to others within the distant past? Yes.     [place] Scale:  Flowsheet Row Admission (Current) from 04/28/2022 in [facility] INPATIENT BEH[facility]AL MEDICINE ED from 04/27/2022 in [facility] EMERGENCY DEPARTMENT ED from 01/03/[facility] in [facility]  C-SSRS RISK CATEGORY No Risk No Risk Error: Q3, 4, or 5 should not be populated when Q2 is No  Prior Inpatient Therapy:   Prior Outpatient Therapy:    Alcohol Screening: Patient refused Alcohol Screening Tool: Yes 1. How often do you have a drink containing alcohol?: Monthly or less 2. How many drinks containing alcohol do you have on a typical day when you are drinking?: 1 or 2 3. How often do you have six or more drinks on one occasion?: Less than monthly AUDIT-C Score: 2 4. How often during the last year have you found that you were not able to stop drinking once you had  started?: Never 5. How often during the last year have you failed to do what was normally expected from you because of drinking?: Never 6. How often during the last year have you needed a first drink in the morning to get yourself going after a heavy drinking session?: Never 7. How often during the last year have you had a feeling of guilt of remorse after drinking?: Never 8. How often during the last year have you been unable to remember what happened the night before because you had been drinking?: Never 9. Have you or someone else been injured as a result of your drinking?: No 10. Has a relative or friend or a doctor or another health worker been concerned about your drinking or suggested you cut down?: No Alcohol Use Disorder Identification Test Final Score (AUDIT): 2 Substance Abuse History in the last 12 months:  No. Consequences of Substance Abuse: Negative Previous Psychotropic Medications: Yes  Psychological Evaluations: Yes  Past Medical History:  Past Medical History:  Diagnosis Date   Depression    Since moving into new apartment 1 year ago   None to low serum cortisol response with adrenocorticotrophic hormone (ACTH) stimulation test    Schizophrenia, paranoid type (HCC)    History reviewed. No pertinent surgical history. Family History: History reviewed. No pertinent family history. Family Psychiatric  History: No information currently available Tobacco Screening:   Social History:  Social History   Substance and Sexual Activity  Alcohol Use Yes   Alcohol/week: 0.0 standard drinks of alcohol   Comment: "Special occasions"     Social History   Substance and Sexual Activity  Drug Use Yes   Frequency: 4.0 times per week   Types: Marijuana   Comment: "2 blunts"    Additional Social History:                           Allergies:  No Known Allergies Lab Results:  Results for orders placed or performed during the hospital encounter of 04/27/22 (from the  past 48 hour(s))  Urine Drug Screen, Qualitative     Status: None   Collection Time: 04/27/22  7:03 PM  Result Value Ref Range   Tricyclic, Ur Screen NONE DETECTED NONE DETECTED   Amphetamines, Ur Screen NONE DETECTED NONE DETECTED   MDMA (Ecstasy)Ur Screen NONE DETECTED NONE DETECTED   Cocaine Metabolite,Ur [place] NONE DETECTED NONE DETECTED   Opiate, Ur Screen NONE DETECTED NONE DETECTED   Phencyclidine (PCP) Ur S NONE DETECTED NONE DETECTED   Cannabinoid 50 Ng, Ur [place] NONE DETECTED NONE DETECTED   Barbiturates, Ur Screen NONE DETECTED NONE DETECTED   Benzodiazepine, Ur Scrn NONE DETECTED NONE DETECTED   Methadone Scn, Ur NONE DETECTED NONE DETECTED    Comment: (NOTE) Tricyclics + metabolites, urine    Cutoff 1000 ng/mL Amphetamines + metabolites, urine  Cutoff 1000 ng/mL MDMA (Ecstasy), urine  Cutoff 500 ng/mL Cocaine Metabolite, urine          Cutoff 300 ng/mL Opiate + metabolites, urine        Cutoff 300 ng/mL Phencyclidine (PCP), urine         Cutoff 25 ng/mL Cannabinoid, urine                 Cutoff 50 ng/mL Barbiturates + metabolites, urine  Cutoff 200 ng/mL Benzodiazepine, urine              Cutoff 200 ng/mL Methadone, urine                   Cutoff 300 ng/mL  The urine drug screen provides only a preliminary, unconfirmed analytical test result and should not be used for non-medical purposes. Clinical consideration and professional judgment should be applied to any positive drug screen result due to possible interfering substances. A more specific alternate chemical method must be used in order to obtain a confirmed analytical result. Gas chromatography / mass spectrometry (GC/MS) is the preferred confirm atory method. Performed at [facility], [address] [place], [place] [address]   Comprehensive metabolic panel     Status: Abnormal   Collection Time: 04/27/22  7:05 PM  Result Value Ref Range   Sodium 141 135 - 145 mmol/L   Potassium 3.9 3.5 -  5.1 mmol/L   Chloride 107 98 - 111 mmol/L   CO2 26 22 - 32 mmol/L   Glucose, Bld 94 70 - 99 mg/dL    Comment: Glucose reference range applies only to samples taken after fasting for at least 8 hours.   BUN 17 6 - 20 mg/dL   Creatinine, Ser 1.91 (H) 0.61 - 1.24 mg/dL   Calcium 9.7 8.9 - 47.8 mg/dL   Total Protein 7.3 6.5 - 8.1 g/dL   Albumin 4.5 3.5 - 5.0 g/dL   AST 36 15 - 41 U/L   ALT 32 0 - 44 U/L   Alkaline Phosphatase 57 38 - 126 U/L   Total Bilirubin 1.0 0.3 - 1.2 mg/dL   GFR, Estimated >29 >56 mL/min    Comment: (NOTE) Calculated using the CKD-EPI Creatinine Equation (2021)    Anion gap 8 5 - 15    Comment: Performed at [facility], [address] [place], [place] [address]  Ethanol     Status: None   Collection Time: 04/27/22  7:05 PM  Result Value Ref Range   Alcohol, Ethyl (B) <10 <10 mg/dL    Comment: (NOTE) Lowest detectable limit for serum alcohol is 10 mg/dL.  For medical purposes only. Performed at [facility], [address] [place], [place] [address]   Salicylate level     Status: Abnormal   Collection Time: 04/27/22  7:05 PM  Result Value Ref Range   Salicylate Lvl <7.0 (L) 7.0 - 30.0 mg/dL    Comment: Performed at [facility], [address] [place], [place] [address]  Acetaminophen level     Status: Abnormal   Collection Time: 04/27/22  7:05 PM  Result Value Ref Range   Acetaminophen (Tylenol), Serum <10 (L) 10 - 30 ug/mL    Comment: (NOTE) Therapeutic concentrations vary significantly. A range of 10-30 ug/mL  may be an effective concentration for many patients. However, some  are best treated at concentrations outside of this range. Acetaminophen concentrations >150 ug/mL at 4 hours after ingestion  and >50 ug/mL at 12 hours after ingestion are often  associated with  toxic reactions.  Performed at [facility], [address] [place], [place] [address]   cbc     Status: None   Collection Time:  04/27/22  7:05 PM  Result Value Ref Range   WBC 9.4 4.0 - 10.5 K/uL   RBC 4.80 4.22 - 5.81 MIL/uL   Hemoglobin 14.8 13.0 - 17.0 g/dL   HCT 60.4 54.0 - 98.1 %   MCV 91.3 80.0 - 100.0 fL   MCH 30.8 26.0 - 34.0 pg   MCHC 33.8 30.0 - 36.0 g/dL   RDW 19.1 47.8 - 29.5 %   Platelets 195 150 - 400 K/uL   nRBC 0.0 0.0 - 0.2 %    Comment: Performed at [facility], [address], [place], [place] [address]  SARS Coronavirus 2 by RT PCR (hospital order, performed in [facility] hospital lab) *cepheid single result test* Anterior Nasal Swab     Status: None   Collection Time: 04/27/22  7:49 PM   Specimen: Anterior Nasal Swab  Result Value Ref Range   SARS Coronavirus 2 by RT PCR NEGATIVE NEGATIVE    Comment: (NOTE) SARS-CoV-2 target nucleic acids are NOT DETECTED.  The SARS-CoV-2 RNA is generally detectable in upper and lower respiratory specimens during the acute phase of infection. The lowest concentration of SARS-CoV-2 viral copies this assay can detect is 250 copies / mL. A negative result does not preclude SARS-CoV-2 infection and should not be used as the sole basis for treatment or other patient management decisions.  A negative result may occur with improper specimen collection / handling, submission of specimen other than nasopharyngeal swab, presence of viral mutation(s) within the areas targeted by this assay, and inadequate number of viral copies (<250 copies / mL). A negative result must be combined with clinical observations, patient history, and epidemiological information.  Fact Sheet for Patients:   RoadLapTop.co.za  Fact Sheet for Healthcare Providers: http://kim-miller.com/  This test is not yet approved or  cleared by the [place] FDA and has been authorized for detection and/or diagnosis of SARS-CoV-2 by FDA under an Emergency Use Authorization (EUA).  This EUA will remain in effect (meaning this test can  be used) for the duration of the COVID-19 declaration under Section 564(b)(1) of the Act, 21 U.S.C. section 360bbb-3(b)(1), unless the authorization is terminated or revoked sooner.  Performed at [facility], [address] [place], [place] [address]   CBC with Differential/Platelet     Status: None   Collection Time: 04/28/22  3:30 PM  Result Value Ref Range   WBC 8.0 4.0 - 10.5 K/uL   RBC 4.83 4.22 - 5.81 MIL/uL   Hemoglobin 14.8 13.0 - 17.0 g/dL   HCT 46.9 62.9 - 52.8 %   MCV 91.3 80.0 - 100.0 fL   MCH 30.6 26.0 - 34.0 pg   MCHC 33.6 30.0 - 36.0 g/dL   RDW 41.3 24.4 - 01.0 %   Platelets 187 150 - 400 K/uL   nRBC 0.0 0.0 - 0.2 %   Neutrophils Relative % 61 %   Neutro Abs 4.7 1.7 - 7.7 K/uL   Lymphocytes Relative 25 %   Lymphs Abs 2.0 0.7 - 4.0 K/uL   Monocytes Relative 12 %   Monocytes Absolute 1.0 0.1 - 1.0 K/uL   Eosinophils Relative 2 %   Eosinophils Absolute 0.2 0.0 - 0.5 K/uL   Basophils Relative 0 %   Basophils Absolute 0.0 0.0 - 0.1 K/uL  Immature Granulocytes 0 %   Abs Immature Granulocytes 0.02 0.00 - 0.07 K/uL    Comment: Performed at [facility], [address] [place], [place] [address]    Blood Alcohol level:  Lab Results  Component Value Date   [facility] <10 04/27/2022   ETH <10 12/13/2018    Metabolic Disorder Labs:  Lab Results  Component Value Date   HGBA1C 5.0 05/06/2018   MPG 96.8 05/06/2018   MPG 93.93 11/25/2017   Lab Results  Component Value Date   PROLACTIN 6.1 12/19/2014   PROLACTIN 1.3 (L) 11/30/2014   Lab Results  Component Value Date   CHOL 173 05/06/2018   TRIG 132 05/06/2018   HDL 39 (L) 05/06/2018   CHOLHDL 4.4 05/06/2018   VLDL 26 05/06/2018   LDLCALC 108 (H) 05/06/2018   LDLCALC 93 11/25/2017    Current Medications: Current Facility-Administered Medications  Medication Dose Route Frequency Provider Last Rate Last Admin   acetaminophen (TYLENOL) tablet 650 mg  650 mg Oral Q6H PRN Dixon, Rashaun M,  NP       albuterol (VENTOLIN HFA) 108 (90 Base) MCG/ACT inhaler 1-2 puff  1-2 puff Inhalation Q6H PRN Damian Buckles, [name], MD       alum & mag hydroxide-simeth (MAALOX/MYLANTA) 200-200-20 MG/5ML suspension 30 mL  30 mL Oral Q4H PRN Dixon, Rashaun M, NP       benztropine (COGENTIN) tablet 1 mg  1 mg Oral BID Miniya Miguez, [name], MD       buPROPion (WELLBUTRIN XL) 24 hr tablet 150 mg  150 mg Oral Daily Dixon, Rashaun M, NP   150 mg at 04/29/22 0900   clonazePAM (KLONOPIN) tablet 0.5 mg  0.5 mg Oral BID Camdin Hegner, [name], MD       cloZAPine (CLOZARIL) tablet 100 mg  100 mg Oral QHS Katee Wentland T, MD       docusate sodium (COLACE) capsule 100 mg  100 mg Oral BID Estefanny Moler, [name], MD       fluticasone (FLONASE) 50 MCG/ACT nasal spray 2 spray  2 spray Each Nare Daily PRN Niveah Boerner, [name], MD       hydrOXYzine (ATARAX) tablet 25 mg  25 mg Oral TID PRN Jearld Lesch, NP   25 mg at 04/28/22 2103   loratadine (CLARITIN) tablet 10 mg  10 mg Oral Daily Zakhai Meisinger T, MD       magnesium hydroxide (MILK OF MAGNESIA) suspension 30 mL  30 mL Oral Daily PRN Durwin Nora, Rashaun M, NP       melatonin tablet 5 mg  5 mg Oral QHS Dixon, Rashaun M, NP   5 mg at 04/28/22 2103   propranolol (INDERAL) tablet 20 mg  20 mg Oral BID Deakon Frix, [name], MD       PTA Medications: Medications Prior to Admission  Medication Sig Dispense Refill Last Dose   albuterol (VENTOLIN HFA) 108 (90 Base) MCG/ACT inhaler Inhale 1-2 puffs into the lungs every 4 (four) hours as needed for wheezing or shortness of breath.      benztropine (COGENTIN) 0.5 MG tablet Take 1 mg by mouth 2 (two) times daily.      buPROPion (WELLBUTRIN XL) 150 MG 24 hr tablet Take 1 tablet (150 mg total) by mouth daily. 30 tablet 1    cetirizine (ZYRTEC) 10 MG tablet Take 10 mg by mouth daily.      clonazePAM (KLONOPIN) 0.5 MG tablet Take 0.5 mg by mouth 2 (two) times daily as  needed for anxiety.      cloZAPine (CLOZARIL) 100 MG tablet Take 350 mg by mouth at bedtime.       docusate sodium (COLACE) 100 MG capsule Take 100 mg by mouth 2 (two) times daily.      fluticasone (FLONASE) 50 MCG/ACT nasal spray Place 2 sprays into both nostrils daily.      Melatonin 5 MG TABS Take 5 mg by mouth at bedtime.      Paliperidone Palmitate (INVEGA TRINZA) 819 MG/2.625ML SUSP Inject 819 mg into the muscle.      propranolol (INDERAL) 10 MG tablet Take 1 tablet (10 mg total) by mouth 3 (three) times daily. (Patient taking differently: Take 20 mg by mouth 2 (two) times daily.) 90 tablet 0    Vitamin D, Ergocalciferol, (DRISDOL) 1.25 MG (50000 UT) CAPS capsule Take 50,000 Units by mouth every 7 (seven) days. (Patient not taking: Reported on 04/28/2022)       Musculoskeletal: Strength & Muscle Tone: within normal limits Gait & Station: normal Patient leans: N/A            Psychiatric Specialty Exam:  Presentation  General Appearance: No data recorded Eye Contact:No data recorded Speech:No data recorded Speech Volume:No data recorded Handedness:No data recorded  Mood and Affect  Mood:No data recorded Affect:No data recorded  Thought Process  Thought Processes:No data recorded Duration of Psychotic Symptoms: Greater than six months  Past Diagnosis of Schizophrenia or Psychoactive disorder: Yes  Descriptions of Associations:No data recorded Orientation:No data recorded Thought Content:No data recorded Hallucinations:No data recorded Ideas of Reference:No data recorded Suicidal Thoughts:No data recorded Homicidal Thoughts:No data recorded  Sensorium  Memory:No data recorded Judgment:No data recorded Insight:No data recorded  Executive Functions  Concentration:No data recorded Attention Span:No data recorded Recall:No data recorded Fund of Knowledge:No data recorded Language:No data recorded  Psychomotor Activity  Psychomotor Activity:No data recorded  Assets  Assets:No data recorded  Sleep  Sleep:No data recorded   Physical  Exam: Physical Exam Vitals and nursing note reviewed.  Constitutional:      Appearance: Normal appearance.  HENT:     Head: Normocephalic and atraumatic.     Mouth/Throat:     Pharynx: Oropharynx is clear.  Eyes:     Pupils: Pupils are equal, round, and reactive to light.  Cardiovascular:     Rate and Rhythm: Normal rate and regular rhythm.  Pulmonary:     Effort: Pulmonary effort is normal.     Breath sounds: Normal breath sounds.  Abdominal:     General: Abdomen is flat.     Palpations: Abdomen is soft.  Musculoskeletal:        General: Normal range of motion.  Skin:    General: Skin is warm and dry.  Neurological:     General: No focal deficit present.     Mental Status: He is alert. Mental status is at baseline.  Psychiatric:        Attention and Perception: He is inattentive.        Mood and Affect: Affect is labile and inappropriate.        Speech: He is noncommunicative.        Behavior: Behavior is agitated. Behavior is not aggressive.        Judgment: Judgment is inappropriate.    Review of Systems  Unable to perform ROS: Psychiatric disorder   Blood pressure 114/69, pulse 83, temperature 98.5 F (36.9 C), temperature source Oral, resp. rate 18, height 6' (1.829 m), weight 87.1  kg, SpO2 100 %. Body mass index is 26.04 kg/m.  Treatment Plan Summary: Medication management and Plan I spoke with the manager of his group home to confirm his medication list.  He is on clozapine 350 mg at night but has not had it in about a week.  Also normally takes clonazepam 0.5 mg twice a day and is on long-acting Invega injection but that has not due for a couple weeks.  Wellbutrin 150 mg/day as chronic.  Other medicines are largely medical or symptomatic.  Because it is been a few days that he has been off the Clozapine and I will not start right back at the full dose but start at 100 mg tonight.  If he tolerates that we can probably titrate up fairly rapidly.  Restart Klonopin.   Basically restart all of his outpatient medicines.  Continue 15-minute checks.  Every day of course try and talk with him and form some rapport.  Observation Level/Precautions:  15 minute checks  Laboratory:  Chemistry Profile  Psychotherapy:    Medications:    Consultations:    Discharge Concerns:    Estimated LOS:  Other:     Physician Treatment Plan for Primary Diagnosis: Schizophrenia (HCC) Long Term Goal(s): Improvement in symptoms so as ready for discharge  Short Term Goals: Ability to verbalize feelings will improve and Ability to demonstrate self-control will improve  Physician Treatment Plan for Secondary Diagnosis: Principal Problem:   Schizophrenia (HCC)  Long Term Goal(s): Improvement in symptoms so as ready for discharge  Short Term Goals: Ability to identify and develop effective coping behaviors will improve, Ability to maintain clinical measurements within normal limits will improve, and Compliance with prescribed medications will improve  I certify that inpatient services furnished can reasonably be expected to improve the patient's condition.    Mordecai Rasmussen, MD 11/14/20231:26 PM

## 2022-04-29 NOTE — Progress Notes (Signed)
Recreation Therapy Notes   Date: 04/29/2022  Time: 10:15 am    Location: Craft room   Behavioral response: Appropriate, Hyperactive, Animated   Intervention Topic:  Coping Skills    Discussion/Intervention:  Group content on today was focused on coping skills. The group defined what coping skills are and when they normally use coping skills. Individuals described how they normally cope with thing and the coping skills they normally use. Patients expressed why it is important to cope with things and how not coping with things can affect you. The group participated in the intervention "My coping box" and made coping boxes while adding coping skills they could use in the future to the box. Clinical Observations/Feedback: Patient came to group and was focused on what peers and staff had to say about coping skills. Participant came to group and defined coping skills as occupying time. He identified his coping skills as meditation, music, and journaling. Individual was social with peers and staff while participating in the intervention.     Nashaun Hillmer LRT/CTRS         Corian Handley 04/29/2022 12:49 PM

## 2022-04-29 NOTE — Group Note (Signed)
[place] LCSW Group Therapy Note   Group Date: 04/29/2022 Start Time: 1300 End Time: 1400  Type of Therapy/Topic:  Group Therapy:  Feelings about Diagnosis  Participation Level:  Did Not Attend   Description of Group:    This group will allow patients to explore their thoughts and feelings about diagnoses they have received. Patients will be guided to explore their level of understanding and acceptance of these diagnoses. Facilitator will encourage patients to process their thoughts and feelings about the reactions of others to their diagnosis, and will guide patients in identifying ways to discuss their diagnosis with significant others in their lives. This group will be process-oriented, with patients participating in exploration of their own experiences as well as giving and receiving support and challenge from other group members.   Therapeutic Goals: 1. Patient will demonstrate understanding of diagnosis as evidence by identifying two or more symptoms of the disorder:  2. Patient will be able to express two feelings regarding the diagnosis 3. Patient will demonstrate ability to communicate their needs through discussion and/or role plays  Summary of Patient Progress: Patient declined to attend group, despite being encouraged to do so.  Therapeutic Modalities:   Cognitive Behavioral Therapy Brief Therapy Feelings Identification    Harden Mo, LCSW

## 2022-04-29 NOTE — Plan of Care (Signed)
D- Patient alert and oriented. Patient presents anxious, preoccupied, but pleasant mood on assessment reporting that he slept poor last night because "I stayed up all night thinking about [employer] and endorsed complaints of pain on his self-inventory, however, he denied any pain to this Clinical research associate. Patient has been extremely animated and denies depression, but endorsed hopelessness and anxiety on his self-inventory, stating to this writer that "I feel like I can't do the things I used to anymore".  When this writer asked patient to go into further detail, he stated that he was using the bathroom and "they told me to hold it and it started hurting and when I let go, blood came out". This Clinical research associate asked patient if he was referring to blood being in his urine, he stated yes and when asked if he was still having these issues, patient stated no. Although patient denies SI, HI, AVH, and pain, patient has been observed responding to internal stimuli. Patient then states to this writer "everything is alright". Per his self-inventory, patient's goal for today is "to be myself", in which he will "listen", in order to achieve his goal.  A- Scheduled medications administered to patient, per MD orders. Support and encouragement provided.  Routine safety checks conducted every 15 minutes.  Patient informed to notify staff with problems or concerns.  R- No adverse drug reactions noted. Patient contracts for safety at this time. Patient compliant with medications and treatment plan. Patient receptive, calm, and cooperative. Patient interacts well with others on the unit. Patient remains safe at this time.  Problem: Education: Goal: Knowledge of General Education information will improve Description: Including pain rating scale, medication(s)/side effects and non-pharmacologic comfort measures Outcome: Not Progressing   Problem: Health Behavior/Discharge Planning: Goal: Ability to manage health-related needs will  improve Outcome: Not Progressing   Problem: Clinical Measurements: Goal: Ability to maintain clinical measurements within normal limits will improve Outcome: Not Progressing Goal: Will remain free from infection Outcome: Not Progressing Goal: Diagnostic test results will improve Outcome: Not Progressing Goal: Respiratory complications will improve Outcome: Not Progressing Goal: Cardiovascular complication will be avoided Outcome: Not Progressing   Problem: Activity: Goal: Risk for activity intolerance will decrease Outcome: Not Progressing   Problem: Nutrition: Goal: Adequate nutrition will be maintained Outcome: Not Progressing   Problem: Coping: Goal: Level of anxiety will decrease Outcome: Not Progressing   Problem: Elimination: Goal: Will not experience complications related to bowel motility Outcome: Not Progressing Goal: Will not experience complications related to urinary retention Outcome: Not Progressing   Problem: Pain Managment: Goal: General experience of comfort will improve Outcome: Not Progressing   Problem: Safety: Goal: Ability to remain free from injury will improve Outcome: Not Progressing   Problem: Skin Integrity: Goal: Risk for impaired skin integrity will decrease Outcome: Not Progressing   Problem: Education: Goal: Knowledge of [place] General Education information/materials will improve Outcome: Not Progressing Goal: Emotional status will improve Outcome: Not Progressing Goal: Mental status will improve Outcome: Not Progressing Goal: Verbalization of understanding the information provided will improve Outcome: Not Progressing   Problem: Activity: Goal: Interest or engagement in activities will improve Outcome: Not Progressing Goal: Sleeping patterns will improve Outcome: Not Progressing   Problem: Coping: Goal: Ability to verbalize frustrations and anger appropriately will improve Outcome: Not Progressing Goal: Ability to  demonstrate self-control will improve Outcome: Not Progressing   Problem: Health Behavior/Discharge Planning: Goal: Identification of resources available to assist in meeting health care needs will improve Outcome: Not Progressing Goal: Compliance  with treatment plan for underlying cause of condition will improve Outcome: Not Progressing   Problem: Physical Regulation: Goal: Ability to maintain clinical measurements within normal limits will improve Outcome: Not Progressing   Problem: Safety: Goal: Periods of time without injury will increase Outcome: Not Progressing

## 2022-04-30 DIAGNOSIS — F203 Undifferentiated schizophrenia: Secondary | ICD-10-CM | POA: Diagnosis not present

## 2022-04-30 LAB — LIPID PANEL
Cholesterol: 172 mg/dL (ref 0–200)
HDL: 37 mg/dL — ABNORMAL LOW (ref >40)
LDL Cholesterol: 127 mg/dL — ABNORMAL HIGH (ref 0–99)
Total CHOL/HDL Ratio: 4.6 RATIO
Triglycerides: 39 mg/dL (ref <150)
VLDL: 8 mg/dL (ref 0–40)

## 2022-04-30 LAB — HEMOGLOBIN A1C
Hgb A1c MFr Bld: 5.1 % (ref 4.8–5.6)
Mean Plasma Glucose: 99.67 mg/dL

## 2022-04-30 MED ORDER — CLOZAPINE 25 MG PO TABS
150.0000 mg | ORAL_TABLET | Freq: Every day | ORAL | Status: DC
  Administered 2022-04-30: 150 mg via ORAL
  Filled 2022-04-30: qty 2

## 2022-04-30 NOTE — [facility] Group Notes (Signed)
[facility] Group Notes:  (Nursing/MHT/Case Management/Adjunct)  Date:  04/30/2022  Time:  9:02 PM  Type of Therapy:   Wrap up  Participation Level:  Active  Participation Quality:  Appropriate  Affect:  Appropriate  Cognitive:  Alert  Insight:  Good  Engagement in Group:  Engaged and said he was thankful to be alive and he want a job to work and get his on place.  Modes of Intervention:  Support  Summary of Progress/Problems:  Mayra Neer 04/30/2022, 9:02 PM

## 2022-04-30 NOTE — Progress Notes (Signed)
Patient presents with a bizarre affect. Pt denies needing anything when asked. Pt observed interacting appropriately with staff and peers on the unit. Pt compliant with medication administration per MD orders. Pt given education, support, and encouragement to be active in his treatment plan. Pt being monitored Q 15 minutes for safety per unit protocol, remains safe on the unit.  

## 2022-04-30 NOTE — Progress Notes (Signed)
Patient presents with a bizarre affect. Pt denies needing anything when asked. Pt observed interacting appropriately with staff and peers on the unit. Pt compliant with medication administration per MD orders. Pt given education, support, and encouragement to be active in his treatment plan. Pt being monitored Q 15 minutes for safety per unit protocol, remains safe on the unit.

## 2022-04-30 NOTE — Progress Notes (Signed)
Recreation Therapy Notes  Date: 04/30/2022  Time: 10:45 am    Location: Craft room   Behavioral response: Appropriate   Intervention Topic:  Self-esteem     Discussion/Intervention:  Group content today was focused on self-esteem. Patient defined self-esteem and where it comes form. The group described reasons self-esteem is important. Individuals stated things that impact self-esteem and positive ways to improve self-esteem. The group participated in the intervention "Collage of Me" where patients were able to create a collage of positive things that makes them who they are. Clinical Observations/Feedback: Patient came to group and was focused on what peers and staff had to say about self-esteem. He defined self-esteem as feeling confident and believing in yourself. Participant expressed that no one can accomplish their goals with low self-esteem. Patient identified coping skills,positive people and hot showers as ways he manages his self-esteem. Individual left group early due to being sleepy.   Love Milbourne LRT/CTRS         Revin Corker 04/30/2022 2:33 PM
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
## 2022-04-30 NOTE — BH IP Treatment Plan (Signed)
Interdisciplinary Treatment and Diagnostic Plan Update  04/30/2022 Time of Session: 9:30AM Anthony Luna MRN: [id number]  Principal Diagnosis: Schizophrenia [facility])  Secondary Diagnoses: Principal Problem:   Schizophrenia (Anthony Luna)   Current Medications:  Current Facility-Administered Medications  Medication Dose Route Frequency Provider Last Rate Last Admin   acetaminophen (TYLENOL) tablet 650 mg  650 mg Oral Q6H PRN Dixon, Rashaun M, NP       albuterol (VENTOLIN HFA) 108 (90 Base) MCG/ACT inhaler 1-2 puff  1-2 puff Inhalation Q6H PRN [name], [name], MD       alum & mag hydroxide-simeth (MAALOX/MYLANTA) 200-200-20 MG/5ML suspension 30 mL  30 mL Oral Q4H PRN Dixon, Rashaun M, NP       benztropine (COGENTIN) tablet 1 mg  1 mg Oral BID [name], [name], MD   1 mg at 04/30/22 0802   buPROPion (WELLBUTRIN XL) 24 hr tablet 150 mg  150 mg Oral Daily Dixon, Rashaun M, NP   150 mg at 04/30/22 0801   clonazePAM (KLONOPIN) tablet 0.5 mg  0.5 mg Oral BID [name], [name], MD   0.5 mg at 04/30/22 0802   cloZAPine (CLOZARIL) tablet 150 mg  150 mg Oral QHS [name], [name], MD       docusate sodium (COLACE) capsule 100 mg  100 mg Oral BID [name], [name], MD   100 mg at 04/30/22 0801   fluticasone (FLONASE) 50 MCG/ACT nasal spray 2 spray  2 spray Each Nare Daily PRN [name], [name], MD       hydrOXYzine (ATARAX) tablet 25 mg  25 mg Oral TID PRN Deloria [name], NP   25 mg at 04/29/22 2109   loratadine (CLARITIN) tablet 10 mg  10 mg Oral Daily [name], [name], MD   10 mg at 04/30/22 0802   magnesium hydroxide (MILK OF MAGNESIA) suspension 30 mL  30 mL Oral Daily PRN Deloria [name], NP       melatonin tablet 5 mg  5 mg Oral QHS Dixon, Rashaun M, NP   5 mg at 04/29/22 2109   propranolol (INDERAL) tablet 20 mg  20 mg Oral BID [name], [name], MD   20 mg at 04/30/22 0802   PTA Medications: Medications Prior to Admission  Medication Sig Dispense Refill Last Dose   albuterol (VENTOLIN HFA) 108 (90  Base) MCG/ACT inhaler Inhale 1-2 puffs into the lungs every 4 (four) hours as needed for wheezing or shortness of breath.      benztropine (COGENTIN) 0.5 MG tablet Take 1 mg by mouth 2 (two) times daily.      buPROPion (WELLBUTRIN XL) 150 MG 24 hr tablet Take 1 tablet (150 mg total) by mouth daily. 30 tablet 1    cetirizine (ZYRTEC) 10 MG tablet Take 10 mg by mouth daily.      clonazePAM (KLONOPIN) 0.5 MG tablet Take 0.5 mg by mouth 2 (two) times daily as needed for anxiety.      cloZAPine (CLOZARIL) 100 MG tablet Take 350 mg by mouth at bedtime.      docusate sodium (COLACE) 100 MG capsule Take 100 mg by mouth 2 (two) times daily.      fluticasone (FLONASE) 50 MCG/ACT nasal spray Place 2 sprays into both nostrils daily.      Melatonin 5 MG TABS Take 5 mg by mouth at bedtime.      Paliperidone Palmitate (INVEGA TRINZA) 819 MG/2.625ML SUSP Inject 819 mg into the muscle.      propranolol (INDERAL)  10 MG tablet Take 1 tablet (10 mg total) by mouth 3 (three) times daily. (Patient taking differently: Take 20 mg by mouth 2 (two) times daily.) 90 tablet 0    Vitamin D, Ergocalciferol, (DRISDOL) 1.25 MG (50000 UT) CAPS capsule Take 50,000 Units by mouth every 7 (seven) days. (Patient not taking: Reported on 04/28/2022)       Patient Stressors: Medication change or noncompliance    Patient Strengths: Motivation for treatment/growth   Treatment Modalities: Medication Management, Group therapy, Case management,  1 to 1 session with clinician, Psychoeducation, Recreational therapy.   Physician Treatment Plan for Primary Diagnosis: Schizophrenia ([place]) Long Term Goal(s): Improvement in symptoms so as ready for discharge   Short Term Goals: Ability to identify and develop effective coping behaviors will improve Ability to maintain clinical measurements within normal limits will improve Compliance with prescribed medications will improve Ability to verbalize feelings will improve Ability to demonstrate  self-control will improve  Medication Management: Evaluate patient's response, side effects, and tolerance of medication regimen.  Therapeutic Interventions: 1 to 1 sessions, Unit Group sessions and Medication administration.  Evaluation of Outcomes: Not Met  Physician Treatment Plan for Secondary Diagnosis: Principal Problem:   Schizophrenia ([place])  Long Term Goal(s): Improvement in symptoms so as ready for discharge   Short Term Goals: Ability to identify and develop effective coping behaviors will improve Ability to maintain clinical measurements within normal limits will improve Compliance with prescribed medications will improve Ability to verbalize feelings will improve Ability to demonstrate self-control will improve     Medication Management: Evaluate patient's response, side effects, and tolerance of medication regimen.  Therapeutic Interventions: 1 to 1 sessions, Unit Group sessions and Medication administration.  Evaluation of Outcomes: Not Met   RN Treatment Plan for Primary Diagnosis: Schizophrenia ([place]) Long Term Goal(s): Knowledge of disease and therapeutic regimen to maintain health will improve  Short Term Goals: Ability to demonstrate self-control, Ability to participate in decision making will improve, Ability to verbalize feelings will improve, Ability to disclose and discuss suicidal ideas, Ability to identify and develop effective coping behaviors will improve, and Compliance with prescribed medications will improve  Medication Management: RN will administer medications as ordered by provider, will assess and evaluate patient's response and provide education to patient for prescribed medication. RN will report any adverse and/or side effects to prescribing provider.  Therapeutic Interventions: 1 on 1 counseling sessions, Psychoeducation, Medication administration, Evaluate responses to treatment, Monitor vital signs and CBGs as ordered, Perform/monitor CIWA,  COWS, AIMS and Fall Risk screenings as ordered, Perform wound care treatments as ordered.  Evaluation of Outcomes: Not Met   LCSW Treatment Plan for Primary Diagnosis: Schizophrenia ([place]) Long Term Goal(s): Safe transition to appropriate next level of care at discharge, Engage patient in therapeutic group addressing interpersonal concerns.  Short Term Goals: Engage patient in aftercare planning with referrals and resources, Increase social support, Increase ability to appropriately verbalize feelings, Increase emotional regulation, Facilitate acceptance of mental health diagnosis and concerns, and Increase skills for wellness and recovery  Therapeutic Interventions: Assess for all discharge needs, 1 to 1 time with Social worker, Explore available resources and support systems, Assess for adequacy in community support network, Educate family and significant other(s) on suicide prevention, Complete Psychosocial Assessment, Interpersonal group therapy.  Evaluation of Outcomes: Not Met   Progress in Treatment: Attending groups: No. Participating in groups: No. Taking medication as prescribed: Yes. Toleration medication: Yes. Family/Significant other contact made: No, will contact:  once permission is given.  Patient understands diagnosis: Yes. Discussing patient identified problems/goals with staff: Yes. Medical problems stabilized or resolved: Yes. Denies suicidal/homicidal ideation: Yes. Issues/concerns per patient self-inventory: No. Other: none  New problem(s) identified: No, Describe:  none  New Short Term/Long Term Goal(s): detox, elimination of symptoms of psychosis, medication management for mood stabilization; elimination of SI thoughts; development of comprehensive mental wellness/sobriety plan.   Patient Goals:  "getting on and staying on the right medication"  Discharge Plan or Barriers: CSW to assist in the development of appropriate discharge plans. Patient reports plans to  return to his group home.   Reason for Continuation of Hospitalization: Anxiety Depression Medication stabilization Suicidal ideation  Estimated Length of Stay:  1-7 days  Last 3 [place] Suicide Severity Risk Score: Flowsheet Row Admission (Current) from 04/28/2022 in [place] ED from 04/27/2022 in [place] from 01/03/2019 in [facility] No Risk No Risk Error: Q3, 4, or 5 should not be populated when Q2 is No       Last PHQ 2/9 Scores:     No data to display          Scribe for Treatment Team: Rozann Lesches, LCSW 04/30/2022 3:03 PM

## 2022-04-30 NOTE — Plan of Care (Addendum)
D- Patient alert and oriented. Patient continues to present in an anxious, preoccupied, but very pleasant mood on assessment reporting that he slept good last night and had no complaints to voice to this Clinical research associate. Patient denied anxiety, but endorsed hopelessness and depression on his self-inventory, however, when this writer asked patient about this, he denied it. Patient stated that overall he is feeling "grateful". Patient also endorsed AVH, stating that he hears voices and "I'm talking to the voices. I think it was spirits. I seen a couple of ghosts in my room though. My dad French Ana passed away, but I talk to him all the time. He just wants me to be myself, he says I'm in good hands, like [employer]". Patient denied SI/HI and pain at this time. Per his self-inventory, patient's goal for today is "my God", in which he will "pray", in order to achieve his goal.  A- Scheduled medications administered to patient, per MD orders. Support and encouragement provided.  Routine safety checks conducted every 15 minutes.  Patient informed to notify staff with problems or concerns.  R- No adverse drug reactions noted. Patient contracts for safety at this time. Patient compliant with medications and treatment plan. Patient receptive, calm, and cooperative. Patient interacts well with others on the unit. Patient remains safe at this time.  Problem: Education: Goal: Knowledge of General Education information will improve Description: Including pain rating scale, medication(s)/side effects and non-pharmacologic comfort measures Outcome: Progressing   Problem: Health Behavior/Discharge Planning: Goal: Ability to manage health-related needs will improve Outcome: Progressing   Problem: Clinical Measurements: Goal: Ability to maintain clinical measurements within normal limits will improve Outcome: Progressing Goal: Will remain free from infection Outcome: Progressing Goal: Diagnostic test results will  improve Outcome: Progressing Goal: Respiratory complications will improve Outcome: Progressing Goal: Cardiovascular complication will be avoided Outcome: Progressing   Problem: Activity: Goal: Risk for activity intolerance will decrease Outcome: Progressing   Problem: Nutrition: Goal: Adequate nutrition will be maintained Outcome: Progressing   Problem: Coping: Goal: Level of anxiety will decrease Outcome: Progressing   Problem: Elimination: Goal: Will not experience complications related to bowel motility Outcome: Progressing Goal: Will not experience complications related to urinary retention Outcome: Progressing   Problem: Pain Managment: Goal: General experience of comfort will improve Outcome: Progressing   Problem: Safety: Goal: Ability to remain free from injury will improve Outcome: Progressing   Problem: Skin Integrity: Goal: Risk for impaired skin integrity will decrease Outcome: Progressing   Problem: Education: Goal: Knowledge of [facility] Education information/materials will improve Outcome: Progressing Goal: Emotional status will improve Outcome: Progressing Goal: Mental status will improve Outcome: Progressing Goal: Verbalization of understanding the information provided will improve Outcome: Progressing   Problem: Activity: Goal: Interest or engagement in activities will improve Outcome: Progressing Goal: Sleeping patterns will improve Outcome: Progressing   Problem: Coping: Goal: Ability to verbalize frustrations and anger appropriately will improve Outcome: Progressing Goal: Ability to demonstrate self-control will improve Outcome: Progressing   Problem: Health Behavior/Discharge Planning: Goal: Identification of resources available to assist in meeting health care needs will improve Outcome: Progressing Goal: Compliance with treatment plan for underlying cause of condition will improve Outcome: Progressing   Problem:  Physical Regulation: Goal: Ability to maintain clinical measurements within normal limits will improve Outcome: Progressing   Problem: Safety: Goal: Periods of time without injury will increase Outcome: Progressing

## 2022-04-30 NOTE — Progress Notes (Signed)
[facility] MD Progress Note  04/30/2022 1:51 PM Anthony Luna  MRN:  [id number] Subjective: Follow-up for 36 year old man with schizophrenia.  Patient seen today.  He attended treatment team and was much more pleasant and cooperative than he was yesterday.  He is still hyperverbal but is more euphoric today.  He has insight into the fact that he needs to be back on appropriate medication and had no complaints about taking his medicine.  He was able to discuss his life at home and outpatient treatment appropriately. Principal Problem: Schizophrenia ([place]) Diagnosis: Principal Problem:   Schizophrenia ([place])  Total Time spent with patient: 30 minutes  Past Psychiatric History: Past history of longstanding schizophrenia that had responded to medication including clozapine and other antipsychotics and mood stabilizers  Past Medical History:  Past Medical History:  Diagnosis Date   Depression    Since moving into new apartment 1 year ago   None to low serum cortisol response with adrenocorticotrophic hormone (ACTH) stimulation test    Schizophrenia, paranoid type ([place])    History reviewed. No pertinent surgical history. Family History: History reviewed. No pertinent family history. Family Psychiatric  History: See previous no information available Social History:  Social History   Substance and Sexual Activity  Alcohol Use Yes   Alcohol/week: 0.0 standard drinks of alcohol   Comment: "Special occasions"     Social History   Substance and Sexual Activity  Drug Use Yes   Frequency: 4.0 times per week   Types: Marijuana   Comment: "2 blunts"    Social History   Socioeconomic History   Marital status: Single    Spouse name: Not on file   Number of children: Not on file   Years of education: Not on file   Highest education level: Not on file  Occupational History   Not on file  Tobacco Use   Smoking status: Every Day    Packs/day: 0.50    Types: Cigarettes    Start date:  11/29/2002   Smokeless tobacco: Former   Tobacco comments:    does not want to quit  Vaping Use   Vaping Use: Never used  Substance and Sexual Activity   Alcohol use: Yes    Alcohol/week: 0.0 standard drinks of alcohol    Comment: "Special occasions"   Drug use: Yes    Frequency: 4.0 times per week    Types: Marijuana    Comment: "2 blunts"   Sexual activity: Not on file  Other Topics Concern   Not on file  Social History Narrative   Not on file   Social Determinants of Health   Financial Resource Strain: Not on file  Food Insecurity: No Food Insecurity (04/28/2022)   Hunger Vital Sign    Worried About Running Out of Food in the Last Year: Never true    Ran Out of Food in the Last Year: Never true  Transportation Needs: No Transportation Needs (04/28/2022)   PRAPARE - Hydrologist (Medical): No    Lack of Transportation (Non-Medical): No  Physical Activity: Not on file  Stress: Not on file  Social Connections: Not on file   Additional Social History:                         Sleep: Fair  Appetite:  Fair  Current Medications: Current Facility-Administered Medications  Medication Dose Route Frequency Provider Last Rate Last Admin   acetaminophen (TYLENOL) tablet 650 mg  650 mg Oral Q6H PRN Dixon, Rashaun M, NP       albuterol (VENTOLIN HFA) 108 (90 Base) MCG/ACT inhaler 1-2 puff  1-2 puff Inhalation Q6H PRN Lynnea Vandervoort, [name], MD       alum & mag hydroxide-simeth (MAALOX/MYLANTA) 200-200-20 MG/5ML suspension 30 mL  30 mL Oral Q4H PRN Durwin Nora, Rashaun M, NP       benztropine (COGENTIN) tablet 1 mg  1 mg Oral BID Paolina Karwowski, [name], MD   1 mg at 04/30/22 0802   buPROPion (WELLBUTRIN XL) 24 hr tablet 150 mg  150 mg Oral Daily Durwin Nora, Rashaun M, NP   150 mg at 04/30/22 0801   clonazePAM (KLONOPIN) tablet 0.5 mg  0.5 mg Oral BID Stylianos Stradling, [name], MD   0.5 mg at 04/30/22 0802   cloZAPine (CLOZARIL) tablet 150 mg  150 mg Oral QHS Jo Booze, [name], MD        docusate sodium (COLACE) capsule 100 mg  100 mg Oral BID Arlee Santosuosso T, MD   100 mg at 04/30/22 0801   fluticasone (FLONASE) 50 MCG/ACT nasal spray 2 spray  2 spray Each Nare Daily PRN Tuwanda Vokes, [name], MD       hydrOXYzine (ATARAX) tablet 25 mg  25 mg Oral TID PRN Jearld Lesch, NP   25 mg at 04/29/22 2109   loratadine (CLARITIN) tablet 10 mg  10 mg Oral Daily Shanel Prazak, [name], MD   10 mg at 04/30/22 0802   magnesium hydroxide (MILK OF MAGNESIA) suspension 30 mL  30 mL Oral Daily PRN Jearld Lesch, NP       melatonin tablet 5 mg  5 mg Oral QHS Dixon, Rashaun M, NP   5 mg at 04/29/22 2109   propranolol (INDERAL) tablet 20 mg  20 mg Oral BID Belton Peplinski, [name], MD   20 mg at 04/30/22 2423    Lab Results:  Results for orders placed or performed during the hospital encounter of 04/28/22 (from the past 48 hour(s))  Lipid panel     Status: Abnormal   Collection Time: 04/30/22  6:37 AM  Result Value Ref Range   Cholesterol 172 0 - 200 mg/dL   Triglycerides 39 <536 mg/dL   HDL 37 (L) >14 mg/dL   Total CHOL/HDL Ratio 4.6 RATIO   VLDL 8 0 - 40 mg/dL   LDL Cholesterol 431 (H) 0 - 99 mg/dL    Comment:        Total Cholesterol/HDL:CHD Risk Coronary Heart Disease Risk Table                     Men   Women  1/2 Average Risk   3.4   3.3  Average Risk       5.0   4.4  2 X Average Risk   9.6   7.1  3 X Average Risk  23.4   11.0        Use the calculated Patient Ratio above and the CHD Risk Table to determine the patient's CHD Risk.        ATP III CLASSIFICATION (LDL):  <100     mg/dL   Optimal  540-086  mg/dL   Near or Above                    Optimal  130-159  mg/dL   Borderline  761-950  mg/dL   High  >932     mg/dL   Very High  Performed at [facility], [address] [place], [place] [address]   Hemoglobin A1c     Status: None   Collection Time: 04/30/22  6:37 AM  Result Value Ref Range   Hgb A1c MFr Bld 5.1 4.8 - 5.6 %    Comment: (NOTE) Pre diabetes:           5.7%-6.4%  Diabetes:              >6.4%  Glycemic control for   <7.0% adults with diabetes    Mean Plasma Glucose 99.67 mg/dL    Comment: Performed at [facility] [address], [place], [place] [address]    Blood Alcohol level:  Lab Results  Component Value Date   [facility] <10 04/27/2022   ETH <10 99991111    Metabolic Disorder Labs: Lab Results  Component Value Date   HGBA1C 5.1 04/30/2022   MPG 99.67 04/30/2022   MPG 96.8 05/06/2018   Lab Results  Component Value Date   PROLACTIN 6.1 12/19/2014   PROLACTIN 1.3 (L) 11/30/2014   Lab Results  Component Value Date   CHOL 172 04/30/2022   TRIG 39 04/30/2022   HDL 37 (L) 04/30/2022   CHOLHDL 4.6 04/30/2022   VLDL 8 04/30/2022   LDLCALC 127 (H) 04/30/2022   LDLCALC 108 (H) 05/06/2018    Physical Findings: AIMS:  , ,  ,  ,    CIWA:    COWS:     Musculoskeletal: Strength & Muscle Tone: within normal limits Gait & Station: normal Patient leans: N/A  Psychiatric Specialty Exam:  Presentation  General Appearance: No data recorded Eye Contact:No data recorded Speech:No data recorded Speech Volume:No data recorded Handedness:No data recorded  Mood and Affect  Mood:No data recorded Affect:No data recorded  Thought Process  Thought Processes:No data recorded Descriptions of Associations:No data recorded Orientation:No data recorded Thought Content:No data recorded History of Schizophrenia/Schizoaffective disorder:Yes  Duration of Psychotic Symptoms:Greater than six months  Hallucinations:No data recorded Ideas of Reference:No data recorded Suicidal Thoughts:No data recorded Homicidal Thoughts:No data recorded  Sensorium  Memory:No data recorded Judgment:No data recorded Insight:No data recorded  Executive Functions  Concentration:No data recorded Attention Span:No data recorded Recall:No data recorded Fund of Knowledge:No data recorded Language:No data recorded  Psychomotor Activity   Psychomotor Activity:No data recorded  Assets  Assets:No data recorded  Sleep  Sleep:No data recorded   Physical Exam: Physical Exam Vitals and nursing note reviewed.  Constitutional:      Appearance: Normal appearance.  HENT:     Head: Normocephalic and atraumatic.     Mouth/Throat:     Pharynx: Oropharynx is clear.  Eyes:     Pupils: Pupils are equal, round, and reactive to light.  Cardiovascular:     Rate and Rhythm: Normal rate and regular rhythm.  Pulmonary:     Effort: Pulmonary effort is normal.     Breath sounds: Normal breath sounds.  Abdominal:     General: Abdomen is flat.     Palpations: Abdomen is soft.  Musculoskeletal:        General: Normal range of motion.  Skin:    General: Skin is warm and dry.  Neurological:     General: No focal deficit present.     Mental Status: He is alert. Mental status is at baseline.  Psychiatric:        Attention and Perception: Attention normal.        Mood and Affect: Mood is elated. Affect  is labile.        Speech: Speech is rapid and pressured and tangential.        Behavior: Behavior is agitated. Behavior is not aggressive.        Thought Content: Thought content normal. Thought content does not include homicidal or suicidal ideation.        Cognition and Memory: Cognition normal.        Judgment: Judgment normal.    Review of Systems  Constitutional: Negative.   HENT: Negative.    Eyes: Negative.   Respiratory: Negative.    Cardiovascular: Negative.   Gastrointestinal: Negative.   Musculoskeletal: Negative.   Skin: Negative.   Neurological: Negative.   Psychiatric/Behavioral:  Positive for hallucinations. Negative for depression, substance abuse and suicidal ideas. The patient is nervous/anxious.    Blood pressure (!) 147/85, pulse (!) 105, temperature 98.1 F (36.7 C), temperature source Oral, resp. rate 18, height 6' (1.829 m), weight 87.1 kg, SpO2 100 %. Body mass index is 26.04 kg/m.   Treatment  Plan Summary: Medication management and Plan gradually increase dose of clozapine.  He had been on 350 mg at night until just a little over a week ago so I think we can probably quickly titrated up.  Patient has been enrolled in the [facility] system under my name.  Continue other medicines based on what he normally takes at home.  Included in group activities and daily assessment.  Alethia Berthold, MD 04/30/2022, 1:51 PM

## 2022-05-01 DIAGNOSIS — F203 Undifferentiated schizophrenia: Secondary | ICD-10-CM | POA: Diagnosis not present

## 2022-05-01 MED ORDER — MONTELUKAST SODIUM 10 MG PO TABS
10.0000 mg | ORAL_TABLET | Freq: Every day | ORAL | Status: DC
  Administered 2022-05-01 – 2022-05-05 (×3): 10 mg via ORAL
  Filled 2022-05-01 (×5): qty 1

## 2022-05-01 MED ORDER — CLOZAPINE 100 MG PO TABS
200.0000 mg | ORAL_TABLET | Freq: Every day | ORAL | Status: DC
  Administered 2022-05-01: 200 mg via ORAL
  Filled 2022-05-01: qty 2

## 2022-05-01 NOTE — Progress Notes (Signed)
Recreation Therapy Notes  INPATIENT RECREATION TR PLAN  Patient Details Name: Anthony Luna MRN: [id number] DOB: 11/07/1985 Today's Date: 05/01/2022  Rec Therapy Plan Is patient appropriate for Therapeutic Recreation?: Yes Treatment times per week: at least 3 Estimated Length of Stay: 5-7 days TR Treatment/Interventions: Group participation (Comment)  Discharge Criteria Pt will be discharged from therapy if:: Discharged Treatment plan/goals/alternatives discussed and agreed upon by:: Patient/family  Discharge Summary     Deni Berti 05/01/2022, 3:32 PM

## 2022-05-01 NOTE — Progress Notes (Signed)
[facility] MD Progress Note  05/01/2022 11:49 AM Anthony Luna  MRN:  [id number] Subjective: Follow-up for this 36 year old man with schizophrenia.  Patient has calmed down significantly from when he came in.  Found him today in his room resting in his bed but awake.  He had no psychiatric complaints.  Reported that he was generally feeling better.  He is taking care of his ADLs well and has not been hostile.  He mentioned to me the fact that he has asthma.  He is certainly does not seem to be short of breath but I reminded him that he had an albuterol inhaler he could ask for if he needed it. Principal Problem: Schizophrenia (HCC) Diagnosis: Principal Problem:   Schizophrenia (HCC)  Total Time spent with patient: 20 minutes  Past Psychiatric History: Past history of schizophrenia several prior hospitalizations recently had been pretty stable until his medications expired through no fault of his own.  Past Medical History:  Past Medical History:  Diagnosis Date   Depression    Since moving into new apartment 1 year ago   None to low serum cortisol response with adrenocorticotrophic hormone (ACTH) stimulation test    Schizophrenia, paranoid type (HCC)    History reviewed. No pertinent surgical history. Family History: History reviewed. No pertinent family history. Family Psychiatric  History: See previous Social History:  Social History   Substance and Sexual Activity  Alcohol Use Yes   Alcohol/week: 0.0 standard drinks of alcohol   Comment: "Special occasions"     Social History   Substance and Sexual Activity  Drug Use Yes   Frequency: 4.0 times per week   Types: Marijuana   Comment: "2 blunts"    Social History   Socioeconomic History   Marital status: Single    Spouse name: Not on file   Number of children: Not on file   Years of education: Not on file   Highest education level: Not on file  Occupational History   Not on file  Tobacco Use   Smoking status: Every  Day    Packs/day: 0.50    Types: Cigarettes    Start date: 11/29/2002   Smokeless tobacco: Former   Tobacco comments:    does not want to quit  Vaping Use   Vaping Use: Never used  Substance and Sexual Activity   Alcohol use: Yes    Alcohol/week: 0.0 standard drinks of alcohol    Comment: "Special occasions"   Drug use: Yes    Frequency: 4.0 times per week    Types: Marijuana    Comment: "2 blunts"   Sexual activity: Not on file  Other Topics Concern   Not on file  Social History Narrative   Not on file   Social Determinants of Health   Financial Resource Strain: Not on file  Food Insecurity: No Food Insecurity (04/28/2022)   Hunger Vital Sign    Worried About Running Out of Food in the Last Year: Never true    Ran Out of Food in the Last Year: Never true  Transportation Needs: No Transportation Needs (04/28/2022)   PRAPARE - Administrator, Civil Service (Medical): No    Lack of Transportation (Non-Medical): No  Physical Activity: Not on file  Stress: Not on file  Social Connections: Not on file   Additional Social History:                         Sleep: Fair  Appetite:  Fair  Current Medications: Current Facility-Administered Medications  Medication Dose Route Frequency Provider Last Rate Last Admin   acetaminophen (TYLENOL) tablet 650 mg  650 mg Oral Q6H PRN Dixon, Elray Bubaashaun M, NP       albuterol (VENTOLIN HFA) 108 (90 Base) MCG/ACT inhaler 1-2 puff  1-2 puff Inhalation Q6H PRN Shital Crayton, [name], MD       alum & mag hydroxide-simeth (MAALOX/MYLANTA) 200-200-20 MG/5ML suspension 30 mL  30 mL Oral Q4H PRN Dixon, Rashaun M, NP       benztropine (COGENTIN) tablet 1 mg  1 mg Oral BID Maxene Byington, [name], MD   1 mg at 05/01/22 [id number]   buPROPion (WELLBUTRIN XL) 24 hr tablet 150 mg  150 mg Oral Daily Lerry Linerixon, Rashaun M, NP   150 mg at 05/01/22 [id number]   clonazePAM (KLONOPIN) tablet 0.5 mg  0.5 mg Oral BID Claudeen Leason T, MD   0.5 mg at 05/01/22 [id number]   cloZAPine  (CLOZARIL) tablet 150 mg  150 mg Oral QHS Alijah Hyde T, MD   150 mg at 04/30/22 2103   docusate sodium (COLACE) capsule 100 mg  100 mg Oral BID Mac Dowdell T, MD   100 mg at 05/01/22 0903   fluticasone (FLONASE) 50 MCG/ACT nasal spray 2 spray  2 spray Each Nare Daily PRN Shellie Rogoff, [name], MD       hydrOXYzine (ATARAX) tablet 25 mg  25 mg Oral TID PRN Jearld Leschixon, Rashaun M, NP   25 mg at 04/30/22 2103   loratadine (CLARITIN) tablet 10 mg  10 mg Oral Daily Linette Gunderson, [name], MD   10 mg at 05/01/22 0904   magnesium hydroxide (MILK OF MAGNESIA) suspension 30 mL  30 mL Oral Daily PRN Jearld Leschixon, Rashaun M, NP       melatonin tablet 5 mg  5 mg Oral QHS Dixon, Rashaun M, NP   5 mg at 04/30/22 2103   montelukast (SINGULAIR) tablet 10 mg  10 mg Oral QHS Rian Koon, [name], MD       propranolol (INDERAL) tablet 20 mg  20 mg Oral BID Haylyn Halberg, [name], MD   20 mg at 05/01/22 [id number]    Lab Results:  Results for orders placed or performed during the hospital encounter of 04/28/22 (from the past 48 hour(s))  Lipid panel     Status: Abnormal   Collection Time: 04/30/22  6:37 AM  Result Value Ref Range   Cholesterol 172 0 - 200 mg/dL   Triglycerides 39 <213<150 mg/dL   HDL 37 (L) >08>40 mg/dL   Total CHOL/HDL Ratio 4.6 RATIO   VLDL 8 0 - 40 mg/dL   LDL Cholesterol 657127 (H) 0 - 99 mg/dL    Comment:        Total Cholesterol/HDL:CHD Risk Coronary Heart Disease Risk Table                     Men   Women  1/2 Average Risk   3.4   3.3  Average Risk       5.0   4.4  2 X Average Risk   9.6   7.1  3 X Average Risk  23.4   11.0        Use the calculated Patient Ratio above and the CHD Risk Table to determine the patient's CHD Risk.        ATP III CLASSIFICATION (LDL):  <100     mg/dL   Optimal  [id number]  mg/dL  Near or Above                    Optimal  130-159  mg/dL   Borderline  762-831  mg/dL   High  >517     mg/dL   Very High Performed at [facility], [address] [place], [place] [address]   Hemoglobin  A1c     Status: None   Collection Time: 04/30/22  6:37 AM  Result Value Ref Range   Hgb A1c MFr Bld 5.1 4.8 - 5.6 %    Comment: (NOTE) Pre diabetes:          5.7%-6.4%  Diabetes:              >6.4%  Glycemic control for   <7.0% adults with diabetes    Mean Plasma Glucose 99.67 mg/dL    Comment: Performed at [facility], [address], [place], [place] [address]    Blood Alcohol level:  Lab Results  Component Value Date   [facility] <10 04/27/2022   ETH <10 12/13/2018    Metabolic Disorder Labs: Lab Results  Component Value Date   HGBA1C 5.1 04/30/2022   MPG 99.67 04/30/2022   MPG 96.8 05/06/2018   Lab Results  Component Value Date   PROLACTIN 6.1 12/19/2014   PROLACTIN 1.3 (L) 11/30/2014   Lab Results  Component Value Date   CHOL 172 04/30/2022   TRIG 39 04/30/2022   HDL 37 (L) 04/30/2022   CHOLHDL 4.6 04/30/2022   VLDL 8 04/30/2022   LDLCALC 127 (H) 04/30/2022   LDLCALC 108 (H) 05/06/2018    Physical Findings: AIMS:  , ,  ,  ,    CIWA:    COWS:     Musculoskeletal: Strength & Muscle Tone: within normal limits Gait & Station: normal Patient leans: N/A  Psychiatric Specialty Exam:  Presentation  General Appearance: No data recorded Eye Contact:No data recorded Speech:No data recorded Speech Volume:No data recorded Handedness:No data recorded  Mood and Affect  Mood:No data recorded Affect:No data recorded  Thought Process  Thought Processes:No data recorded Descriptions of Associations:No data recorded Orientation:No data recorded Thought Content:No data recorded History of Schizophrenia/Schizoaffective disorder:Yes  Duration of Psychotic Symptoms:Greater than six months  Hallucinations:No data recorded Ideas of Reference:No data recorded Suicidal Thoughts:No data recorded Homicidal Thoughts:No data recorded  Sensorium  Memory:No data recorded Judgment:No data recorded Insight:No data recorded  Executive Functions   Concentration:No data recorded Attention Span:No data recorded Recall:No data recorded Fund of Knowledge:No data recorded Language:No data recorded  Psychomotor Activity  Psychomotor Activity:No data recorded  Assets  Assets:No data recorded  Sleep  Sleep:No data recorded   Physical Exam: Physical Exam Vitals reviewed.  Constitutional:      Appearance: Normal appearance.  HENT:     Head: Normocephalic and atraumatic.     Mouth/Throat:     Pharynx: Oropharynx is clear.  Eyes:     Pupils: Pupils are equal, round, and reactive to light.  Cardiovascular:     Rate and Rhythm: Normal rate and regular rhythm.  Pulmonary:     Effort: Pulmonary effort is normal.     Breath sounds: Normal breath sounds.  Abdominal:     General: Abdomen is flat.     Palpations: Abdomen is soft.  Musculoskeletal:        General: Normal range of motion.  Skin:    General: Skin is warm and dry.  Neurological:     General: No  focal deficit present.     Mental Status: He is alert. Mental status is at baseline.  Psychiatric:        Attention and Perception: Attention normal.        Mood and Affect: Mood normal. Affect is blunt.        Speech: Speech normal.        Behavior: Behavior is withdrawn. Behavior is cooperative.        Thought Content: Thought content normal.    Review of Systems  Constitutional: Negative.   HENT: Negative.    Eyes: Negative.   Respiratory: Negative.    Cardiovascular: Negative.   Gastrointestinal: Negative.   Musculoskeletal: Negative.   Skin: Negative.   Neurological: Negative.   Psychiatric/Behavioral: Negative.     Blood pressure 132/82, pulse 83, temperature 98.3 F (36.8 C), temperature source Oral, resp. rate 18, height 6' (1.829 m), weight 87.1 kg, SpO2 100 %. Body mass index is 26.04 kg/m.   Treatment Plan Summary: Medication management and Plan I will increase his clozapine dose to 200 mg at night.  As noted before he used to be on 350 until  just a week or so ago so I think we can safely try to titrate back up quickly.  Meanwhile I have added 10 mg Singulair at night just because of his ongoing mention and complaints about asthma.  Case reviewed with treatment team.  Mordecai Rasmussen, MD 05/01/2022, 11:49 AM

## 2022-05-01 NOTE — Group Note (Signed)
[facility] LCSW Group Therapy Note   Group Date: 05/01/2022 Start Time: 1300 End Time: 1400   Type of Therapy/Topic:  Group Therapy:  Emotion Regulation  Participation Level:  Did Not Attend   Mood:  Description of Group:    The purpose of this group is to assist patients in learning to regulate negative emotions and experience positive emotions. Patients will be guided to discuss ways in which they have been vulnerable to their negative emotions. These vulnerabilities will be juxtaposed with experiences of positive emotions or situations, and patients challenged to use positive emotions to combat negative ones. Special emphasis will be placed on coping with negative emotions in conflict situations, and patients will process healthy conflict resolution skills.  Therapeutic Goals: Patient will identify two positive emotions or experiences to reflect on in order to balance out negative emotions:  Patient will label two or more emotions that they find the most difficult to experience:  Patient will be able to demonstrate positive conflict resolution skills through discussion or role plays:   Summary of Patient Progress: Patient did not attend group despite encouraged participation.     Therapeutic Modalities:   Cognitive Behavioral Therapy Feelings Identification Dialectical Behavioral Therapy   Joseeduardo Brix W Kahlie Deutscher, LCSWA 

## 2022-05-01 NOTE — Progress Notes (Signed)
Recreation Therapy Notes  Date: 05/01/2022  Time: 10:35 am     Location: Craft room   Behavioral response: N/A   Intervention Topic: Happiness    Discussion/Intervention: Patient refused to attend group.   Clinical Observations/Feedback:  Patient refused to attend group.    [name] LRT/CTRS        Anthony Luna 05/01/2022 12:35 PM 

## 2022-05-01 NOTE — Progress Notes (Signed)
Recreation Therapy Notes  INPATIENT RECREATION THERAPY ASSESSMENT  Patient Details Name: Anthony Luna MRN: [id number] DOB: 09/19/1985 Today's Date: 05/01/2022       Information Obtained From: Patient  Able to Participate in Assessment/Interview: Yes  Patient Presentation: Responsive  Reason for Admission (Per Patient): Active Symptoms  Patient Stressors:    Coping Skills:   Film/video editor, Sports, Music, Exercise, Meditate  Leisure Interests (2+):  Social - Family, Social - Friends, Individual - TV, Exercise - Walking, Exercise - Paediatric nurse, Exercise - Running, Exercise - Jogging, Games - Cards, Music - Listen  Frequency of Recreation/Participation: Monthly  Awareness of Community Resources:  Yes  Community Resources:  Other (Comment) [employer])  Current Use: Yes  If no, Barriers?:    Expressed Interest in [employer] Information: Yes  County of Residence:  [place]  Patient Main Form of Transportation: Other (Comment) (Group home)  Patient Strengths:  Patience, Respectful, Hardworking  Patient Identified Areas of Improvement:  Being around positive people.  Patient Goal for Hospitalization:  Be on the right medicine. Be productive in society.  Current SI (including self-harm):  No  Current HI:  No  Current AVH: Yes (On and off)  Staff Intervention Plan: Group Attendance, Collaborate with Interdisciplinary Treatment Team  Consent to Intern Participation: N/A  Kayleah Appleyard 05/01/2022, 3:32 PM

## 2022-05-01 NOTE — Consult Note (Addendum)
PHARMACIST - PHYSICIAN ORDER COMMUNICATION  Traci Plemons is a 36 y.o. year old male with a history of schizophrenia on Clozapine PTA. Continuing this medication order as an inpatient requires that monitoring parameters per REMS requirements must be met.   Clozapine REMS Dispense Authorization was obtained, and will dispense inpatient.  RDA code [id number].  Verified Clozapine dose: 26m HS   Last ANC value and date reported on the Clozapine REMS website: 4Comstockmonitoring frequency: monthly Next AGardenreporting is due on (date) 12/13.   Thank you for allowing pharmacy to be a part of this patient's care.  ADarrick PennaClinical Pharmacist 05/01/2022 12:00 PM

## 2022-05-01 NOTE — Progress Notes (Signed)
Recreation Therapy Notes    Date: 05/01/2022   Time: 2:05pm   Location: Courtyard    Behavioral response: Appropriate   Group Type: Recreation and Leisure   Participation level: Active   Communication: Patient was social with peers and staff.   Comments: N/A   Katey Barrie LRT/CTRS          Dare Spillman 05/01/2022 3:08 PM

## 2022-05-01 NOTE — Progress Notes (Signed)
Patient presents with a better affect, less staring and rambling. Pt observed interacting appropriately with staff and peers on the unit. Pt compliant with medication administration per MD orders. Pt given education, support, and encouragement to be active in his treatment plan. Pt being monitored Q 15 minutes for safety per unit protocol, remains safe on the unit.

## 2022-05-01 NOTE — Plan of Care (Signed)
D: Patient alert and oriented. Patient denies pain. Patient denies anxiety and depression. Patient denies SI/HI. Patient endorses AVH.   A: Scheduled medications administered to patient, per MD orders.  Support and encouragement provided to patient.  Q15 minute safety checks maintained.   R: Patient compliant with medication administration and treatment plan. No adverse drug reactions noted. Patient remains safe on the unit at this time. Problem: Education: Goal: Knowledge of [facility] Education information/materials will improve Outcome: Progressing   Problem: Health Behavior/Discharge Planning: Goal: Compliance with treatment plan for underlying cause of condition will improve Outcome: Progressing   Problem: Physical Regulation: Goal: Ability to maintain clinical measurements within normal limits will improve Outcome: Progressing   Problem: Safety: Goal: Periods of time without injury will increase Outcome: Progressing

## 2022-05-02 DIAGNOSIS — F203 Undifferentiated schizophrenia: Secondary | ICD-10-CM | POA: Diagnosis not present

## 2022-05-02 MED ORDER — CLOZAPINE 25 MG PO TABS
250.0000 mg | ORAL_TABLET | Freq: Every day | ORAL | Status: DC
  Administered 2022-05-02: 250 mg via ORAL
  Filled 2022-05-02: qty 2

## 2022-05-02 NOTE — Plan of Care (Signed)
D- Patient alert and oriented. Patient presented in a pleasant mood on assessment reporting that he slept good last night and had no complaints to voice to this Clinical research associate. Patient denied anxiety, but reported depression on his self-inventory, stating that "people keep telling me not to take my medicine, so I don't know what to do or who to trust". Patient informed this Clinical research associate that another patient is the one who was telling him not to take his medication. This Clinical research associate explained to patient that if the doctor has medications prescribed for him, then he should take them, also if he has any questions or concerns, he should follow-up with the doctor. Patient verbalized understanding. Patient also denied SI, HI, AVH, and pain at this time. Per his self-inventory, patient's stated goal for today is to "listen and learn".  A- Scheduled medications administered to patient, per MD orders. Support and encouragement provided.  Routine safety checks conducted every 15 minutes.  Patient informed to notify staff with problems or concerns.  R- No adverse drug reactions noted. Patient contracts for safety at this time. Patient compliant with medications and treatment plan. Patient receptive, calm, and cooperative. Patient interacts well with others on the unit. Patient remains safe at this time.  Problem: Education: Goal: Knowledge of General Education information will improve Description: Including pain rating scale, medication(s)/side effects and non-pharmacologic comfort measures Outcome: Progressing   Problem: Health Behavior/Discharge Planning: Goal: Ability to manage health-related needs will improve Outcome: Progressing   Problem: Clinical Measurements: Goal: Ability to maintain clinical measurements within normal limits will improve Outcome: Progressing Goal: Will remain free from infection Outcome: Progressing Goal: Diagnostic test results will improve Outcome: Progressing Goal: Respiratory complications  will improve Outcome: Progressing Goal: Cardiovascular complication will be avoided Outcome: Progressing   Problem: Activity: Goal: Risk for activity intolerance will decrease Outcome: Progressing   Problem: Nutrition: Goal: Adequate nutrition will be maintained Outcome: Progressing   Problem: Coping: Goal: Level of anxiety will decrease Outcome: Progressing   Problem: Elimination: Goal: Will not experience complications related to bowel motility Outcome: Progressing Goal: Will not experience complications related to urinary retention Outcome: Progressing   Problem: Pain Managment: Goal: General experience of comfort will improve Outcome: Progressing   Problem: Safety: Goal: Ability to remain free from injury will improve Outcome: Progressing   Problem: Skin Integrity: Goal: Risk for impaired skin integrity will decrease Outcome: Progressing   Problem: Education: Goal: Knowledge of [place] [facility] Education information/materials will improve Outcome: Progressing Goal: Emotional status will improve Outcome: Progressing Goal: Mental status will improve Outcome: Progressing Goal: Verbalization of understanding the information provided will improve Outcome: Progressing   Problem: Activity: Goal: Interest or engagement in activities will improve Outcome: Progressing Goal: Sleeping patterns will improve Outcome: Progressing   Problem: Coping: Goal: Ability to verbalize frustrations and anger appropriately will improve Outcome: Progressing Goal: Ability to demonstrate self-control will improve Outcome: Progressing   Problem: Health Behavior/Discharge Planning: Goal: Identification of resources available to assist in meeting health care needs will improve Outcome: Progressing Goal: Compliance with treatment plan for underlying cause of condition will improve Outcome: Progressing   Problem: Physical Regulation: Goal: Ability to maintain clinical measurements  within normal limits will improve Outcome: Progressing   Problem: Safety: Goal: Periods of time without injury will increase Outcome: Progressing

## 2022-05-02 NOTE — Progress Notes (Signed)
[facility] MD Progress Note  05/02/2022 11:29 AM Anthony Luna  MRN:  [id number] Subjective: Patient seen.  He has no new complaints.  Patient is much more pleasant than when he first came in.  Also less agitated.  Able to sit and have a lucid conversation.  Does not come across as bizarre or paranoid.  Taking care of his ADLs neatly.  Appropriate interaction with staff and peers.  Compliant with medicine. Principal Problem: Schizophrenia (HCC) Diagnosis: Principal Problem:   Schizophrenia (HCC)  Total Time spent with patient: 30 minutes  Past Psychiatric History: Past history of longstanding schizophrenia who recently stabilized  Past Medical History:  Past Medical History:  Diagnosis Date   Depression    Since moving into new apartment 1 year ago   None to low serum cortisol response with adrenocorticotrophic hormone (ACTH) stimulation test    Schizophrenia, paranoid type (HCC)    History reviewed. No pertinent surgical history. Family History: History reviewed. No pertinent family history. Family Psychiatric  History: See previous Social History:  Social History   Substance and Sexual Activity  Alcohol Use Yes   Alcohol/week: 0.0 standard drinks of alcohol   Comment: "Special occasions"     Social History   Substance and Sexual Activity  Drug Use Yes   Frequency: 4.0 times per week   Types: Marijuana   Comment: "2 blunts"    Social History   Socioeconomic History   Marital status: Single    Spouse name: Not on file   Number of children: Not on file   Years of education: Not on file   Highest education level: Not on file  Occupational History   Not on file  Tobacco Use   Smoking status: Every Day    Packs/day: 0.50    Types: Cigarettes    Start date: 11/29/2002   Smokeless tobacco: Former   Tobacco comments:    does not want to quit  Vaping Use   Vaping Use: Never used  Substance and Sexual Activity   Alcohol use: Yes    Alcohol/week: 0.0 standard drinks  of alcohol    Comment: "Special occasions"   Drug use: Yes    Frequency: 4.0 times per week    Types: Marijuana    Comment: "2 blunts"   Sexual activity: Not on file  Other Topics Concern   Not on file  Social History Narrative   Not on file   Social Determinants of Health   Financial Resource Strain: Not on file  Food Insecurity: No Food Insecurity (04/28/2022)   Hunger Vital Sign    Worried About Running Out of Food in the Last Year: Never true    Ran Out of Food in the Last Year: Never true  Transportation Needs: No Transportation Needs (04/28/2022)   PRAPARE - Administrator, Civil Service (Medical): No    Lack of Transportation (Non-Medical): No  Physical Activity: Not on file  Stress: Not on file  Social Connections: Not on file   Additional Social History:                         Sleep: Fair  Appetite:  Fair  Current Medications: Current Facility-Administered Medications  Medication Dose Route Frequency Provider Last Rate Last Admin   acetaminophen (TYLENOL) tablet 650 mg  650 mg Oral Q6H PRN Dixon, Rashaun M, NP       albuterol (VENTOLIN HFA) 108 (90 Base) MCG/ACT inhaler 1-2 puff  1-2 puff Inhalation Q6H PRN Remington Skalsky, [name], MD       alum & mag hydroxide-simeth (MAALOX/MYLANTA) 200-200-20 MG/5ML suspension 30 mL  30 mL Oral Q4H PRN Dixon, Rashaun M, NP       benztropine (COGENTIN) tablet 1 mg  1 mg Oral BID Tanith Dagostino T, MD   1 mg at 05/02/22 0753   buPROPion (WELLBUTRIN XL) 24 hr tablet 150 mg  150 mg Oral Daily Dixon, Rashaun M, NP   150 mg at 05/02/22 0753   clonazePAM (KLONOPIN) tablet 0.5 mg  0.5 mg Oral BID Jeena Arnett T, MD   0.5 mg at 05/02/22 0753   cloZAPine (CLOZARIL) tablet 250 mg  250 mg Oral QHS Mikolaj Woolstenhulme T, MD       docusate sodium (COLACE) capsule 100 mg  100 mg Oral BID Davidjames Blansett T, MD   100 mg at 05/02/22 0753   fluticasone (FLONASE) 50 MCG/ACT nasal spray 2 spray  2 spray Each Nare Daily PRN Erline Siddoway, [name], MD        hydrOXYzine (ATARAX) tablet 25 mg  25 mg Oral TID PRN Jearld Lesch, NP   25 mg at 05/01/22 2121   loratadine (CLARITIN) tablet 10 mg  10 mg Oral Daily Raidyn Wassink T, MD   10 mg at 05/02/22 0754   magnesium hydroxide (MILK OF MAGNESIA) suspension 30 mL  30 mL Oral Daily PRN Jearld Lesch, NP       melatonin tablet 5 mg  5 mg Oral QHS Dixon, Rashaun M, NP   5 mg at 05/01/22 2121   montelukast (SINGULAIR) tablet 10 mg  10 mg Oral QHS Clea Dubach T, MD   10 mg at 05/01/22 2122   propranolol (INDERAL) tablet 20 mg  20 mg Oral BID Una Yeomans, [name], MD   20 mg at 05/02/22 0754    Lab Results: No results found for this or any previous visit (from the past 48 hour(s)).  Blood Alcohol level:  Lab Results  Component Value Date   ETH <10 04/27/2022   ETH <10 12/13/2018    Metabolic Disorder Labs: Lab Results  Component Value Date   HGBA1C 5.1 04/30/2022   MPG 99.67 04/30/2022   MPG 96.8 05/06/2018   Lab Results  Component Value Date   PROLACTIN 6.1 12/19/2014   PROLACTIN 1.3 (L) 11/30/2014   Lab Results  Component Value Date   CHOL 172 04/30/2022   TRIG 39 04/30/2022   HDL 37 (L) 04/30/2022   CHOLHDL 4.6 04/30/2022   VLDL 8 04/30/2022   LDLCALC 127 (H) 04/30/2022   LDLCALC 108 (H) 05/06/2018    Physical Findings: AIMS:  , ,  ,  ,    CIWA:    COWS:     Musculoskeletal: Strength & Muscle Tone: within normal limits Gait & Station: normal Patient leans: N/A  Psychiatric Specialty Exam:  Presentation  General Appearance: No data recorded Eye Contact:No data recorded Speech:No data recorded Speech Volume:No data recorded Handedness:No data recorded  Mood and Affect  Mood:No data recorded Affect:No data recorded  Thought Process  Thought Processes:No data recorded Descriptions of Associations:No data recorded Orientation:No data recorded Thought Content:No data recorded History of Schizophrenia/Schizoaffective disorder:Yes  Duration of Psychotic  Symptoms:Greater than six months  Hallucinations:No data recorded Ideas of Reference:No data recorded Suicidal Thoughts:No data recorded Homicidal Thoughts:No data recorded  Sensorium  Memory:No data recorded Judgment:No data recorded Insight:No data recorded  Executive Functions  Concentration:No data recorded Attention Span:No  data recorded Recall:No data recorded Fund of Knowledge:No data recorded Language:No data recorded  Psychomotor Activity  Psychomotor Activity:No data recorded  Assets  Assets:No data recorded  Sleep  Sleep:No data recorded   Physical Exam: Physical Exam Vitals and nursing note reviewed.  Constitutional:      Appearance: Normal appearance.  HENT:     Head: Normocephalic and atraumatic.     Mouth/Throat:     Pharynx: Oropharynx is clear.  Eyes:     Pupils: Pupils are equal, round, and reactive to light.  Cardiovascular:     Rate and Rhythm: Normal rate and regular rhythm.  Pulmonary:     Effort: Pulmonary effort is normal.     Breath sounds: Normal breath sounds.  Abdominal:     General: Abdomen is flat.     Palpations: Abdomen is soft.  Musculoskeletal:        General: Normal range of motion.  Skin:    General: Skin is warm and dry.  Neurological:     General: No focal deficit present.     Mental Status: He is alert. Mental status is at baseline.  Psychiatric:        Attention and Perception: Attention normal.        Mood and Affect: Mood normal.        Speech: Speech normal.        Behavior: Behavior normal.        Thought Content: Thought content normal.        Cognition and Memory: Cognition normal.        Judgment: Judgment normal.    Review of Systems  Constitutional: Negative.   HENT: Negative.    Eyes: Negative.   Respiratory: Negative.    Cardiovascular: Negative.   Gastrointestinal: Negative.   Musculoskeletal: Negative.   Skin: Negative.   Neurological: Negative.   Psychiatric/Behavioral: Negative.      Blood pressure (!) 120/92, pulse (!) 120, temperature 98 F (36.7 C), temperature source Oral, resp. rate 18, height 6' (1.829 m), weight 87.1 kg, SpO2 100 %. Body mass index is 26.04 kg/m.   Treatment Plan Summary: Medication management and Plan talked with patient about plan to continue titrating back his clozapine.  It was not all that long ago that he was on the 350 and he is tolerating a rapid titration so I think we can go ahead and go up to 250 tonight.  I am hoping that with stabilization we can anticipate probable discharge by Monday.  Mordecai Rasmussen, MD 05/02/2022, 11:29 AM

## 2022-05-02 NOTE — Group Note (Signed)
[facility] LCSW Group Therapy Note   Group Date: 05/02/2022 Start Time: 1300 End Time: 1400  Type of Therapy and Topic:  Group Therapy:  Feelings around Relapse and Recovery  Participation Level:  Active    Description of Group:    Patients in this group will discuss emotions they experience before and after a relapse. They will process how experiencing these feelings, or avoidance of experiencing them, relates to having a relapse. Facilitator will guide patients to explore emotions they have related to recovery. Patients will be encouraged to process which emotions are more powerful. They will be guided to discuss the emotional reaction significant others in their lives may have to patients' relapse or recovery. Patients will be assisted in exploring ways to respond to the emotions of others without this contributing to a relapse.  Therapeutic Goals: Patient will identify two or more emotions that lead to relapse for them:  Patient will identify two emotions that result when they relapse:  Patient will identify two emotions related to recovery:  Patient will demonstrate ability to communicate their needs through discussion and/or role plays.   Summary of Patient Progress: Patient was present for the entirety of the group process. He defined relapse as a return to something that you were trying not to do. Pt shares that he feels that relapse is definitely a part of the recovery process. Smoking cigarettes was identified as what he was trying not to relapse into. Loneliness, sadness, anxiety, stop taking medications, and smoking weed were identified as risk factors for relapse. Pt stated that hope is a resiliency factor that helps him avoid relapse. Continued outpatient treatment, talking to someone, and having a good sense of humor were identified as things he can do to focus on his recovery at discharge. He appeared to have slight insight into this topic. Pt was open and receptive to  feedback/comments from both peers and facilitator.   Therapeutic Modalities:   Cognitive Behavioral Therapy Solution-Focused Therapy Assertiveness Training Relapse Prevention Therapy   Glenis Smoker, LCSW

## 2022-05-03 DIAGNOSIS — F203 Undifferentiated schizophrenia: Secondary | ICD-10-CM | POA: Diagnosis not present

## 2022-05-03 MED ORDER — CLOZAPINE 100 MG PO TABS
300.0000 mg | ORAL_TABLET | Freq: Every day | ORAL | Status: DC
  Filled 2022-05-03: qty 3

## 2022-05-03 MED ORDER — NICOTINE 21 MG/24HR TD PT24
21.0000 mg | MEDICATED_PATCH | Freq: Every day | TRANSDERMAL | Status: DC
  Administered 2022-05-04 – 2022-05-06 (×3): 21 mg via TRANSDERMAL
  Filled 2022-05-03 (×3): qty 1

## 2022-05-03 NOTE — Progress Notes (Signed)
[facility] MD Progress Note  05/03/2022 8:40 AM Anthony Luna  MRN:  [id number] Subjective:  Chart and records reviewed.  Update regarding this patient provided by Dr. Weber Cooks.  Today, patient is soft spoken.  Tells me he is feeling tired: Energy is low," despite good sleep last night.  Tells me that he is here "to get my meds straight, my doctor got fired and I ran out of my medications."  States he has been on Clozaril for 2 to 3 years and does not know why he is taking them.  "What it for doc?"  "I do not know anything about my medications."  We  discussed this.  He pointed out that he does not want to be on high dosages of medications.  Reassured that he is not on such.  He does contend that he will occasionally have flareups of paranoia and hallucinations.  No hallucinatory phenomenon in the past 24 hours.  Does endorse mild paranoia and rates it a 1 out of 10 with 10 being high.  Denies excessive drooling or constipation.  At the end of the interview, he shuts down and does not answer any questions, and then eventually walks out of the office.  Principal Problem: Schizophrenia ([place]) Diagnosis: Principal Problem:   Schizophrenia ([place])  Total Time spent with patient: 29 minutes  Past Psychiatric History: Past history of longstanding schizophrenia who recently stabilized  Past Medical History:  Past Medical History:  Diagnosis Date   Depression    Since moving into new apartment 1 year ago   None to low serum cortisol response with adrenocorticotrophic hormone (ACTH) stimulation test    Schizophrenia, paranoid type ([place])    History reviewed. No pertinent surgical history. Family History: History reviewed. No pertinent family history. Family Psychiatric  History: See previous Social History:  Social History   Substance and Sexual Activity  Alcohol Use Yes   Alcohol/week: 0.0 standard drinks of alcohol   Comment: "Special occasions"     Social History   Substance and Sexual  Activity  Drug Use Yes   Frequency: 4.0 times per week   Types: Marijuana   Comment: "2 blunts"    Social History   Socioeconomic History   Marital status: Single    Spouse name: Not on file   Number of children: Not on file   Years of education: Not on file   Highest education level: Not on file  Occupational History   Not on file  Tobacco Use   Smoking status: Every Day    Packs/day: 0.50    Types: Cigarettes    Start date: 11/29/2002   Smokeless tobacco: Former   Tobacco comments:    does not want to quit  Vaping Use   Vaping Use: Never used  Substance and Sexual Activity   Alcohol use: Yes    Alcohol/week: 0.0 standard drinks of alcohol    Comment: "Special occasions"   Drug use: Yes    Frequency: 4.0 times per week    Types: Marijuana    Comment: "2 blunts"   Sexual activity: Not on file  Other Topics Concern   Not on file  Social History Narrative   Not on file   Social Determinants of Health   Financial Resource Strain: Not on file  Food Insecurity: No Food Insecurity (04/28/2022)   Hunger Vital Sign    Worried About Running Out of Food in the Last Year: Never true    Ran Out of Food in the Last  Year: Never true  Transportation Needs: No Transportation Needs (04/28/2022)   PRAPARE - Administrator, Civil Service (Medical): No    Lack of Transportation (Non-Medical): No  Physical Activity: Not on file  Stress: Not on file  Social Connections: Not on file   Additional Social History:                         Sleep: Fair  Appetite:  Fair  Current Medications: Current Facility-Administered Medications  Medication Dose Route Frequency Provider Last Rate Last Admin   acetaminophen (TYLENOL) tablet 650 mg  650 mg Oral Q6H PRN Dixon, Rashaun M, NP       albuterol (VENTOLIN HFA) 108 (90 Base) MCG/ACT inhaler 1-2 puff  1-2 puff Inhalation Q6H PRN [name], [name], MD       alum & mag hydroxide-simeth (MAALOX/MYLANTA) 200-200-20 MG/5ML  suspension 30 mL  30 mL Oral Q4H PRN Dixon, Rashaun M, NP       benztropine (COGENTIN) tablet 1 mg  1 mg Oral BID [name], [name], MD   1 mg at 05/02/22 1713   buPROPion (WELLBUTRIN XL) 24 hr tablet 150 mg  150 mg Oral Daily Dixon, Rashaun M, NP   150 mg at 05/02/22 0753   clonazePAM (KLONOPIN) tablet 0.5 mg  0.5 mg Oral BID [name], [name], MD   0.5 mg at 05/02/22 1713   cloZAPine (CLOZARIL) tablet 300 mg  300 mg Oral QHS Reggie Pile, MD       docusate sodium (COLACE) capsule 100 mg  100 mg Oral BID [name], [name], MD   100 mg at 05/02/22 1713   fluticasone (FLONASE) 50 MCG/ACT nasal spray 2 spray  2 spray Each Nare Daily PRN [name], [name], MD       hydrOXYzine (ATARAX) tablet 25 mg  25 mg Oral TID PRN Jearld Lesch, NP   25 mg at 05/01/22 2121   loratadine (CLARITIN) tablet 10 mg  10 mg Oral Daily [name], [name], MD   10 mg at 05/02/22 0754   magnesium hydroxide (MILK OF MAGNESIA) suspension 30 mL  30 mL Oral Daily PRN Jearld Lesch, NP       melatonin tablet 5 mg  5 mg Oral QHS Dixon, Rashaun M, NP   5 mg at 05/02/22 2123   montelukast (SINGULAIR) tablet 10 mg  10 mg Oral QHS [name], [name], MD   10 mg at 05/01/22 2122   propranolol (INDERAL) tablet 20 mg  20 mg Oral BID [name], [name], MD   20 mg at 05/02/22 1713    Lab Results: No results found for this or any previous visit (from the past 48 hour(s)).  Blood Alcohol level:  Lab Results  Component Value Date   ETH <10 04/27/2022   ETH <10 12/13/2018    Metabolic Disorder Labs: Lab Results  Component Value Date   HGBA1C 5.1 04/30/2022   MPG 99.67 04/30/2022   MPG 96.8 05/06/2018   Lab Results  Component Value Date   PROLACTIN 6.1 12/19/2014   PROLACTIN 1.3 (L) 11/30/2014   Lab Results  Component Value Date   CHOL 172 04/30/2022   TRIG 39 04/30/2022   HDL 37 (L) 04/30/2022   CHOLHDL 4.6 04/30/2022   VLDL 8 04/30/2022   LDLCALC 127 (H) 04/30/2022   LDLCALC 108 (H) 05/06/2018    Physical Findings: AIMS:   , ,  ,  ,  CIWA:    COWS:     Musculoskeletal: Strength & Muscle Tone: within normal limits Gait & Station: normal Patient leans: N/A  Psychiatric Specialty Exam:  Presentation  General Appearance: No data recorded Eye Contact:No data recorded Speech:No data recorded Speech Volume:No data recorded Handedness:No data recorded  Mood and Affect  Mood is okay.  Affect restricted  Thought Process  Thought process are fairly linear History of Schizophrenia/Schizoaffective disorder:Yes  Duration of Psychotic Symptoms:Greater than six months  Patient endorses paranoia No suicidal thoughts  Sensorium  Judgment and insight are fair  Executive  Psychomotor Activity  Psychomotor Activity: No retardation   Physical Exam: Physical Exam Vitals and nursing note reviewed.  Constitutional:      Appearance: Normal appearance.  HENT:     Head: Normocephalic and atraumatic.     Mouth/Throat:     Pharynx: Oropharynx is clear.  Eyes:     Pupils: Pupils are equal, round, and reactive to light.  Cardiovascular:     Rate and Rhythm: Normal rate and regular rhythm.  Pulmonary:     Effort: Pulmonary effort is normal.     Breath sounds: Normal breath sounds.  Abdominal:     General: Abdomen is flat.     Palpations: Abdomen is soft.  Musculoskeletal:        General: Normal range of motion.  Skin:    General: Skin is warm and dry.  Neurological:     General: No focal deficit present.     Mental Status: He is alert. Mental status is at baseline.  Psychiatric:        Attention and Perception: Attention normal.        Mood and Affect: Mood normal.        Speech: Speech normal.        Behavior: Behavior normal.        Thought Content: Thought content normal.        Cognition and Memory: Cognition normal.        Judgment: Judgment normal.    Review of Systems  Constitutional: Negative.   HENT: Negative.    Eyes: Negative.   Respiratory: Negative.    Cardiovascular:  Negative.   Gastrointestinal: Negative.   Musculoskeletal: Negative.   Skin: Negative.   Neurological: Negative.   Psychiatric/Behavioral: Negative.     Blood pressure (!) 118/96, pulse (!) 105, temperature 98.2 F (36.8 C), temperature source Oral, resp. rate 18, height 6' (1.829 m), weight 87.1 kg, SpO2 100 %. Body mass index is 26.04 kg/m.   Treatment Plan Summary: Medication management and Plan talked with patient about plan to continue titrating back his clozapine.  It was not all that long ago that he was on the 350 and he is tolerating a rapid titration so I think we can go ahead and go up to 250 tonight.  I am hoping that with stabilization we can anticipate probable discharge by Monday.  11/18 Increase Clozaril to 300 mg nightly.   [id number] Reggie Pile, MD 05/03/2022, 8:40 AM

## 2022-05-03 NOTE — Plan of Care (Signed)
D- Patient alert and oriented. Patient presented in a pleasant mood on assessment reporting that he slept good last night and had no complaints to voice to this Clinical research associate. Patient denied any depression, but endorsed both hopelessness and anxiety, rating them a "2/10" and "3/10". Patient stated that "I don't want to be on a lot of medicine, I'm afraid of what it'll do to my mind and body". Patient also stated that he has been feeling restless, "I'm tired and I want to sit down, but the voices say don't sit down". However, patient did deny SI, HI, AVH, and pain at this time, stating "the voices aren't bothering me too much at this time". Per his self-inventory, patient's goal for today is to "protect myself", in which he will "stay away from trouble", in order to achieve his goal.  A- Scheduled medications administered to patient, per MD orders. Support and encouragement provided.  Routine safety checks conducted every 15 minutes.  Patient informed to notify staff with problems or concerns.  R- No adverse drug reactions noted. Patient contracts for safety at this time. Patient compliant with medications and treatment plan. Patient receptive, calm, and cooperative. Patient interacts well with others on the unit. Patient remains safe at this time.  Problem: Education: Goal: Knowledge of General Education information will improve Description: Including pain rating scale, medication(s)/side effects and non-pharmacologic comfort measures Outcome: Progressing   Problem: Health Behavior/Discharge Planning: Goal: Ability to manage health-related needs will improve Outcome: Progressing   Problem: Clinical Measurements: Goal: Ability to maintain clinical measurements within normal limits will improve Outcome: Progressing Goal: Will remain free from infection Outcome: Progressing Goal: Diagnostic test results will improve Outcome: Progressing Goal: Respiratory complications will improve Outcome:  Progressing Goal: Cardiovascular complication will be avoided Outcome: Progressing   Problem: Activity: Goal: Risk for activity intolerance will decrease Outcome: Progressing   Problem: Nutrition: Goal: Adequate nutrition will be maintained Outcome: Progressing   Problem: Coping: Goal: Level of anxiety will decrease Outcome: Progressing   Problem: Elimination: Goal: Will not experience complications related to bowel motility Outcome: Progressing Goal: Will not experience complications related to urinary retention Outcome: Progressing   Problem: Pain Managment: Goal: General experience of comfort will improve Outcome: Progressing   Problem: Safety: Goal: Ability to remain free from injury will improve Outcome: Progressing   Problem: Skin Integrity: Goal: Risk for impaired skin integrity will decrease Outcome: Progressing   Problem: Education: Goal: Knowledge of [place] [facility] Education information/materials will improve Outcome: Progressing Goal: Emotional status will improve Outcome: Progressing Goal: Mental status will improve Outcome: Progressing Goal: Verbalization of understanding the information provided will improve Outcome: Progressing   Problem: Activity: Goal: Interest or engagement in activities will improve Outcome: Progressing Goal: Sleeping patterns will improve Outcome: Progressing   Problem: Coping: Goal: Ability to verbalize frustrations and anger appropriately will improve Outcome: Progressing Goal: Ability to demonstrate self-control will improve Outcome: Progressing   Problem: Health Behavior/Discharge Planning: Goal: Identification of resources available to assist in meeting health care needs will improve Outcome: Progressing Goal: Compliance with treatment plan for underlying cause of condition will improve Outcome: Progressing   Problem: Physical Regulation: Goal: Ability to maintain clinical measurements within normal limits  will improve Outcome: Progressing   Problem: Safety: Goal: Periods of time without injury will increase Outcome: Progressing

## 2022-05-03 NOTE — Progress Notes (Signed)
Patient alert and oriented x 4, affect is flat but brightens upon approach no distress noted she denies SI/HI/AVH but appears suspicious of staff. 15 minutes safety checks maintained will continue to monitor.

## 2022-05-04 DIAGNOSIS — F203 Undifferentiated schizophrenia: Secondary | ICD-10-CM | POA: Diagnosis not present

## 2022-05-04 MED ORDER — CLOZAPINE 25 MG PO TABS
350.0000 mg | ORAL_TABLET | Freq: Every day | ORAL | Status: DC
  Administered 2022-05-04 – 2022-05-05 (×2): 350 mg via ORAL
  Filled 2022-05-04 (×2): qty 2

## 2022-05-04 NOTE — Progress Notes (Signed)
Patient alert and oriented x 4, denies SI/HI/AVH interacting appropriately with peers and staff, no distress noted, 15 minutes safety checks maintained will continue to monitor.

## 2022-05-04 NOTE — Group Note (Signed)
LCSW Group Therapy Note   Group Date: 05/04/2022 Start Time: 1300 End Time: 1400   Type of Therapy and Topic:  Group Therapy: Challenging Core Beliefs  Participation Level:  Active  Description of Group:  Patients were educated about core beliefs and asked to identify one harmful core belief that they have. Patients were asked to explore from where those beliefs originate. Patients were asked to discuss how those beliefs make them feel and the resulting behaviors of those beliefs. They were then be asked if those beliefs are true and, if so, what evidence they have to support them. Lastly, group members were challenged to replace those negative core beliefs with helpful beliefs.   Therapeutic Goals:   1. Patient will identify harmful core beliefs and explore the origins of such beliefs. 2. Patient will identify feelings and behaviors that result from those core beliefs. 3. Patient will discuss whether such beliefs are true. 4.  Patient will replace harmful core beliefs with helpful ones.  Summary of Patient Progress:  Patient was an active participant in group. Patient was supportive of others.  Patient showed fair insight.  Patient was able to identify his values as well as values for his family, friends and society.  He was able to identify why these values mattered as well as why others valued different values.     Therapeutic Modalities: Cognitive Behavioral Therapy; Solution-Focused Therapy   Harden Mo, LCSWA 05/04/2022  2:24 PM

## 2022-05-04 NOTE — Plan of Care (Signed)
D: Patient alert and oriented. Patient denies pain. Patient denies anxiety and depression. Patient denies SI/HI/AVH. Patient frequently observed around the milieu interacting with peers.   A: Scheduled medications administered to patient, per MD orders.  Support and encouragement provided to patient.  Q15 minute safety checks maintained.   R: Patient compliant with medication administration and treatment plan. No adverse drug reactions noted. Patient remains safe on the unit at this time. Problem: Education: Goal: Knowledge of  General Education information/materials will improve Outcome: Progressing Goal: Verbalization of understanding the information provided will improve Outcome: Progressing   Problem: Health Behavior/Discharge Planning: Goal: Compliance with treatment plan for underlying cause of condition will improve Outcome: Progressing   Problem: Physical Regulation: Goal: Ability to maintain clinical measurements within normal limits will improve Outcome: Progressing   Problem: Safety: Goal: Periods of time without injury will increase Outcome: Progressing

## 2022-05-04 NOTE — Progress Notes (Addendum)
[facility] MD Progress Note  05/04/2022 9:04 AM Anthony Luna  MRN:  [id number] Subjective:  11/19 Patient apologizes for walking out of the office yesterday, "I didn't feel like talking."  When seen in the halls, he seems to go from being very reserved to cheerful and approachable.  Nevertheless he has been calm and cooperative on the unit this weekend.  Tells me that he is missing his family and wants to be at the group home before Thanksgiving.  He asks if he will be.  I informed him that it would be up to his primary psychiatrist, Dr. Toni Amend.  He denies depression today.  Does endorse mild auditory hallucinations and paranoia that are negativistic or command.  He reports mild constipation.  Slept well last night.  No new medical problems.  11/18 Chart and records reviewed.  Update regarding this patient provided by Dr. Toni Amend.  Today, patient is soft spoken.  Tells me he is feeling tired: Energy is low," despite good sleep last night.  Tells me that he is here "to get my meds straight, my doctor got fired and I ran out of my medications."  States he has been on Clozaril for 2 to 3 years and does not know why he is taking them.  "What it for doc?"  "I do not know anything about my medications."  We  discussed this.  He pointed out that he does not want to be on high dosages of medications.  Reassured that he is not on such.  He does contend that he will occasionally have flareups of paranoia and hallucinations.  No hallucinatory phenomenon in the past 24 hours.  Does endorse mild paranoia and rates it a 1 out of 10 with 10 being high.  Denies excessive drooling or constipation.  At the end of the interview, he shuts down and does not answer any questions, and then eventually walks out of the office.  Principal Problem: Schizophrenia (HCC) Diagnosis: Principal Problem:   Schizophrenia (HCC)  Total Time spent with patient: 28 minutes  Past Psychiatric History: Past history of longstanding  schizophrenia who recently stabilized  Past Medical History:  Past Medical History:  Diagnosis Date   Depression    Since moving into new apartment 1 year ago   None to low serum cortisol response with adrenocorticotrophic hormone (ACTH) stimulation test    Schizophrenia, paranoid type (HCC)    History reviewed. No pertinent surgical history. Family History: History reviewed. No pertinent family history. Family Psychiatric  History: See previous Social History:  Social History   Substance and Sexual Activity  Alcohol Use Yes   Alcohol/week: 0.0 standard drinks of alcohol   Comment: "Special occasions"     Social History   Substance and Sexual Activity  Drug Use Yes   Frequency: 4.0 times per week   Types: Marijuana   Comment: "2 blunts"    Social History   Socioeconomic History   Marital status: Single    Spouse name: Not on file   Number of children: Not on file   Years of education: Not on file   Highest education level: Not on file  Occupational History   Not on file  Tobacco Use   Smoking status: Every Day    Packs/day: 0.50    Types: Cigarettes    Start date: 11/29/2002   Smokeless tobacco: Former   Tobacco comments:    does not want to quit  Vaping Use   Vaping Use: Never used  Substance and  Sexual Activity   Alcohol use: Yes    Alcohol/week: 0.0 standard drinks of alcohol    Comment: "Special occasions"   Drug use: Yes    Frequency: 4.0 times per week    Types: Marijuana    Comment: "2 blunts"   Sexual activity: Not on file  Other Topics Concern   Not on file  Social History Narrative   Not on file   Social Determinants of Health   Financial Resource Strain: Not on file  Food Insecurity: No Food Insecurity (04/28/2022)   Hunger Vital Sign    Worried About Running Out of Food in the Last Year: Never true    Ran Out of Food in the Last Year: Never true  Transportation Needs: No Transportation Needs (04/28/2022)   PRAPARE - Therapist, art (Medical): No    Lack of Transportation (Non-Medical): No  Physical Activity: Not on file  Stress: Not on file  Social Connections: Not on file   Additional Social History:                         Sleep: Fair  Appetite:  Fair  Current Medications: Current Facility-Administered Medications  Medication Dose Route Frequency Provider Last Rate Last Admin   acetaminophen (TYLENOL) tablet 650 mg  650 mg Oral Q6H PRN Dixon, Rashaun M, NP       albuterol (VENTOLIN HFA) 108 (90 Base) MCG/ACT inhaler 1-2 puff  1-2 puff Inhalation Q6H PRN [name], [name], MD       alum & mag hydroxide-simeth (MAALOX/MYLANTA) 200-200-20 MG/5ML suspension 30 mL  30 mL Oral Q4H PRN Dixon, Rashaun M, NP       benztropine (COGENTIN) tablet 1 mg  1 mg Oral BID [name], [name], MD   1 mg at 05/04/22 0803   buPROPion (WELLBUTRIN XL) 24 hr tablet 150 mg  150 mg Oral Daily Dixon, Rashaun M, NP   150 mg at 05/04/22 0803   clonazePAM (KLONOPIN) tablet 0.5 mg  0.5 mg Oral BID [name], [name], MD   0.5 mg at 05/04/22 9509   cloZAPine (CLOZARIL) tablet 300 mg  300 mg Oral QHS Reggie Pile, MD       docusate sodium (COLACE) capsule 100 mg  100 mg Oral BID [name], [name], MD   100 mg at 05/04/22 0803   fluticasone (FLONASE) 50 MCG/ACT nasal spray 2 spray  2 spray Each Nare Daily PRN [name], [name], MD       hydrOXYzine (ATARAX) tablet 25 mg  25 mg Oral TID PRN Jearld Lesch, NP   25 mg at 05/01/22 2121   loratadine (CLARITIN) tablet 10 mg  10 mg Oral Daily [name], [name], MD   10 mg at 05/04/22 0804   magnesium hydroxide (MILK OF MAGNESIA) suspension 30 mL  30 mL Oral Daily PRN Jearld Lesch, NP       melatonin tablet 5 mg  5 mg Oral QHS Dixon, Rashaun M, NP   5 mg at 05/03/22 2100   montelukast (SINGULAIR) tablet 10 mg  10 mg Oral QHS [name], [name], MD   10 mg at 05/01/22 2122   nicotine (NICODERM CQ - dosed in mg/24 hours) patch 21 mg  21 mg Transdermal Daily Reggie Pile, MD   21 mg  at 05/04/22 0803   propranolol (INDERAL) tablet 20 mg  20 mg Oral BID [name], [name], MD   20 mg  at 05/04/22 0803    Lab Results: No results found for this or any previous visit (from the past 48 hour(s)).  Blood Alcohol level:  Lab Results  Component Value Date   ETH <10 04/27/2022   ETH <10 12/13/2018    Metabolic Disorder Labs: Lab Results  Component Value Date   HGBA1C 5.1 04/30/2022   MPG 99.67 04/30/2022   MPG 96.8 05/06/2018   Lab Results  Component Value Date   PROLACTIN 6.1 12/19/2014   PROLACTIN 1.3 (L) 11/30/2014   Lab Results  Component Value Date   CHOL 172 04/30/2022   TRIG 39 04/30/2022   HDL 37 (L) 04/30/2022   CHOLHDL 4.6 04/30/2022   VLDL 8 04/30/2022   LDLCALC 127 (H) 04/30/2022   LDLCALC 108 (H) 05/06/2018    Physical Findings: AIMS:  , ,  ,  ,    CIWA:    COWS:     Musculoskeletal: Strength & Muscle Tone: within normal limits Gait & Station: normal Patient leans: N/A  Psychiatric Specialty Exam:  Presentation  Mood and Affect  Mood is alright.  Affect neutral  Thought Process  Thought process are fairly linear History of Schizophrenia/Schizoaffective disorder:Yes  Duration of Psychotic Symptoms:Greater than six months  Patient endorses paranoia No suicidal thoughts  Sensorium  Judgment and insight are fair  Executive  Psychomotor Activity  Psychomotor Activity: No retardation   Physical Exam: Physical Exam Vitals and nursing note reviewed.  Constitutional:      Appearance: Normal appearance.  HENT:     Head: Normocephalic and atraumatic.     Mouth/Throat:     Pharynx: Oropharynx is clear.  Eyes:     Pupils: Pupils are equal, round, and reactive to light.  Cardiovascular:     Rate and Rhythm: Normal rate and regular rhythm.  Pulmonary:     Effort: Pulmonary effort is normal.     Breath sounds: Normal breath sounds.  Abdominal:     General: Abdomen is flat.     Palpations: Abdomen is soft.  Musculoskeletal:         General: Normal range of motion.  Skin:    General: Skin is warm and dry.  Neurological:     General: No focal deficit present.     Mental Status: He is alert. Mental status is at baseline.  Psychiatric:        Attention and Perception: Attention normal.        Mood and Affect: Mood normal.        Speech: Speech normal.        Behavior: Behavior normal.        Thought Content: Thought content normal.        Cognition and Memory: Cognition normal.        Judgment: Judgment normal.    Review of Systems  Constitutional: Negative.   HENT: Negative.    Eyes: Negative.   Respiratory: Negative.    Cardiovascular: Negative.   Gastrointestinal: Negative.   Musculoskeletal: Negative.   Skin: Negative.   Neurological: Negative.   Psychiatric/Behavioral: Negative.     Blood pressure 121/77, pulse 81, temperature 98 F (36.7 C), temperature source Oral, resp. rate 18, height 6' (1.829 m), weight 87.1 kg, SpO2 100 %. Body mass index is 26.04 kg/m.   Treatment Plan Summary: Medication management and Plan talked with patient about plan to continue titrating back his clozapine.  It was not all that long ago that he was on the 350 and he is  tolerating a rapid titration so I think we can go ahead and go up to 250 tonight.  I am hoping that with stabilization we can anticipate probable discharge by Monday.  11/18 Increase Clozaril to 300 mg nightly.  11/19 Increase Clozaril to 350 mg nightly. ANC on 11/13 was 4.7.  [id number] Reggie PileAnand Romualdo Prosise, MD 05/04/2022, 9:04 AM

## 2022-05-05 DIAGNOSIS — F203 Undifferentiated schizophrenia: Secondary | ICD-10-CM | POA: Diagnosis not present

## 2022-05-05 MED ORDER — PROPRANOLOL HCL 20 MG PO TABS: 20.0000 mg | ORAL_TABLET | Freq: Two times a day (BID) | ORAL | 1 refills | Status: DC

## 2022-05-05 MED ORDER — CLOZAPINE 50 MG PO TABS: 350.0000 mg | ORAL_TABLET | Freq: Every day | ORAL | 0 refills | Status: AC

## 2022-05-05 MED ORDER — BENZTROPINE MESYLATE 0.5 MG PO TABS: 1.0000 mg | ORAL_TABLET | Freq: Two times a day (BID) | ORAL | 1 refills | Status: AC

## 2022-05-05 MED ORDER — CETIRIZINE HCL 10 MG PO TABS: 10.0000 mg | ORAL_TABLET | Freq: Every day | ORAL | 1 refills | Status: DC

## 2022-05-05 MED ORDER — BUPROPION HCL ER (XL) 150 MG PO TB24: 150.0000 mg | ORAL_TABLET | Freq: Every day | ORAL | 1 refills | Status: AC

## 2022-05-05 MED ORDER — FLUTICASONE PROPIONATE 50 MCG/ACT NA SUSP: 2.0000 | Freq: Every day | NASAL | 1 refills | Status: DC | PRN

## 2022-05-05 MED ORDER — MONTELUKAST SODIUM 10 MG PO TABS: 10.0000 mg | ORAL_TABLET | Freq: Every day | ORAL | 1 refills | Status: DC

## 2022-05-05 MED ORDER — ALBUTEROL SULFATE HFA 108 (90 BASE) MCG/ACT IN AERS: 1.0000 | INHALATION_SPRAY | Freq: Four times a day (QID) | RESPIRATORY_TRACT | 1 refills | Status: AC | PRN

## 2022-05-05 MED ORDER — VITAMIN D (ERGOCALCIFEROL) 1.25 MG (50000 UNIT) PO CAPS: 50000.0000 [IU] | ORAL_CAPSULE | ORAL | 1 refills | Status: DC

## 2022-05-05 MED ORDER — CLONAZEPAM 0.5 MG PO TABS: 0.5000 mg | ORAL_TABLET | Freq: Two times a day (BID) | ORAL | 1 refills | Status: AC

## 2022-05-05 MED ORDER — MELATONIN 5 MG PO TABS: 5.0000 mg | ORAL_TABLET | Freq: Every day | ORAL | 1 refills | Status: DC

## 2022-05-05 MED ORDER — DOCUSATE SODIUM 100 MG PO CAPS: 100.0000 mg | ORAL_CAPSULE | Freq: Two times a day (BID) | ORAL | 1 refills | Status: DC

## 2022-05-05 MED ORDER — HYDROXYZINE HCL 25 MG PO TABS: 25.0000 mg | ORAL_TABLET | Freq: Three times a day (TID) | ORAL | 1 refills | Status: DC | PRN

## 2022-05-05 MED ORDER — NICOTINE 21 MG/24HR TD PT24: 21.0000 mg | MEDICATED_PATCH | Freq: Every day | TRANSDERMAL | 1 refills | Status: AC

## 2022-05-05 NOTE — Progress Notes (Signed)
Recreation Therapy Notes  Date: 05/05/2022  Time: 10:40 am     Location: Craft room   Behavioral response: N/A   Intervention Topic: Stress Management    Discussion/Intervention: Patient refused to attend group.   Clinical Observations/Feedback:  Patient refused to attend group.    Ovidio Steele LRT/CTRS        Euleta Belson 05/05/2022 12:51 PM

## 2022-05-05 NOTE — Progress Notes (Signed)
Patient calm and pleasant during assessment denying SI/HI/AVH. Pt observed interacting appropriately with staff and peers on the unit. Pt compliant with medication administration per MD orders. Pt given education, support, and encouragement to be active in his treatment plan. Pt being monitored Q 15 minutes for safety per unit protocol, remains safe on the unit  

## 2022-05-05 NOTE — Group Note (Signed)
LCSW Group Therapy Note   Group Date: 05/05/2022 Start Time: 1300 End Time: 1400   Type of Therapy and Topic:  Group Therapy: Boundaries  Participation Level:  Active  Description of Group: This group will address the use of boundaries in their personal lives. Patients will explore why boundaries are important, the difference between healthy and unhealthy boundaries, and negative and postive outcomes of different boundaries and will look at how boundaries can be crossed.  Patients will be encouraged to identify current boundaries in their own lives and identify what kind of boundary is being set. Facilitators will guide patients in utilizing problem-solving interventions to address and correct types boundaries being used and to address when no boundary is being used. Understanding and applying boundaries will be explored and addressed for obtaining and maintaining a balanced life. Patients will be encouraged to explore ways to assertively make their boundaries and needs known to significant others in their lives, using other group members and facilitator for role play, support, and feedback.  Therapeutic Goals:  1.  Patient will identify areas in their life where setting clear boundaries could be  used to improve their life.  2.  Patient will identify signs/triggers that a boundary is not being respected. 3.  Patient will identify two ways to set boundaries in order to achieve balance in  their lives: 4.  Patient will demonstrate ability to communicate their needs and set boundaries  through discussion and/or role plays  Summary of Patient Progress:  Patient was present for the entirety of the group session. Patient was an active listener and participated in the topic of discussion, provided helpful advice to others, and added nuance to topic of conversation. Patient was often circumstantial, though eager to participate and well intended.   Therapeutic Modalities:   Cognitive Behavioral  Therapy Solution-Focused Therapy  Almedia Balls 05/05/2022  2:58 PM

## 2022-05-05 NOTE — Progress Notes (Signed)
Patient alert and oriented x 4, affect is flat thoughts are organized and coherent, but appears responding to internal stimuli, staring into the air and when approached by the writer he stated " l am ok" patient was offered emotional support and encouragement. 15 minutes safety checks maintained will continue to monitor.

## 2022-05-05 NOTE — Progress Notes (Signed)
[facility] MD Progress Note  05/05/2022 2:01 PM Anthony Luna  MRN:  [id number] Subjective: Patient seen and chart reviewed.  Patient has no new complaints.  He reports that he is feeling much better.  He admits to occasional hallucinations but says they do not bother him and they are hardly happening at all and just barely noticeable.  His mood is stable and he denies any suicidal or homicidal thoughts.  He is neatly dressed and taking care of his ADLs and shows good insight about staying on his medicine. Principal Problem: Schizophrenia (HCC) Diagnosis: Principal Problem:   Schizophrenia (HCC)  Total Time spent with patient: 30 minutes  Past Psychiatric History: Past history of longstanding schizophrenia  Past Medical History:  Past Medical History:  Diagnosis Date   Depression    Since moving into new apartment 1 year ago   None to low serum cortisol response with adrenocorticotrophic hormone (ACTH) stimulation test    Schizophrenia, paranoid type (HCC)    History reviewed. No pertinent surgical history. Family History: History reviewed. No pertinent family history. Family Psychiatric  History: See previous Social History:  Social History   Substance and Sexual Activity  Alcohol Use Yes   Alcohol/week: 0.0 standard drinks of alcohol   Comment: "Special occasions"     Social History   Substance and Sexual Activity  Drug Use Yes   Frequency: 4.0 times per week   Types: Marijuana   Comment: "2 blunts"    Social History   Socioeconomic History   Marital status: Single    Spouse name: Not on file   Number of children: Not on file   Years of education: Not on file   Highest education level: Not on file  Occupational History   Not on file  Tobacco Use   Smoking status: Every Day    Packs/day: 0.50    Types: Cigarettes    Start date: 11/29/2002   Smokeless tobacco: Former   Tobacco comments:    does not want to quit  Vaping Use   Vaping Use: Never used  Substance  and Sexual Activity   Alcohol use: Yes    Alcohol/week: 0.0 standard drinks of alcohol    Comment: "Special occasions"   Drug use: Yes    Frequency: 4.0 times per week    Types: Marijuana    Comment: "2 blunts"   Sexual activity: Not on file  Other Topics Concern   Not on file  Social History Narrative   Not on file   Social Determinants of Health   Financial Resource Strain: Not on file  Food Insecurity: No Food Insecurity (04/28/2022)   Hunger Vital Sign    Worried About Running Out of Food in the Last Year: Never true    Ran Out of Food in the Last Year: Never true  Transportation Needs: No Transportation Needs (04/28/2022)   PRAPARE - Administrator, Civil Service (Medical): No    Lack of Transportation (Non-Medical): No  Physical Activity: Not on file  Stress: Not on file  Social Connections: Not on file   Additional Social History:                         Sleep: Fair  Appetite:  Fair  Current Medications: Current Facility-Administered Medications  Medication Dose Route Frequency Provider Last Rate Last Admin   acetaminophen (TYLENOL) tablet 650 mg  650 mg Oral Q6H PRN Jearld Lesch, NP  albuterol (VENTOLIN HFA) 108 (90 Base) MCG/ACT inhaler 1-2 puff  1-2 puff Inhalation Q6H PRN Kennisha Qin, [name], MD       alum & mag hydroxide-simeth (MAALOX/MYLANTA) 200-200-20 MG/5ML suspension 30 mL  30 mL Oral Q4H PRN Dixon, Rashaun M, NP       benztropine (COGENTIN) tablet 1 mg  1 mg Oral BID Dawon Troop T, MD   1 mg at 05/05/22 4431   buPROPion (WELLBUTRIN XL) 24 hr tablet 150 mg  150 mg Oral Daily Dixon, Rashaun M, NP   150 mg at 05/05/22 5400   clonazePAM (KLONOPIN) tablet 0.5 mg  0.5 mg Oral BID Xeng Kucher T, MD   0.5 mg at 05/05/22 8676   cloZAPine (CLOZARIL) tablet 350 mg  350 mg Oral QHS Reggie Pile, MD   350 mg at 05/04/22 2101   docusate sodium (COLACE) capsule 100 mg  100 mg Oral BID Parv Manthey T, MD   100 mg at 05/05/22 0807    fluticasone (FLONASE) 50 MCG/ACT nasal spray 2 spray  2 spray Each Nare Daily PRN Benjamim Harnish, [name], MD       hydrOXYzine (ATARAX) tablet 25 mg  25 mg Oral TID PRN Jearld Lesch, NP   25 mg at 05/01/22 2121   loratadine (CLARITIN) tablet 10 mg  10 mg Oral Daily Muslima Toppins T, MD   10 mg at 05/05/22 1950   magnesium hydroxide (MILK OF MAGNESIA) suspension 30 mL  30 mL Oral Daily PRN Jearld Lesch, NP       melatonin tablet 5 mg  5 mg Oral QHS Dixon, Rashaun M, NP   5 mg at 05/04/22 2101   montelukast (SINGULAIR) tablet 10 mg  10 mg Oral QHS Kaytelynn Scripter T, MD   10 mg at 05/04/22 2102   nicotine (NICODERM CQ - dosed in mg/24 hours) patch 21 mg  21 mg Transdermal Daily Reggie Pile, MD   21 mg at 05/05/22 9326   propranolol (INDERAL) tablet 20 mg  20 mg Oral BID Breslin Hemann, [name], MD   20 mg at 05/05/22 7124    Lab Results: No results found for this or any previous visit (from the past 48 hour(s)).  Blood Alcohol level:  Lab Results  Component Value Date   ETH <10 04/27/2022   ETH <10 12/13/2018    Metabolic Disorder Labs: Lab Results  Component Value Date   HGBA1C 5.1 04/30/2022   MPG 99.67 04/30/2022   MPG 96.8 05/06/2018   Lab Results  Component Value Date   PROLACTIN 6.1 12/19/2014   PROLACTIN 1.3 (L) 11/30/2014   Lab Results  Component Value Date   CHOL 172 04/30/2022   TRIG 39 04/30/2022   HDL 37 (L) 04/30/2022   CHOLHDL 4.6 04/30/2022   VLDL 8 04/30/2022   LDLCALC 127 (H) 04/30/2022   LDLCALC 108 (H) 05/06/2018    Physical Findings: AIMS: Facial and Oral Movements Muscles of Facial Expression: None, normal Lips and Perioral Area: None, normal Jaw: None, normal Tongue: None, normal,Extremity Movements Upper (arms, wrists, hands, fingers): None, normal Lower (legs, knees, ankles, toes): None, normal, Trunk Movements Neck, shoulders, hips: None, normal, Overall Severity Severity of abnormal movements (highest score from questions above): None,  normal Incapacitation due to abnormal movements: None, normal Patient's awareness of abnormal movements (rate only patient's report): No Awareness, Dental Status Current problems with teeth and/or dentures?: No Does patient usually wear dentures?: No  CIWA:    COWS:  Musculoskeletal: Strength & Muscle Tone: within normal limits Gait & Station: normal Patient leans: N/A  Psychiatric Specialty Exam:  Presentation  General Appearance: No data recorded Eye Contact:No data recorded Speech:No data recorded Speech Volume:No data recorded Handedness:No data recorded  Mood and Affect  Mood:No data recorded Affect:No data recorded  Thought Process  Thought Processes:No data recorded Descriptions of Associations:No data recorded Orientation:No data recorded Thought Content:No data recorded History of Schizophrenia/Schizoaffective disorder:Yes  Duration of Psychotic Symptoms:Greater than six months  Hallucinations:No data recorded Ideas of Reference:No data recorded Suicidal Thoughts:No data recorded Homicidal Thoughts:No data recorded  Sensorium  Memory:No data recorded Judgment:No data recorded Insight:No data recorded  Executive Functions  Concentration:No data recorded Attention Span:No data recorded Recall:No data recorded Fund of Knowledge:No data recorded Language:No data recorded  Psychomotor Activity  Psychomotor Activity:No data recorded  Assets  Assets:No data recorded  Sleep  Sleep:No data recorded   Physical Exam: Physical Exam Vitals reviewed.  Constitutional:      Appearance: Normal appearance.  HENT:     Head: Normocephalic and atraumatic.     Mouth/Throat:     Pharynx: Oropharynx is clear.  Eyes:     Pupils: Pupils are equal, round, and reactive to light.  Cardiovascular:     Rate and Rhythm: Normal rate and regular rhythm.  Pulmonary:     Effort: Pulmonary effort is normal.     Breath sounds: Normal breath sounds.  Abdominal:      General: Abdomen is flat.     Palpations: Abdomen is soft.  Musculoskeletal:        General: Normal range of motion.  Skin:    General: Skin is warm and dry.  Neurological:     General: No focal deficit present.     Mental Status: He is alert. Mental status is at baseline.  Psychiatric:        Mood and Affect: Mood normal.        Thought Content: Thought content normal.    Review of Systems  Constitutional: Negative.   HENT: Negative.    Eyes: Negative.   Respiratory: Negative.    Cardiovascular: Negative.   Gastrointestinal: Negative.   Musculoskeletal: Negative.   Skin: Negative.   Neurological: Negative.   Psychiatric/Behavioral: Negative.     Blood pressure 128/78, pulse 91, temperature 97.7 F (36.5 C), temperature source Oral, resp. rate 18, height 6' (1.829 m), weight 87.1 kg, SpO2 100 %. Body mass index is 26.04 kg/m.   Treatment Plan Summary: Medication management and Plan I spoke with the manager of his group home.  He has an appointment to see a new psychiatrist tomorrow at 2 PM so we will aim to get him discharged relatively early in the morning probably before 10:00.  I will start making preparations with his medicines.  No indication we would change anything before discharge  Mordecai Rasmussen, MD 05/05/2022, 2:01 PM

## 2022-05-05 NOTE — BHH Suicide Risk Assessment (Signed)
[facility] Discharge Suicide Risk Assessment   Principal Problem: Schizophrenia [facility]) Discharge Diagnoses: Principal Problem:   Schizophrenia (HCC)   Total Time spent with patient: 30 minutes  Musculoskeletal: Strength & Muscle Tone: within normal limits Gait & Station: normal Patient leans: N/A  Psychiatric Specialty Exam  Presentation  General Appearance: No data recorded Eye Contact:No data recorded Speech:No data recorded Speech Volume:No data recorded Handedness:No data recorded  Mood and Affect  Mood:No data recorded Duration of Depression Symptoms: Greater than two weeks  Affect:No data recorded  Thought Process  Thought Processes:No data recorded Descriptions of Associations:No data recorded Orientation:No data recorded Thought Content:No data recorded History of Schizophrenia/Schizoaffective disorder:Yes  Duration of Psychotic Symptoms:Greater than six months  Hallucinations:No data recorded Ideas of Reference:No data recorded Suicidal Thoughts:No data recorded Homicidal Thoughts:No data recorded  Sensorium  Memory:No data recorded Judgment:No data recorded Insight:No data recorded  Executive Functions  Concentration:No data recorded Attention Span:No data recorded Recall:No data recorded Fund of Knowledge:No data recorded Language:No data recorded  Psychomotor Activity  Psychomotor Activity:No data recorded  Assets  Assets:No data recorded  Sleep  Sleep:No data recorded  Physical Exam: Physical Exam Vitals and nursing note reviewed.  Constitutional:      Appearance: Normal appearance.  HENT:     Head: Normocephalic and atraumatic.     Mouth/Throat:     Pharynx: Oropharynx is clear.  Eyes:     Pupils: Pupils are equal, round, and reactive to light.  Cardiovascular:     Rate and Rhythm: Normal rate and regular rhythm.  Pulmonary:     Effort: Pulmonary effort is normal.     Breath sounds: Normal breath sounds.  Abdominal:     General:  Abdomen is flat.     Palpations: Abdomen is soft.  Musculoskeletal:        General: Normal range of motion.  Skin:    General: Skin is warm and dry.  Neurological:     General: No focal deficit present.     Mental Status: He is alert. Mental status is at baseline.  Psychiatric:        Attention and Perception: Attention normal.        Mood and Affect: Mood normal.        Speech: Speech normal.        Behavior: Behavior normal.        Thought Content: Thought content normal.        Cognition and Memory: Cognition normal.        Judgment: Judgment normal.    Review of Systems  Constitutional: Negative.   HENT: Negative.    Eyes: Negative.   Respiratory: Negative.    Cardiovascular: Negative.   Gastrointestinal: Negative.   Musculoskeletal: Negative.   Skin: Negative.   Neurological: Negative.   Psychiatric/Behavioral: Negative.     Blood pressure 128/78, pulse 91, temperature 97.7 F (36.5 C), temperature source Oral, resp. rate 18, height 6' (1.829 m), weight 87.1 kg, SpO2 100 %. Body mass index is 26.04 kg/m.  Mental Status Per Nursing Assessment::   On Admission:  NA  Demographic Factors:  Male  Loss Factors: NA  Historical Factors: Impulsivity  Risk Reduction Factors:   Living with another person, especially a relative, Positive social support, and Positive therapeutic relationship  Continued Clinical Symptoms:  Schizophrenia:   Less than 49 years old  Cognitive Features That Contribute To Risk:  None    Suicide Risk:  Minimal: No identifiable suicidal ideation.  Patients presenting with no  risk factors but with morbid ruminations; may be classified as minimal risk based on the severity of the depressive symptoms    Plan Of Care/Follow-up recommendations:  Other:  Patient has stabilized on medication.  No acute behavior problems.  Upbeat affect with no reports of any suicidal or homicidal thought.  Agreeable to continuing medicine.  Has a safe place to  live.  Plan for discharge on Tuesday  Mordecai Rasmussen, MD 05/05/2022, 2:10 PM

## 2022-05-05 NOTE — BH IP Treatment Plan (Signed)
Interdisciplinary Treatment and Diagnostic Plan Update  05/05/2022 Time of Session: 0830 Anthony Luna MRN: [id number]  Principal Diagnosis: Schizophrenia [facility])  Secondary Diagnoses: Principal Problem:   Schizophrenia (HCC)   Current Medications:  Current Facility-Administered Medications  Medication Dose Route Frequency Provider Last Rate Last Admin   acetaminophen (TYLENOL) tablet 650 mg  650 mg Oral Q6H PRN Dixon, Rashaun M, NP       albuterol (VENTOLIN HFA) 108 (90 Base) MCG/ACT inhaler 1-2 puff  1-2 puff Inhalation Q6H PRN [name], [name], MD       alum & mag hydroxide-simeth (MAALOX/MYLANTA) 200-200-20 MG/5ML suspension 30 mL  30 mL Oral Q4H PRN Dixon, Rashaun M, NP       benztropine (COGENTIN) tablet 1 mg  1 mg Oral BID [name], [name], MD   1 mg at 05/05/22 5462   buPROPion (WELLBUTRIN XL) 24 hr tablet 150 mg  150 mg Oral Daily Dixon, Rashaun M, NP   150 mg at 05/05/22 7035   clonazePAM (KLONOPIN) tablet 0.5 mg  0.5 mg Oral BID [name], [name], MD   0.5 mg at 05/05/22 0093   cloZAPine (CLOZARIL) tablet 350 mg  350 mg Oral QHS Reggie Pile, MD   350 mg at 05/04/22 2101   docusate sodium (COLACE) capsule 100 mg  100 mg Oral BID [name], [name], MD   100 mg at 05/05/22 0807   fluticasone (FLONASE) 50 MCG/ACT nasal spray 2 spray  2 spray Each Nare Daily PRN [name], [name], MD       hydrOXYzine (ATARAX) tablet 25 mg  25 mg Oral TID PRN Jearld Lesch, NP   25 mg at 05/01/22 2121   loratadine (CLARITIN) tablet 10 mg  10 mg Oral Daily [name], [name], MD   10 mg at 05/05/22 8182   magnesium hydroxide (MILK OF MAGNESIA) suspension 30 mL  30 mL Oral Daily PRN Jearld Lesch, NP       melatonin tablet 5 mg  5 mg Oral QHS Dixon, Rashaun M, NP   5 mg at 05/04/22 2101   montelukast (SINGULAIR) tablet 10 mg  10 mg Oral QHS [name], [name], MD   10 mg at 05/04/22 2102   nicotine (NICODERM CQ - dosed in mg/24 hours) patch 21 mg  21 mg Transdermal Daily Reggie Pile, MD   21 mg at  05/05/22 9937   propranolol (INDERAL) tablet 20 mg  20 mg Oral BID [name], [name], MD   20 mg at 05/05/22 0807   PTA Medications: Medications Prior to Admission  Medication Sig Dispense Refill Last Dose   albuterol (VENTOLIN HFA) 108 (90 Base) MCG/ACT inhaler Inhale 1-2 puffs into the lungs every 4 (four) hours as needed for wheezing or shortness of breath.      benztropine (COGENTIN) 0.5 MG tablet Take 1 mg by mouth 2 (two) times daily.      buPROPion (WELLBUTRIN XL) 150 MG 24 hr tablet Take 1 tablet (150 mg total) by mouth daily. 30 tablet 1    cetirizine (ZYRTEC) 10 MG tablet Take 10 mg by mouth daily.      clonazePAM (KLONOPIN) 0.5 MG tablet Take 0.5 mg by mouth 2 (two) times daily as needed for anxiety.      cloZAPine (CLOZARIL) 100 MG tablet Take 350 mg by mouth at bedtime.      docusate sodium (COLACE) 100 MG capsule Take 100 mg by mouth 2 (two) times daily.      fluticasone (  FLONASE) 50 MCG/ACT nasal spray Place 2 sprays into both nostrils daily.      Melatonin 5 MG TABS Take 5 mg by mouth at bedtime.      Paliperidone Palmitate (INVEGA TRINZA) 819 MG/2.625ML SUSP Inject 819 mg into the muscle.      propranolol (INDERAL) 10 MG tablet Take 1 tablet (10 mg total) by mouth 3 (three) times daily. (Patient taking differently: Take 20 mg by mouth 2 (two) times daily.) 90 tablet 0    Vitamin D, Ergocalciferol, (DRISDOL) 1.25 MG (50000 UT) CAPS capsule Take 50,000 Units by mouth every 7 (seven) days. (Patient not taking: Reported on 04/28/2022)       Patient Stressors: Medication change or noncompliance    Patient Strengths: Motivation for treatment/growth   Treatment Modalities: Medication Management, Group therapy, Case management,  1 to 1 session with clinician, Psychoeducation, Recreational therapy.   Physician Treatment Plan for Primary Diagnosis: Schizophrenia (HCC) Long Term Goal(s): Improvement in symptoms so as ready for discharge   Short Term Goals: Ability to identify and  develop effective coping behaviors will improve Ability to maintain clinical measurements within normal limits will improve Compliance with prescribed medications will improve Ability to verbalize feelings will improve Ability to demonstrate self-control will improve  Medication Management: Evaluate patient's response, side effects, and tolerance of medication regimen.  Therapeutic Interventions: 1 to 1 sessions, Unit Group sessions and Medication administration.  Evaluation of Outcomes: Progressing  Physician Treatment Plan for Secondary Diagnosis: Principal Problem:   Schizophrenia (HCC)  Long Term Goal(s): Improvement in symptoms so as ready for discharge   Short Term Goals: Ability to identify and develop effective coping behaviors will improve Ability to maintain clinical measurements within normal limits will improve Compliance with prescribed medications will improve Ability to verbalize feelings will improve Ability to demonstrate self-control will improve     Medication Management: Evaluate patient's response, side effects, and tolerance of medication regimen.  Therapeutic Interventions: 1 to 1 sessions, Unit Group sessions and Medication administration.  Evaluation of Outcomes: Progressing   RN Treatment Plan for Primary Diagnosis: Schizophrenia (HCC) Long Term Goal(s): Knowledge of disease and therapeutic regimen to maintain health will improve  Short Term Goals: Ability to remain free from injury will improve, Ability to verbalize frustration and anger appropriately will improve, Ability to demonstrate self-control, Ability to participate in decision making will improve, Ability to verbalize feelings will improve, Ability to disclose and discuss suicidal ideas, Ability to identify and develop effective coping behaviors will improve, and Compliance with prescribed medications will improve  Medication Management: RN will administer medications as ordered by provider, will  assess and evaluate patient's response and provide education to patient for prescribed medication. RN will report any adverse and/or side effects to prescribing provider.  Therapeutic Interventions: 1 on 1 counseling sessions, Psychoeducation, Medication administration, Evaluate responses to treatment, Monitor vital signs and CBGs as ordered, Perform/monitor CIWA, COWS, AIMS and Fall Risk screenings as ordered, Perform wound care treatments as ordered.  Evaluation of Outcomes: Progressing   LCSW Treatment Plan for Primary Diagnosis: Schizophrenia (HCC) Long Term Goal(s): Safe transition to appropriate next level of care at discharge, Engage patient in therapeutic group addressing interpersonal concerns.  Short Term Goals: Engage patient in aftercare planning with referrals and resources, Increase social support, Increase ability to appropriately verbalize feelings, Increase emotional regulation, Facilitate acceptance of mental health diagnosis and concerns, Facilitate patient progression through stages of change regarding substance use diagnoses and concerns, Identify triggers associated with mental health/substance abuse  issues, and Increase skills for wellness and recovery  Therapeutic Interventions: Assess for all discharge needs, 1 to 1 time with Social worker, Explore available resources and support systems, Assess for adequacy in community support network, Educate family and significant other(s) on suicide prevention, Complete Psychosocial Assessment, Interpersonal group therapy.  Evaluation of Outcomes: Progressing   Progress in Treatment: Attending groups: Yes. Participating in groups: Yes. Taking medication as prescribed: Yes. Toleration medication: Yes. Family/Significant other contact made: No, will contact:  CSW will obtain consent to reach family/friend.  Patient understands diagnosis: Yes. Discussing patient identified problems/goals with staff: Yes. Medical problems stabilized  or resolved: Yes. Denies suicidal/homicidal ideation: No. Issues/concerns per patient self-inventory: Yes. Other: none  New problem(s) identified: No, Describe:  none  New Short Term/Long Term Goal(s): Patient to work towards elimination of symptoms of psychosis, medication management for mood stabilization; development of comprehensive mental wellness plan.  Patient Goals: No additional goals identified at this time. Patient to continue to work towards original goals identified in initial treatment team meeting. CSW will remain available to patient should they voice additional treatment goals.   Discharge Plan or Barriers: No psychosocial barriers identified at this time, patient to return to place of residence when appropriate for discharge.   Reason for Continuation of Hospitalization: Medication stabilization Other; describe psychosis   Estimated Length of Stay: 1-7 days   Scribe for Treatment Team: Almedia Balls 05/05/2022 8:58 AM

## 2022-05-05 NOTE — Progress Notes (Signed)
Pt denies SI/HI/AVH and verbally agrees to approach staff if these become apparent or before harming themselves/others. Rates depression 0/10. Rates anxiety 0/10. Rates pain 0/10. Pt has been out of his room for most of the day and engaging in activities. Pt has been sleepy at times during the day. Pt stated that he has had a good day and that he is excited to leave tomorrow so he can spend thanksgiving with his group home family. Scheduled medications administered to pt, per MD orders. RN provided support and encouragement to pt. Q15 min safety checks implemented and continued. Pt safe on the unit. RN will continue to monitor and intervene as needed. Problem: Coping: Goal: Level of anxiety will decrease Outcome: Progressing   Problem: Education: Goal: Emotional status will improve Outcome: Progressing    05/05/22 0807  Psych Admission Type (Psych Patients Only)  Admission Status Involuntary  Psychosocial Assessment  Patient Complaints None  Eye Contact Staring;Suspiciousness  Facial Expression Blank;Pensive  Affect Flat;Blunted  Speech Logical/coherent;Slow  Interaction Minimal;Hypervigilant  Motor Activity Pacing;Slow  Appearance/Hygiene Unremarkable  Behavior Characteristics Cooperative;Pacing  Mood Pleasant  Thought Process  Coherency Circumstantial  Content Preoccupation  Delusions None reported or observed  Perception Hallucinations  Hallucination Auditory  Judgment Limited  Confusion None  Danger to Self  Current suicidal ideation? Denies  Danger to Others  Danger to Others None reported or observed

## 2022-05-05 NOTE — [facility] Suicide Risk Assessment (Signed)
B[facility] INPATIENT:  Family/Significant Other Suicide Prevention Education  Suicide Prevention Education:  Education Completed; Adriana Reams, [phone], group home owner has been identified by the patient as the family member/significant other with whom the patient will be residing, and identified as the person(s) who will aid the patient in the event of a mental health crisis (suicidal ideations/suicide attempt).  With written consent from the patient, the family member/significant other has been provided the following suicide prevention education, prior to the and/or following the discharge of the patient.  The suicide prevention education provided includes the following: Suicide risk factors Suicide prevention and interventions National Suicide Hotline telephone number [facility] assessment telephone number [facility] Emergency Assistance 911 [facility] and/or Residential Mobile Crisis Unit telephone number  Request made of family/significant other to: Remove weapons (e.g., guns, rifles, knives), all items previously/currently identified as safety concern.   Remove drugs/medications (over-the-counter, prescriptions, illicit drugs), all items previously/currently identified as a safety concern.  The family member/significant other verbalizes understanding of the suicide prevention education information provided.  The family member/significant other agrees to remove the items of safety concern listed above.  Harden Mo 05/05/2022, 3:51 PM

## 2022-05-06 DIAGNOSIS — F203 Undifferentiated schizophrenia: Secondary | ICD-10-CM

## 2022-05-06 NOTE — BHH Counselor (Signed)
CSW contacted [employer] [phone]) regarding transportation. [name] was informed that pt was ready for discharge. [name] and CSW discussed process for letting medical mall information desk know that she is here to pick him up. No other concerns expressed. Contact ended without incident.   Vilma Meckel. Algis Greenhouse, MSW, LCSW, LCAS 05/06/2022 10:39 AM

## 2022-05-06 NOTE — Progress Notes (Signed)
Patient ID: Anthony Luna, male   DOB: 12-May-1986, 36 y.o.   MRN: [id number]  Patient was discharged from the Behavioral Medicine unit at approx 1130 escorted by staff. Patient denies SI/HI/AVH. Discharge packet to include printed AVS, Suicide Risk Assessment, and Transition Record. The packet was reviewed with patient. Belongings returned and patient verified receipt with signature. Suicide safety plan completed with a copy kept in chart and copy given to patient.

## 2022-05-06 NOTE — Progress Notes (Signed)
  [facility] Adult Case Management Discharge Plan :  Will you be returning to the same living situation after discharge:  Yes,  pt to return to group home. At discharge, do you have transportation home?: Yes,  group home providing transportation back. Do you have the ability to pay for your medications: Yes,  Medicaid [place] Access.  Release of information consent forms completed and in the chart;  Patient's signature needed at discharge.  Patient to Follow up at:  Follow-up Information     [facility].   Why: Appointment is scheduled for 05/06/2022 at 12:00PM. Contact information: [address] [place] [address] [phone]                 Next level of care provider has access to [facility] Link:no  Safety Planning and Suicide Prevention discussed: Yes,  SPE completed with group home owner, [name].     Has patient been referred to the Quitline?: Patient refused referral  Patient has been referred for addiction treatment: Pt. refused referral  Glenis Smoker, LCSW 05/06/2022, 9:09 AM

## 2022-05-06 NOTE — Progress Notes (Signed)
Recreation Therapy Notes    Date: 05/06/2022   Time: 10:50 am      Location: Craft room    Behavioral response: N/A   Intervention Topic: Values  Discussion/Intervention: Patient refused to attend group.    Clinical Observations/Feedback:  Patient refused to attend group.    [name] LRT/CTRS        Anthony Luna 05/06/2022 11:31 AM 

## 2022-05-06 NOTE — Progress Notes (Signed)
Recreation Therapy Notes  INPATIENT RECREATION TR PLAN  Patient Details Name: Diamante Rubin MRN: [id number] DOB: 11-Jun-1986 Today's Date: 05/06/2022  Rec Therapy Plan Is patient appropriate for Therapeutic Recreation?: Yes Treatment times per week: at least 3 Estimated Length of Stay: 5-7 days TR Treatment/Interventions: Group participation (Comment)  Discharge Criteria Pt will be discharged from therapy if:: Discharged Treatment plan/goals/alternatives discussed and agreed upon by:: Patient/family  Discharge Summary Short term goals set: Patient will successfully identify 2 ways of making healthy decisions post d/c within 5 recreation therapy group sessions Short term goals met: Adequate for discharge Progress toward goals comments: Groups attended Which groups?: Self-esteem, Coping skills Reason goals not met: N/A Therapeutic equipment acquired: N/A Reason patient discharged from therapy: Discharge from hospital Pt/family agrees with progress & goals achieved: Yes Date patient discharged from therapy: 05/06/22   Bonnita Newby 05/06/2022, 11:42 AM

## 2022-05-06 NOTE — Plan of Care (Signed)
  Problem: Decision Making Goal: STG - Patient will successfully identify 2 ways of making healthy decisions post d/c within 5 recreation therapy group sessions Description: STG - Patient will successfully identify 2 ways of making healthy decisions post d/c within 5 recreation therapy group sessions 05/06/2022 1140 by Alveria Apley, LRT Outcome: Adequate for Discharge 05/06/2022 1140 by Alveria Apley, LRT Outcome: Adequate for Discharge

## 2022-05-06 NOTE — Discharge Summary (Signed)
Physician Discharge Summary Note  Patient:  Anthony Luna is an 36 y.o., male MRN:  [id number] DOB:  [birth year] Patient phone:  [phone] (home)  Patient address:   [address] [place] [address],  Total Time spent with patient: 30 minutes  Date of Admission:  04/28/2022 Date of Discharge: 05/06/2022  Reason for Admission: Patient was admitted because of return of worsening paranoia hallucinations and psychotic symptoms in the context of having been off his medicine for several days.  Principal Problem: Schizophrenia [facility]) Discharge Diagnoses: Principal Problem:   Schizophrenia [facility])   Past Psychiatric History: Long history of schizophrenia previously well stabilized on medication.  Recent changes in provider resulted in missing medicines for a week or so and a return of symptoms.  Some agitation and aggression when psychotic in the past.  Past Medical History:  Past Medical History:  Diagnosis Date   Depression    Since moving into new apartment 1 year ago   None to low serum cortisol response with adrenocorticotrophic hormone (ACTH) stimulation test    Schizophrenia, paranoid type (HCC)    History reviewed. No pertinent surgical history. Family History: History reviewed. No pertinent family history. Family Psychiatric  History: See previous.  Nothing pertinent mentioned at this point Social History:  Social History   Substance and Sexual Activity  Alcohol Use Yes   Alcohol/week: 0.0 standard drinks of alcohol   Comment: "Special occasions"     Social History   Substance and Sexual Activity  Drug Use Yes   Frequency: 4.0 times per week   Types: Marijuana   Comment: "2 blunts"    Social History   Socioeconomic History   Marital status: Single    Spouse name: Not on file   Number of children: Not on file   Years of education: Not on file   Highest education level: Not on file  Occupational History   Not on file  Tobacco Use   Smoking status:  Every Day    Packs/day: 0.50    Types: Cigarettes    Start date: 11/29/2002   Smokeless tobacco: Former   Tobacco comments:    does not want to quit  Vaping Use   Vaping Use: Never used  Substance and Sexual Activity   Alcohol use: Yes    Alcohol/week: 0.0 standard drinks of alcohol    Comment: "Special occasions"   Drug use: Yes    Frequency: 4.0 times per week    Types: Marijuana    Comment: "2 blunts"   Sexual activity: Not on file  Other Topics Concern   Not on file  Social History Narrative   Not on file   Social Determinants of Health   Financial Resource Strain: Not on file  Food Insecurity: No Food Insecurity (04/28/2022)   Hunger Vital Sign    Worried About Running Out of Food in the Last Year: Never true    Ran Out of Food in the Last Year: Never true  Transportation Needs: No Transportation Needs (04/28/2022)   PRAPARE - Administrator, Civil Service (Medical): No    Lack of Transportation (Non-Medical): No  Physical Activity: Not on file  Stress: Not on file  Social Connections: Not on file    Hospital Course: Admitted to psychiatric unit.  15-minute checks continued.  Restarted on medications based on what he had been on most recently at his group home.  Restarted clozapine at a lower dose because he had been off it for a  week but we have been able to rapidly titrated up with good tolerance.  Labs all fine.  ANC fine.  Contacted his group home manager who confirms that he has a new psychiatrist with an appointment today on the day of discharge in the afternoon.  Prescriptions provided with medicine.  Patient agreeable to plan.  No longer showing agitation or acute psychotic symptoms  Physical Findings: AIMS: Facial and Oral Movements Muscles of Facial Expression: None, normal Lips and Perioral Area: None, normal Jaw: None, normal Tongue: None, normal,Extremity Movements Upper (arms, wrists, hands, fingers): None, normal Lower (legs, knees,  ankles, toes): None, normal, Trunk Movements Neck, shoulders, hips: None, normal, Overall Severity Severity of abnormal movements (highest score from questions above): None, normal Incapacitation due to abnormal movements: None, normal Patient's awareness of abnormal movements (rate only patient's report): No Awareness, Dental Status Current problems with teeth and/or dentures?: No Does patient usually wear dentures?: No  CIWA:    COWS:     Musculoskeletal: Strength & Muscle Tone: within normal limits Gait & Station: normal Patient leans: N/A   Psychiatric Specialty Exam:  Presentation  General Appearance: No data recorded Eye Contact:No data recorded Speech:No data recorded Speech Volume:No data recorded Handedness:No data recorded  Mood and Affect  Mood:No data recorded Affect:No data recorded  Thought Process  Thought Processes:No data recorded Descriptions of Associations:No data recorded Orientation:No data recorded Thought Content:No data recorded History of Schizophrenia/Schizoaffective disorder:Yes  Duration of Psychotic Symptoms:Greater than six months  Hallucinations:No data recorded Ideas of Reference:No data recorded Suicidal Thoughts:No data recorded Homicidal Thoughts:No data recorded  Sensorium  Memory:No data recorded Judgment:No data recorded Insight:No data recorded  Executive Functions  Concentration:No data recorded Attention Span:No data recorded Recall:No data recorded Fund of Knowledge:No data recorded Language:No data recorded  Psychomotor Activity  Psychomotor Activity:No data recorded  Assets  Assets:No data recorded  Sleep  Sleep:No data recorded   Physical Exam: Physical Exam Vitals reviewed.  Constitutional:      Appearance: Normal appearance.  HENT:     Head: Normocephalic and atraumatic.     Mouth/Throat:     Pharynx: Oropharynx is clear.  Eyes:     Pupils: Pupils are equal, round, and reactive to light.   Cardiovascular:     Rate and Rhythm: Normal rate and regular rhythm.  Pulmonary:     Effort: Pulmonary effort is normal.     Breath sounds: Normal breath sounds.  Abdominal:     General: Abdomen is flat.     Palpations: Abdomen is soft.  Musculoskeletal:        General: Normal range of motion.  Skin:    General: Skin is warm and dry.  Neurological:     General: No focal deficit present.     Mental Status: He is alert. Mental status is at baseline.  Psychiatric:        Mood and Affect: Mood normal.        Thought Content: Thought content normal.    Review of Systems  Constitutional: Negative.   HENT: Negative.    Eyes: Negative.   Respiratory: Negative.    Cardiovascular: Negative.   Gastrointestinal: Negative.   Musculoskeletal: Negative.   Skin: Negative.   Neurological: Negative.   Psychiatric/Behavioral: Negative.     Blood pressure (!) 132/98, pulse (!) 113, temperature 97.7 F (36.5 C), temperature source Oral, resp. rate 18, height 6' (1.829 m), weight 87.1 kg, SpO2 100 %. Body mass index is 26.04 kg/m.   Social History  Tobacco Use  Smoking Status Every Day   Packs/day: 0.50   Types: Cigarettes   Start date: 11/29/2002  Smokeless Tobacco Former  Tobacco Comments   does not want to quit   Tobacco Cessation:  A prescription for an FDA-approved tobacco cessation medication provided at discharge   Blood Alcohol level:  Lab Results  Component Value Date   [facility] <10 04/27/2022   ETH <10 12/13/2018    Metabolic Disorder Labs:  Lab Results  Component Value Date   HGBA1C 5.1 04/30/2022   MPG 99.67 04/30/2022   MPG 96.8 05/06/2018   Lab Results  Component Value Date   PROLACTIN 6.1 12/19/2014   PROLACTIN 1.3 (L) 11/30/2014   Lab Results  Component Value Date   CHOL 172 04/30/2022   TRIG 39 04/30/2022   HDL 37 (L) 04/30/2022   CHOLHDL 4.6 04/30/2022   VLDL 8 04/30/2022   LDLCALC 127 (H) 04/30/2022   LDLCALC 108 (H) 05/06/2018    See  Psychiatric Specialty Exam and Suicide Risk Assessment completed by Attending Physician prior to discharge.  Discharge destination:  Home  Is patient on multiple antipsychotic therapies at discharge:  No   Has Patient had three or more failed trials of antipsychotic monotherapy by history:  No  Recommended Plan for Multiple Antipsychotic Therapies: NA  Discharge Instructions     Diet - low sodium heart healthy   Complete by: As directed    Increase activity slowly   Complete by: As directed       Allergies as of 05/06/2022   No Known Allergies      Medication List     TAKE these medications      Indication  albuterol 108 (90 Base) MCG/ACT inhaler Commonly known as: VENTOLIN HFA Inhale 1-2 puffs into the lungs every 6 (six) hours as needed for shortness of breath. What changed:  when to take this reasons to take this  Indication: Asthma   benztropine 0.5 MG tablet Commonly known as: COGENTIN Take 2 tablets (1 mg total) by mouth 2 (two) times daily.  Indication: Extrapyramidal Reaction caused by Medications   buPROPion 150 MG 24 hr tablet Commonly known as: WELLBUTRIN XL Take 1 tablet (150 mg total) by mouth daily.  Indication: Major Depressive Disorder   cetirizine 10 MG tablet Commonly known as: ZYRTEC Take 1 tablet (10 mg total) by mouth daily.  Indication: Perennial Allergic Rhinitis   clonazePAM 0.5 MG tablet Commonly known as: KLONOPIN Take 1 tablet (0.5 mg total) by mouth 2 (two) times daily. What changed:  when to take this reasons to take this  Indication: Feeling Anxious   clozapine 50 MG tablet Commonly known as: CLOZARIL Take 7 tablets (350 mg total) by mouth at bedtime. What changed: medication strength  Indication: Schizophrenia that does Not Respond to Usual Drug Therapy   docusate sodium 100 MG capsule Commonly known as: COLACE Take 1 capsule (100 mg total) by mouth 2 (two) times daily.  Indication: Constipation   fluticasone 50  MCG/ACT nasal spray Commonly known as: FLONASE Place 2 sprays into both nostrils daily as needed for allergies or rhinitis. What changed:  when to take this reasons to take this  Indication: Allergic Rhinitis   hydrOXYzine 25 MG tablet Commonly known as: ATARAX Take 1 tablet (25 mg total) by mouth 3 (three) times daily as needed for anxiety.  Indication: Feeling Anxious   Invega Trinza 819 MG/2.625ML Susp Generic drug: Paliperidone Palmitate Inject 819 mg into the muscle.  Indication:  Schizophrenia   melatonin 5 MG Tabs Take 1 tablet (5 mg total) by mouth at bedtime.  Indication: Trouble Sleeping   montelukast 10 MG tablet Commonly known as: SINGULAIR Take 1 tablet (10 mg total) by mouth at bedtime.  Indication: Asthma   nicotine 21 mg/24hr patch Commonly known as: NICODERM CQ - dosed in mg/24 hours Place 1 patch (21 mg total) onto the skin daily.  Indication: Nicotine Addiction   propranolol 20 MG tablet Commonly known as: INDERAL Take 1 tablet (20 mg total) by mouth 2 (two) times daily. What changed:  medication strength how much to take when to take this  Indication: Neuroleptic-Induced Akathisia   Vitamin D (Ergocalciferol) 1.25 MG (50000 UNIT) Caps capsule Commonly known as: DRISDOL Take 1 capsule (50,000 Units total) by mouth every 7 (seven) days.  Indication: Vitamin D Deficiency        Follow-up Information     [facility], [facility].   Why: Appointment is scheduled for 05/06/2022 at 12:00PM. Contact information: [address] [place] [address] [phone]                 Follow-up recommendations:  Other:  Follow-up with new outpatient provider.  Continue current medicine.  Prescriptions provided  Comments: See above  Signed: Mordecai RasmussenJohn Mikhia Dusek, MD 05/06/2022, 10:10 AM

## 2022-05-06 NOTE — Plan of Care (Signed)
  Problem: Education: Goal: Knowledge of General Education information will improve Description: Including pain rating scale, medication(s)/side effects and non-pharmacologic comfort measures Outcome: Completed/Met   Problem: Health Behavior/Discharge Planning: Goal: Ability to manage health-related needs will improve Outcome: Completed/Met   Problem: Clinical Measurements: Goal: Cardiovascular complication will be avoided Outcome: Completed/Met   Problem: Coping: Goal: Level of anxiety will decrease Outcome: Completed/Met

## 2023-03-24 ENCOUNTER — Other Ambulatory Visit: Payer: Self-pay

## 2023-03-24 ENCOUNTER — Emergency Department
Admission: EM | Admit: 2023-03-24 | Discharge: 2023-03-26 | Disposition: A | Discharge status: Other Acute Inpatient Hospital | Payer: MEDICAID | Attending: Emergency Medicine | Admitting: Emergency Medicine

## 2023-03-24 DIAGNOSIS — F200 Paranoid schizophrenia: Secondary | ICD-10-CM | POA: Diagnosis not present

## 2023-03-24 DIAGNOSIS — R440 Auditory hallucinations: Secondary | ICD-10-CM

## 2023-03-24 DIAGNOSIS — F209 Schizophrenia, unspecified: Secondary | ICD-10-CM | POA: Insufficient documentation

## 2023-03-24 DIAGNOSIS — F1220 Cannabis dependence, uncomplicated: Secondary | ICD-10-CM | POA: Diagnosis not present

## 2023-03-24 DIAGNOSIS — R443 Hallucinations, unspecified: Secondary | ICD-10-CM

## 2023-03-24 LAB — COMPREHENSIVE METABOLIC PANEL
ALT: 46 U/L — ABNORMAL HIGH (ref 0–44)
AST: 51 U/L — ABNORMAL HIGH (ref 15–41)
Albumin: 4.4 g/dL (ref 3.5–5.0)
Alkaline Phosphatase: 66 U/L (ref 38–126)
Anion gap: 12 (ref 5–15)
BUN: 14 mg/dL (ref 6–20)
CO2: 24 mmol/L (ref 22–32)
Calcium: 9.6 mg/dL (ref 8.9–10.3)
Chloride: 102 mmol/L (ref 98–111)
Creatinine, Ser: 1.08 mg/dL (ref 0.61–1.24)
GFR, Estimated: >60 mL/min (ref >60)
Glucose, Bld: 109 mg/dL — ABNORMAL HIGH (ref 70–99)
Potassium: 3.8 mmol/L (ref 3.5–5.1)
Sodium: 138 mmol/L (ref 135–145)
Total Bilirubin: 1 mg/dL (ref 0.3–1.2)
Total Protein: 7.6 g/dL (ref 6.5–8.1)

## 2023-03-24 LAB — SALICYLATE LEVEL: Salicylate Lvl: <7 mg/dL — ABNORMAL LOW (ref 7.0–30.0)

## 2023-03-24 LAB — ACETAMINOPHEN LEVEL: Acetaminophen (Tylenol), Serum: <10 ug/mL — ABNORMAL LOW (ref 10–30)

## 2023-03-24 LAB — CBC
HCT: 43.7 % (ref 39.0–52.0)
Hemoglobin: 14.8 g/dL (ref 13.0–17.0)
MCH: 31.2 pg (ref 26.0–34.0)
MCHC: 33.9 g/dL (ref 30.0–36.0)
MCV: 92 fL (ref 80.0–100.0)
Platelets: 212 10*3/uL (ref 150–400)
RBC: 4.75 MIL/uL (ref 4.22–5.81)
RDW: 14.1 % (ref 11.5–15.5)
WBC: 10.1 10*3/uL (ref 4.0–10.5)
nRBC: 0 % (ref 0.0–0.2)

## 2023-03-24 LAB — ETHANOL: Alcohol, Ethyl (B): <10 mg/dL (ref <10)

## 2023-03-24 NOTE — ED Provider Notes (Signed)
[facility] Provider Note    Event Date/Time   First MD Initiated Contact with Patient 03/24/23 2200     (approximate)   History   Psychiatric Evaluation   HPI  Janie Strothman is a 37 y.o. male past medical history of significant for schizophrenia who presents to the emergency department with auditory and visual hallucinations.  Patient states that he has not been taking his medications for the past 1 week.   States that he is has been having hallucinations and feels like he does not have any organs.  Denies any SI or HI.  Prior inpatient psychiatric hospitalizations in the past.     Physical Exam   Triage Vital Signs: ED Triage Vitals  Encounter Vitals Group     BP 03/24/23 2132 (!) 140/83     Systolic BP Percentile --      Diastolic BP Percentile --      Pulse Rate 03/24/23 2132 100     Resp 03/24/23 2132 (!) 21     Temp 03/24/23 2132 98.4 F (36.9 C)     Temp Source 03/24/23 2132 Oral     SpO2 03/24/23 2132 97 %     Weight 03/24/23 2132 210 lb (95.3 kg)     Height 03/24/23 2132 6' (1.829 m)     Head Circumference --      Peak Flow --      Pain Score 03/24/23 2131 0     Pain Loc --      Pain Education --      Exclude from Growth Chart --     Most recent vital signs: Vitals:   03/24/23 2132  BP: (!) 140/83  Pulse: 100  Resp: (!) 21  Temp: 98.4 F (36.9 C)  SpO2: 97%    Physical Exam Constitutional:      Appearance: He is well-developed.  HENT:     Head: Atraumatic.  Eyes:     Conjunctiva/sclera: Conjunctivae normal.  Cardiovascular:     Rate and Rhythm: Regular rhythm.  Pulmonary:     Effort: No respiratory distress.  Musculoskeletal:     Cervical back: Normal range of motion.  Skin:    General: Skin is warm.  Neurological:     Mental Status: He is alert. Mental status is at baseline.  Psychiatric:        Attention and Perception: Attention normal.        Mood and Affect: Mood is anxious.        Speech: Speech  is tangential.        Thought Content: Thought content is paranoid and delusional. Thought content does not include homicidal or suicidal ideation.        Judgment: Judgment is impulsive.     IMPRESSION / MDM / ASSESSMENT AND PLAN / ED COURSE  I reviewed the triage vital signs and the nursing notes.  Differential diagnosis including schizophrenia, substance abuse, electrolyte abnormality   LABS (all labs ordered are listed, but only abnormal results are displayed) Labs interpreted as -    Labs Reviewed  COMPREHENSIVE METABOLIC PANEL - Abnormal; Notable for the following components:      Result Value   Glucose, Bld 109 (*)    AST 51 (*)    ALT 46 (*)    All other components within normal limits  SALICYLATE LEVEL - Abnormal; Notable for the following components:   Salicylate Lvl <7.0 (*)    All other components within normal limits  ACETAMINOPHEN LEVEL - Abnormal; Notable for the following components:   Acetaminophen (Tylenol), Serum <10 (*)    All other components within normal limits  ETHANOL  CBC  URINE DRUG SCREEN, QUALITATIVE ([facility] ONLY)    MDM    No significant electrolyte abnormality.  Alcohol level negative.  UDS is currently pending.  Filled out involuntary commitment paperwork given concern for auditory hallucinations and acute psychosis.  Consulted psychiatry for evaluation.  Medically cleared.   The patient has been placed in psychiatric observation due to the need to provide a safe environment for the patient while obtaining psychiatric consultation and evaluation, as well as ongoing medical and medication management to treat the patient's condition.  The patient has been placed under full IVC at this time.   PROCEDURES:  Critical Care performed: No  Procedures  Patient's presentation is most consistent with acute presentation with potential threat to life or bodily function.   MEDICATIONS ORDERED IN ED: Medications - No data to display  FINAL  CLINICAL IMPRESSION(S) / ED DIAGNOSES   Final diagnoses:  Auditory hallucination     Rx / DC Orders   ED Discharge Orders     None        Note:  This document was prepared using Dragon voice recognition software and may include unintentional dictation errors.   Corena Herter, MD 03/24/23 2306

## 2023-03-24 NOTE — ED Triage Notes (Signed)
Pt reports has not been able to get medications filled. Pt reports being scared at home living on his own now and seems to be stating that he has been talking with his deceased mother. Pt does endorse visual and auditory hallucinations. Reported to [facility] that he hasn't had medications in 2 wks. Pt denies SI or HI. Pt is anxious but over all calm and cooperative. Denies physical pain.

## 2023-03-24 NOTE — ED Notes (Signed)
Patient dressed out into hospital scrubs at this time. This RN and EDT Nicki Guadalajara as witness. Patient belongings placed into hospital safe location. Belongings as follows:  1 pair black shoes 1 pair socks 1 pair black sweatpants  1 white t-shirt

## 2023-03-24 NOTE — Consult Note (Signed)
Telepsych Consultation   Reason for Consult:  Psych Evaluation Referring Physician:  Dr. Arnoldo Morale Location of Patient: [facility] ER Location of Provider: Other: Remote Office  Patient Identification: Anthony Luna MRN:  [id number] Principal Diagnosis: Schizophrenia [facility]) Diagnosis:  Principal Problem:   Schizophrenia (HCC) Active Problems:   Cannabis use disorder, severe, dependence (HCC)   Total Time spent with patient: 30 minutes  Subjective:   " Voices"  HPI:  Tele psych Assessment   Anthony Luna, 36 y.o., male patient seen via tele health by TTS and this provider; chart reviewed and consulted with Dr. Katrinka Blazing on 03/25/23.  Per chart review, ER physician states, pt has past medical history of significant for schizophrenia who presents to the emergency department with auditory and visual hallucinations.  Patient states that he has not been taking his medications for the past 1 week.   States that he is has been having hallucinations and feels like he does not have any organs.  Denies any SI or HI.  Patient has had prior inpatient psychiatric hospitalizations in the past.    On evaluation Anthony Luna reports that he is scared to be home alone. He is unable to state reasons why.  He denies that he feels his organs are missing. He appears to be more lucid than when he  presented to the er.  He reports that he hasn't been on his medications for awhile.  He says he doesn't know who was prescribing his medication.  He then becomes agitated during the assessment and begin to yell that he has been answering the same questions over and over.  The assessment was discontinued in effort not to further aggravate the patient.      Recommendations: Psychiatric inpatient hospitalization  Dr. Katrinka Blazing informed of above recommendation and disposition  Past Psychiatric History: Schizophrenia  Risk to Self:   Risk to Others:   Prior Inpatient Therapy:   Prior Outpatient Therapy:     Past Medical History:  Past Medical History:  Diagnosis Date   Depression    Since moving into new apartment 1 year ago   None to low serum cortisol response with adrenocorticotrophic hormone (ACTH) stimulation test    Schizophrenia, paranoid type (HCC)    No past surgical history on file. Family History: No family history on file. Family Psychiatric  History: Unknown Social History:  Social History   Substance and Sexual Activity  Alcohol Use Yes   Alcohol/week: 0.0 standard drinks of alcohol   Comment: "Special occasions"     Social History   Substance and Sexual Activity  Drug Use Yes   Frequency: 4.0 times per week   Types: Marijuana   Comment: "2 blunts"    Social History   Socioeconomic History   Marital status: Single    Spouse name: Not on file   Number of children: Not on file   Years of education: Not on file   Highest education level: Not on file  Occupational History   Not on file  Tobacco Use   Smoking status: Every Day    Current packs/day: 0.50    Average packs/day: 0.5 packs/day for 20.3 years (10.2 ttl pk-yrs)    Types: Cigarettes    Start date: 11/29/2002   Smokeless tobacco: Former   Tobacco comments:    does not want to quit  Vaping Use   Vaping status: Never Used  Substance and Sexual Activity   Alcohol use: Yes    Alcohol/week: 0.0 standard drinks of  alcohol    Comment: "Special occasions"   Drug use: Yes    Frequency: 4.0 times per week    Types: Marijuana    Comment: "2 blunts"   Sexual activity: Not on file  Other Topics Concern   Not on file  Social History Narrative   Not on file   Social Determinants of Health   Financial Resource Strain: Not on file  Food Insecurity: No Food Insecurity (04/28/2022)   Hunger Vital Sign    Worried About Running Out of Food in the Last Year: Never true    Ran Out of Food in the Last Year: Never true  Transportation Needs: No Transportation Needs (04/28/2022)   PRAPARE - Therapist, art (Medical): No    Lack of Transportation (Non-Medical): No  Physical Activity: Not on file  Stress: Not on file  Social Connections: Not on file   Additional Social History:    Allergies:  No Known Allergies  Labs:  Results for orders placed or performed during the hospital encounter of 03/24/23 (from the past 48 hour(s))  Comprehensive metabolic panel     Status: Abnormal   Collection Time: 03/24/23  9:36 PM  Result Value Ref Range   Sodium 138 135 - 145 mmol/L   Potassium 3.8 3.5 - 5.1 mmol/L   Chloride 102 98 - 111 mmol/L   CO2 24 22 - 32 mmol/L   Glucose, Bld 109 (H) 70 - 99 mg/dL    Comment: Glucose reference range applies only to samples taken after fasting for at least 8 hours.   BUN 14 6 - 20 mg/dL   Creatinine, Ser 1.61 0.61 - 1.24 mg/dL   Calcium 9.6 8.9 - 09.6 mg/dL   Total Protein 7.6 6.5 - 8.1 g/dL   Albumin 4.4 3.5 - 5.0 g/dL   AST 51 (H) 15 - 41 U/L   ALT 46 (H) 0 - 44 U/L   Alkaline Phosphatase 66 38 - 126 U/L   Total Bilirubin 1.0 0.3 - 1.2 mg/dL   GFR, Estimated >04 >54 mL/min    Comment: (NOTE) Calculated using the CKD-EPI Creatinine Equation (2021)    Anion gap 12 5 - 15    Comment: Performed at [facility], [address] [place], [place] [address]  Ethanol     Status: None   Collection Time: 03/24/23  9:36 PM  Result Value Ref Range   Alcohol, Ethyl (B) <10 <10 mg/dL    Comment: (NOTE) Lowest detectable limit for serum alcohol is 10 mg/dL.  For medical purposes only. Performed at [facility], [address] [place], [place] [address]   Salicylate level     Status: Abnormal   Collection Time: 03/24/23  9:36 PM  Result Value Ref Range   Salicylate Lvl <7.0 (L) 7.0 - 30.0 mg/dL    Comment: Performed at [facility], [address] [place], [place] [address]  Acetaminophen level     Status: Abnormal   Collection Time: 03/24/23  9:36 PM  Result Value Ref Range   Acetaminophen  (Tylenol), Serum <10 (L) 10 - 30 ug/mL    Comment: (NOTE) Therapeutic concentrations vary significantly. A range of 10-30 ug/mL  may be an effective concentration for many patients. However, some  are best treated at concentrations outside of this range. Acetaminophen concentrations >150 ug/mL at 4 hours after ingestion  and >50 ug/mL at 12 hours after ingestion are often associated with  toxic reactions.  Performed  at [facility], [address] [place], [place] [address]   cbc     Status: None   Collection Time: 03/24/23  9:36 PM  Result Value Ref Range   WBC 10.1 4.0 - 10.5 K/uL   RBC 4.75 4.22 - 5.81 MIL/uL   Hemoglobin 14.8 13.0 - 17.0 g/dL   HCT 78.2 95.6 - 21.3 %   MCV 92.0 80.0 - 100.0 fL   MCH 31.2 26.0 - 34.0 pg   MCHC 33.9 30.0 - 36.0 g/dL   RDW 08.6 57.8 - 46.9 %   Platelets 212 150 - 400 K/uL   nRBC 0.0 0.0 - 0.2 %    Comment: Performed at [facility], [address] [place], [place] [address]    Medications:  No current facility-administered medications for this encounter.   Current Outpatient Medications  Medication Sig Dispense Refill   albuterol (VENTOLIN HFA) 108 (90 Base) MCG/ACT inhaler Inhale 1-2 puffs into the lungs every 6 (six) hours as needed for shortness of breath. 18 g 1   benztropine (COGENTIN) 0.5 MG tablet Take 2 tablets (1 mg total) by mouth 2 (two) times daily. 60 tablet 1   buPROPion (WELLBUTRIN XL) 150 MG 24 hr tablet Take 1 tablet (150 mg total) by mouth daily. 30 tablet 1   cetirizine (ZYRTEC) 10 MG tablet Take 1 tablet (10 mg total) by mouth daily. 30 tablet 1   clonazePAM (KLONOPIN) 0.5 MG tablet Take 1 tablet (0.5 mg total) by mouth 2 (two) times daily. 60 tablet 1   cloZAPine (CLOZARIL) 50 MG tablet Take 7 tablets (350 mg total) by mouth at bedtime. 210 tablet 0   docusate sodium (COLACE) 100 MG capsule Take 1 capsule (100 mg total) by mouth 2 (two) times daily. 60 capsule 1   fluticasone (FLONASE) 50 MCG/ACT  nasal spray Place 2 sprays into both nostrils daily as needed for allergies or rhinitis. 16 g 1   hydrOXYzine (ATARAX) 25 MG tablet Take 1 tablet (25 mg total) by mouth 3 (three) times daily as needed for anxiety. 30 tablet 1   melatonin 5 MG TABS Take 1 tablet (5 mg total) by mouth at bedtime. 30 tablet 1   montelukast (SINGULAIR) 10 MG tablet Take 1 tablet (10 mg total) by mouth at bedtime. 30 tablet 1   nicotine (NICODERM CQ - DOSED IN MG/24 HOURS) 21 mg/24hr patch Place 1 patch (21 mg total) onto the skin daily. 28 patch 1   Paliperidone Palmitate (INVEGA TRINZA) 819 MG/2.625ML SUSP Inject 819 mg into the muscle.     propranolol (INDERAL) 20 MG tablet Take 1 tablet (20 mg total) by mouth 2 (two) times daily. 60 tablet 1   Vitamin D, Ergocalciferol, (DRISDOL) 1.25 MG (50000 UNIT) CAPS capsule Take 1 capsule (50,000 Units total) by mouth every 7 (seven) days. 5 capsule 1    Musculoskeletal: Strength & Muscle Tone: within normal limits Gait & Station: normal Patient leans: N/A  Psychiatric Specialty Exam:  Presentation  General Appearance: Appropriate for Environment  Eye Contact:Fair; Fleeting  Speech:Pressured; Blocked  Speech Volume:Decreased  Handedness:Right   Mood and Affect  Mood:Anxious; Dysphoric; Irritable  Affect:Labile   Thought Process  Thought Processes:Coherent  Descriptions of Associations:Circumstantial  Orientation:Partial  Thought Content:Paranoid Ideation; Delusions  History of Schizophrenia/Schizoaffective disorder:Yes  Duration of Psychotic Symptoms:Greater than six months  Hallucinations:Hallucinations: Auditory  Ideas of Reference:Delusions; Paranoia  Suicidal Thoughts:Suicidal Thoughts: No  Homicidal Thoughts:Homicidal Thoughts: No   Sensorium  Memory:Immediate Poor; Remote Poor  Judgment:Poor  Insight:Poor   Executive Functions  Concentration:Poor  Attention Span:Poor  Recall:Poor  Fund of  Knowledge:Poor  Language:Poor   Psychomotor Activity  Psychomotor Activity:Psychomotor Activity: Normal   Assets  Assets:Communication Skills; Desire for Improvement; Housing; Social Support   Sleep  Sleep:Sleep: Poor    Physical Exam: Physical Exam Vitals and nursing note reviewed.  HENT:     Head: Normocephalic and atraumatic.     Nose: Nose normal.     Mouth/Throat:     Mouth: Mucous membranes are dry.  Eyes:     Pupils: Pupils are equal, round, and reactive to light.  Pulmonary:     Effort: Pulmonary effort is normal.  Musculoskeletal:        General: Normal range of motion.     Cervical back: Normal range of motion.  Skin:    General: Skin is dry.  Neurological:     Mental Status: He is alert and oriented to person, place, and time.  Psychiatric:        Attention and Perception: He perceives auditory hallucinations.        Mood and Affect: Mood is anxious. Affect is labile, angry and inappropriate.        Speech: Speech normal.        Behavior: Behavior is agitated.        Thought Content: Thought content is paranoid and delusional. Thought content does not include suicidal ideation. Thought content does not include suicidal plan.        Cognition and Memory: Cognition is impaired. Memory is impaired.        Judgment: Judgment is impulsive.    Review of Systems  Psychiatric/Behavioral:  Positive for hallucinations. The patient is nervous/anxious and has insomnia.   All other systems reviewed and are negative.  Blood pressure (!) 140/83, pulse 100, temperature 98.4 F (36.9 C), temperature source Oral, resp. rate (!) 21, height 6' (1.829 m), weight 95.3 kg, SpO2 97%. Body mass index is 28.48 kg/m.  Treatment Plan Summary: Benjamine Strout was admitted to [facility] ER for Schizophrenia [facility]), crisis management, and stabilization. Routine labs ordered, which include Lab Orders         Comprehensive metabolic panel         Ethanol         Salicylate level          Acetaminophen level         cbc         Urine Drug Screen, Qualitative    Medication Management: Medications started  Will maintain observation checks every 15 minutes for safety. Psychosocial education regarding relapse prevention and self-care; social and communication  Social work will consult with family for collateral information and discuss discharge and follow up plan.  Disposition: Recommend psychiatric Inpatient admission when medically cleared. Supportive therapy provided about ongoing stressors. Discussed crisis plan, support from social network, calling 911, coming to the Emergency Department, and calling Suicide Hotline.  This service was provided via telemedicine using a 2-way, interactive audio and video technology.    Jearld Lesch, NP 03/25/2023 12:15 AM

## 2023-03-24 NOTE — Consult Note (Incomplete)
Telepsych Consultation   Reason for Consult:  Psych Evaluation Referring Physician:  Dr. Arnoldo Morale Location of Patient: [facility] ER Location of Provider: Other: Remote Office  Patient Identification: Anthony Luna MRN:  [id number] Principal Diagnosis: Schizophrenia [facility]) Diagnosis:  Principal Problem:   Schizophrenia (HCC) Active Problems:   Cannabis use disorder, severe, dependence (HCC)   Total Time spent with patient: 30 minutes  Subjective:   " Voices"  HPI:  Tele psych Assessment   Anthony Luna, 37 y.o., male patient seen via tele health by TTS and this provider; chart reviewed and consulted with Dr. Katrinka Blazing on 03/24/23.  Per chart review, ER physician states, pt has past medical history of significant for schizophrenia who presents to the emergency department with auditory and visual hallucinations.  Patient states that he has not been taking his medications for the past 1 week.   States that he is has been having hallucinations and feels like he does not have any organs.  Denies any SI or HI.  Patient has had prior inpatient psychiatric hospitalizations in the past.    On evaluation [name] reports that he is scared to be home alone. He is unable to state reasons why.  He denies that he feels his organs are missing. He appears to be more lucid than when he  presented to the er.   He reports that he hasn't been on his medications for awhile.   He says he doesn't know who was prescribing his medication.  He then becomes agitated during the assessment and begin to yell that he has been answering the same questions over and over.  The assessment was discontinued in effort not to further aggravate the patient.   During evaluation Anthony Luna is ***(position); ***he/she is alert/oriented x 4; calm/cooperative; and mood congruent with affect.  Patient is speaking in a clear tone at moderate volume, and normal pace; with good eye contact.  ***His/Her thought  process is coherent and relevant; There is no indication that ***he/she is currently responding to internal/external stimuli or experiencing delusional thought content.  Patient denies suicidal/self-harm/homicidal ideation, psychosis, and paranoia.  Patient has remained calm throughout assessment and has answered questions appropriately.    Recommendations:  Dr. Katrinka Blazing informed of above recommendation and disposition  Past Psychiatric History: Schizophrenia  Risk to Self:   Risk to Others:   Prior Inpatient Therapy:   Prior Outpatient Therapy:    Past Medical History:  Past Medical History:  Diagnosis Date  . Depression    Since moving into new apartment 1 year ago  . None to low serum cortisol response with adrenocorticotrophic hormone (ACTH) stimulation test   . Schizophrenia, paranoid type (HCC)    No past surgical history on file. Family History: No family history on file. Family Psychiatric  History: *** Social History:  Social History   Substance and Sexual Activity  Alcohol Use Yes  . Alcohol/week: 0.0 standard drinks of alcohol   Comment: "Special occasions"     Social History   Substance and Sexual Activity  Drug Use Yes  . Frequency: 4.0 times per week  . Types: Marijuana   Comment: "2 blunts"    Social History   Socioeconomic History  . Marital status: Single    Spouse name: Not on file  . Number of children: Not on file  . Years of education: Not on file  . Highest education level: Not on file  Occupational History  . Not on file  Tobacco Use  .  Smoking status: Every Day    Current packs/day: 0.50    Average packs/day: 0.5 packs/day for 20.3 years (10.2 ttl pk-yrs)    Types: Cigarettes    Start date: 11/29/2002  . Smokeless tobacco: Former  . Tobacco comments:    does not want to quit  Vaping Use  . Vaping status: Never Used  Substance and Sexual Activity  . Alcohol use: Yes    Alcohol/week: 0.0 standard drinks of alcohol    Comment: "Special  occasions"  . Drug use: Yes    Frequency: 4.0 times per week    Types: Marijuana    Comment: "2 blunts"  . Sexual activity: Not on file  Other Topics Concern  . Not on file  Social History Narrative  . Not on file   Social Determinants of Health   Financial Resource Strain: Not on file  Food Insecurity: No Food Insecurity (04/28/2022)   Hunger Vital Sign   . Worried About Programme researcher, broadcasting/film/video in the Last Year: Never true   . Ran Out of Food in the Last Year: Never true  Transportation Needs: No Transportation Needs (04/28/2022)   PRAPARE - Transportation   . Lack of Transportation (Medical): No   . Lack of Transportation (Non-Medical): No  Physical Activity: Not on file  Stress: Not on file  Social Connections: Not on file   Additional Social History:    Allergies:  No Known Allergies  Labs:  Results for orders placed or performed during the hospital encounter of 03/24/23 (from the past 48 hour(s))  Comprehensive metabolic panel     Status: Abnormal   Collection Time: 03/24/23  9:36 PM  Result Value Ref Range   Sodium 138 135 - 145 mmol/L   Potassium 3.8 3.5 - 5.1 mmol/L   Chloride 102 98 - 111 mmol/L   CO2 24 22 - 32 mmol/L   Glucose, Bld 109 (H) 70 - 99 mg/dL    Comment: Glucose reference range applies only to samples taken after fasting for at least 8 hours.   BUN 14 6 - 20 mg/dL   Creatinine, Ser 1.61 0.61 - 1.24 mg/dL   Calcium 9.6 8.9 - 09.6 mg/dL   Total Protein 7.6 6.5 - 8.1 g/dL   Albumin 4.4 3.5 - 5.0 g/dL   AST 51 (H) 15 - 41 U/L   ALT 46 (H) 0 - 44 U/L   Alkaline Phosphatase 66 38 - 126 U/L   Total Bilirubin 1.0 0.3 - 1.2 mg/dL   GFR, Estimated >04 >54 mL/min    Comment: (NOTE) Calculated using the CKD-EPI Creatinine Equation (2021)    Anion gap 12 5 - 15    Comment: Performed at [facility], [address] [place], [place] [address]  Ethanol     Status: None   Collection Time: 03/24/23  9:36 PM  Result Value Ref Range   Alcohol,  Ethyl (B) <10 <10 mg/dL    Comment: (NOTE) Lowest detectable limit for serum alcohol is 10 mg/dL.  For medical purposes only. Performed at [facility], [address] [place], [place] [address]   Salicylate level     Status: Abnormal   Collection Time: 03/24/23  9:36 PM  Result Value Ref Range   Salicylate Lvl <7.0 (L) 7.0 - 30.0 mg/dL    Comment: Performed at [facility], [address] [place], [place] [address]  Acetaminophen level     Status: Abnormal   Collection Time: 03/24/23  9:36 PM  Result Value Ref Range   Acetaminophen (Tylenol), Serum <10 (L) 10 - 30 ug/mL    Comment: (NOTE) Therapeutic concentrations vary significantly. A range of 10-30 ug/mL  may be an effective concentration for many patients. However, some  are best treated at concentrations outside of this range. Acetaminophen concentrations >150 ug/mL at 4 hours after ingestion  and >50 ug/mL at 12 hours after ingestion are often associated with  toxic reactions.  Performed at [facility], [address] [place], [place] [address]   cbc     Status: None   Collection Time: 03/24/23  9:36 PM  Result Value Ref Range   WBC 10.1 4.0 - 10.5 K/uL   RBC 4.75 4.22 - 5.81 MIL/uL   Hemoglobin 14.8 13.0 - 17.0 g/dL   HCT 60.4 54.0 - 98.1 %   MCV 92.0 80.0 - 100.0 fL   MCH 31.2 26.0 - 34.0 pg   MCHC 33.9 30.0 - 36.0 g/dL   RDW 19.1 47.8 - 29.5 %   Platelets 212 150 - 400 K/uL   nRBC 0.0 0.0 - 0.2 %    Comment: Performed at [facility], [address] [place], [place] [address]    Medications:  No current facility-administered medications for this encounter.   Current Outpatient Medications  Medication Sig Dispense Refill  . albuterol (VENTOLIN HFA) 108 (90 Base) MCG/ACT inhaler Inhale 1-2 puffs into the lungs every 6 (six) hours as needed for shortness of breath. 18 g 1  . benztropine (COGENTIN) 0.5 MG tablet Take 2 tablets (1 mg total) by mouth 2 (two) times  daily. 60 tablet 1  . buPROPion (WELLBUTRIN XL) 150 MG 24 hr tablet Take 1 tablet (150 mg total) by mouth daily. 30 tablet 1  . cetirizine (ZYRTEC) 10 MG tablet Take 1 tablet (10 mg total) by mouth daily. 30 tablet 1  . clonazePAM (KLONOPIN) 0.5 MG tablet Take 1 tablet (0.5 mg total) by mouth 2 (two) times daily. 60 tablet 1  . cloZAPine (CLOZARIL) 50 MG tablet Take 7 tablets (350 mg total) by mouth at bedtime. 210 tablet 0  . docusate sodium (COLACE) 100 MG capsule Take 1 capsule (100 mg total) by mouth 2 (two) times daily. 60 capsule 1  . fluticasone (FLONASE) 50 MCG/ACT nasal spray Place 2 sprays into both nostrils daily as needed for allergies or rhinitis. 16 g 1  . hydrOXYzine (ATARAX) 25 MG tablet Take 1 tablet (25 mg total) by mouth 3 (three) times daily as needed for anxiety. 30 tablet 1  . melatonin 5 MG TABS Take 1 tablet (5 mg total) by mouth at bedtime. 30 tablet 1  . montelukast (SINGULAIR) 10 MG tablet Take 1 tablet (10 mg total) by mouth at bedtime. 30 tablet 1  . nicotine (NICODERM CQ - DOSED IN MG/24 HOURS) 21 mg/24hr patch Place 1 patch (21 mg total) onto the skin daily. 28 patch 1  . Paliperidone Palmitate (INVEGA TRINZA) 819 MG/2.625ML SUSP Inject 819 mg into the muscle.    . propranolol (INDERAL) 20 MG tablet Take 1 tablet (20 mg total) by mouth 2 (two) times daily. 60 tablet 1  . Vitamin D, Ergocalciferol, (DRISDOL) 1.25 MG (50000 UNIT) CAPS capsule Take 1 capsule (50,000 Units total) by mouth every 7 (seven) days. 5 capsule 1    Musculoskeletal: Strength & Muscle Tone: {desc; muscle tone:32375} Gait & Station: {PE GAIT ED QMVH:84696} Patient leans: {Patient Leans:21022755}          Psychiatric Specialty  Exam:  Presentation  General Appearance: No data recorded Eye Contact:No data recorded Speech:No data recorded Speech Volume:No data recorded Handedness:No data recorded  Mood and Affect  Mood:No data recorded Affect:No data recorded  Thought Process   Thought Processes:No data recorded Descriptions of Associations:No data recorded Orientation:No data recorded Thought Content:No data recorded History of Schizophrenia/Schizoaffective disorder:Yes  Duration of Psychotic Symptoms:Greater than six months  Hallucinations:No data recorded Ideas of Reference:No data recorded Suicidal Thoughts:No data recorded Homicidal Thoughts:No data recorded  Sensorium  Memory:No data recorded Judgment:No data recorded Insight:No data recorded  Executive Functions  Concentration:No data recorded Attention Span:No data recorded Recall:No data recorded Fund of Knowledge:No data recorded Language:No data recorded  Psychomotor Activity  Psychomotor Activity:No data recorded  Assets  Assets:No data recorded  Sleep  Sleep:No data recorded   Physical Exam: Physical Exam Vitals and nursing note reviewed.    ROS Blood pressure (!) 140/83, pulse 100, temperature 98.4 F (36.9 C), temperature source Oral, resp. rate (!) 21, height 6' (1.829 m), weight 95.3 kg, SpO2 97%. Body mass index is 28.48 kg/m.  Treatment Plan Summary: {CHL [facility] MD [place] WUJW:[id number]}  Disposition: {CHL [facility] Consult Plan:20772}  This service was provided via telemedicine using a 2-way, interactive audio and video technology.    Jearld Lesch, NP 03/24/2023 11:35 PM

## 2023-03-24 NOTE — ED Notes (Signed)
Pt's sister Jonathin Heinicke) called in regards to Officer informing her of pt being IVC'd. Per sister, please give her contact info to pt to call her during phone hours tomorrow @ [phone].

## 2023-03-24 NOTE — ED Triage Notes (Signed)
Pt states "the voices were telling me that I didn't have any organs other than a liver, and then everything went dark. That's scary."

## 2023-03-25 DIAGNOSIS — R443 Hallucinations, unspecified: Secondary | ICD-10-CM

## 2023-03-25 DIAGNOSIS — F201 Disorganized schizophrenia: Secondary | ICD-10-CM | POA: Diagnosis not present

## 2023-03-25 DIAGNOSIS — R440 Auditory hallucinations: Secondary | ICD-10-CM

## 2023-03-25 LAB — CBC WITH DIFFERENTIAL/PLATELET
Abs Immature Granulocytes: 0.02 10*3/uL (ref 0.00–0.07)
Basophils Absolute: 0 10*3/uL (ref 0.0–0.1)
Basophils Relative: 1 %
Eosinophils Absolute: 0.2 10*3/uL (ref 0.0–0.5)
Eosinophils Relative: 2 %
HCT: 47.1 % (ref 39.0–52.0)
Hemoglobin: 15.5 g/dL (ref 13.0–17.0)
Immature Granulocytes: 0 %
Lymphocytes Relative: 20 %
Lymphs Abs: 1.7 10*3/uL (ref 0.7–4.0)
MCH: 30.7 pg (ref 26.0–34.0)
MCHC: 32.9 g/dL (ref 30.0–36.0)
MCV: 93.3 fL (ref 80.0–100.0)
Monocytes Absolute: 0.8 10*3/uL (ref 0.1–1.0)
Monocytes Relative: 9 %
Neutro Abs: 5.8 10*3/uL (ref 1.7–7.7)
Neutrophils Relative %: 68 %
Platelets: 220 10*3/uL (ref 150–400)
RBC: 5.05 MIL/uL (ref 4.22–5.81)
RDW: 14.2 % (ref 11.5–15.5)
WBC: 8.5 10*3/uL (ref 4.0–10.5)
nRBC: 0 % (ref 0.0–0.2)

## 2023-03-25 LAB — URINE DRUG SCREEN, QUALITATIVE (ARMC ONLY)
Amphetamines, Ur Screen: NOT DETECTED
Barbiturates, Ur Screen: NOT DETECTED
Benzodiazepine, Ur Scrn: NOT DETECTED
Cannabinoid 50 Ng, Ur ~~LOC~~: POSITIVE — AB
Cocaine Metabolite,Ur ~~LOC~~: NOT DETECTED
MDMA (Ecstasy)Ur Screen: NOT DETECTED
Methadone Scn, Ur: NOT DETECTED
Opiate, Ur Screen: NOT DETECTED
Phencyclidine (PCP) Ur S: NOT DETECTED
Tricyclic, Ur Screen: NOT DETECTED

## 2023-03-25 MED ORDER — DIPHENHYDRAMINE HCL 25 MG PO CAPS: 50.0000 mg | ORAL_CAPSULE | Freq: Three times a day (TID) | ORAL | Status: DC | PRN

## 2023-03-25 MED ORDER — HALOPERIDOL 5 MG PO TABS: 5.0000 mg | ORAL_TABLET | Freq: Three times a day (TID) | ORAL | Status: DC | PRN

## 2023-03-25 MED ORDER — LORAZEPAM 2 MG PO TABS: 2.0000 mg | ORAL_TABLET | Freq: Three times a day (TID) | ORAL | Status: DC | PRN

## 2023-03-25 MED ORDER — HYDROXYZINE HCL 25 MG PO TABS: 25.0000 mg | ORAL_TABLET | Freq: Three times a day (TID) | ORAL | Status: DC | PRN

## 2023-03-25 MED ORDER — HALOPERIDOL LACTATE 5 MG/ML IJ SOLN: 5.0000 mg | Freq: Three times a day (TID) | INTRAMUSCULAR | Status: DC | PRN

## 2023-03-25 MED ORDER — LORAZEPAM 2 MG/ML IJ SOLN: 2.0000 mg | Freq: Three times a day (TID) | INTRAMUSCULAR | Status: DC | PRN

## 2023-03-25 MED ORDER — DOCUSATE SODIUM 100 MG PO CAPS
100.0000 mg | ORAL_CAPSULE | Freq: Two times a day (BID) | ORAL | Status: DC
  Administered 2023-03-25 – 2023-03-26 (×4): 100 mg via ORAL
  Filled 2023-03-25 (×3): qty 1

## 2023-03-25 MED ORDER — POLYETHYLENE GLYCOL 3350 17 G PO PACK
17.0000 g | PACK | Freq: Every day | ORAL | Status: DC
  Administered 2023-03-25 – 2023-03-26 (×2): 17 g via ORAL
  Filled 2023-03-25 (×2): qty 1

## 2023-03-25 MED ORDER — TRAZODONE HCL 50 MG PO TABS: 50.0000 mg | ORAL_TABLET | Freq: Every evening | ORAL | Status: DC | PRN

## 2023-03-25 MED ORDER — DIPHENHYDRAMINE HCL 50 MG/ML IJ SOLN: 50.0000 mg | Freq: Three times a day (TID) | INTRAMUSCULAR | Status: DC | PRN

## 2023-03-25 NOTE — ED Notes (Signed)
Pt requested shower; provided clean hospital clothing and linens.  Shower setup provided with soap, shampoo, toothbrush/toothpaste, and deodorant.  Pt able to preform own ADL's with no assistance.    

## 2023-03-25 NOTE — BH Assessment (Signed)
Comprehensive Clinical Assessment (CCA) Note  03/25/2023 Anthony Luna [id number] Recommendations for Services/Supports/Treatments: Psych NP Anthony D. determined pt. meets psychiatric inpatient criteria. Anthony Luna is a 37 year old, English speaking, Black male with a psych hx schizophrenia. Pt presented to [facility] ED under IVC. Per triage note: Pt reports has not been able to get medications filled. Pt reports being scared at home living on his own now and seems to be stating that he has been talking with his deceased mother. Pt does endorse visual and auditory hallucinations. Reported to [facility] that he hasn't had medications in 2 wks.      Pt was sleeping soundly upon this writer's arrival. Pt had paucity of speech. Pt was lethargic; but able to participate in the assessment. Motor behavior was slowed. Pt had poor eye contact. Pt's mood was irritable and affect was labile. Pt reported that he has not been able to access his medications for the last 2 weeks. Pt reported that he lives alone and does not have an ACT Team. Pt began to yell when asked specific details about his medications. Pt had difficulty concentrating and was disorganized. Pt was guarded and defensive. Pt admitted to cannabis use; however, pt. was unable to provide specific details surrounding use. Pt had lacking insight and impaired judgment. The patient denied current SI/HI or AV/H. Pt admitted to being afraid to return to home alone.  Chief Complaint:  Chief Complaint  Patient presents with   Psychiatric Evaluation   Visit Diagnosis:  Schizophrenia (HCC) Active Problems:   Cannabis use disorder, severe, dependence (HCC)   CCA Screening, Triage and Referral (STR)  Patient Reported Information How did you hear about [place]? Self  Referral name: No data recorded Referral phone number: No data recorded  Whom do you see for routine medical problems? No data recorded Practice/Facility Name: No data  recorded Practice/Facility Phone Number: No data recorded Name of Contact: No data recorded Contact Number: No data recorded Contact Fax Number: No data recorded Prescriber Name: No data recorded Prescriber Address (if known): No data recorded  What Is the Reason for Your Visit/Call Today? Pt reports has not been able to get medications filled. Pt reports being scared at home living on his own now and seems to be stating that he has been talking with his deceased mother. Pt does endorse visual and auditory hallucinations. Reported to [facility] that he hasn't had medications in 2 wks.  How Long Has This Been Causing You Problems? > than 6 months  What Do You Feel Would Help You the Most Today? Treatment for Depression or other mood problem   Have You Recently Been in Any Inpatient Treatment (Hospital/Detox/Crisis Center/28-Day Program)? No data recorded Name/Location of Program/Hospital:No data recorded How Long Were You There? No data recorded When Were You Discharged? No data recorded  Have You Ever Received Services From [facility] Before? No data recorded Who Do You See at [facility]? No data recorded  Have You Recently Had Any Thoughts About Hurting Yourself? No  Are You Planning to Commit Suicide/Harm Yourself At This time? No   Have you Recently Had Thoughts About Hurting Someone Anthony Luna? No  Explanation: No data recorded  Have You Used Any Alcohol or Drugs in the Past 24 Hours? No  How Long Ago Did You Use Drugs or Alcohol? No data recorded What Did You Use and How Much? No data recorded  Do You Currently Have a Therapist/Psychiatrist? No  Name of Therapist/Psychiatrist: No data recorded  Have  You Been Recently Discharged From Any Office Practice or Programs? No  Explanation of Discharge From Practice/Program: No data recorded    CCA Screening Triage Referral Assessment Type of Contact: Face-to-Face  Is this Initial or Reassessment? No data recorded Date Telepsych  consult ordered in [facility]:  No data recorded Time Telepsych consult ordered in [facility]:  No data recorded  Patient Reported Information Reviewed? No data recorded Patient Left Without Being Seen? No data recorded Reason for Not Completing Assessment: No data recorded  Collateral Involvement: No data recorded  Does Patient Have a Court Appointed Legal Guardian? No data recorded Name and Contact of Legal Guardian: No data recorded If Minor and Not Living with Parent(s), Who has Custody? No data recorded Is CPS involved or ever been involved? Never  Is APS involved or ever been involved? Never   Patient Determined To Be At Risk for Harm To Self or Others Based on Review of Patient Reported Information or Presenting Complaint? No data recorded Method: No data recorded Availability of Means: No data recorded Intent: No data recorded Notification Required: No data recorded Additional Information for Danger to Others Potential: No data recorded Additional Comments for Danger to Others Potential: No data recorded Are There Guns or Other Weapons in Your Home? No data recorded Types of Guns/Weapons: No data recorded Are These Weapons Safely Secured?                            No data recorded Who Could Verify You Are Able To Have These Secured: No data recorded Do You Have any Outstanding Charges, Pending Court Dates, Parole/Probation? No data recorded Contacted To Inform of Risk of Harm To Self or Others: No data recorded  Location of Assessment: [facility] ED   Does Patient Present under Involuntary Commitment? Yes  IVC Papers Initial File Date: No data recorded  [place] of Residence: [place]   Patient Currently Receiving the Following Services: No data recorded  Determination of Need: Emergent (2 hours)   Options For Referral: No data recorded    CCA Biopsychosocial Intake/Chief Complaint:  No data recorded Current Symptoms/Problems: No data recorded  Patient Reported  Schizophrenia/Schizoaffective Diagnosis in Past: Yes   Strengths: Patient is able to communicate  Preferences: No data recorded Abilities: No data recorded  Type of Services Patient Feels are Needed: No data recorded  Initial Clinical Notes/Concerns: No data recorded  Mental Health Symptoms Depression:   Change in energy/activity; Difficulty Concentrating; Hopelessness   Duration of Depressive symptoms:  Greater than two weeks   Mania:   None   Anxiety:    Difficulty concentrating; Tension; Worrying   Psychosis:   Delusions; Hallucinations   Duration of Psychotic symptoms:  Greater than six months   Trauma:   None   Obsessions:   None   Compulsions:   None   Inattention:   None   Hyperactivity/Impulsivity:   None   Oppositional/Defiant Behaviors:   None   Emotional Irregularity:   None   Other Mood/Personality Symptoms:  No data recorded   Mental Status Exam Appearance and self-care  Stature:   Tall   Weight:   Average weight   Clothing:   Casual   Grooming:   Normal   Cosmetic use:   None   Posture/gait:   Normal   Motor activity:   Not Remarkable   Sensorium  Attention:   Distractible   Concentration:   Preoccupied   Orientation:  Person; Situation   Recall/memory:   Normal   Affect and Mood  Affect:   Anxious   Mood:   Anxious   Relating  Eye contact:   Staring   Facial expression:   Tense   Attitude toward examiner:   Guarded; Resistant; Suspicious   Thought and Language  Speech flow:  Mute   Thought content:   Suspicious   Preoccupation:   -- (UTA)   Hallucinations:   Auditory   Organization:  No data recorded  [employer] of Knowledge:   Fair   Intelligence:   Average   Abstraction:   Functional   Judgement:   Fair   Reality Testing:   Adequate   Insight:   Fair   Decision Making:   Normal   Social Functioning  Social Maturity:   Responsible   Social  Judgement:   Normal   Stress  Stressors:   -- Industrial/product designer)   Coping Ability:   Exhausted   Skill Deficits:   None   Supports:   -- Industrial/product designer)     Religion:    Leisure/Recreation:    Exercise/Diet:     CCA Employment/Education Employment/Work Situation:    Education:     CCA Family/Childhood History Family and Relationship History:    Childhood History:     Child/Adolescent Assessment:     CCA Substance Use Alcohol/Drug Use:                           ASAM's:  Six Dimensions of Multidimensional Assessment  Dimension 1:  Acute Intoxication and/or Withdrawal Potential:      Dimension 2:  Biomedical Conditions and Complications:      Dimension 3:  Emotional, Behavioral, or Cognitive Conditions and Complications:     Dimension 4:  Readiness to Change:     Dimension 5:  Relapse, Continued use, or Continued Problem Potential:     Dimension 6:  Recovery/Living Environment:     ASAM Severity Score:    ASAM Recommended Level of Treatment:     Substance use Disorder (SUD)    Recommendations for Services/Supports/Treatments:    DSM5 Diagnoses: Patient Active Problem List   Diagnosis Date Noted   Auditory hallucination 03/25/2023   Schizophrenia (HCC) 04/28/2022   Suicidal ideation 12/14/2018   Cannabis use disorder, severe, dependence (HCC) 11/30/2014   Schizoaffective disorder, bipolar type (HCC) 11/30/2014   Tobacco use disorder 11/30/2014    Mykala Mccready R Hayze Gazda, LCAS

## 2023-03-25 NOTE — ED Notes (Addendum)
Received a call from pt's [facility] representative, Juleen China @ [phone] .  Stated that he does not understand why patient has not received his medications in >2 weeks as he has services in place. Advised that he will be following this case and to reach out to him if needed.

## 2023-03-25 NOTE — ED Notes (Signed)
Pt given shower supplies 

## 2023-03-25 NOTE — ED Notes (Signed)
IVC/  PENDING  PLACEMENT 

## 2023-03-25 NOTE — ED Notes (Signed)
Breakfast provided.

## 2023-03-25 NOTE — ED Notes (Signed)
Moved to [employer] via wheelchair by Financial planner. Report to [place], [place]

## 2023-03-25 NOTE — ED Notes (Signed)
IVC/ Recommend Psychiatric inpatient hospitalization

## 2023-03-25 NOTE — ED Notes (Signed)
[place] [phone]) from [facility] campus called and stated that they will take pt tomorrow 03/26/23 after 10AM.  Call report to [phone]  Accepting provider is [facility]

## 2023-03-25 NOTE — Consult Note (Signed)
Psych ED Progress Note  03/25/2023 11:58 AM Fermin Yan  MRN:  [id number]   Method of visit?: Face to Face   Subjective:    Anthony Luna, 37 y.o., male patient seen via tele health by TTS and this provider; chart reviewed and consulted with Dr. Katrinka Blazing on 03/25/23.  Per chart review, ER physician states, pt has past medical history of significant for schizophrenia who presents to the emergency department with auditory and visual hallucinations.  Patient states that he has not been taking his medications for the past 1 week.   States that he is has been having hallucinations and feels like he does not have any organs.  Denies any SI or HI.  Patient has had prior inpatient psychiatric hospitalizations in the past.    Overall patient is psychotic.  He is tangential and disorganized, demonstrates thought blocking.  He is unsure why he is in the hospital but states that he sees himself in a casket and that his organs are missing.  He reports having auditory hallucinations that have worsened recently but does not elaborate further on this.  Reports having visual hallucinations but gives an incoherent response to description of this. Denies feeling depressed.  Reports feeling anxious.  Reports that sleep is up and down.  Reports that appetite is poor and that he has had nausea and vomiting for the last 5 weeks.  Denies any SI or HI. Reports that he stopped his medications a few weeks ago, but is unsure why.  Denies that he runs out of the medications or that he was having side effects to the medications. Reports having cigarettes, we discussed that this can affect his clozapine level if he was taking his clozapine. Reports smoking marijuana regularly.  Reports he has not had a bowel movement in 5 to 6 days.  Agreeable to start bowel regimen.   Per EMR, outpatient medications include clozapine, invega trinza, benztropine, Wellbutrin, clonazepam, Colace, melatonin   Principal Problem:  Schizophrenia (HCC) Diagnosis:  Principal Problem:   Schizophrenia (HCC) Active Problems:   Cannabis use disorder, severe, dependence (HCC)   Auditory hallucination  Total Time spent with patient: 15 minutes  Past Psychiatric History: Schizophrenia.  Past Medical History:  Past Medical History:  Diagnosis Date   Depression    Since moving into new apartment 1 year ago   None to low serum cortisol response with adrenocorticotrophic hormone (ACTH) stimulation test    Schizophrenia, paranoid type (HCC)    No past surgical history on file. Family History: No family history on file.  Social History:  Social History   Substance and Sexual Activity  Alcohol Use Yes   Alcohol/week: 0.0 standard drinks of alcohol   Comment: "Special occasions"     Social History   Substance and Sexual Activity  Drug Use Yes   Frequency: 4.0 times per week   Types: Marijuana   Comment: "2 blunts"    Social History   Socioeconomic History   Marital status: Single    Spouse name: Not on file   Number of children: Not on file   Years of education: Not on file   Highest education level: Not on file  Occupational History   Not on file  Tobacco Use   Smoking status: Every Day    Current packs/day: 0.50    Average packs/day: 0.5 packs/day for 20.3 years (10.2 ttl pk-yrs)    Types: Cigarettes    Start date: 11/29/2002   Smokeless tobacco: Former  Tobacco comments:    does not want to quit  Vaping Use   Vaping status: Never Used  Substance and Sexual Activity   Alcohol use: Yes    Alcohol/week: 0.0 standard drinks of alcohol    Comment: "Special occasions"   Drug use: Yes    Frequency: 4.0 times per week    Types: Marijuana    Comment: "2 blunts"   Sexual activity: Not on file  Other Topics Concern   Not on file  Social History Narrative   Not on file   Social Determinants of Health   Financial Resource Strain: Not on file  Food Insecurity: No Food Insecurity (04/28/2022)    Hunger Vital Sign    Worried About Running Out of Food in the Last Year: Never true    Ran Out of Food in the Last Year: Never true  Transportation Needs: No Transportation Needs (04/28/2022)   PRAPARE - Administrator, Civil Service (Medical): No    Lack of Transportation (Non-Medical): No  Physical Activity: Not on file  Stress: Not on file  Social Connections: Not on file      Current Medications: Current Facility-Administered Medications  Medication Dose Route Frequency Provider Last Rate Last Admin   haloperidol (HALDOL) tablet 5 mg  5 mg Oral TID PRN Lafonda Patron, Harrold Donath, MD       And   LORazepam (ATIVAN) tablet 2 mg  2 mg Oral TID PRN Phineas Inches, MD       And   diphenhydrAMINE (BENADRYL) capsule 50 mg  50 mg Oral TID PRN Fernande Treiber, Harrold Donath, MD       haloperidol lactate (HALDOL) injection 5 mg  5 mg Intramuscular TID PRN Jhony Antrim, Harrold Donath, MD       And   LORazepam (ATIVAN) injection 2 mg  2 mg Intramuscular TID PRN Jianni Shelden, Harrold Donath, MD       And   diphenhydrAMINE (BENADRYL) injection 50 mg  50 mg Intramuscular TID PRN Rease Wence, Harrold Donath, MD       docusate sodium (COLACE) capsule 100 mg  100 mg Oral BID Lowery Paullin, Harrold Donath, MD       hydrOXYzine (ATARAX) tablet 25 mg  25 mg Oral TID PRN Shayanna Thatch, Harrold Donath, MD       polyethylene glycol (MIRALAX / GLYCOLAX) packet 17 g  17 g Oral Daily Maykel Reitter, MD       traZODone (DESYREL) tablet 50 mg  50 mg Oral QHS PRN Delta Deshmukh, Harrold Donath, MD       Current Outpatient Medications  Medication Sig Dispense Refill   albuterol (VENTOLIN HFA) 108 (90 Base) MCG/ACT inhaler Inhale 1-2 puffs into the lungs every 6 (six) hours as needed for shortness of breath. 18 g 1   benztropine (COGENTIN) 0.5 MG tablet Take 2 tablets (1 mg total) by mouth 2 (two) times daily. 60 tablet 1   buPROPion (WELLBUTRIN XL) 150 MG 24 hr tablet Take 1 tablet (150 mg total) by mouth daily. 30 tablet 1   cetirizine (ZYRTEC) 10 MG tablet Take 1  tablet (10 mg total) by mouth daily. 30 tablet 1   clonazePAM (KLONOPIN) 0.5 MG tablet Take 1 tablet (0.5 mg total) by mouth 2 (two) times daily. 60 tablet 1   cloZAPine (CLOZARIL) 50 MG tablet Take 7 tablets (350 mg total) by mouth at bedtime. (Patient taking differently: Take 100 mg by mouth at bedtime.) 210 tablet 0   docusate sodium (COLACE) 100 MG capsule Take 1 capsule (100 mg total) by mouth 2 (  two) times daily. 60 capsule 1   fluticasone (FLONASE) 50 MCG/ACT nasal spray Place 2 sprays into both nostrils daily as needed for allergies or rhinitis. 16 g 1   hydrOXYzine (ATARAX) 25 MG tablet Take 1 tablet (25 mg total) by mouth 3 (three) times daily as needed for anxiety. 30 tablet 1   melatonin 5 MG TABS Take 1 tablet (5 mg total) by mouth at bedtime. (Patient taking differently: Take 3 mg by mouth at bedtime.) 30 tablet 1   montelukast (SINGULAIR) 10 MG tablet Take 1 tablet (10 mg total) by mouth at bedtime. 30 tablet 1   nicotine (NICODERM CQ - DOSED IN MG/24 HOURS) 21 mg/24hr patch Place 1 patch (21 mg total) onto the skin daily. 28 patch 1   Paliperidone Palmitate (INVEGA TRINZA) 819 MG/2.625ML SUSP Inject 819 mg into the muscle.     propranolol (INDERAL) 20 MG tablet Take 1 tablet (20 mg total) by mouth 2 (two) times daily. 60 tablet 1   Vitamin D, Ergocalciferol, (DRISDOL) 1.25 MG (50000 UNIT) CAPS capsule Take 1 capsule (50,000 Units total) by mouth every 7 (seven) days. 5 capsule 1    Lab Results:  Results for orders placed or performed during the hospital encounter of 03/24/23 (from the past 48 hour(s))  Comprehensive metabolic panel     Status: Abnormal   Collection Time: 03/24/23  9:36 PM  Result Value Ref Range   Sodium 138 135 - 145 mmol/L   Potassium 3.8 3.5 - 5.1 mmol/L   Chloride 102 98 - 111 mmol/L   CO2 24 22 - 32 mmol/L   Glucose, Bld 109 (H) 70 - 99 mg/dL    Comment: Glucose reference range applies only to samples taken after fasting for at least 8 hours.   BUN 14 6  - 20 mg/dL   Creatinine, Ser 6.21 0.61 - 1.24 mg/dL   Calcium 9.6 8.9 - 30.8 mg/dL   Total Protein 7.6 6.5 - 8.1 g/dL   Albumin 4.4 3.5 - 5.0 g/dL   AST 51 (H) 15 - 41 U/L   ALT 46 (H) 0 - 44 U/L   Alkaline Phosphatase 66 38 - 126 U/L   Total Bilirubin 1.0 0.3 - 1.2 mg/dL   GFR, Estimated >65 >78 mL/min    Comment: (NOTE) Calculated using the CKD-EPI Creatinine Equation (2021)    Anion gap 12 5 - 15    Comment: Performed at [facility], [address] [place], [place] [address]  Ethanol     Status: None   Collection Time: 03/24/23  9:36 PM  Result Value Ref Range   Alcohol, Ethyl (B) <10 <10 mg/dL    Comment: (NOTE) Lowest detectable limit for serum alcohol is 10 mg/dL.  For medical purposes only. Performed at [facility], [address] [place], [place] [address]   Salicylate level     Status: Abnormal   Collection Time: 03/24/23  9:36 PM  Result Value Ref Range   Salicylate Lvl <7.0 (L) 7.0 - 30.0 mg/dL    Comment: Performed at [facility], [address] [place], [place] [address]  Acetaminophen level     Status: Abnormal   Collection Time: 03/24/23  9:36 PM  Result Value Ref Range   Acetaminophen (Tylenol), Serum <10 (L) 10 - 30 ug/mL    Comment: (NOTE) Therapeutic concentrations vary significantly. A range of 10-30 ug/mL  may be an effective concentration for many patients. However, some  are best treated at concentrations  outside of this range. Acetaminophen concentrations >150 ug/mL at 4 hours after ingestion  and >50 ug/mL at 12 hours after ingestion are often associated with  toxic reactions.  Performed at [facility], [address] [place], [place] [address]   cbc     Status: None   Collection Time: 03/24/23  9:36 PM  Result Value Ref Range   WBC 10.1 4.0 - 10.5 K/uL   RBC 4.75 4.22 - 5.81 MIL/uL   Hemoglobin 14.8 13.0 - 17.0 g/dL   HCT 21.3 08.6 - 57.8 %   MCV 92.0 80.0 - 100.0 fL   MCH 31.2 26.0 - 34.0  pg   MCHC 33.9 30.0 - 36.0 g/dL   RDW 46.9 62.9 - 52.8 %   Platelets 212 150 - 400 K/uL   nRBC 0.0 0.0 - 0.2 %    Comment: Performed at [facility], [address], [place], [place] [address]    Blood Alcohol level:  Lab Results  Component Value Date   [facility] <10 03/24/2023   ETH <10 04/27/2022    Physical Findings: AIMS:  , ,  ,  ,    CIWA:    COWS:       Psychiatric Specialty Exam:  Presentation  General Appearance:  Disheveled  Eye Contact: Fair  Speech: Slow  Speech Volume: Normal  Handedness: Right   Mood and Affect  Mood: Anxious  Affect: Restricted   Thought Process  Thought Processes: Disorganized (blocking)  Descriptions of Associations:Tangential  Orientation:Partial  Thought Content:Illogical; Tangential; Delusions  History of Schizophrenia/Schizoaffective disorder:Yes  Duration of Psychotic Symptoms:Greater than six months  Hallucinations:Hallucinations: Auditory; Visual  Ideas of Reference:Delusions  Suicidal Thoughts:Suicidal Thoughts: No  Homicidal Thoughts:Homicidal Thoughts: No   Sensorium  Memory: Immediate Poor; Recent Poor; Remote Fair  Judgment: Impaired  Insight: Lacking   Executive Functions  Concentration: Poor  Attention Span: Poor  Recall: Poor  Fund of Knowledge: Poor  Language: Poor   Psychomotor Activity  Psychomotor Activity: Psychomotor Activity: Normal   Assets  Assets: Communication Skills; Desire for Improvement; Housing; Social Support   Sleep  Sleep: Sleep: Fair    Physical Exam: Physical Exam Constitutional:      General: He is not in acute distress.    Appearance: He is normal weight. He is not toxic-appearing.  Neurological:     Mental Status: He is alert.    Review of Systems  Gastrointestinal:  Positive for constipation.  Neurological:  Negative for dizziness, tingling, tremors and headaches.  Psychiatric/Behavioral:  Positive for  hallucinations. The patient is nervous/anxious.    Blood pressure 132/87, pulse 85, temperature 98.1 F (36.7 C), temperature source Oral, resp. rate 18, height 6' (1.829 m), weight 95.3 kg, SpO2 98%. Body mass index is 28.48 kg/m.  Treatment Plan Summary:  Assessment: Schizophrenia  Plan: Recommend admission to inpatient psychiatric unit [facility] IVC  Medication plan:  -Order CBC ED, before consideration of restarting clozapine.  Clozapine restart should be restarted within normal titration schedule with a history of the patient has been off his medication for a few weeks. -Order EKG -Start bowel regimen with Colace 100 mg twice daily and also 1 dose of MiraLAX 17 g  -Do not restart clozapine at this time -Do not restart the benztropine -Attempted to find out when last Invega Trinza dose was given -Hold off on restarting the clonazepam for now, if he does have some benzo withdrawal symptoms, clonazepam could be restarted  Ordered the agitation protocol with Haldol,  Ativan, and Benadryl as needed for agitation oral and IM  Start oxazine as needed for anxiety, and trazodone as needed for sleep   Cristy Hilts, MD 03/25/2023, 11:58 AM  Total Time Spent in Direct Patient Care:  I personally spent 35 minutes on the unit in direct patient care. The direct patient care time included face-to-face time with the patient, reviewing the patient's chart, communicating with other professionals, and coordinating care. Greater than 50% of this time was spent in counseling or coordinating care with the patient regarding goals of hospitalization, psycho-education, and discharge planning needs.   Phineas Inches, MD Psychiatrist

## 2023-03-25 NOTE — BH Assessment (Signed)
Per [facility] Danika, patient to be referred out of system.  Referral information for Psychiatric Hospitalization faxed to;   [facility] [phone]),   [facility] [phone])   [facility] [phone]--[phone])  [facility] [phone]) [phone]  [facility] [phone]),  [place] [phone] or [phone])  [address] [phone]),   [facility] [phone] -or- [phone]),   [facility] [phone] phone-- [phone])  [facility] Stab[facility] [phone]),  [address] [phone])  [place] [phone] -or- [phone]  [place] [phone])  [facility] [phone])

## 2023-03-26 NOTE — ED Provider Notes (Signed)
Patient is resting comfortably without distress.  Vitals:   03/25/23 0714 03/25/23 1942  BP: 132/87 (!) 140/78  Pulse: 85 (!) 112  Resp: 18 20  Temp: 98.1 F (36.7 C) 98.2 F (36.8 C)  SpO2: 98% 99%    Currently under involuntary commitment, awaiting psychiatric admission   Sharyn Creamer, MD 03/26/23 [phone]

## 2023-03-26 NOTE — ED Notes (Signed)
Patient was seen on camera peeing in cup. This RN and security went to check on patient. Patient admitted to peeing in cup and apologized saying "I'm sorry, my head is all messed up". This RN explained to patient that he has access to use the restroom and should refrain from peeing in the room or cup.

## 2023-03-26 NOTE — ED Notes (Signed)
Pt given breakfast.

## 2023-03-26 NOTE — ED Provider Notes (Signed)
Patient ambulatory in the room without distress.  Discussed with him plan for transfer to [facility] which she is agreeable with.  He is calm and appropriate and appears stable for transfer   Sharyn Creamer, MD 03/26/23 [phone]

## 2023-03-26 NOTE — ED Notes (Signed)
Report called. Spoke with RN Cyndia Bent. Call back number before patient is transported is [phone] (Option 2) ** Pager number - RN will call back.

## 2023-03-26 NOTE — ED Notes (Signed)
Pt walked to door of nurses' station and stated he threw up in a cup and showed this RN the contents. Green-bile colored in appearance. Pt denies any more nausea or abdominal pain at this time, will continue to monitor

## 2023-04-16 ENCOUNTER — Other Ambulatory Visit: Payer: Self-pay

## 2023-04-16 ENCOUNTER — Emergency Department
Admission: EM | Admit: 2023-04-16 | Discharge: 2023-04-17 | Disposition: A | Discharge status: Home / Self Care | Payer: MEDICAID | Attending: Emergency Medicine | Admitting: Emergency Medicine

## 2023-04-16 ENCOUNTER — Encounter: Payer: Self-pay | Admitting: Medical Oncology

## 2023-04-16 DIAGNOSIS — F419 Anxiety disorder, unspecified: Secondary | ICD-10-CM | POA: Insufficient documentation

## 2023-04-16 DIAGNOSIS — R443 Hallucinations, unspecified: Secondary | ICD-10-CM | POA: Insufficient documentation

## 2023-04-16 DIAGNOSIS — F17210 Nicotine dependence, cigarettes, uncomplicated: Secondary | ICD-10-CM | POA: Insufficient documentation

## 2023-04-16 DIAGNOSIS — F29 Unspecified psychosis not due to a substance or known physiological condition: Secondary | ICD-10-CM | POA: Diagnosis present

## 2023-04-16 DIAGNOSIS — F1910 Other psychoactive substance abuse, uncomplicated: Secondary | ICD-10-CM | POA: Insufficient documentation

## 2023-04-16 DIAGNOSIS — F22 Delusional disorders: Secondary | ICD-10-CM | POA: Diagnosis not present

## 2023-04-16 DIAGNOSIS — F250 Schizoaffective disorder, bipolar type: Secondary | ICD-10-CM

## 2023-04-16 DIAGNOSIS — F23 Brief psychotic disorder: Secondary | ICD-10-CM

## 2023-04-16 LAB — CBC
HCT: 41.4 % (ref 39.0–52.0)
Hemoglobin: 14.2 g/dL (ref 13.0–17.0)
MCH: 31.1 pg (ref 26.0–34.0)
MCHC: 34.3 g/dL (ref 30.0–36.0)
MCV: 90.8 fL (ref 80.0–100.0)
Platelets: 204 10*3/uL (ref 150–400)
RBC: 4.56 MIL/uL (ref 4.22–5.81)
RDW: 13.9 % (ref 11.5–15.5)
WBC: 9.1 10*3/uL (ref 4.0–10.5)
nRBC: 0 % (ref 0.0–0.2)

## 2023-04-16 LAB — SALICYLATE LEVEL: Salicylate Lvl: <7 mg/dL — ABNORMAL LOW (ref 7.0–30.0)

## 2023-04-16 LAB — COMPREHENSIVE METABOLIC PANEL
ALT: 29 U/L (ref 0–44)
AST: 33 U/L (ref 15–41)
Albumin: 4.1 g/dL (ref 3.5–5.0)
Alkaline Phosphatase: 53 U/L (ref 38–126)
Anion gap: 10 (ref 5–15)
BUN: 11 mg/dL (ref 6–20)
CO2: 24 mmol/L (ref 22–32)
Calcium: 9.1 mg/dL (ref 8.9–10.3)
Chloride: 106 mmol/L (ref 98–111)
Creatinine, Ser: 1.25 mg/dL — ABNORMAL HIGH (ref 0.61–1.24)
GFR, Estimated: >60 mL/min (ref >60)
Glucose, Bld: 90 mg/dL (ref 70–99)
Potassium: 3.5 mmol/L (ref 3.5–5.1)
Sodium: 140 mmol/L (ref 135–145)
Total Bilirubin: 0.7 mg/dL (ref 0.3–1.2)
Total Protein: 6.6 g/dL (ref 6.5–8.1)

## 2023-04-16 LAB — ETHANOL: Alcohol, Ethyl (B): <10 mg/dL (ref <10)

## 2023-04-16 LAB — ACETAMINOPHEN LEVEL: Acetaminophen (Tylenol), Serum: <10 ug/mL — ABNORMAL LOW (ref 10–30)

## 2023-04-16 MED ORDER — IBUPROFEN 600 MG PO TABS: 600.0000 mg | ORAL_TABLET | Freq: Three times a day (TID) | ORAL | Status: DC | PRN

## 2023-04-16 MED ORDER — ALUM & MAG HYDROXIDE-SIMETH 200-200-20 MG/5ML PO SUSP
30.0000 mL | Freq: Four times a day (QID) | ORAL | Status: DC | PRN
Start: 2023-04-16 — End: 2023-04-17

## 2023-04-16 MED ORDER — ONDANSETRON HCL 4 MG PO TABS: 4.0000 mg | ORAL_TABLET | Freq: Three times a day (TID) | ORAL | Status: DC | PRN

## 2023-04-16 MED ORDER — OLANZAPINE 5 MG PO TBDP
10.0000 mg | ORAL_TABLET | ORAL | Status: AC
  Administered 2023-04-16: 10 mg via ORAL
  Filled 2023-04-16: qty 2

## 2023-04-16 MED ORDER — LORAZEPAM 2 MG PO TABS
2.0000 mg | ORAL_TABLET | Freq: Once | ORAL | Status: AC
  Administered 2023-04-16: 2 mg via ORAL
  Filled 2023-04-16: qty 1

## 2023-04-16 NOTE — ED Notes (Signed)
Pt accepted at [facility]

## 2023-04-16 NOTE — ED Notes (Signed)
[facility] pending inpt psych

## 2023-04-16 NOTE — Consult Note (Signed)
[facility] Face-to-Face Psychiatry Consult   Reason for Consult:  Psychiatric Evaluation Referring Physician:  Sharman Cheek, MD  Patient Identification: Anthony Luna MRN:  [id number] Principal Diagnosis: <principal problem not specified> Diagnosis:  Active Problems:   * No active hospital problems. *   Total Time spent with patient and collateral: 45 minutes  Subjective:   [name] is a 37 y.o. male patient admitted with auditory hallucinations that tell him to kill people and eat them. "I want you too. You think I won't rip you wide open".  HPI:  Anthony Luna is a 37 y.o. male patient admitted with auditory hallucinations that tell him to kill people and eat them. Per his record, he was found in the road talking with himself prior to being transported to [facility] ED by [employer].   Patient has a history of schizoaffective disorder, bipolar type and per his sister, he was prescribed Seroquel and clozapine in the past. She reports speaking to patient yesterday, stating that he seemed erratic. Patient recently discharged from [facility] on 10/22/204. His sister reports that he was previously residing in group home living, prior to moving indepnently one month ago ("to see if he could live alone").   Past Psychiatric History: see above  Risk to Self:  no Risk to Others:  yes.  Prior Inpatient Therapy:  yes Prior Outpatient Therapy:  yes  Past Medical History:  Past Medical History:  Diagnosis Date   Depression    Since moving into new apartment 1 year ago   None to low serum cortisol response with adrenocorticotrophic hormone (ACTH) stimulation test    Schizophrenia, paranoid type (HCC)    History reviewed. No pertinent surgical history. Family History: History reviewed. No pertinent family history. Family Psychiatric  History: none reported Social History:  Social History   Substance and Sexual Activity  Alcohol Use Yes   Alcohol/week: 0.0 standard  drinks of alcohol   Comment: "Special occasions"     Social History   Substance and Sexual Activity  Drug Use Yes   Frequency: 4.0 times per week   Types: Marijuana   Comment: "2 blunts"    Social History   Socioeconomic History   Marital status: Single    Spouse name: Not on file   Number of children: Not on file   Years of education: Not on file   Highest education level: Not on file  Occupational History   Not on file  Tobacco Use   Smoking status: Every Day    Current packs/day: 0.50    Average packs/day: 0.5 packs/day for 20.4 years (10.2 ttl pk-yrs)    Types: Cigarettes    Start date: 11/29/2002   Smokeless tobacco: Former   Tobacco comments:    does not want to quit  Vaping Use   Vaping status: Never Used  Substance and Sexual Activity   Alcohol use: Yes    Alcohol/week: 0.0 standard drinks of alcohol    Comment: "Special occasions"   Drug use: Yes    Frequency: 4.0 times per week    Types: Marijuana    Comment: "2 blunts"   Sexual activity: Not on file  Other Topics Concern   Not on file  Social History Narrative   Not on file   Social Determinants of Health   Financial Resource Strain: Not on file  Food Insecurity: No Food Insecurity (04/28/2022)   Hunger Vital Sign    Worried About Running Out of Food in the Last  Year: Never true    Ran Out of Food in the Last Year: Never true  Transportation Needs: No Transportation Needs (04/28/2022)   PRAPARE - Administrator, Civil Service (Medical): No    Lack of Transportation (Non-Medical): No  Physical Activity: Not on file  Stress: Not on file  Social Connections: Not on file   Additional Social History:    Allergies:   Allergies  Allergen Reactions   Shellfish Allergy     Labs:  Results for orders placed or performed during the hospital encounter of 04/16/23 (from the past 48 hour(s))  Comprehensive metabolic panel     Status: Abnormal   Collection Time: 04/16/23 10:18 AM  Result  Value Ref Range   Sodium 140 135 - 145 mmol/L   Potassium 3.5 3.5 - 5.1 mmol/L   Chloride 106 98 - 111 mmol/L   CO2 24 22 - 32 mmol/L   Glucose, Bld 90 70 - 99 mg/dL    Comment: Glucose reference range applies only to samples taken after fasting for at least 8 hours.   BUN 11 6 - 20 mg/dL   Creatinine, Ser 7.82 (H) 0.61 - 1.24 mg/dL   Calcium 9.1 8.9 - 95.6 mg/dL   Total Protein 6.6 6.5 - 8.1 g/dL   Albumin 4.1 3.5 - 5.0 g/dL   AST 33 15 - 41 U/L   ALT 29 0 - 44 U/L   Alkaline Phosphatase 53 38 - 126 U/L   Total Bilirubin 0.7 0.3 - 1.2 mg/dL   GFR, Estimated >21 >30 mL/min    Comment: (NOTE) Calculated using the CKD-EPI Creatinine Equation (2021)    Anion gap 10 5 - 15    Comment: Performed at [facility], [address] [place], [place] [address]  Ethanol     Status: None   Collection Time: 04/16/23 10:18 AM  Result Value Ref Range   Alcohol, Ethyl (B) <10 <10 mg/dL    Comment: (NOTE) Lowest detectable limit for serum alcohol is 10 mg/dL.  For medical purposes only. Performed at [facility], [address] [place], [place] [address]   Salicylate level     Status: Abnormal   Collection Time: 04/16/23 10:18 AM  Result Value Ref Range   Salicylate Lvl <7.0 (L) 7.0 - 30.0 mg/dL    Comment: Performed at [facility], [address] [place], [place] [address]  Acetaminophen level     Status: Abnormal   Collection Time: 04/16/23 10:18 AM  Result Value Ref Range   Acetaminophen (Tylenol), Serum <10 (L) 10 - 30 ug/mL    Comment: (NOTE) Therapeutic concentrations vary significantly. A range of 10-30 ug/mL  may be an effective concentration for many patients. However, some  are best treated at concentrations outside of this range. Acetaminophen concentrations >150 ug/mL at 4 hours after ingestion  and >50 ug/mL at 12 hours after ingestion are often associated with  toxic reactions.  Performed at [facility], [address]  [place], [place] [address]   cbc     Status: None   Collection Time: 04/16/23 10:18 AM  Result Value Ref Range   WBC 9.1 4.0 - 10.5 K/uL   RBC 4.56 4.22 - 5.81 MIL/uL   Hemoglobin 14.2 13.0 - 17.0 g/dL   HCT 01.0 27.2 - 53.6 %   MCV 90.8 80.0 - 100.0 fL   MCH 31.1 26.0 - 34.0 pg   MCHC 34.3 30.0 - 36.0 g/dL   RDW  13.9 11.5 - 15.5 %   Platelets 204 150 - 400 K/uL   nRBC 0.0 0.0 - 0.2 %    Comment: Performed at [facility], [address] [place], [place] [address]    Current Facility-Administered Medications  Medication Dose Route Frequency Provider Last Rate Last Admin   alum & mag hydroxide-simeth (MAALOX/MYLANTA) 200-200-20 MG/5ML suspension 30 mL  30 mL Oral Q6H PRN Sharman Cheek, MD       ibuprofen (ADVIL) tablet 600 mg  600 mg Oral Q8H PRN Sharman Cheek, MD       ondansetron [facility]) tablet 4 mg  4 mg Oral Q8H PRN Sharman Cheek, MD       Current Outpatient Medications  Medication Sig Dispense Refill   cloZAPine (CLOZARIL) 100 MG tablet Take 100 mg by mouth daily.     albuterol (VENTOLIN HFA) 108 (90 Base) MCG/ACT inhaler Inhale 1-2 puffs into the lungs every 6 (six) hours as needed for shortness of breath. 18 g 1   benztropine (COGENTIN) 0.5 MG tablet Take 2 tablets (1 mg total) by mouth 2 (two) times daily. 60 tablet 1   buPROPion (WELLBUTRIN XL) 150 MG 24 hr tablet Take 1 tablet (150 mg total) by mouth daily. 30 tablet 1   cetirizine (ZYRTEC) 10 MG tablet Take 1 tablet (10 mg total) by mouth daily. 30 tablet 1   clonazePAM (KLONOPIN) 0.5 MG tablet Take 1 tablet (0.5 mg total) by mouth 2 (two) times daily. 60 tablet 1   cloZAPine (CLOZARIL) 50 MG tablet Take 7 tablets (350 mg total) by mouth at bedtime. (Patient taking differently: Take 100 mg by mouth at bedtime.) 210 tablet 0   docusate sodium (COLACE) 100 MG capsule Take 1 capsule (100 mg total) by mouth 2 (two) times daily. 60 capsule 1   fluticasone (FLONASE) 50 MCG/ACT nasal spray Place 2 sprays into  both nostrils daily as needed for allergies or rhinitis. 16 g 1   hydrOXYzine (ATARAX) 25 MG tablet Take 1 tablet (25 mg total) by mouth 3 (three) times daily as needed for anxiety. 30 tablet 1   melatonin 5 MG TABS Take 1 tablet (5 mg total) by mouth at bedtime. (Patient taking differently: Take 3 mg by mouth at bedtime.) 30 tablet 1   montelukast (SINGULAIR) 10 MG tablet Take 1 tablet (10 mg total) by mouth at bedtime. 30 tablet 1   nicotine (NICODERM CQ - DOSED IN MG/24 HOURS) 21 mg/24hr patch Place 1 patch (21 mg total) onto the skin daily. 28 patch 1   Paliperidone Palmitate (INVEGA TRINZA) 819 MG/2.625ML SUSP Inject 819 mg into the muscle.     propranolol (INDERAL) 20 MG tablet Take 1 tablet (20 mg total) by mouth 2 (two) times daily. 60 tablet 1   Vitamin D, Ergocalciferol, (DRISDOL) 1.25 MG (50000 UNIT) CAPS capsule Take 1 capsule (50,000 Units total) by mouth every 7 (seven) days. 5 capsule 1    Musculoskeletal: Strength & Muscle Tone: within normal limits Gait & Station: normal Patient leans: N/A            Psychiatric Specialty Exam:  Presentation  General Appearance:  Bizarre  Eye Contact: Good  Speech: Clear and Coherent  Speech Volume: Normal  Handedness: Right   Mood and Affect  Mood: Anxious  Affect: Restricted   Thought Process  Thought Processes: Disorganized  Descriptions of Associations:Tangential  Orientation:Partial  Thought Content:Illogical  History of Schizophrenia/Schizoaffective disorder:Yes  Duration of Psychotic Symptoms:Greater than six months  Hallucinations:Hallucinations:  Auditory  Ideas of Reference:Delusions  Suicidal Thoughts:Suicidal Thoughts: No  Homicidal Thoughts:Homicidal Thoughts: No   Sensorium  Memory: Immediate Poor; Recent Poor; Remote Poor  Judgment: Impaired  Insight: Lacking   Executive Functions  Concentration: Poor  Attention Span: Poor  Recall: Poor  Fund of  Knowledge: -- (utd)  Language: Good   Psychomotor Activity  Psychomotor Activity:Psychomotor Activity: Increased   Assets  Assets: Communication Skills   Sleep  Sleep:No data recorded  Physical Exam: Physical Exam Vitals and nursing note reviewed.  Neurological:     Mental Status: He is alert.    Review of Systems  Psychiatric/Behavioral:  Positive for hallucinations and substance abuse. The patient is nervous/anxious.   All other systems reviewed and are negative.  Blood pressure 118/88, pulse (!) 110, temperature 99.6 F (37.6 C), temperature source Oral, resp. rate 18, height 6' (1.829 m), weight 95 kg, SpO2 98%. Body mass index is 28.4 kg/m.  Treatment Plan Summary: Daily contact with patient to assess and evaluate symptoms and progress in treatment  Disposition: Recommend psychiatric Inpatient admission when medically cleared.  Mcneil Sober, NP 04/16/2023 4:38 PM

## 2023-04-16 NOTE — ED Notes (Signed)
Pt sitting calmly on the bed watching TV.

## 2023-04-16 NOTE — BH Assessment (Signed)
Per [facility] Tresa Endo S), patient to be referred out of system.   Referral information for Psychiatric Hospitalization faxed to;    [facility] [phone]), no available bed   [facility] [phone]- [phone]),    [facility] [phone]),   [place] [phone]--- [phone]--- [phone])   [address] [phone]),    [facility] [phone] -or- [phone]),    [facility] [phone]),   [facility] [phone])   [facility] [phone]).   [facility] [phone]).

## 2023-04-16 NOTE — ED Notes (Signed)
IVC accepted to [facility], no transport available until 11/1 am

## 2023-04-16 NOTE — ED Triage Notes (Signed)
Pt to ED via [employer] voluntarily- PD was called bc pt was walking in the street and talking erratic. Pt states that he is bipolar and schizophrenia.

## 2023-04-16 NOTE — ED Notes (Signed)
Pt yelling and banging on walls. EDP aware. Awaiting new orders

## 2023-04-16 NOTE — BH Assessment (Signed)
Comprehensive Clinical Assessment (CCA) Note  04/16/2023 Anthony Luna [id number]  Chief Complaint:  Chief Complaint  Patient presents with   Psychiatric Evaluation   Visit Diagnosis: Schizophrenia   Anthony Luna is a 37 year old male who presents to the ER via law enforcement, after he was found in the road talking with himself. He is having A/H, tat tell him to kill people, eat them and fly away. He admits to the use of alcohol and cannabis. During the interview the patient was liable and majority of his answers were appropriate to the questions.  CCA Screening, Triage and Referral (STR)  Patient Reported Information How did you hear about [place]? Legal System Mudlogger)  What Is the Reason for Your Visit/Call Today? Patient brought to the ER due to the odd and bizarre behaviors.  How Long Has This Been Causing You Problems? 1 wk - 1 month  What Do You Feel Would Help You the Most Today? Treatment for Depression or other mood problem; Alcohol or Drug Use Treatment   Have You Recently Had Any Thoughts About Hurting Yourself? No  Are You Planning to Commit Suicide/Harm Yourself At This time? No   Flowsheet Row ED from [facility]/16/2023 in [facility] Emergency Department at [facility] ED from 03/24/2023 in [facility] Emergency Department at [facility] Admission (Discharged) from 04/28/2022 in [facility] INPATIENT BEHAVIORAL MEDICINE  C-SSRS RISK CATEGORY Error: Q3, 4, or 5 should not be populated when Q2 is No High Risk No Risk       Have you Recently Had Thoughts About Hurting Someone Anthony Luna? Yes  Are You Planning to Harm Someone at This Time? No  Explanation: No data recorded  Have You Used Any Alcohol or Drugs in the Past 24 Hours? Yes  What Did You Use and How Much? Alcohol and cannabis   Do You Currently Have a Therapist/Psychiatrist? No  Name of Therapist/Psychiatrist:    Have You Been Recently Discharged From Any Office Practice or Programs?  No  Explanation of Discharge From Practice/Program: n/a     CCA Screening Triage Referral Assessment Type of Contact: Face-to-Face  Telemedicine Service Delivery:   Is this Initial or Reassessment?   Date Telepsych consult ordered in [facility]:    Time Telepsych consult ordered in [facility]:    [facility]  Provider Location[facility] ED   Collateral Involvement: None provided   Does Patient Have a Court Appointed Legal Guardian? No  Legal Guardian Contact Information: n/a  Copy of Legal Guardianship Form: -- (n/a)  Legal Guardian Notified of Arrival: -- (n/a)  Legal Guardian Notified of Pending Discharge: -- (n/a)  If Minor and Not Living with Parent(s), Who has Custody? n/a  Is CPS involved or ever been involved? Never  Is APS involved or ever been involved? Never   Patient Determined To Be At Risk for Harm To Self or Others Based on Review of Patient Reported Information or Presenting Complaint? No  Method: No Plan  Availability of Means: No access or NA  Intent: Vague intent or NA  Notification Required: No need or identified person  Additional Information for Danger to Others Potential: Active psychosis  Additional Comments for Danger to Others Potential: n/a  Are There Guns or Other Weapons in Your Home? No  Types of Guns/Weapons: n/a  Are These Weapons Safely Secured?  No  Who Could Verify You Are Able To Have These Secured: n/a  Do You Have any Outstanding Charges, Pending Court Dates, Parole/Probation? None reported  Contacted To Inform of Risk of Harm To Self or Others: Other: Comment    Does Patient Present under Involuntary Commitment? Yes    [place] of Residence: [place]   Patient Currently Receiving the Following Services: Not Receiving Services   Determination of Need: Emergent (2 hours)   Options For Referral: ED Visit   CCA Biopsychosocial Patient Reported Schizophrenia/Schizoaffective  Diagnosis in Past: Yes   Strengths: Have a support system, have stable housing and some insight.   Mental Health Symptoms Depression:   Change in energy/activity; Irritability; Worthlessness   Duration of Depressive symptoms:  Duration of Depressive Symptoms: Less than two weeks   Mania:   Increased Energy; Irritability; Racing thoughts; Overconfidence; Recklessness   Anxiety:    Irritability; Restlessness   Psychosis:   Other negative symptoms   Duration of Psychotic symptoms:  Duration of Psychotic Symptoms: Greater than six months   Trauma:   N/A   Obsessions:   N/A   Compulsions:   N/A   Inattention:   N/A   Hyperactivity/Impulsivity:   N/A   Oppositional/Defiant Behaviors:   Easily annoyed   Emotional Irregularity:   N/A   Other Mood/Personality Symptoms:  No data recorded   Mental Status Exam Appearance and self-care  Stature:   Average   Weight:   Average weight   Clothing:   Neat/clean; Age-appropriate   Grooming:   Normal   Cosmetic use:   None   Posture/gait:   Normal   Motor activity:   -- (Within normal range)   Sensorium  Attention:   Distractible; Confused   Concentration:   Preoccupied   Orientation:   Person   Recall/memory:   Defective in Remote; Defective in Recent; Defective in Short-term; Defective in Immediate   Affect and Mood  Affect:   Appropriate; Depressed   Mood:   Irritable   Relating  Eye contact:   Normal   Facial expression:   Responsive   Attitude toward examiner:   Suspicious; Critical   Thought and Language  Speech flow:  Pressured; Flight of Ideas   Thought content:   Appropriate to Mood and Circumstances   Preoccupation:   None   Hallucinations:   None   Organization:   Disorganized   Company secretary of Knowledge:   Fair   Intelligence:   Average   Abstraction:   Functional   Judgement:   Impaired   Reality Testing:   Distorted   Insight:    Poor   Decision Making:   Impulsive   Social Functioning  Social Maturity:   Impulsive; Irresponsible   Social Judgement:   "Chief of Staff"; Heedless   Stress  Stressors:   Transitions   Coping Ability:   Exhausted; Overwhelmed   Skill Deficits:   Decision making   Supports:   Family     Religion: Religion/Spirituality Are You A Religious Person?: No  Leisure/Recreation: Leisure / Recreation Do You Have Hobbies?: No  Exercise/Diet: Exercise/Diet Do You Exercise?: No Have You Gained or Lost A Significant Amount of Weight in the Past Six Months?: No Do You Follow a Special Diet?: No Do You Have Any Trouble Sleeping?: No   CCA Employment/Education Employment/Work Situation: 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?: No  Education: Education  Is Patient Currently Attending School?: No Did You Attend College?: No Did You Have An Individualized Education Program (IIEP): No Did You Have Any Difficulty At School?: No Patient's Education Has Been Impacted by Current Illness: No   CCA Family/Childhood History Family and Relationship History: Family history Marital status: Single Does patient have children?: No  Childhood History:  Childhood History By whom was/is the patient raised?: Mother Did patient suffer any verbal/emotional/physical/sexual abuse as a child?: No Did patient suffer from severe childhood neglect?: No Has patient ever been sexually abused/assaulted/raped as an adolescent or adult?: No Was the patient ever a victim of a crime or a disaster?: No Spoken with a professional about abuse?: No Does patient feel these issues are resolved?: No Witnessed domestic violence?: No Has patient been affected by domestic violence as an adult?: No   CCA Substance Use Alcohol/Drug Use:    ASAM's:  Six Dimensions of Multidimensional Assessment  Dimension 1:  Acute  Intoxication and/or Withdrawal Potential:      Dimension 2:  Biomedical Conditions and Complications:      Dimension 3:  Emotional, Behavioral, or Cognitive Conditions and Complications:     Dimension 4:  Readiness to Change:     Dimension 5:  Relapse, Continued use, or Continued Problem Potential:     Dimension 6:  Recovery/Living Environment:     ASAM Severity Score:    ASAM Recommended Level of Treatment:     Substance use Disorder (SUD)   Recommendations for Services/Supports/Treatments:   Discharge Disposition:   DSM5 Diagnoses: Patient Active Problem List   Diagnosis Date Noted   Auditory hallucination 03/25/2023   Schizophrenia (HCC) 04/28/2022   Suicidal ideation 12/14/2018   Cannabis use disorder, severe, dependence (HCC) 11/30/2014   Schizoaffective disorder, bipolar type (HCC) 11/30/2014   Tobacco use disorder 11/30/2014    Referrals to Alternative Service(s): Referred to Alternative Service(s):   Place:   Date:   Time:    Referred to Alternative Service(s):   Place:   Date:   Time:    Referred to Alternative Service(s):   Place:   Date:   Time:    Referred to Alternative Service(s):   Place:   Date:   Time:     Lilyan Gilford MS, LCAS, [facility], [facility] Therapeutic Triage Specialist 04/16/2023 1:14 PM

## 2023-04-16 NOTE — ED Notes (Signed)
Pt noted to be talking to self in room. Pt appears to be turning head and talking to someone that is not physically in room. Pt calm and cooperative at this time with staff.

## 2023-04-16 NOTE — BH Assessment (Signed)
Patient has been accepted to [facility].  Patient assigned to [facility] Accepting physician is Dr. Hewitt Shorts.  Call report to [phone].  Representative was [place].    ER Staff is aware of it:  Luane, ER Secretary  Florentina Addison, Patient's Nurse     Address: [address] [place], [place] [address]  If he is unable to transport tonight, the bed will be available tomorrow (04/17/2023).

## 2023-04-16 NOTE — ED Notes (Signed)
Pt singing and dancing in the doorway.

## 2023-04-16 NOTE — ED Notes (Signed)
IVC PENDING  CONSULT ?

## 2023-04-16 NOTE — ED Provider Notes (Signed)
[facility] Provider Note    Event Date/Time   First MD Initiated Contact with Patient 04/16/23 1033     (approximate)   History   Chief Complaint: Psychiatric Evaluation   HPI  Anthony Luna is a 37 y.o. male with a history of schizophrenia who comes to the ED reporting auditory hallucinations that tell him to kill people and eat them and then fly away.  He reports alcohol and drug use last night.  Also feels paranoid.  When asked if he is taking any medications he says "yes, Nabisco."     Physical Exam   Triage Vital Signs: ED Triage Vitals  Encounter Vitals Group     BP 04/16/23 1024 118/88     Systolic BP Percentile --      Diastolic BP Percentile --      Pulse Rate 04/16/23 1024 (!) 110     Resp 04/16/23 1024 18     Temp 04/16/23 1024 99.6 F (37.6 C)     Temp Source 04/16/23 1024 Oral     SpO2 04/16/23 1024 98 %     Weight 04/16/23 1016 209 lb 7 oz (95 kg)     Height 04/16/23 1016 6' (1.829 m)     Head Circumference --      Peak Flow --      Pain Score 04/16/23 1016 0     Pain Loc --      Pain Education --      Exclude from Growth Chart --     Most recent vital signs: Vitals:   04/16/23 1024  BP: 118/88  Pulse: (!) 110  Resp: 18  Temp: 99.6 F (37.6 C)  SpO2: 98%    General: Awake, no distress. CV:  Good peripheral perfusion.  Resp:  Normal effort.  Abd:  No distention.  Other:  No wounds   ED Results / Procedures / Treatments   Labs (all labs ordered are listed, but only abnormal results are displayed) Labs Reviewed  CBC  COMPREHENSIVE METABOLIC PANEL  ETHANOL  SALICYLATE LEVEL  ACETAMINOPHEN LEVEL  URINE DRUG SCREEN, QUALITATIVE ([facility] ONLY)     EKG    RADIOLOGY    PROCEDURES:  Procedures   MEDICATIONS ORDERED IN ED: Medications  ibuprofen (ADVIL) tablet 600 mg (has no administration in time range)  ondansetron (ZOFRAN) tablet 4 mg (has no administration in time range)  alum & mag  hydroxide-simeth (MAALOX/MYLANTA) 200-200-20 MG/5ML suspension 30 mL (has no administration in time range)     IMPRESSION / MDM / ASSESSMENT AND PLAN / ED COURSE  I reviewed the triage vital signs and the nursing notes.  Patient's presentation is most consistent with acute presentation with potential threat to life or bodily function.  Pt p/w acute psychosis, possibly related to polysubstance abuse. Will need to IVC for safety pending psych eval.  The patient has been placed in psychiatric observation due to the need to provide a safe environment for the patient while obtaining psychiatric consultation and evaluation, as well as ongoing medical and medication management to treat the patient's condition.  The patient has been placed under full IVC at this time.      FINAL CLINICAL IMPRESSION(S) / ED DIAGNOSES   Final diagnoses:  Acute psychosis (HCC)     Rx / DC Orders   ED Discharge Orders     None        Note:  This document was prepared using Dragon voice recognition software  and may include unintentional dictation errors.   Sharman Cheek, MD 04/16/23 1053

## 2023-04-17 NOTE — ED Notes (Signed)
Pt given ice water as requested °

## 2023-04-17 NOTE — ED Notes (Signed)
IVC/Patient has been accepted to [facility].  Patient assigned to [facility] Accepting physician is Dr. Hewitt Shorts.  If he is unable to transport on 04/16/23, the bed will be available tomorrow (04/17/2023).

## 2023-04-27 ENCOUNTER — Other Ambulatory Visit: Payer: Self-pay

## 2023-04-27 ENCOUNTER — Emergency Department
Admission: EM | Admit: 2023-04-27 | Discharge: 2023-04-29 | Disposition: A | Discharge status: Home / Self Care | Payer: MEDICAID | Attending: Emergency Medicine | Admitting: Emergency Medicine

## 2023-04-27 DIAGNOSIS — F250 Schizoaffective disorder, bipolar type: Secondary | ICD-10-CM | POA: Insufficient documentation

## 2023-04-27 DIAGNOSIS — F17210 Nicotine dependence, cigarettes, uncomplicated: Secondary | ICD-10-CM | POA: Insufficient documentation

## 2023-04-27 DIAGNOSIS — F209 Schizophrenia, unspecified: Secondary | ICD-10-CM

## 2023-04-27 DIAGNOSIS — R4689 Other symptoms and signs involving appearance and behavior: Secondary | ICD-10-CM | POA: Diagnosis present

## 2023-04-27 LAB — URINE DRUG SCREEN, QUALITATIVE (ARMC ONLY)
Amphetamines, Ur Screen: NOT DETECTED
Barbiturates, Ur Screen: NOT DETECTED
Benzodiazepine, Ur Scrn: NOT DETECTED
Cannabinoid 50 Ng, Ur ~~LOC~~: POSITIVE — AB
Cocaine Metabolite,Ur ~~LOC~~: NOT DETECTED
MDMA (Ecstasy)Ur Screen: NOT DETECTED
Methadone Scn, Ur: NOT DETECTED
Opiate, Ur Screen: NOT DETECTED
Phencyclidine (PCP) Ur S: NOT DETECTED
Tricyclic, Ur Screen: POSITIVE — AB

## 2023-04-27 LAB — ACETAMINOPHEN LEVEL: Acetaminophen (Tylenol), Serum: <10 ug/mL — ABNORMAL LOW (ref 10–30)

## 2023-04-27 LAB — COMPREHENSIVE METABOLIC PANEL
ALT: 47 U/L — ABNORMAL HIGH (ref 0–44)
AST: 74 U/L — ABNORMAL HIGH (ref 15–41)
Albumin: 4.2 g/dL (ref 3.5–5.0)
Alkaline Phosphatase: 65 U/L (ref 38–126)
Anion gap: 7 (ref 5–15)
BUN: 17 mg/dL (ref 6–20)
CO2: 24 mmol/L (ref 22–32)
Calcium: 8.8 mg/dL — ABNORMAL LOW (ref 8.9–10.3)
Chloride: 107 mmol/L (ref 98–111)
Creatinine, Ser: 1.2 mg/dL (ref 0.61–1.24)
GFR, Estimated: >60 mL/min (ref >60)
Glucose, Bld: 94 mg/dL (ref 70–99)
Potassium: 4.3 mmol/L (ref 3.5–5.1)
Sodium: 138 mmol/L (ref 135–145)
Total Bilirubin: 0.7 mg/dL (ref <1.2)
Total Protein: 7.4 g/dL (ref 6.5–8.1)

## 2023-04-27 LAB — CBC
HCT: 42.9 % (ref 39.0–52.0)
Hemoglobin: 14.3 g/dL (ref 13.0–17.0)
MCH: 31.7 pg (ref 26.0–34.0)
MCHC: 33.3 g/dL (ref 30.0–36.0)
MCV: 95.1 fL (ref 80.0–100.0)
Platelets: 207 10*3/uL (ref 150–400)
RBC: 4.51 MIL/uL (ref 4.22–5.81)
RDW: 13.7 % (ref 11.5–15.5)
WBC: 10.7 10*3/uL — ABNORMAL HIGH (ref 4.0–10.5)
nRBC: 0 % (ref 0.0–0.2)

## 2023-04-27 LAB — SALICYLATE LEVEL: Salicylate Lvl: <7 mg/dL — ABNORMAL LOW (ref 7.0–30.0)

## 2023-04-27 LAB — ETHANOL: Alcohol, Ethyl (B): <10 mg/dL (ref <10)

## 2023-04-27 NOTE — ED Notes (Signed)
Pt belongings:  Black shoes Black pants Black socks Blue shirt

## 2023-04-27 NOTE — ED Triage Notes (Addendum)
Pt to ED with [employer] voluntarily, Pt states it is cold outside and he has no food or money, pt denies SI denies hearing voices or seeing things but states he would like to speak to psychiatrist while he is here. Pt has hx schizophrenia, states he has been taking prescribed medications. Pt talking erratic  in triage but is calm and cooperative. Pt yelling out random numbers during triage process and asking how to spell the letter "E" pt will begin talking and stop mid sentence and stare straight forward. Pt states he saw scream 7 last night and since has felt like someone is out to get him. Pt licking his own blood in triage.

## 2023-04-27 NOTE — ED Notes (Signed)
Pt given PM snack.  

## 2023-04-27 NOTE — ED Provider Notes (Signed)
[facility] Provider Note    Event Date/Time   First MD Initiated Contact with Patient 04/27/23 1941     (approximate)   History   Psychiatric Evaluation   HPI Anthony Luna is a 37 y.o. male with history of schizophrenia presenting today for psychiatric evaluation.  Patient was brought in voluntarily by the police department.  They noted he has been having erratic behavior and yelling random things.  He states he feels like people are following him and also notes visual and auditory hallucinations.  Denies SI or HI at this time.  Reportedly taking all his medications as prescribed.  Denies any other acute symptoms or concerns at this time.  Chart review: Multiple prior evaluations for acute psychosis.     Physical Exam   Triage Vital Signs: ED Triage Vitals  Encounter Vitals Group     BP 04/27/23 1925 123/73     Systolic BP Percentile --      Diastolic BP Percentile --      Pulse Rate 04/27/23 1925 97     Resp 04/27/23 1925 16     Temp 04/27/23 1925 98.7 F (37.1 C)     Temp Source 04/27/23 1925 Oral     SpO2 04/27/23 1925 96 %     Weight 04/27/23 1931 181 lb (82.1 kg)     Height 04/27/23 1931 6' (1.829 m)     Head Circumference --      Peak Flow --      Pain Score 04/27/23 1931 0     Pain Loc --      Pain Education --      Exclude from Growth Chart --     Most recent vital signs: Vitals:   04/27/23 1925  BP: 123/73  Pulse: 97  Resp: 16  Temp: 98.7 F (37.1 C)  SpO2: 96%   I have reviewed the vital signs. General:  Awake, alert, no acute distress. Head:  Normocephalic, Atraumatic. EENT:  PERRL, EOMI, Oral mucosa pink and moist, Neck is supple. Cardiovascular: Regular rate, 2+ distal pulses. Respiratory:  Normal respiratory effort, symmetrical expansion, no distress.   Extremities:  Moving all four extremities through full ROM without pain.   Neuro:  Alert and oriented.  Interacting appropriately.   Skin:  Warm, dry, no  rash.   Psych: Evasive on questioning.  Denies SI or HI.  Endorses paranoia and hallucinations.   ED Results / Procedures / Treatments   Labs (all labs ordered are listed, but only abnormal results are displayed) Labs Reviewed  COMPREHENSIVE METABOLIC PANEL - Abnormal; Notable for the following components:      Result Value   Calcium 8.8 (*)    AST 74 (*)    ALT 47 (*)    All other components within normal limits  SALICYLATE LEVEL - Abnormal; Notable for the following components:   Salicylate Lvl <7.0 (*)    All other components within normal limits  ACETAMINOPHEN LEVEL - Abnormal; Notable for the following components:   Acetaminophen (Tylenol), Serum <10 (*)    All other components within normal limits  CBC - Abnormal; Notable for the following components:   WBC 10.7 (*)    All other components within normal limits  URINE DRUG SCREEN, QUALITATIVE ([facility] ONLY) - Abnormal; Notable for the following components:   Tricyclic, Ur Screen POSITIVE (*)    Cannabinoid 50 Ng, Ur [place] POSITIVE (*)    All other components within normal limits  ETHANOL  EKG    RADIOLOGY    PROCEDURES:  Critical Care performed: No  Procedures   MEDICATIONS ORDERED IN ED: Medications - No data to display   IMPRESSION / MDM / ASSESSMENT AND PLAN / ED COURSE  I reviewed the triage vital signs and the nursing notes.                              Differential diagnosis includes, but is not limited to, acute psychosis  Patient's presentation is most consistent with acute complicated illness / injury requiring diagnostic workup.  Patient is a 37 year old male presenting today for psychiatric evaluation.  History of schizophrenia and symptoms today include paranoia, tangential thought, erratic behavior concerning for acute psychosis.  Patient was medically cleared and psychiatry was consulted.  Patient signed out to oncoming provider pending psychiatric evaluation.  The patient has been placed  in psychiatric observation due to the need to provide a safe environment for the patient while obtaining psychiatric consultation and evaluation, as well as ongoing medical and medication management to treat the patient's condition.  The patient has not been placed under full IVC at this time.      FINAL CLINICAL IMPRESSION(S) / ED DIAGNOSES   Final diagnoses:  Schizophrenia, unspecified type (HCC)     Rx / DC Orders   ED Discharge Orders     None        Note:  This document was prepared using Dragon voice recognition software and may include unintentional dictation errors.   Janith Lima, MD 04/27/23 [phone]

## 2023-04-27 NOTE — BH Assessment (Signed)
Comprehensive Clinical Assessment (CCA) Screening, Triage and Referral Note  04/27/2023 Ruby Wheelhouse [id number] Recommendations for Services/Supports/Treatments: Consulted with Geralynn Ochs, NP, who recommended pt. for continued observation and reassessment in the AM.   [name] is a 37 year old, English speaking, Black male with a hx of Schizoaffective Disorder, Bipolar Type and cannabis use disorder, severe dependence. Pt presented to [facility] ED under VOL. Per triage note: Pt to ED with [employer] voluntarily, Pt states it is cold outside and he has no food or money, pt denies SI denies hearing voices or seeing things but states he would like to speak to psychiatrist while he is here. Pt has hx schizophrenia, states he has been taking prescribed medications. Pt talking erratic in triage but is calm and cooperative. Pt yelling out random numbers during triage process and asking how to spell the letter "E" pt will begin talking and stop mid-sentence and stare straight forward. Pt states he saw scream 7 last night and since has felt like someone is out to get him. Pt licking his own blood in triage.       On assessment, pt. presented with loose, tangential speech. Pt's thoughts were irrelevant and patient was disorganized. When asked what brought pt. to the ED the pt. began perseverating about needing to get off of the streets and not understanding why people are "so hateful/unkind". Pt had no insight and poor judgment. Pt was impulsive and had impaired self-control. Of note, pt. observed refusing to remain in designated area and had restless psychomotor activity. Pt had a euphoric affect, and a hypomanic mood. Pt had a silly attitude towards examiners. Pt had poor concentration and was easily distracted. Pt denied current SI, HI. Pt endorsed having AV/H explaining that he hears things and sees little green men, artwork, and go carts. Pt noted to laugh inappropriately. BAL is unremarkable; UDS + for  cannabis. Pt admitted to cannabis use and reported that he is medication compliant.    Chief Complaint:  Chief Complaint  Patient presents with   Psychiatric Evaluation   Visit Diagnosis: Schizophrenia (HCC) Active Problems:   Cannabis use disorder, severe, dependence (HCC)   Auditory hallucination  Patient Reported Information How did you hear about [place]? [employer])  What Is the Reason for Your Visit/Call Today? Patient brought to the ER due to the odd and bizarre behaviors.  How Long Has This Been Causing You Problems? 1 wk - 1 month  What Do You Feel Would Help You the Most Today? Treatment for Depression or other mood problem; Alcohol or Drug Use Treatment   Have You Recently Had Any Thoughts About Hurting Yourself? No  Are You Planning to Commit Suicide/Harm Yourself At This time? No   Have you Recently Had Thoughts About Hurting Someone [name]? Yes  Are You Planning to Harm Someone at This Time? No  Explanation: No data recorded  Have You Used Any Alcohol or Drugs in the Past 24 Hours? Yes  How Long Ago Did You Use Drugs or Alcohol? No data recorded What Did You Use and How Much? Alcohol and cannabis   Do You Currently Have a Therapist/Psychiatrist? No  Name of Therapist/Psychiatrist: Unable to recall   Have You Been Recently Discharged From Any Office Practice or Programs? No  Explanation of Discharge From Practice/Program: n/a    CCA Screening Triage Referral Assessment Type of Contact: Face-to-Face  Telemedicine Service Delivery:   Is this Initial or Reassessment?   Date Telepsych consult ordered in [facility]:  Time Telepsych consult ordered in [facility]:    Location of Assessment: [facility] ED  Provider Location: [facility] ED    Collateral Involvement: None provided   Does Patient Have a Court Appointed Legal Guardian? No data recorded Name and Contact of Legal Guardian: No data recorded If Minor and Not Living with Parent(s), Who has Custody?  n/a  Is CPS involved or ever been involved? Never  Is APS involved or ever been involved? Never   Patient Determined To Be At Risk for Harm To Self or Others Based on Review of Patient Reported Information or Presenting Complaint? No  Method: No Plan  Availability of Means: No access or NA  Intent: Vague intent or NA  Notification Required: No need or identified person  Additional Information for Danger to Others Potential: Active psychosis  Additional Comments for Danger to Others Potential: n/a  Are There Guns or Other Weapons in Your Home? No  Types of Guns/Weapons: n/a  Are These Weapons Safely Secured?                            No  Who Could Verify You Are Able To Have These Secured: n/a  Do You Have any Outstanding Charges, Pending Court Dates, Parole/Probation? None reported  Contacted To Inform of Risk of Harm To Self or Others: Other: Comment   Does Patient Present under Involuntary Commitment? Yes    [place] of Residence: [place]   Patient Currently Receiving the Following Services: Not Receiving Services   Determination of Need: Emergent (2 hours)   Options For Referral: ED Visit   Discharge Disposition:     [place] Canty R Dameian Crisman, LCAS

## 2023-04-28 DIAGNOSIS — F250 Schizoaffective disorder, bipolar type: Secondary | ICD-10-CM

## 2023-04-28 LAB — CBC WITH DIFFERENTIAL/PLATELET
Abs Immature Granulocytes: 0.02 10*3/uL (ref 0.00–0.07)
Basophils Absolute: 0 10*3/uL (ref 0.0–0.1)
Basophils Relative: 0 %
Eosinophils Absolute: 0.3 10*3/uL (ref 0.0–0.5)
Eosinophils Relative: 3 %
HCT: 43.5 % (ref 39.0–52.0)
Hemoglobin: 14.3 g/dL (ref 13.0–17.0)
Immature Granulocytes: 0 %
Lymphocytes Relative: 21 %
Lymphs Abs: 2.1 10*3/uL (ref 0.7–4.0)
MCH: 31.3 pg (ref 26.0–34.0)
MCHC: 32.9 g/dL (ref 30.0–36.0)
MCV: 95.2 fL (ref 80.0–100.0)
Monocytes Absolute: 1.5 10*3/uL — ABNORMAL HIGH (ref 0.1–1.0)
Monocytes Relative: 15 %
Neutro Abs: 5.8 10*3/uL (ref 1.7–7.7)
Neutrophils Relative %: 61 %
Platelets: 204 10*3/uL (ref 150–400)
RBC: 4.57 MIL/uL (ref 4.22–5.81)
RDW: 13.7 % (ref 11.5–15.5)
WBC: 9.7 10*3/uL (ref 4.0–10.5)
nRBC: 0 % (ref 0.0–0.2)

## 2023-04-28 MED ORDER — CLOZAPINE 100 MG PO TABS
300.0000 mg | ORAL_TABLET | Freq: Every day | ORAL | Status: DC
  Administered 2023-04-28 (×2): 300 mg via ORAL
  Filled 2023-04-28 (×2): qty 3

## 2023-04-28 MED ORDER — HYDROXYZINE HCL 25 MG PO TABS: 25.0000 mg | ORAL_TABLET | Freq: Three times a day (TID) | ORAL | Status: DC | PRN

## 2023-04-28 MED ORDER — BENZTROPINE MESYLATE 1 MG PO TABS
1.0000 mg | ORAL_TABLET | Freq: Two times a day (BID) | ORAL | Status: DC
  Administered 2023-04-28 – 2023-04-29 (×3): 1 mg via ORAL
  Filled 2023-04-28 (×4): qty 1

## 2023-04-28 MED ORDER — LORAZEPAM 1 MG PO TABS: 1.0000 mg | ORAL_TABLET | ORAL | Status: DC | PRN

## 2023-04-28 MED ORDER — OLANZAPINE 10 MG PO TBDP
5.0000 mg | ORAL_TABLET | Freq: Three times a day (TID) | ORAL | Status: DC | PRN
  Administered 2023-04-29: 5 mg via ORAL
  Filled 2023-04-28: qty 1

## 2023-04-28 MED ORDER — LORAZEPAM 1 MG PO TABS
1.0000 mg | ORAL_TABLET | Freq: Once | ORAL | Status: AC
  Administered 2023-04-28: 1 mg via ORAL
  Filled 2023-04-28: qty 1

## 2023-04-28 MED ORDER — BUPROPION HCL ER (XL) 150 MG PO TB24: 150.0000 mg | ORAL_TABLET | Freq: Every day | ORAL | Status: DC

## 2023-04-28 MED ORDER — ZIPRASIDONE MESYLATE 20 MG IM SOLR: 20.0000 mg | INTRAMUSCULAR | Status: DC | PRN

## 2023-04-28 NOTE — Consult Note (Addendum)
[facility] Face-to-Face Psychiatry Consult   Reason for Consult:  "scizophreneia, psychosis" Referring Physician:  Dr. Onalee Hua T. Wells Patient Identification: Anthony Luna MRN:  [id number] Principal Diagnosis: Schizoaffective disorder, bipolar type (HCC) Diagnosis:  Principal Problem:   Schizoaffective disorder, bipolar type (HCC)  Total Time spent with patient: 45 minutes  Subjective:   Anthony Luna is a 37 y.o. male patient admitted to [facility] voluntarily with BPD. Per initial triage note:   Pt to ED with BPD voluntarily, Pt states it is cold outside and he has no food or money, pt denies SI denies hearing voices or seeing things but states he would like to speak to psychiatrist while he is here. Pt has hx schizophrenia, states he has been taking prescribed medications. Pt talking erratic  in triage but is calm and cooperative. Pt yelling out random numbers during triage process and asking how to spell the letter "E" pt will begin talking and stop mid sentence and stare straight forward. Pt states he saw scream 7 last night and since has felt like someone is out to get him. Pt licking his own blood in triage.      HPI:   Pt presents with bizarre behavior. Standing above his food in the open mileu. Inappropriate laughter noted throughout assessment. Appears to exhibit thought blocking, engages in conversation then abruptly stops at intervals. He reports suicidal ideations with plans to crash a car. Denies homicidal ideations. Reports auditory hallucinations, which he reports is "my conscious". Reports auditory hallucinations tell him to hurt self/others. Reports he is also experiencing visual hallucinations, visions of the world ending. He reports his sleep and appetite are "great". Discussed with pt recommendation for inpatient psychiatric admission. Pt laughs inappropriately.   Past Psychiatric History: Schizophrenia  Risk to Self: Yes Risk to Others: Yes Prior Inpatient Therapy:  Yes Prior Outpatient Therapy: [facility]  Past Medical History:  Past Medical History:  Diagnosis Date   Depression    Since moving into new apartment 1 year ago   None to low serum cortisol response with adrenocorticotrophic hormone (ACTH) stimulation test    Schizophrenia, paranoid type (HCC)    History reviewed. No pertinent surgical history. Family History: History reviewed. No pertinent family history. Family Psychiatric  History: None reported Social History:  Social History   Substance and Sexual Activity  Alcohol Use Yes   Alcohol/week: 0.0 standard drinks of alcohol   Comment: "Special occasions"     Social History   Substance and Sexual Activity  Drug Use Yes   Frequency: 4.0 times per week   Types: Marijuana   Comment: "2 blunts"    Social History   Socioeconomic History   Marital status: Single    Spouse name: Not on file   Number of children: Not on file   Years of education: Not on file   Highest education level: Not on file  Occupational History   Not on file  Tobacco Use   Smoking status: Every Day    Current packs/day: 0.50    Average packs/day: 0.5 packs/day for 20.4 years (10.2 ttl pk-yrs)    Types: Cigarettes    Start date: 11/29/2002   Smokeless tobacco: Former   Tobacco comments:    does not want to quit  Vaping Use   Vaping status: Never Used  Substance and Sexual Activity   Alcohol use: Yes    Alcohol/week: 0.0 standard drinks of alcohol    Comment: "Special occasions"   Drug use: Yes  Frequency: 4.0 times per week    Types: Marijuana    Comment: "2 blunts"   Sexual activity: Not on file  Other Topics Concern   Not on file  Social History Narrative   Not on file   Social Determinants of Health   Financial Resource Strain: Not on file  Food Insecurity: No Food Insecurity (04/28/2022)   Hunger Vital Sign    Worried About Running Out of Food in the Last Year: Never true    Ran Out of Food in the Last Year: Never true   Transportation Needs: No Transportation Needs (04/28/2022)   PRAPARE - Administrator, Civil Service (Medical): No    Lack of Transportation (Non-Medical): No  Physical Activity: Not on file  Stress: Not on file  Social Connections: Not on file   Allergies:   Allergies  Allergen Reactions   Shellfish Allergy     Labs:  Results for orders placed or performed during the hospital encounter of 04/27/23 (from the past 48 hour(s))  Comprehensive metabolic panel     Status: Abnormal   Collection Time: 04/27/23  7:38 PM  Result Value Ref Range   Sodium 138 135 - 145 mmol/L   Potassium 4.3 3.5 - 5.1 mmol/L   Chloride 107 98 - 111 mmol/L   CO2 24 22 - 32 mmol/L   Glucose, Bld 94 70 - 99 mg/dL    Comment: Glucose reference range applies only to samples taken after fasting for at least 8 hours.   BUN 17 6 - 20 mg/dL   Creatinine, Ser 3.87 0.61 - 1.24 mg/dL   Calcium 8.8 (L) 8.9 - 10.3 mg/dL   Total Protein 7.4 6.5 - 8.1 g/dL   Albumin 4.2 3.5 - 5.0 g/dL   AST 74 (H) 15 - 41 U/L   ALT 47 (H) 0 - 44 U/L   Alkaline Phosphatase 65 38 - 126 U/L   Total Bilirubin 0.7 <1.2 mg/dL   GFR, Estimated >56 >43 mL/min    Comment: (NOTE) Calculated using the CKD-EPI Creatinine Equation (2021)    Anion gap 7 5 - 15    Comment: Performed at [facility], [address] [place], [place] [address]  Ethanol     Status: None   Collection Time: 04/27/23  7:38 PM  Result Value Ref Range   Alcohol, Ethyl (B) <10 <10 mg/dL    Comment: (NOTE) Lowest detectable limit for serum alcohol is 10 mg/dL.  For medical purposes only. Performed at [facility], [address] [place], [place] [address]   Salicylate level     Status: Abnormal   Collection Time: 04/27/23  7:38 PM  Result Value Ref Range   Salicylate Lvl <7.0 (L) 7.0 - 30.0 mg/dL    Comment: Performed at [facility], [address] [place], [place] [address]  Acetaminophen level     Status: Abnormal    Collection Time: 04/27/23  7:38 PM  Result Value Ref Range   Acetaminophen (Tylenol), Serum <10 (L) 10 - 30 ug/mL    Comment: (NOTE) Therapeutic concentrations vary significantly. A range of 10-30 ug/mL  may be an effective concentration for many patients. However, some  are best treated at concentrations outside of this range. Acetaminophen concentrations >150 ug/mL at 4 hours after ingestion  and >50 ug/mL at 12 hours after ingestion are often associated with  toxic reactions.  Performed at [facility], [address], [place], [place] [address]   cbc  Status: Abnormal   Collection Time: 04/27/23  7:38 PM  Result Value Ref Range   WBC 10.7 (H) 4.0 - 10.5 K/uL   RBC 4.51 4.22 - 5.81 MIL/uL   Hemoglobin 14.3 13.0 - 17.0 g/dL   HCT 78.2 95.6 - 21.3 %   MCV 95.1 80.0 - 100.0 fL   MCH 31.7 26.0 - 34.0 pg   MCHC 33.3 30.0 - 36.0 g/dL   RDW 08.6 57.8 - 46.9 %   Platelets 207 150 - 400 K/uL   nRBC 0.0 0.0 - 0.2 %    Comment: Performed at [facility], [address], [place], [place] [address]  Urine Drug Screen, Qualitative     Status: Abnormal   Collection Time: 04/27/23  8:15 PM  Result Value Ref Range   Tricyclic, Ur Screen POSITIVE (A) NONE DETECTED   Amphetamines, Ur Screen NONE DETECTED NONE DETECTED   MDMA (Ecstasy)Ur Screen NONE DETECTED NONE DETECTED   Cocaine Metabolite,Ur [place] NONE DETECTED NONE DETECTED   Opiate, Ur Screen NONE DETECTED NONE DETECTED   Phencyclidine (PCP) Ur S NONE DETECTED NONE DETECTED   Cannabinoid 50 Ng, Ur [place] POSITIVE (A) NONE DETECTED   Barbiturates, Ur Screen NONE DETECTED NONE DETECTED   Benzodiazepine, Ur Scrn NONE DETECTED NONE DETECTED   Methadone Scn, Ur NONE DETECTED NONE DETECTED    Comment: (NOTE) Tricyclics + metabolites, urine    Cutoff 1000 ng/mL Amphetamines + metabolites, urine  Cutoff 1000 ng/mL MDMA (Ecstasy), urine              Cutoff 500 ng/mL Cocaine Metabolite, urine          Cutoff 300  ng/mL Opiate + metabolites, urine        Cutoff 300 ng/mL Phencyclidine (PCP), urine         Cutoff 25 ng/mL Cannabinoid, urine                 Cutoff 50 ng/mL Barbiturates + metabolites, urine  Cutoff 200 ng/mL Benzodiazepine, urine              Cutoff 200 ng/mL Methadone, urine                   Cutoff 300 ng/mL  The urine drug screen provides only a preliminary, unconfirmed analytical test result and should not be used for non-medical purposes. Clinical consideration and professional judgment should be applied to any positive drug screen result due to possible interfering substances. A more specific alternate chemical method must be used in order to obtain a confirmed analytical result. Gas chromatography / mass spectrometry (GC/MS) is the preferred confirm atory method. Performed at [facility], [address] [place], [place] [address]     Current Facility-Administered Medications  Medication Dose Route Frequency Provider Last Rate Last Admin   benztropine (COGENTIN) tablet 1 mg  1 mg Oral BID Motley-Mangrum, Jadeka A, PMHNP   1 mg at 04/28/23 0129   buPROPion (WELLBUTRIN XL) 24 hr tablet 150 mg  150 mg Oral Daily Motley-Mangrum, Jadeka A, PMHNP       cloZAPine (CLOZARIL) tablet 300 mg  300 mg Oral QHS Motley-Mangrum, Jadeka A, PMHNP   300 mg at 04/28/23 0129   hydrOXYzine (ATARAX) tablet 25 mg  25 mg Oral TID PRN Motley-Mangrum, Jadeka A, PMHNP       OLANZapine zydis (ZYPREXA) disintegrating tablet 5 mg  5 mg Oral Q8H PRN Motley-Mangrum, Ezra Sites, PMHNP       And  LORazepam (ATIVAN) tablet 1 mg  1 mg Oral PRN Motley-Mangrum, Jadeka A, PMHNP       And   ziprasidone (GEODON) injection 20 mg  20 mg Intramuscular PRN Motley-Mangrum, Geralynn Ochs A, PMHNP       Current Outpatient Medications  Medication Sig Dispense Refill   albuterol (VENTOLIN HFA) 108 (90 Base) MCG/ACT inhaler Inhale 1-2 puffs into the lungs every 6 (six) hours as needed for shortness of breath. 18 g 1    benztropine (COGENTIN) 0.5 MG tablet Take 2 tablets (1 mg total) by mouth 2 (two) times daily. 60 tablet 1   buPROPion (WELLBUTRIN XL) 150 MG 24 hr tablet Take 1 tablet (150 mg total) by mouth daily. 30 tablet 1   cetirizine (ZYRTEC) 10 MG tablet Take 1 tablet (10 mg total) by mouth daily. 30 tablet 1   clonazePAM (KLONOPIN) 0.5 MG tablet Take 1 tablet (0.5 mg total) by mouth 2 (two) times daily. 60 tablet 1   cloZAPine (CLOZARIL) 100 MG tablet Take 300 mg by mouth at bedtime. Take along with one 50 mg tablet for total 350 mg at bedtime     docusate sodium (COLACE) 100 MG capsule Take 1 capsule (100 mg total) by mouth 2 (two) times daily. 60 capsule 1   fluticasone (FLONASE) 50 MCG/ACT nasal spray Place 2 sprays into both nostrils daily as needed for allergies or rhinitis. 16 g 1   hydrOXYzine (ATARAX) 25 MG tablet Take 1 tablet (25 mg total) by mouth 3 (three) times daily as needed for anxiety. 30 tablet 1   melatonin 5 MG TABS Take 1 tablet (5 mg total) by mouth at bedtime. (Patient taking differently: Take 3 mg by mouth at bedtime.) 30 tablet 1   montelukast (SINGULAIR) 10 MG tablet Take 1 tablet (10 mg total) by mouth at bedtime. 30 tablet 1   nicotine (NICODERM CQ - DOSED IN MG/24 HOURS) 21 mg/24hr patch Place 1 patch (21 mg total) onto the skin daily. 28 patch 1   Paliperidone Palmitate (INVEGA TRINZA) 819 MG/2.625ML SUSP Inject 819 mg into the muscle.     propranolol (INDERAL) 20 MG tablet Take 1 tablet (20 mg total) by mouth 2 (two) times daily. 60 tablet 1   cloZAPine (CLOZARIL) 50 MG tablet Take 7 tablets (350 mg total) by mouth at bedtime. (Patient not taking: Reported on 04/27/2023) 210 tablet 0   Vitamin D, Ergocalciferol, (DRISDOL) 1.25 MG (50000 UNIT) CAPS capsule Take 1 capsule (50,000 Units total) by mouth every 7 (seven) days. (Patient not taking: Reported on 04/27/2023) 5 capsule 1    Musculoskeletal: Strength & Muscle Tone: within normal limits Gait & Station: normal Patient  leans: N/A            Psychiatric Specialty Exam:  Presentation  General Appearance:  Disheveled; Appropriate for Environment  Eye Contact: Minimal  Speech: Clear and Coherent  Speech Volume: Normal  Handedness: Right   Mood and Affect  Mood: Euphoric  Affect: Inappropriate   Thought Process  Thought Processes: Coherent; Other (comment) (thought blocking)  Descriptions of Associations:Intact  Orientation:Full (Time, Place and Person)  Thought Content:WDL  History of Schizophrenia/Schizoaffective disorder:Yes  Duration of Psychotic Symptoms:Greater than six months  Hallucinations:Hallucinations: Visual; Auditory  Ideas of Reference:None  Suicidal Thoughts:Suicidal Thoughts: Yes, Active SI Active Intent and/or Plan: With Plan  Homicidal Thoughts:Homicidal Thoughts: No   Sensorium  Memory: Immediate Fair  Judgment: Poor  Insight: Poor   Executive Functions  Concentration: Poor  Attention Span: Poor  Recall: Poor  Fund of Knowledge: Fair  Language: Fair   Psychomotor Activity  Psychomotor Activity: Psychomotor Activity: Normal   Assets  Assets: Communication Skills; Desire for Improvement; Resilience   Sleep  Sleep: Sleep: Fair   Physical Exam: Physical Exam Constitutional:      General: He is not in acute distress.    Appearance: He is not ill-appearing, toxic-appearing or diaphoretic.  Eyes:     General: No scleral icterus. Cardiovascular:     Rate and Rhythm: Normal rate.  Pulmonary:     Effort: Pulmonary effort is normal. No respiratory distress.  Neurological:     Mental Status: He is alert and oriented to person, place, and time.  Psychiatric:        Attention and Perception: He is inattentive. He perceives auditory and visual hallucinations.        Mood and Affect: Mood is elated. Affect is inappropriate.        Speech: Speech normal.        Behavior: Behavior is cooperative.        Thought  Content: Thought content is not paranoid or delusional. Thought content includes suicidal ideation. Thought content does not include homicidal ideation. Thought content includes suicidal plan.    Review of Systems  Constitutional:  Negative for chills and fever.  Respiratory:  Negative for shortness of breath.   Cardiovascular:  Negative for chest pain and palpitations.  Gastrointestinal:  Negative for abdominal pain.  Neurological:  Negative for headaches.  Psychiatric/Behavioral:  Positive for hallucinations.    Blood pressure 132/85, pulse 99, temperature 98.7 F (37.1 C), temperature source Oral, resp. rate 16, height 6' (1.829 m), weight 82.1 kg, SpO2 96%. Body mass index is 24.55 kg/m.  Treatment Plan Summary: 37 y/o male w/ history of schizophrenia, appears decompensated at this time. Endorses suicidal ideations. Endorses auditory visual hallucinations. Presents with bizarre behaviors and inappropriate affect. Pt placed under IVC and recommended for inpatient psychiatric admission.  Continue cogentin 1mg  oral 2 times daily Discontinue wellbutrin xl 150mg  oral daily  Continue hydroxyzine 25mg  oral 3 times daily PRN anxiety Continue zyprexa 5mg  oral every 8 hours PRN agitation and ativan 1mg  oral as needed, anxiety, severe agitation for 1 dose and geodon 20mg  IM as needed agitation  Disposition: Recommend psychiatric Inpatient admission when medically cleared.  Lauree Chandler, NP 04/28/2023 9:30 AM

## 2023-04-28 NOTE — ED Notes (Signed)
Pt. Transferred to BHU , room# 3 from ED .Patient was screened by security before entering the unit. Received Report and Recommendations from [place], [place] . Pt. Oriented to unit including Q15 minute rounding as well as locked bathroom protocol, meal / snack schedule, and  the security cameras in place for their protection. Patient is A/O x 4, warm / dry.  Showing no acute signs of distress. Staff to monitor as ordered

## 2023-04-28 NOTE — ED Notes (Signed)
Pt provided with snack. Pt sitting up in bed eating.

## 2023-04-28 NOTE — ED Notes (Signed)
Pt given hospital meal tray. 100% of meal eaten. Pt tolerated without complaints.

## 2023-04-28 NOTE — ED Notes (Signed)
Pt  placed  under IVC  per  NP

## 2023-04-28 NOTE — ED Notes (Signed)
Refused vitals, states that he is scared of the BP cuff and labwork.

## 2023-04-28 NOTE — ED Notes (Signed)
VOL    MOVED  TO  BHU  

## 2023-04-28 NOTE — ED Notes (Signed)
Pt refuses vital signs.

## 2023-04-28 NOTE — ED Notes (Addendum)
Patient refuses to be still while this Rn is obtaining blood drawl. While attempting to obtain blood draw, pt began yelling at this RN, telling this RN to remove needle. Needle removed by this Rn.

## 2023-04-28 NOTE — ED Notes (Signed)
Report given to Diplomatic Services operational officer at [facility].
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
## 2023-04-28 NOTE — Consult Note (Signed)
[facility] ED ASSESSMENT    Reason for Consult: Psych Consult  Referring Physician: Dr. Anner Crete Patient Identification: Anthony Luna MRN:  [id number] ED Chief Complaint: Schizoaffective disorder, bipolar type [facility])  Diagnosis:  Principal Problem:   Schizoaffective disorder, bipolar type [facility])   ED Assessment Time Calculation: Start Time: 0945 Stop Time: 1020 Total Time in Minutes (Assessment Completion): 35   Subjective: Anthony Luna is a 37 y.o. male patient with history of schizophrenia presenting today for psychiatric evaluation.    HPI: Anthony Luna, 37 y.o., male patient seen face to face by this provider, and chart reviewed on 04/28/23.  On evaluation Anthony Luna reports that he is not feeling in his right state of mind, he states he needs help, for his physical, mental, spiritual, and emotional health.  Patient then goes on to say "why do people have to be so mean "when this provider asked him who is speaking up he stated his family, stating that they are hateful and unkind, patient did not elaborate.  He told this provider that he was just at [facility] a week ago, for mental health stabilization and medication management, states that he feels that [facility] worked for him.  He currently denies SI/HI/AVH.  Endorses using marijuana, denies using any other illicit substances or alcohol.  He states that he currently works at [employer], patient did not answer the question where he lived.  During evaluation [name] is sitting on the edge of his bed, and appears to be in no acute distress.  He is alert, oriented x 4, calm, cooperative and attentive. His mood is euphoric with congruent, excited affect.  He has normal speech, and behavior.  Objectively there is no evidence of psychosis/mania or delusional thinking.  He currently denies suicidal/self-harm/homicidal ideation, psychosis, and paranoia.  Patient is a 37 year old, English speaking, Black male  with a hx of Schizoaffective Disorder, Bipolar Type and cannabis use disorder, severe dependence.  During evaluation patient appeared to be silly, jokeful with this provider and therapist, with poor concentration.  Patient brought in voluntarily due to saying it is cold outside and he has no food or money.  Patient reports that he has a diagnosis of schizoaffective bipolar and he states that he is compliant with medications.  Past Psychiatric History: Schizoaffective bipolar  Risk to Self or Others: Risk to Self:  No Risk to Others:  No  Prior Inpatient Therapy:  Yes  Prior Outpatient Therapy:  No   [place] Scale:  Flowsheet Row ED from 04/27/2023 in [facility] Emergency Department at [facility] ED from 04/16/2023 in [facility] Emergency Department at [facility] ED from 03/24/2023 in [facility] Emergency Department at [facility]  C-SSRS RISK CATEGORY High Risk No Risk High Risk       AIMS:  , , ,  ,   ASAM:    Substance Abuse:     Past Medical History:  Past Medical History:  Diagnosis Date   Depression    Since moving into new apartment 1 year ago   None to low serum cortisol response with adrenocorticotrophic hormone (ACTH) stimulation test    Schizophrenia, paranoid type (HCC)    History reviewed. No pertinent surgical history. Family History: History reviewed. No pertinent family history.  Social History:  Social History   Substance and Sexual Activity  Alcohol Use Yes   Alcohol/week: 0.0 standard drinks of alcohol   Comment: "Special occasions"     Social History  Substance and Sexual Activity  Drug Use Yes   Frequency: 4.0 times per week   Types: Marijuana   Comment: "2 blunts"    Social History   Socioeconomic History   Marital status: Single    Spouse name: Not on file   Number of children: Not on file   Years of education: Not on file   Highest education level: Not on file  Occupational History   Not on file  Tobacco Use    Smoking status: Every Day    Current packs/day: 0.50    Average packs/day: 0.5 packs/day for 20.4 years (10.2 ttl pk-yrs)    Types: Cigarettes    Start date: 11/29/2002   Smokeless tobacco: Former   Tobacco comments:    does not want to quit  Vaping Use   Vaping status: Never Used  Substance and Sexual Activity   Alcohol use: Yes    Alcohol/week: 0.0 standard drinks of alcohol    Comment: "Special occasions"   Drug use: Yes    Frequency: 4.0 times per week    Types: Marijuana    Comment: "2 blunts"   Sexual activity: Not on file  Other Topics Concern   Not on file  Social History Narrative   Not on file   Social Determinants of Health   Financial Resource Strain: Not on file  Food Insecurity: No Food Insecurity (04/28/2022)   Hunger Vital Sign    Worried About Running Out of Food in the Last Year: Never true    Ran Out of Food in the Last Year: Never true  Transportation Needs: No Transportation Needs (04/28/2022)   PRAPARE - Administrator, Civil Service (Medical): No    Lack of Transportation (Non-Medical): No  Physical Activity: Not on file  Stress: Not on file  Social Connections: Not on file       Allergies:   Allergies  Allergen Reactions   Shellfish Allergy     Labs:  Results for orders placed or performed during the hospital encounter of 04/27/23 (from the past 48 hour(s))  Comprehensive metabolic panel     Status: Abnormal   Collection Time: 04/27/23  7:38 PM  Result Value Ref Range   Sodium 138 135 - 145 mmol/L   Potassium 4.3 3.5 - 5.1 mmol/L   Chloride 107 98 - 111 mmol/L   CO2 24 22 - 32 mmol/L   Glucose, Bld 94 70 - 99 mg/dL    Comment: Glucose reference range applies only to samples taken after fasting for at least 8 hours.   BUN 17 6 - 20 mg/dL   Creatinine, Ser 1.61 0.61 - 1.24 mg/dL   Calcium 8.8 (L) 8.9 - 10.3 mg/dL   Total Protein 7.4 6.5 - 8.1 g/dL   Albumin 4.2 3.5 - 5.0 g/dL   AST 74 (H) 15 - 41 U/L   ALT 47 (H) 0 - 44  U/L   Alkaline Phosphatase 65 38 - 126 U/L   Total Bilirubin 0.7 <1.2 mg/dL   GFR, Estimated >09 >60 mL/min    Comment: (NOTE) Calculated using the CKD-EPI Creatinine Equation (2021)    Anion gap 7 5 - 15    Comment: Performed at [facility], [address] [place], [place] [address]  Ethanol     Status: None   Collection Time: 04/27/23  7:38 PM  Result Value Ref Range   Alcohol, Ethyl (B) <10 <10 mg/dL    Comment: (NOTE) Lowest detectable limit for  serum alcohol is 10 mg/dL.  For medical purposes only. Performed at [facility], [address] [place], [place] [address]   Salicylate level     Status: Abnormal   Collection Time: 04/27/23  7:38 PM  Result Value Ref Range   Salicylate Lvl <7.0 (L) 7.0 - 30.0 mg/dL    Comment: Performed at [facility], [address] [place], [place] [address]  Acetaminophen level     Status: Abnormal   Collection Time: 04/27/23  7:38 PM  Result Value Ref Range   Acetaminophen (Tylenol), Serum <10 (L) 10 - 30 ug/mL    Comment: (NOTE) Therapeutic concentrations vary significantly. A range of 10-30 ug/mL  may be an effective concentration for many patients. However, some  are best treated at concentrations outside of this range. Acetaminophen concentrations >150 ug/mL at 4 hours after ingestion  and >50 ug/mL at 12 hours after ingestion are often associated with  toxic reactions.  Performed at [facility], [address] [place], [place] [address]   cbc     Status: Abnormal   Collection Time: 04/27/23  7:38 PM  Result Value Ref Range   WBC 10.7 (H) 4.0 - 10.5 K/uL   RBC 4.51 4.22 - 5.81 MIL/uL   Hemoglobin 14.3 13.0 - 17.0 g/dL   HCT 95.1 88.4 - 16.6 %   MCV 95.1 80.0 - 100.0 fL   MCH 31.7 26.0 - 34.0 pg   MCHC 33.3 30.0 - 36.0 g/dL   RDW 06.3 01.6 - 01.0 %   Platelets 207 150 - 400 K/uL   nRBC 0.0 0.0 - 0.2 %    Comment: Performed at [facility], [address]  [place], [place] [address]  Urine Drug Screen, Qualitative     Status: Abnormal   Collection Time: 04/27/23  8:15 PM  Result Value Ref Range   Tricyclic, Ur Screen POSITIVE (A) NONE DETECTED   Amphetamines, Ur Screen NONE DETECTED NONE DETECTED   MDMA (Ecstasy)Ur Screen NONE DETECTED NONE DETECTED   Cocaine Metabolite,Ur [place] NONE DETECTED NONE DETECTED   Opiate, Ur Screen NONE DETECTED NONE DETECTED   Phencyclidine (PCP) Ur S NONE DETECTED NONE DETECTED   Cannabinoid 50 Ng, Ur [place] POSITIVE (A) NONE DETECTED   Barbiturates, Ur Screen NONE DETECTED NONE DETECTED   Benzodiazepine, Ur Scrn NONE DETECTED NONE DETECTED   Methadone Scn, Ur NONE DETECTED NONE DETECTED    Comment: (NOTE) Tricyclics + metabolites, urine    Cutoff 1000 ng/mL Amphetamines + metabolites, urine  Cutoff 1000 ng/mL MDMA (Ecstasy), urine              Cutoff 500 ng/mL Cocaine Metabolite, urine          Cutoff 300 ng/mL Opiate + metabolites, urine        Cutoff 300 ng/mL Phencyclidine (PCP), urine         Cutoff 25 ng/mL Cannabinoid, urine                 Cutoff 50 ng/mL Barbiturates + metabolites, urine  Cutoff 200 ng/mL Benzodiazepine, urine              Cutoff 200 ng/mL Methadone, urine                   Cutoff 300 ng/mL  The urine drug screen provides only a preliminary, unconfirmed analytical test result and should not be used for non-medical purposes. Clinical consideration and professional judgment should be applied to any  positive drug screen result due to possible interfering substances. A more specific alternate chemical method must be used in order to obtain a confirmed analytical result. Gas chromatography / mass spectrometry (GC/MS) is the preferred confirm atory method. Performed at [facility], [address] [place], [place] [address]     No current facility-administered medications for this encounter.   Current Outpatient Medications  Medication Sig Dispense Refill   albuterol (VENTOLIN  HFA) 108 (90 Base) MCG/ACT inhaler Inhale 1-2 puffs into the lungs every 6 (six) hours as needed for shortness of breath. 18 g 1   benztropine (COGENTIN) 0.5 MG tablet Take 2 tablets (1 mg total) by mouth 2 (two) times daily. 60 tablet 1   buPROPion (WELLBUTRIN XL) 150 MG 24 hr tablet Take 1 tablet (150 mg total) by mouth daily. 30 tablet 1   cetirizine (ZYRTEC) 10 MG tablet Take 1 tablet (10 mg total) by mouth daily. 30 tablet 1   clonazePAM (KLONOPIN) 0.5 MG tablet Take 1 tablet (0.5 mg total) by mouth 2 (two) times daily. 60 tablet 1   cloZAPine (CLOZARIL) 100 MG tablet Take 300 mg by mouth at bedtime. Take along with one 50 mg tablet for total 350 mg at bedtime     docusate sodium (COLACE) 100 MG capsule Take 1 capsule (100 mg total) by mouth 2 (two) times daily. 60 capsule 1   fluticasone (FLONASE) 50 MCG/ACT nasal spray Place 2 sprays into both nostrils daily as needed for allergies or rhinitis. 16 g 1   hydrOXYzine (ATARAX) 25 MG tablet Take 1 tablet (25 mg total) by mouth 3 (three) times daily as needed for anxiety. 30 tablet 1   melatonin 5 MG TABS Take 1 tablet (5 mg total) by mouth at bedtime. (Patient taking differently: Take 3 mg by mouth at bedtime.) 30 tablet 1   montelukast (SINGULAIR) 10 MG tablet Take 1 tablet (10 mg total) by mouth at bedtime. 30 tablet 1   nicotine (NICODERM CQ - DOSED IN MG/24 HOURS) 21 mg/24hr patch Place 1 patch (21 mg total) onto the skin daily. 28 patch 1   Paliperidone Palmitate (INVEGA TRINZA) 819 MG/2.625ML SUSP Inject 819 mg into the muscle.     propranolol (INDERAL) 20 MG tablet Take 1 tablet (20 mg total) by mouth 2 (two) times daily. 60 tablet 1   cloZAPine (CLOZARIL) 50 MG tablet Take 7 tablets (350 mg total) by mouth at bedtime. (Patient not taking: Reported on 04/27/2023) 210 tablet 0   Vitamin D, Ergocalciferol, (DRISDOL) 1.25 MG (50000 UNIT) CAPS capsule Take 1 capsule (50,000 Units total) by mouth every 7 (seven) days. (Patient not taking:  Reported on 04/27/2023) 5 capsule 1    Musculoskeletal:    Psychiatric Specialty Exam: Presentation  General Appearance:  Appropriate for Environment  Eye Contact: Good  Speech: Clear and Coherent  Speech Volume: Normal  Handedness: Right   Mood and Affect  Mood: Euthymic; Euphoric  Affect: Appropriate   Thought Process  Thought Processes: Coherent  Descriptions of Associations:Intact  Orientation:Full (Time, Place and Person)  Thought Content:WDL  History of Schizophrenia/Schizoaffective disorder:Yes  Duration of Psychotic Symptoms:Greater than six months  Hallucinations:Hallucinations: None  Ideas of Reference:None  Suicidal Thoughts:Suicidal Thoughts: No  Homicidal Thoughts:Homicidal Thoughts: No   Sensorium  Memory: Immediate Fair; Recent Fair  Judgment: Fair  Insight: Fair   Art therapist  Concentration: Fair  Attention Span: Fair  Recall: [employer] of Knowledge: Fair  Language: Fair   Psychomotor Activity  Psychomotor Activity: Psychomotor Activity: Normal   Assets  Assets: Communication Skills; Desire for Improvement; Social Support    Sleep  Sleep: Sleep: Fair   Physical Exam: Physical Exam Vitals and nursing note reviewed. Exam conducted with a chaperone present.  Neurological:     Mental Status: He is alert.  Psychiatric:        Attention and Perception: Attention normal.        Mood and Affect: Mood is anxious and elated.        Speech: Speech normal.        Behavior: Behavior is cooperative.        Judgment: Judgment is inappropriate.    Review of Systems  Constitutional: Negative.   Psychiatric/Behavioral:  Positive for suicidal ideas.    Blood pressure 123/73, pulse 97, temperature 98.7 F (37.1 C), temperature source Oral, resp. rate 16, height 6' (1.829 m), weight 82.1 kg, SpO2 96%. Body mass index is 24.55 kg/m.   Medical Decision Making: Monitor overnight and reevaluate  in am.     Sophie Tamez MOTLEY-MANGRUM, PMHNP 04/28/2023 12:08 AM

## 2023-04-28 NOTE — ED Notes (Signed)
Taken to [facility] by EDT and security via wheelchair. Pt A&OX4, clean and dry, ambulates independently.

## 2023-04-28 NOTE — BH Assessment (Signed)
PATIENT BED AVAILABLE AFTER 9AM ON 04/29/23  Patient has been accepted to [facility].  Accepting physician is Dr. Sofie Hartigan.  Call report to [phone].  Representative was [employer].   ER Staff is aware of it:  [facility] ER Secretary  Dr. Rosalia Hammers, ER MD  Amy Patient's Nurse

## 2023-04-28 NOTE — ED Notes (Signed)
PT VOL PENDING REEVALUATION.

## 2023-04-28 NOTE — ED Notes (Signed)
During nursing assessment Mr Carda was A/Ox 3 .  He  stated that he is not currently have thoughts or feelings of SI/HI.  Mr Koehnen reported that he does not currently having auditory or visual hallucinations; however, pt is seen interacting with internal stimuli.  Pt affect is flat , eye contact is poor, speech is quiet in volume  with aggressive/paranoid verbiage noted.  Staff addressed any feelings or concerns that have been brought up.  Medications were attempted to be administered as ordered, pt continues to decline any medication. ED MD notified as pt is starting to escalate with behaviors and becoming verbally aggressive with peers. Pt currently pacing in day area

## 2023-04-28 NOTE — ED Notes (Signed)
Pt's care manager, Harriett Sine with [facility] onsite to see patient.  Pt spoke briefly with [employer] rep.  Harriett Sine then was requesting medical information for patient. Staff directed [facility] to medical records.

## 2023-04-28 NOTE — BH Assessment (Signed)
Adult MH  Referral information for Psychiatric Hospitalization faxed to:   [facility] ([phone]-or- [phone]),   [facility] ([phone]),   [facility] ([phone] -or- [phone]),   [place] ([phone]---[phone]---[phone]),   [place] ([phone] or [phone])   [place] ([phone] or [phone]),   [place] ([phone]) 

## 2023-04-28 NOTE — ED Notes (Signed)
Hospital meal provided, pt tolerated w/o complaints.  Waste discarded appropriately.  

## 2023-04-28 NOTE — ED Notes (Signed)
IVC/ Pt pending placement

## 2023-04-29 NOTE — ED Notes (Signed)
Pt stated that he was apprehensive about returning to inpatient facility @ [facility], he verbalized understanding that this is the closest appropriate facility with an available bed. Pt requested PO PRN medication that was given.  Cont to monitor as ordered

## 2023-04-29 NOTE — ED Provider Notes (Signed)
Emergency Medicine Observation Re-evaluation Note  Kruse Shoupe is a 37 y.o. male, seen on rounds today.  Pt initially presented to the ED for complaints of Psychiatric Evaluation  Currently, the patient is resting comfortably.  Physical Exam  BP (!) 149/88   Pulse (!) 112   Temp 99.1 F (37.3 C)   Resp 18   Ht 6' (1.829 m)   Wt 82.1 kg   SpO2 99%   BMI 24.55 kg/m  General: No acute distress Cardiac: Well-perfused extremities Lungs: No respiratory distress Psych: Appropriate mood and affect  ED Course / MDM  EKG:   I have reviewed the labs performed to date as well as medications administered while in observation.  Recent changes in the last 24 hours include none.  Plan  Current plan is for transfer to [facility] with the accepting physician as Dr. Lorie Apley, Clent Jacks, MD 04/29/23 [phone]

## 2023-04-29 NOTE — ED Notes (Signed)
ivc/Patient has been accepted to [facility] PATIENT BED AVAILABLE AFTER 9AM ON 04/29/23

## 2023-04-29 NOTE — ED Notes (Signed)
EMTALA reviewed by charge RN 

## 2023-04-29 NOTE — ED Notes (Signed)
[place]  [place]  [facility]  called  for  transport  to  [facility] 

## 2023-04-29 NOTE — ED Notes (Signed)
Staff informed pt that he is transferring to [facility], Mr Denley is agreeable

## 2023-05-09 ENCOUNTER — Emergency Department
Admission: EM | Admit: 2023-05-09 | Discharge: 2023-05-10 | Disposition: A | Discharge status: Other Acute Inpatient Hospital | Payer: MEDICAID | Attending: Emergency Medicine | Admitting: Emergency Medicine

## 2023-05-09 DIAGNOSIS — F201 Disorganized schizophrenia: Secondary | ICD-10-CM | POA: Diagnosis not present

## 2023-05-09 DIAGNOSIS — F209 Schizophrenia, unspecified: Secondary | ICD-10-CM | POA: Diagnosis present

## 2023-05-09 DIAGNOSIS — R45851 Suicidal ideations: Secondary | ICD-10-CM | POA: Insufficient documentation

## 2023-05-09 DIAGNOSIS — F250 Schizoaffective disorder, bipolar type: Secondary | ICD-10-CM | POA: Diagnosis not present

## 2023-05-09 DIAGNOSIS — R440 Auditory hallucinations: Secondary | ICD-10-CM | POA: Diagnosis present

## 2023-05-09 LAB — COMPREHENSIVE METABOLIC PANEL
ALT: 32 U/L (ref 0–44)
AST: 34 U/L (ref 15–41)
Albumin: 3.6 g/dL (ref 3.5–5.0)
Alkaline Phosphatase: 66 U/L (ref 38–126)
Anion gap: 9 (ref 5–15)
BUN: 22 mg/dL — ABNORMAL HIGH (ref 6–20)
CO2: 24 mmol/L (ref 22–32)
Calcium: 8.5 mg/dL — ABNORMAL LOW (ref 8.9–10.3)
Chloride: 104 mmol/L (ref 98–111)
Creatinine, Ser: 1.15 mg/dL (ref 0.61–1.24)
GFR, Estimated: >60 mL/min (ref >60)
Glucose, Bld: 93 mg/dL (ref 70–99)
Potassium: 4.1 mmol/L (ref 3.5–5.1)
Sodium: 137 mmol/L (ref 135–145)
Total Bilirubin: 0.6 mg/dL (ref <1.2)
Total Protein: 6.6 g/dL (ref 6.5–8.1)

## 2023-05-09 LAB — URINE DRUG SCREEN, QUALITATIVE (ARMC ONLY)
Amphetamines, Ur Screen: NOT DETECTED
Barbiturates, Ur Screen: NOT DETECTED
Benzodiazepine, Ur Scrn: NOT DETECTED
Cannabinoid 50 Ng, Ur ~~LOC~~: POSITIVE — AB
Cocaine Metabolite,Ur ~~LOC~~: NOT DETECTED
MDMA (Ecstasy)Ur Screen: NOT DETECTED
Methadone Scn, Ur: NOT DETECTED
Opiate, Ur Screen: NOT DETECTED
Phencyclidine (PCP) Ur S: NOT DETECTED
Tricyclic, Ur Screen: NOT DETECTED

## 2023-05-09 LAB — CBC
HCT: 38.7 % — ABNORMAL LOW (ref 39.0–52.0)
Hemoglobin: 12.9 g/dL — ABNORMAL LOW (ref 13.0–17.0)
MCH: 31.6 pg (ref 26.0–34.0)
MCHC: 33.3 g/dL (ref 30.0–36.0)
MCV: 94.9 fL (ref 80.0–100.0)
Platelets: 229 10*3/uL (ref 150–400)
RBC: 4.08 MIL/uL — ABNORMAL LOW (ref 4.22–5.81)
RDW: 13.3 % (ref 11.5–15.5)
WBC: 15.9 10*3/uL — ABNORMAL HIGH (ref 4.0–10.5)
nRBC: 0 % (ref 0.0–0.2)

## 2023-05-09 LAB — ETHANOL: Alcohol, Ethyl (B): <10 mg/dL (ref <10)

## 2023-05-09 LAB — ACETAMINOPHEN LEVEL: Acetaminophen (Tylenol), Serum: <10 ug/mL — ABNORMAL LOW (ref 10–30)

## 2023-05-09 LAB — SALICYLATE LEVEL: Salicylate Lvl: <7 mg/dL — ABNORMAL LOW (ref 7.0–30.0)

## 2023-05-09 NOTE — ED Notes (Addendum)
Pt changed into psych scrubs and socks. Pt belongings include:  1 - grey sweatshirt,  1 - green long sleeve shirt 1 - black short sleeve shirt 1 - yellow hair band 2 - black pants 1 - red shorts 1 - black underwear 1 - black shoes   Bag of medication also brought by patient. Will send to pharmacy.

## 2023-05-09 NOTE — BH Assessment (Signed)
Comprehensive Clinical Assessment (CCA) Note  05/09/2023 Anthony Luna [id number] Recommendations for Services/Supports/Treatments: Psych NP Rashaun D. determined pt. meets psychiatric inpatient criteria. Anthony Luna is a 37 year old, English speaking, Black male with a hx of Schizoaffective Disorder, Bipolar Type and cannabis use disorder, severe dependence. Pt presented to [facility] ED VOL. Per triage note: Pt presents voluntarily with case manager [name] [phone]) with community support team of [place]. Pt reportedly has been wandering the streets, sleeping outside, having erratic behaviors. Pt endorses suicidal ideation with plan to "get a knife". Pt reports auditory hallucinations, not command in nature.     On assessment, pt. presented with flight of ideas. Pt's thoughts were irrelevant, with loose associations. When asked what brought pt. to the ED the pt. stated, "I was hearing voices. I feel much better now." Pt had fair insight and poor judgment. Pt was impulsive throughout the assessment. Of note, pt. had a personable and silly demeanor. Pt had a euphoric affect, and a hypomanic mood. Pt was scattered and distractible. Pt denied current SI, HI; however pt admitted to suicidal ideation prior to arrival. Pt reported having a plan to stand on the train tracks. Pt endorsed having AV/H explaining that the voices tell him to help people. BAL is unremarkable; UDS + for cannabis. Pt reported that he is medication compliant. Pt reported that he wanders around to different people's houses, often doesn't eat, and sleeps in the bushes.  Chief Complaint:  Chief Complaint  Patient presents with   Suicidal   Visit Diagnosis: Schizoaffective disorder, bipolar type (HCC)     CCA Screening, Triage and Referral (STR)  Patient Reported Information How did you hear about [place]? Self  Referral name: No data recorded Referral phone number: No data recorded  Whom do you see for  routine medical problems? No data recorded Practice/Facility Name: No data recorded Practice/Facility Phone Number: No data recorded Name of Contact: No data recorded Contact Number: No data recorded Contact Fax Number: No data recorded Prescriber Name: No data recorded Prescriber Address (if known): No data recorded  What Is the Reason for Your Visit/Call Today? Pt to ED with BPD voluntarily, Pt states it is cold outside and he has no food or money, pt denies SI denies hearing voices or seeing things but states he would like to speak to psychiatrist while he is here. Pt has hx schizophrenia, states he has been taking prescribed medications. Pt talking erratic  in triage but is calm and cooperative. Pt yelling out random numbers during triage process and asking how to spell the letter "E" pt will begin talking and stop mid sentence and stare straight forward. Pt states he saw scream 7 last night and since has felt like someone is out to get him. Pt licking his own blood in triage.  How Long Has This Been Causing You Problems? > than 6 months  What Do You Feel Would Help You the Most Today? Social Support; Food Assistance   Have You Recently Been in Any Inpatient Treatment (Hospital/Detox/Crisis Center/28-Day Program)? No data recorded Name/Location of Program/Hospital:No data recorded How Long Were You There? No data recorded When Were You Discharged? No data recorded  Have You Ever Received Services From [facility] Before? No data recorded Who Do You See at [facility]? No data recorded  Have You Recently Had Any Thoughts About Hurting Yourself? No  Are You Planning to Commit Suicide/Harm Yourself At This time? No   Have you Recently Had Thoughts About  Hurting Someone [name]? No  Explanation: Pt reported he came to the hospital to get out of the streets.   Have You Used Any Alcohol or Drugs in the Past 24 Hours? Yes  How Long Ago Did You Use Drugs or Alcohol? No data  recorded What Did You Use and How Much? Cannabis   Do You Currently Have a Therapist/Psychiatrist? No  Name of Therapist/Psychiatrist: Unable to recall   Have You Been Recently Discharged From Any Office Practice or Programs? Yes  Explanation of Discharge From Practice/Program: Pt recently discharged from [facility].     CCA Screening Triage Referral Assessment Type of Contact: Face-to-Face  Is this Initial or Reassessment? No data recorded Date Telepsych consult ordered in [facility]:  No data recorded Time Telepsych consult ordered in [facility]:  No data recorded  Patient Reported Information Reviewed? No data recorded Patient Left Without Being Seen? No data recorded Reason for Not Completing Assessment: No data recorded  Collateral Involvement: None provided   Does Patient Have a Court Appointed Legal Guardian? No data recorded Name and Contact of Legal Guardian: No data recorded If Minor and Not Living with Parent(s), Who has Custody? n/a  Is CPS involved or ever been involved? Never  Is APS involved or ever been involved? Never   Patient Determined To Be At Risk for Harm To Self or Others Based on Review of Patient Reported Information or Presenting Complaint? No  Method: No Plan  Availability of Means: No access or NA  Intent: Vague intent or NA  Notification Required: No need or identified person  Additional Information for Danger to Others Potential: Active psychosis  Additional Comments for Danger to Others Potential: n/a  Are There Guns or Other Weapons in Your Home? No  Types of Guns/Weapons: n/a  Are These Weapons Safely Secured?                            No  Who Could Verify You Are Able To Have These Secured: n/a  Do You Have any Outstanding Charges, Pending Court Dates, Parole/Probation? None reported  Contacted To Inform of Risk of Harm To Self or Others: Other: Comment   Location of Assessment: [facility] ED   Does Patient Present under Involuntary  Commitment? No  IVC Papers Initial File Date: No data recorded  [place] of Residence: [place]   Patient Currently Receiving the Following Services: Not Receiving Services   Determination of Need: Emergent (2 hours)   Options For Referral: ED Visit     CCA Biopsychosocial Intake/Chief Complaint:  No data recorded Current Symptoms/Problems: No data recorded  Patient Reported Schizophrenia/Schizoaffective Diagnosis in Past: Yes   Strengths: Have a support system, have stable housing and some insight.  Preferences: No data recorded Abilities: No data recorded  Type of Services Patient Feels are Needed: No data recorded  Initial Clinical Notes/Concerns: No data recorded  Mental Health Symptoms Depression:   Change in energy/activity; Irritability; Worthlessness   Duration of Depressive symptoms:  Less than two weeks   Mania:   Increased Energy; Irritability; Racing thoughts; Overconfidence; Recklessness   Anxiety:    Irritability; Restlessness   Psychosis:   Other negative symptoms   Duration of Psychotic symptoms:  Greater than six months   Trauma:   N/A   Obsessions:   N/A   Compulsions:   N/A   Inattention:   N/A   Hyperactivity/Impulsivity:   N/A   Oppositional/Defiant Behaviors:   Easily  annoyed   Emotional Irregularity:   N/A   Other Mood/Personality Symptoms:  No data recorded   Mental Status Exam Appearance and self-care  Stature:   Average   Weight:   Average weight   Clothing:   Neat/clean; Age-appropriate   Grooming:   Normal   Cosmetic use:   None   Posture/gait:   Normal   Motor activity:   -- (Within normal range)   Sensorium  Attention:   Distractible; Confused   Concentration:   Preoccupied   Orientation:   Person   Recall/memory:   Defective in Remote; Defective in Recent; Defective in Short-term; Defective in Immediate   Affect and Mood  Affect:   Appropriate; Depressed   Mood:    Irritable   Relating  Eye contact:   Normal   Facial expression:   Responsive   Attitude toward examiner:   Suspicious; Critical   Thought and Language  Speech flow:  Pressured; Flight of Ideas   Thought content:   Appropriate to Mood and Circumstances   Preoccupation:   None   Hallucinations:   None   Organization:  No data recorded  [employer] of Knowledge:   Fair   Intelligence:   Average   Abstraction:   Functional   Judgement:   Impaired   Reality Testing:   Distorted   Insight:   Poor   Decision Making:   Impulsive   Social Functioning  Social Maturity:   Impulsive; Irresponsible   Social Judgement:   "Chief of Staff"; Heedless   Stress  Stressors:   Transitions   Coping Ability:   Exhausted; Overwhelmed   Skill Deficits:   Decision making   Supports:   Family     Religion:    Leisure/Recreation:    Exercise/Diet:     CCA Employment/Education Employment/Work Situation:    Education:     CCA Family/Childhood History Family and Relationship History:    Childhood History:     Child/Adolescent Assessment:     CCA Substance Use Alcohol/Drug Use:                           ASAM's:  Six Dimensions of Multidimensional Assessment  Dimension 1:  Acute Intoxication and/or Withdrawal Potential:      Dimension 2:  Biomedical Conditions and Complications:      Dimension 3:  Emotional, Behavioral, or Cognitive Conditions and Complications:     Dimension 4:  Readiness to Change:     Dimension 5:  Relapse, Continued use, or Continued Problem Potential:     Dimension 6:  Recovery/Living Environment:     ASAM Severity Score:    ASAM Recommended Level of Treatment:     Substance use Disorder (SUD)    Recommendations for Services/Supports/Treatments:    DSM5 Diagnoses: Patient Active Problem List   Diagnosis Date Noted   Acute psychosis (HCC) 04/16/2023   Auditory hallucination  03/25/2023   Schizophrenia (HCC) 04/28/2022   Suicidal ideation 12/14/2018   Cannabis use disorder, severe, dependence (HCC) 11/30/2014   Schizoaffective disorder, bipolar type (HCC) 11/30/2014   Tobacco use disorder 11/30/2014   Chyane Greer R Francille Wittmann, LCAS

## 2023-05-09 NOTE — ED Notes (Signed)
2 Blister pack ( Benztropine, Bupropn, Cetirizine, Docusate, Melatonin, Montelukast, Propranolol, Multi vitamin, Vitamin D3 Vitamin B) 1 Blister pack Klonopin 1 Blister pack clozapine 1 Blister pack Benztropine Triamcinolone Nasal spray

## 2023-05-09 NOTE — ED Triage Notes (Signed)
Pt presents voluntarily with case manager [name] [phone]) with community support team of [place]. Pt reportedly has been wandering the streets, sleeping outside, having erratic behaviors. Pt endorses suicidal ideation with plan to "get a knife". Pt reports auditory hallucinations, not command in nature.

## 2023-05-09 NOTE — ED Provider Notes (Signed)
[facility] Provider Note    Event Date/Time   First MD Initiated Contact with Patient 05/09/23 1752     (approximate)   History   Suicidal   HPI  Anthony Luna is a 37 y.o. male with a history of schizoaffective disorder, bipolar type, who presents with suicidal ideation.  In triage, the patient reported SI and stated he was hearing voices although they were not commanding him to do anything.  The patient declines to give me other history, only nodding to yes and no questions.  He denies having ongoing SI.  He confirms hearing voices.  He denies any acute medical complaints.  I the past medical records.  The patient was most recently evaluated by psychiatry on 11/12 after presenting voluntarily.  He was placed under IVC and recommended for inpatient psychiatric admission at that time.   Physical Exam   Triage Vital Signs: ED Triage Vitals  Encounter Vitals Group     BP 05/09/23 1737 123/74     Systolic BP Percentile --      Diastolic BP Percentile --      Pulse Rate 05/09/23 1737 (!) 101     Resp 05/09/23 1737 14     Temp 05/09/23 1737 98.7 F (37.1 C)     Temp Source 05/09/23 1737 Oral     SpO2 05/09/23 1737 96 %     Weight --      Height --      Head Circumference --      Peak Flow --      Pain Score 05/09/23 1738 0     Pain Loc --      Pain Education --      Exclude from Growth Chart --     Most recent vital signs: Vitals:   05/09/23 1737  BP: 123/74  Pulse: (!) 101  Resp: 14  Temp: 98.7 F (37.1 C)  SpO2: 96%    General: Awake, no distress.  CV:  Good peripheral perfusion. Resp:  Normal effort.  Abd:  No distention.  Other:  Calm and cooperative.  Flat affect.   ED Results / Procedures / Treatments   Labs (all labs ordered are listed, but only abnormal results are displayed) Labs Reviewed  COMPREHENSIVE METABOLIC PANEL - Abnormal; Notable for the following components:      Result Value   BUN 22 (*)    Calcium  8.5 (*)    All other components within normal limits  SALICYLATE LEVEL - Abnormal; Notable for the following components:   Salicylate Lvl <7.0 (*)    All other components within normal limits  ACETAMINOPHEN LEVEL - Abnormal; Notable for the following components:   Acetaminophen (Tylenol), Serum <10 (*)    All other components within normal limits  CBC - Abnormal; Notable for the following components:   WBC 15.9 (*)    RBC 4.08 (*)    Hemoglobin 12.9 (*)    HCT 38.7 (*)    All other components within normal limits  URINE DRUG SCREEN, QUALITATIVE ([facility] ONLY) - Abnormal; Notable for the following components:   Cannabinoid 50 Ng, Ur [place] POSITIVE (*)    All other components within normal limits  ETHANOL     EKG     RADIOLOGY    PROCEDURES:  Critical Care performed: No  Procedures   MEDICATIONS ORDERED IN ED: Medications - No data to display   IMPRESSION / MDM / ASSESSMENT AND PLAN / ED COURSE  I reviewed the triage vital signs and the nursing notes.   Differential diagnosis includes, but is not limited to, exacerbation of schizoaffective disorder, other acute psychosis or mood disorder, substance abuse.  I have ordered psychiatry and TTS consults to evaluate the patient.  At this time the patient does not endorse active SI.  He presented here voluntarily and does not demonstrate acute danger to self or others so I have not placed him under IVC.  Lab workup was obtained for screening.  UDS is positive for cannabinoids.  APAP and ASA levels are negative.  Ethanol is negative.  Patient's presentation is most consistent with severe exacerbation of chronic illness.  The patient has been placed in psychiatric observation due to the need to provide a safe environment for the patient while obtaining psychiatric consultation and evaluation, as well as ongoing medical and medication management to treat the patient's condition.  The patient has not been placed under full IVC at this  time.   ----------------------------------------- 10:43 PM on 05/09/2023 -----------------------------------------  Physician recommendations are pending from psychiatry.  I will sign the patient out to the oncoming ED physician at 11 PM.   FINAL CLINICAL IMPRESSION(S) / ED DIAGNOSES   Final diagnoses:  Suicidal ideation     Rx / DC Orders   ED Discharge Orders     None        Note:  This document was prepared using Dragon voice recognition software and may include unintentional dictation errors.    Dionne Bucy, MD 05/09/23 2243

## 2023-05-09 NOTE — ED Notes (Signed)
Pt is calm and cooperative. Pt denies any pain. Pt declined to eat dinner tray. Warm blanket was given by this nurse.

## 2023-05-09 NOTE — Consult Note (Signed)
Telepsych Consultation   Reason for Consult:  Psych Evaluation Referring Physician:  Dr. Marisa Severin Location of Patient: [facility] ER Location of Provider: Behavioral Health TTS Department  Patient Identification: Anthony Luna MRN:  [id number] Principal Diagnosis: Schizophrenia [facility]) Diagnosis:  Principal Problem:   Schizophrenia (HCC)   Total Time spent with patient: 30 minutes  Subjective: "I hear voices"   HPI:  Tele psych Assessment   Anthony Luna is a 37 year old, English speaking, Black male with a hx of Schizoaffective Disorder, Bipolar Type and cannabis use disorder, severe dependence presented to [facility] ED VOL, seen via tele health by TTS and this provider; chart reviewed and consulted with Dr. Marisa Severin on 05/09/23.  Per chart review, triage note states, pt presents voluntarily with case manager Anthony Luna [phone]) with community support team of [place]. Pt reportedly has been wandering the streets, sleeping outside, having erratic behaviors. Pt endorses suicidal ideation with plan to "get a knife". Pt reports auditory hallucinations, not command in nature.  On evaluation Anthony Luna reports that he was hallucinating and hearing voices.  He says he feels like people dont like him. He reports that he has been taking his medication. He admits to sleeping outside in the bushes.  He states that he called his case manager Case manager Anthony Luna.  was contacted for collateral.  Per TTS, on assessment, pt. presented with flight of ideas. Pt's thoughts were irrelevant, with loose associations. When asked what brought pt. to the ED the pt. stated, "I was hearing voices. I feel much better now." Pt had fair insight and poor judgment. Pt was impulsive throughout the assessment. Of note, pt. had a personable and silly demeanor. Pt had a euphoric affect, and a hypomanic mood. Pt was scattered and distractible. Pt denied current SI, HI; however pt admitted to  suicidal ideation prior to arrival. Pt reported having a plan to stand on the train tracks. Pt endorsed having AV/H explaining that the voices tell him to help people. BAL is unremarkable; UDS + for cannabis. Pt reported that he is medication compliant. Pt reported that he wanders around to different people's houses, often doesn't eat, and sleeps in the bushes.  During evaluation Anthony Luna is sitting in the assessment chair; he is alert/oriented x 4; euphoric/cooperative; and mood congruent with affect.  Patient is speaking in a clear tone at moderate volume, and normal pace; with good eye contact.  His thought process is incoherent and sometimes irrelevant; There is no indication that he is currently responding to internal/external stimuli, although at times he would stare blankly and thought block.  He admits to reacting to hallucinations and attempting to steal someone's car.  He admits to having and acting delusional thought content.  Patient denies suicidal/self-harm at this time but admitted to wanting to stand in front of  a train prior to coming to the ER. He denies homicidal ideation.  He is currently experiencing psychosis, and paranoia.    Recommendations: Psychiatric inpatient hospitalization   Dr. Marisa Severin informed of above recommendation and disposition  Past Psychiatric History: Schizophrenia  Risk to Self:   Risk to Others:   Prior Inpatient Therapy:   Prior Outpatient Therapy:    Past Medical History:  Past Medical History:  Diagnosis Date   Depression    Since moving into new apartment 1 year ago   None to low serum cortisol response with adrenocorticotrophic hormone (ACTH) stimulation test    Schizophrenia, paranoid type (HCC)    No  past surgical history on file. Family History: No family history on file. Family Psychiatric  History: Unknown Social History:  Social History   Substance and Sexual Activity  Alcohol Use Yes   Alcohol/week: 0.0 standard  drinks of alcohol   Comment: "Special occasions"     Social History   Substance and Sexual Activity  Drug Use Yes   Frequency: 4.0 times per week   Types: Marijuana   Comment: "2 blunts"    Social History   Socioeconomic History   Marital status: Single    Spouse name: Not on file   Number of children: Not on file   Years of education: Not on file   Highest education level: Not on file  Occupational History   Not on file  Tobacco Use   Smoking status: Every Day    Current packs/day: 0.50    Average packs/day: 0.5 packs/day for 20.4 years (10.2 ttl pk-yrs)    Types: Cigarettes    Start date: 11/29/2002   Smokeless tobacco: Former   Tobacco comments:    does not want to quit  Vaping Use   Vaping status: Never Used  Substance and Sexual Activity   Alcohol use: Yes    Alcohol/week: 0.0 standard drinks of alcohol    Comment: "Special occasions"   Drug use: Yes    Frequency: 4.0 times per week    Types: Marijuana    Comment: "2 blunts"   Sexual activity: Not on file  Other Topics Concern   Not on file  Social History Narrative   Not on file   Social Determinants of Health   Financial Resource Strain: Not on file  Food Insecurity: No Food Insecurity (04/28/2022)   Hunger Vital Sign    Worried About Running Out of Food in the Last Year: Never true    Ran Out of Food in the Last Year: Never true  Transportation Needs: No Transportation Needs (04/28/2022)   PRAPARE - Administrator, Civil Service (Medical): No    Lack of Transportation (Non-Medical): No  Physical Activity: Not on file  Stress: Not on file  Social Connections: Not on file   Additional Social History:    Allergies:   Allergies  Allergen Reactions   Shellfish Allergy     Labs:  Results for orders placed or performed during the hospital encounter of 05/09/23 (from the past 48 hour(s))  Comprehensive metabolic panel     Status: Abnormal   Collection Time: 05/09/23  5:42 PM  Result  Value Ref Range   Sodium 137 135 - 145 mmol/L   Potassium 4.1 3.5 - 5.1 mmol/L   Chloride 104 98 - 111 mmol/L   CO2 24 22 - 32 mmol/L   Glucose, Bld 93 70 - 99 mg/dL    Comment: Glucose reference range applies only to samples taken after fasting for at least 8 hours.   BUN 22 (H) 6 - 20 mg/dL   Creatinine, Ser 1.30 0.61 - 1.24 mg/dL   Calcium 8.5 (L) 8.9 - 10.3 mg/dL   Total Protein 6.6 6.5 - 8.1 g/dL   Albumin 3.6 3.5 - 5.0 g/dL   AST 34 15 - 41 U/L   ALT 32 0 - 44 U/L   Alkaline Phosphatase 66 38 - 126 U/L   Total Bilirubin 0.6 <1.2 mg/dL   GFR, Estimated >86 >57 mL/min    Comment: (NOTE) Calculated using the CKD-EPI Creatinine Equation (2021)    Anion gap 9 5 - 15  Comment: Performed at [facility], [address] [place], [place] [address]  Ethanol     Status: None   Collection Time: 05/09/23  5:42 PM  Result Value Ref Range   Alcohol, Ethyl (B) <10 <10 mg/dL    Comment: (NOTE) Lowest detectable limit for serum alcohol is 10 mg/dL.  For medical purposes only. Performed at [facility], [address] [place], [place] [address]   Salicylate level     Status: Abnormal   Collection Time: 05/09/23  5:42 PM  Result Value Ref Range   Salicylate Lvl <7.0 (L) 7.0 - 30.0 mg/dL    Comment: Performed at [facility], [address] [place], [place] [address]  Acetaminophen level     Status: Abnormal   Collection Time: 05/09/23  5:42 PM  Result Value Ref Range   Acetaminophen (Tylenol), Serum <10 (L) 10 - 30 ug/mL    Comment: (NOTE) Therapeutic concentrations vary significantly. A range of 10-30 ug/mL  may be an effective concentration for many patients. However, some  are best treated at concentrations outside of this range. Acetaminophen concentrations >150 ug/mL at 4 hours after ingestion  and >50 ug/mL at 12 hours after ingestion are often associated with  toxic reactions.  Performed at [facility], [address]  [place], [place] [address]   cbc     Status: Abnormal   Collection Time: 05/09/23  5:42 PM  Result Value Ref Range   WBC 15.9 (H) 4.0 - 10.5 K/uL   RBC 4.08 (L) 4.22 - 5.81 MIL/uL   Hemoglobin 12.9 (L) 13.0 - 17.0 g/dL   HCT 29.5 (L) 62.1 - 30.8 %   MCV 94.9 80.0 - 100.0 fL   MCH 31.6 26.0 - 34.0 pg   MCHC 33.3 30.0 - 36.0 g/dL   RDW 65.7 84.6 - 96.2 %   Platelets 229 150 - 400 K/uL   nRBC 0.0 0.0 - 0.2 %    Comment: Performed at [facility], [address] [place], [place] [address]  Urine Drug Screen, Qualitative     Status: Abnormal   Collection Time: 05/09/23  5:42 PM  Result Value Ref Range   Tricyclic, Ur Screen NONE DETECTED NONE DETECTED   Amphetamines, Ur Screen NONE DETECTED NONE DETECTED   MDMA (Ecstasy)Ur Screen NONE DETECTED NONE DETECTED   Cocaine Metabolite,Ur [place] NONE DETECTED NONE DETECTED   Opiate, Ur Screen NONE DETECTED NONE DETECTED   Phencyclidine (PCP) Ur S NONE DETECTED NONE DETECTED   Cannabinoid 50 Ng, Ur [place] POSITIVE (A) NONE DETECTED   Barbiturates, Ur Screen NONE DETECTED NONE DETECTED   Benzodiazepine, Ur Scrn NONE DETECTED NONE DETECTED   Methadone Scn, Ur NONE DETECTED NONE DETECTED    Comment: (NOTE) Tricyclics + metabolites, urine    Cutoff 1000 ng/mL Amphetamines + metabolites, urine  Cutoff 1000 ng/mL MDMA (Ecstasy), urine              Cutoff 500 ng/mL Cocaine Metabolite, urine          Cutoff 300 ng/mL Opiate + metabolites, urine        Cutoff 300 ng/mL Phencyclidine (PCP), urine         Cutoff 25 ng/mL Cannabinoid, urine                 Cutoff 50 ng/mL Barbiturates + metabolites, urine  Cutoff 200 ng/mL Benzodiazepine, urine              Cutoff 200 ng/mL  Methadone, urine                   Cutoff 300 ng/mL  The urine drug screen provides only a preliminary, unconfirmed analytical test result and should not be used for non-medical purposes. Clinical consideration and professional judgment should be applied to any positive drug  screen result due to possible interfering substances. A more specific alternate chemical method must be used in order to obtain a confirmed analytical result. Gas chromatography / mass spectrometry (GC/MS) is the preferred confirm atory method. Performed at [facility], [address] [place], [place] [address]     Medications:  No current facility-administered medications for this encounter.   Current Outpatient Medications  Medication Sig Dispense Refill   albuterol (VENTOLIN HFA) 108 (90 Base) MCG/ACT inhaler Inhale 1-2 puffs into the lungs every 6 (six) hours as needed for shortness of breath. 18 g 1   benztropine (COGENTIN) 0.5 MG tablet Take 2 tablets (1 mg total) by mouth 2 (two) times daily. 60 tablet 1   buPROPion (WELLBUTRIN XL) 150 MG 24 hr tablet Take 1 tablet (150 mg total) by mouth daily. 30 tablet 1   cetirizine (ZYRTEC) 10 MG tablet Take 1 tablet (10 mg total) by mouth daily. 30 tablet 1   clonazePAM (KLONOPIN) 0.5 MG tablet Take 1 tablet (0.5 mg total) by mouth 2 (two) times daily. 60 tablet 1   cloZAPine (CLOZARIL) 100 MG tablet Take 300 mg by mouth at bedtime. Take along with one 50 mg tablet for total 350 mg at bedtime     cloZAPine (CLOZARIL) 50 MG tablet Take 7 tablets (350 mg total) by mouth at bedtime. (Patient not taking: Reported on 04/27/2023) 210 tablet 0   docusate sodium (COLACE) 100 MG capsule Take 1 capsule (100 mg total) by mouth 2 (two) times daily. 60 capsule 1   fluticasone (FLONASE) 50 MCG/ACT nasal spray Place 2 sprays into both nostrils daily as needed for allergies or rhinitis. 16 g 1   hydrOXYzine (ATARAX) 25 MG tablet Take 1 tablet (25 mg total) by mouth 3 (three) times daily as needed for anxiety. 30 tablet 1   melatonin 5 MG TABS Take 1 tablet (5 mg total) by mouth at bedtime. (Patient taking differently: Take 3 mg by mouth at bedtime.) 30 tablet 1   montelukast (SINGULAIR) 10 MG tablet Take 1 tablet (10 mg total) by mouth at bedtime.  30 tablet 1   nicotine (NICODERM CQ - DOSED IN MG/24 HOURS) 21 mg/24hr patch Place 1 patch (21 mg total) onto the skin daily. 28 patch 1   Paliperidone Palmitate (INVEGA TRINZA) 819 MG/2.625ML SUSP Inject 819 mg into the muscle.     propranolol (INDERAL) 20 MG tablet Take 1 tablet (20 mg total) by mouth 2 (two) times daily. 60 tablet 1   Vitamin D, Ergocalciferol, (DRISDOL) 1.25 MG (50000 UNIT) CAPS capsule Take 1 capsule (50,000 Units total) by mouth every 7 (seven) days. (Patient not taking: Reported on 04/27/2023) 5 capsule 1    Musculoskeletal: Strength & Muscle Tone: within normal limits Gait & Station: normal Patient leans: N/A  Psychiatric Specialty Exam:  Presentation  General Appearance:  Bizarre  Eye Contact: Good  Speech: Clear and Coherent  Speech Volume: Normal  Handedness: Right   Mood and Affect  Mood: Euthymic  Affect: Full Range   Thought Process  Thought Processes: Disorganized  Descriptions of Associations:Loose  Orientation:Full (Time, Place and Person)  Thought Content:Illogical; Tangential; Scattered  History  of Schizophrenia/Schizoaffective disorder:Yes  Duration of Psychotic Symptoms:Greater than six months  Hallucinations:Hallucinations: Auditory Description of Auditory Hallucinations: voices telling him "various things"  Ideas of Reference:Paranoia; Percusatory  Suicidal Thoughts:Suicidal Thoughts: Yes, Active SI Active Intent and/or Plan: With Means to Carry Out  Homicidal Thoughts:Homicidal Thoughts: No   Sensorium  Memory: Immediate Poor; Remote Poor  Judgment: Impaired  Insight: Shallow   Executive Functions  Concentration: Poor  Attention Span: Poor  Recall: Poor  Fund of Knowledge: Poor  Language: Poor   Psychomotor Activity  Psychomotor Activity:Psychomotor Activity: Normal   Assets  Assets: Communication Skills; Desire for Improvement; Financial Resources/Insurance;  Resilience   Sleep  Sleep:Sleep: Poor    Physical Exam: Physical Exam ROS Blood pressure 123/74, pulse (!) 101, temperature 98.7 F (37.1 C), temperature source Oral, resp. rate 14, SpO2 96%. There is no height or weight on file to calculate BMI.  Treatment Plan Summary: Daily contact with patient to assess and evaluate symptoms and progress in treatment, Medication management, and Plan  Anthony Luna was admitted to [facility] ER for Schizophrenia [facility]), crisis management, and stabilization. Routine labs ordered, which include Lab Orders         Comprehensive metabolic panel         Ethanol         Salicylate level         Acetaminophen level         cbc         Urine Drug Screen, Qualitative    Medication Management: Medications started  Will maintain observation checks every 15 minutes for safety. Psychosocial education regarding relapse prevention and self-care; social and communication  Social work will consult with family for collateral information and discuss discharge and follow up plan.  Disposition: Recommend psychiatric Inpatient admission when medically cleared. Supportive therapy provided about ongoing stressors. Refer to IOP. Discussed crisis plan, support from social network, calling 911, coming to the Emergency Department, and calling Suicide Hotline.  This service was provided via telemedicine using a 2-way, interactive audio and video technology.    Jearld Lesch, NP 05/09/2023 11:24 PM

## 2023-05-09 NOTE — ED Notes (Signed)
Pt finished with dinner tray. Stated he would like to keep the rest for later. Tray left near stretcher. Pt currently laying on stretcher calm and cooperative.

## 2023-05-09 NOTE — ED Notes (Signed)
Pt states that he would like to go home. EDP Siadecki notified.  Pt in interview room with Psych at this time.

## 2023-05-09 NOTE — ED Notes (Signed)
Pt roomed to 24 hall before medications taken to pharmacy. All medications in one bag given to [facility] RN who assumed pt care, with stated plan of her taking them to pharmacy.

## 2023-05-10 ENCOUNTER — Encounter: Payer: Self-pay | Admitting: Psychiatric/Mental Health

## 2023-05-10 ENCOUNTER — Inpatient Hospital Stay
Admission: RE | Admit: 2023-05-10 | Discharge: 2023-05-18 | DRG: 885 | Disposition: A | Discharge status: Home / Self Care | Payer: MEDICAID | Attending: Psychiatry | Admitting: Psychiatry

## 2023-05-10 ENCOUNTER — Other Ambulatory Visit: Payer: Self-pay

## 2023-05-10 DIAGNOSIS — F209 Schizophrenia, unspecified: Principal | ICD-10-CM | POA: Diagnosis present

## 2023-05-10 DIAGNOSIS — Z59819 Housing instability, housed unspecified: Secondary | ICD-10-CM

## 2023-05-10 DIAGNOSIS — F200 Paranoid schizophrenia: Secondary | ICD-10-CM | POA: Diagnosis present

## 2023-05-10 DIAGNOSIS — Z5941 Food insecurity: Secondary | ICD-10-CM | POA: Diagnosis not present

## 2023-05-10 DIAGNOSIS — F201 Disorganized schizophrenia: Secondary | ICD-10-CM | POA: Diagnosis not present

## 2023-05-10 DIAGNOSIS — F319 Bipolar disorder, unspecified: Secondary | ICD-10-CM | POA: Diagnosis present

## 2023-05-10 DIAGNOSIS — Z91013 Allergy to seafood: Secondary | ICD-10-CM | POA: Diagnosis not present

## 2023-05-10 DIAGNOSIS — F17210 Nicotine dependence, cigarettes, uncomplicated: Secondary | ICD-10-CM | POA: Diagnosis present

## 2023-05-10 DIAGNOSIS — Z79899 Other long term (current) drug therapy: Secondary | ICD-10-CM

## 2023-05-10 DIAGNOSIS — I10 Essential (primary) hypertension: Secondary | ICD-10-CM | POA: Diagnosis present

## 2023-05-10 DIAGNOSIS — F203 Undifferentiated schizophrenia: Secondary | ICD-10-CM | POA: Diagnosis not present

## 2023-05-10 DIAGNOSIS — G20C Parkinsonism, unspecified: Secondary | ICD-10-CM | POA: Diagnosis present

## 2023-05-10 DIAGNOSIS — F1220 Cannabis dependence, uncomplicated: Secondary | ICD-10-CM | POA: Diagnosis present

## 2023-05-10 DIAGNOSIS — R45851 Suicidal ideations: Secondary | ICD-10-CM | POA: Diagnosis present

## 2023-05-10 DIAGNOSIS — Z5982 Transportation insecurity: Secondary | ICD-10-CM | POA: Diagnosis not present

## 2023-05-10 MED ORDER — TRAZODONE HCL 50 MG PO TABS
50.0000 mg | ORAL_TABLET | Freq: Every evening | ORAL | Status: DC | PRN
  Administered 2023-05-12 – 2023-05-17 (×4): 50 mg via ORAL
  Filled 2023-05-10 (×5): qty 1

## 2023-05-10 MED ORDER — MAGNESIUM HYDROXIDE 400 MG/5ML PO SUSP: 30.0000 mL | Freq: Every day | ORAL | Status: DC | PRN

## 2023-05-10 MED ORDER — MONTELUKAST SODIUM 10 MG PO TABS
10.0000 mg | ORAL_TABLET | Freq: Every day | ORAL | Status: DC
  Administered 2023-05-10 – 2023-05-17 (×7): 10 mg via ORAL
  Filled 2023-05-10 (×9): qty 1

## 2023-05-10 MED ORDER — OLANZAPINE 10 MG PO TABS
10.0000 mg | ORAL_TABLET | Freq: Four times a day (QID) | ORAL | Status: DC | PRN
  Administered 2023-05-15 (×2): 10 mg via ORAL
  Filled 2023-05-10 (×2): qty 1

## 2023-05-10 MED ORDER — ACETAMINOPHEN 325 MG PO TABS: 650.0000 mg | ORAL_TABLET | Freq: Four times a day (QID) | ORAL | Status: DC | PRN

## 2023-05-10 MED ORDER — HALOPERIDOL LACTATE 5 MG/ML IJ SOLN: 5.0000 mg | Freq: Three times a day (TID) | INTRAMUSCULAR | Status: DC | PRN

## 2023-05-10 MED ORDER — LORAZEPAM 2 MG/ML IJ SOLN: 2.0000 mg | Freq: Three times a day (TID) | INTRAMUSCULAR | Status: DC | PRN

## 2023-05-10 MED ORDER — VITAMIN D (ERGOCALCIFEROL) 1.25 MG (50000 UNIT) PO CAPS
50000.0000 [IU] | ORAL_CAPSULE | ORAL | Status: DC
  Filled 2023-05-10: qty 1

## 2023-05-10 MED ORDER — NICOTINE 14 MG/24HR TD PT24
14.0000 mg | MEDICATED_PATCH | Freq: Every day | TRANSDERMAL | Status: DC
  Administered 2023-05-11 – 2023-05-18 (×7): 14 mg via TRANSDERMAL
  Filled 2023-05-10 (×9): qty 1

## 2023-05-10 MED ORDER — BUPROPION HCL ER (XL) 150 MG PO TB24
150.0000 mg | ORAL_TABLET | Freq: Every day | ORAL | Status: DC
  Administered 2023-05-10 – 2023-05-18 (×9): 150 mg via ORAL
  Filled 2023-05-10 (×9): qty 1

## 2023-05-10 MED ORDER — DIPHENHYDRAMINE HCL 50 MG/ML IJ SOLN: 50.0000 mg | Freq: Three times a day (TID) | INTRAMUSCULAR | Status: DC | PRN

## 2023-05-10 MED ORDER — BENZTROPINE MESYLATE 1 MG PO TABS
1.0000 mg | ORAL_TABLET | Freq: Two times a day (BID) | ORAL | Status: DC
  Administered 2023-05-10 – 2023-05-18 (×17): 1 mg via ORAL
  Filled 2023-05-10 (×17): qty 1

## 2023-05-10 MED ORDER — CLONAZEPAM 0.5 MG PO TABS
0.5000 mg | ORAL_TABLET | Freq: Two times a day (BID) | ORAL | Status: DC
  Administered 2023-05-10 – 2023-05-18 (×17): 0.5 mg via ORAL
  Filled 2023-05-10 (×17): qty 1

## 2023-05-10 MED ORDER — RISPERIDONE 1 MG PO TABS
1.0000 mg | ORAL_TABLET | ORAL | Status: DC
  Administered 2023-05-10 – 2023-05-18 (×17): 1 mg via ORAL
  Filled 2023-05-10 (×17): qty 1

## 2023-05-10 MED ORDER — HYDROXYZINE HCL 25 MG PO TABS
25.0000 mg | ORAL_TABLET | Freq: Three times a day (TID) | ORAL | Status: DC | PRN
  Administered 2023-05-12 – 2023-05-15 (×5): 25 mg via ORAL
  Filled 2023-05-10 (×5): qty 1

## 2023-05-10 MED ORDER — MELATONIN 5 MG PO TABS
5.0000 mg | ORAL_TABLET | Freq: Every day | ORAL | Status: DC
  Administered 2023-05-10 – 2023-05-17 (×7): 5 mg via ORAL
  Filled 2023-05-10 (×7): qty 1

## 2023-05-10 MED ORDER — PROPRANOLOL HCL 20 MG PO TABS
20.0000 mg | ORAL_TABLET | Freq: Two times a day (BID) | ORAL | Status: DC
  Administered 2023-05-10 – 2023-05-18 (×16): 20 mg via ORAL
  Filled 2023-05-10 (×18): qty 1

## 2023-05-10 MED ORDER — ALUM & MAG HYDROXIDE-SIMETH 200-200-20 MG/5ML PO SUSP
30.0000 mL | ORAL | Status: DC | PRN
  Administered 2023-05-16: 30 mL via ORAL
  Filled 2023-05-10: qty 30

## 2023-05-10 NOTE — Plan of Care (Signed)
  Problem: Education: Goal: Emotional status will improve 05/10/2023 0810 by Trula Ore, RN Outcome: Progressing 05/10/2023 0806 by Trula Ore, RN Outcome: Progressing   Problem: Education: Goal: Verbalization of understanding the information provided will improve Outcome: Progressing

## 2023-05-10 NOTE — ED Notes (Signed)
Belonging bags 1 and 2 of 2 sent with pt, NT, and security to [facility]U.

## 2023-05-10 NOTE — Plan of Care (Signed)
  Problem: Education: Goal: Knowledge of [facility] Education information/materials will improve Outcome: Progressing   Problem: Education: Goal: Emotional status will improve Outcome: Progressing   Problem: Education: Goal: Mental status will improve Outcome: Progressing   

## 2023-05-10 NOTE — Plan of Care (Signed)
Problem: Coping: Goal: Ability to demonstrate self-control will improve Outcome: Progressing   Problem: Health Behavior/Discharge Planning: Goal: Compliance with treatment plan for underlying cause of condition will improve Outcome: Progressing   Problem: Safety: Goal: Periods of time without injury will increase Outcome: Progressing

## 2023-05-10 NOTE — Group Note (Signed)
Date:  05/10/2023 Time:  9:09 PM  Group Topic/Focus:  Building Self Esteem:   The Focus of this group is helping patients become aware of the effects of self-esteem on their lives, the things they and others do that enhance or undermine their self-esteem, seeing the relationship between their level of self-esteem and the choices they make and learning ways to enhance self-esteem.    Participation Level:  Did Not Attend  Participation Quality:   none  Affect:   none  Cognitive:   none  Insight: None  Engagement in Group:   none  Modes of Intervention:   none  Additional Comments:  none   Phung Kotas 05/10/2023, 9:09 PM

## 2023-05-10 NOTE — BHH Suicide Risk Assessment (Signed)
[facility] Admission Suicide Risk Assessment   Nursing information obtained from:    Demographic factors:  Male, Low socioeconomic status, Unemployed, Living alone Current Mental Status:  NA Loss Factors:  Loss of significant relationship Historical Factors:  Impulsivity Risk Reduction Factors:  NA  Total Time spent with patient: 1 hour Principal Problem: Schizophrenia (HCC) Diagnosis:  Principal Problem:   Schizophrenia (HCC)  Subjective Data: Anthony Luna is a 37 year old African-American male with a hx of schizophrenia, and cannabis use disorder, severe dependence presented to [facility] emergency room.  He presents voluntarily with case manager Anthony Luna [phone]) with community support team of [place]. Pt reportedly has been wandering the streets, sleeping outside, having erratic behaviors. Pt endorses suicidal ideation with plan to "get a knife". Pt reports auditory hallucinations, not command in nature.  On evaluation Anthony Luna reports that he was hallucinating and hearing voices.  He says he feels like people dont like him. He reports that he has been taking his medication. He admits to sleeping outside in the bushes.  He states that he called his case manager Case manager Anthony Luna.  He endorses anhedonia, difficulty sleeping, depressed mood, and suicidal thoughts.  He was recently discharged from [facility] last Wednesday and was placed on Risperdal, propranolol and Cogentin.  He was last admitted to [facility] on 04/28/2022 after running out of his Clozaril and Klonopin.  He has a diagnosis of schizophrenia.    Continued Clinical Symptoms:  Alcohol Use Disorder Identification Test Final Score (AUDIT): 4 The "Alcohol Use Disorders Identification Test", Guidelines for Use in Primary Care, Second Edition.  World Science writer [facility]). Score between 0-7:  no or low risk or alcohol related problems. Score between 8-15:  moderate risk of alcohol related problems. Score between  16-19:  high risk of alcohol related problems. Score 20 or above:  warrants further diagnostic evaluation for alcohol dependence and treatment.   CLINICAL FACTORS:   Depression:   Anhedonia Schizophrenia:   Paranoid or undifferentiated type   Musculoskeletal: Strength & Muscle Tone: within normal limits Gait & Station: normal Patient leans: N/A  Psychiatric Specialty Exam:  Presentation  General Appearance:  Bizarre  Eye Contact: Good  Speech: Clear and Coherent  Speech Volume: Normal  Handedness: Right   Mood and Affect  Mood: Euthymic  Affect: Full Range   Thought Process  Thought Processes: Disorganized  Descriptions of Associations:Loose  Orientation:Full (Time, Place and Person)  Thought Content:Illogical; Tangential; Scattered  History of Schizophrenia/Schizoaffective disorder:Yes  Duration of Psychotic Symptoms:Greater than six months  Hallucinations:Hallucinations: Auditory Description of Auditory Hallucinations: voices telling him "various things"  Ideas of Reference:Paranoia; Percusatory  Suicidal Thoughts:Suicidal Thoughts: Yes, Active SI Active Intent and/or Plan: With Means to Carry Out  Homicidal Thoughts:Homicidal Thoughts: No   Sensorium  Memory: Immediate Poor; Remote Poor  Judgment: Impaired  Insight: Shallow   Executive Functions  Concentration: Poor  Attention Span: Poor  Recall: Poor  Fund of Knowledge: Poor  Language: Poor   Psychomotor Activity  Psychomotor Activity: Psychomotor Activity: Normal   Assets  Assets: Communication Skills; Desire for Improvement; Financial Resources/Insurance; Resilience   Sleep  Sleep: Sleep: Poor   Blood pressure 128/81, pulse 99, temperature 98.7 F (37.1 C), temperature source Oral, resp. rate 18, height 6' (1.829 m), weight 82.1 kg, SpO2 98%. Body mass index is 24.55 kg/m.   COGNITIVE FEATURES THAT CONTRIBUTE TO RISK:  None    SUICIDE RISK:    Minimal: No identifiable suicidal ideation.  Patients presenting with  no risk factors but with morbid ruminations; may be classified as minimal risk based on the severity of the depressive symptoms  PLAN OF CARE: See orders  I certify that inpatient services furnished can reasonably be expected to improve the patient's condition.   Sarina Ill, DO 05/10/2023, 11:52 AM

## 2023-05-10 NOTE — BH Assessment (Signed)
Patient is to be admitted to [facility] by Psychiatric Nurse Practitioner Lerry Liner.  Attending Physician will be Dr. Marlou Porch.   Patient has been assigned to room 320, by [facility].    Intake Paper Work has been signed and placed on patient chart.  ER staff is aware of the admission: Inetta Fermo, ER Secretary   Dr. Elesa Massed, ER MD  Herbert Seta, Patient's Nurse

## 2023-05-10 NOTE — Progress Notes (Signed)
Pt presents with a bizarre affect. Pt observed interacting appropriately with staff and peers on the unit. Pt compliant with medication administration per MD orders. Pt given education, support, and encouragement to be active in his treatment plan. Pt being monitored Q 15 minutes for safety per unit protocol, remains safe on the unit

## 2023-05-10 NOTE — Progress Notes (Addendum)
Patient is animated at approach. Patient is able to answer questions appropriately but with possible thought blocking at times and is pleasantly disorganized. Patient is med compliant and overly friendly. Patient stated to RN earlier this morning "Im having trouble getting off after the medication." When further questioning what he meant, patient responded "I think I need some assistance" "Im having trouble thinking." Patient also observed adjusting himself with his hand underneath his pants. Patient able to be redirected to appropriate behavior and no more occurrences thus far. Patient observed in dayroom listening to music and is cooperative in unit. Denies SI,HI, and A/V/H at this time.No aggressive or violent behavior. No s/s of current distress.

## 2023-05-10 NOTE — Tx Team (Signed)
Initial Treatment Plan 05/10/2023 5:04 AM Anthony Luna ONG:[id number]    PATIENT STRESSORS: Financial difficulties   Substance abuse   Other: Housing      PATIENT STRENGTHS: Motivation for treatment/growth    PATIENT IDENTIFIED PROBLEMS: Schizophrenia  Homeless   Substance abuse                  DISCHARGE CRITERIA:  Improved stabilization in mood, thinking, and/or behavior Motivation to continue treatment in a less acute level of care  PRELIMINARY DISCHARGE PLAN: Outpatient therapy  PATIENT/FAMILY INVOLVEMENT: This treatment plan has been presented to and reviewed with the patient, Anthony Luna, The patient and family have been given the opportunity to ask questions and make suggestions.  Trula Ore, RN 05/10/2023, 5:04 AM

## 2023-05-10 NOTE — Group Note (Signed)
Date:  05/10/2023 Time:  6:02 PM  Group Topic/Focus:  OUTDOOR RECREATION STRUCTURED ACTIVITY    Participation Level:  Active  Participation Quality:  Appropriate  Affect:  Appropriate  Cognitive:  Appropriate  Insight: Appropriate  Engagement in Group:  Developing/Improving and Engaged  Modes of Intervention:  Activity  Additional Comments:    Jaelynne Hockley 05/10/2023, 6:02 PM

## 2023-05-10 NOTE — Group Note (Signed)
Date:  05/10/2023 Time:  1:16 PM  Group Topic/Focus:  Goals Group:   The focus of this group is to help patients establish daily goals to achieve during treatment and discuss how the patient can incorporate goal setting into their daily lives to aide in recovery. Group community meeting    Participation Level:  Active  Participation Quality:  Appropriate  Affect:  Appropriate  Cognitive:  Appropriate  Insight: Appropriate  Engagement in Group:  Developing/Improving  Modes of Intervention:  Activity  Additional Comments:    Lillie Bollig 05/10/2023, 1:16 PM

## 2023-05-10 NOTE — H&P (Signed)
Psychiatric Admission Assessment Adult  Patient Identification: Anthony Luna MRN:  [id number] Date of Evaluation:  05/10/2023 Chief Complaint:  Schizophrenia (HCC) [F20.9] Principal Diagnosis: Schizophrenia (HCC) Diagnosis:  Principal Problem:   Schizophrenia (HCC)  History of Present Illness: Anthony Luna is a 37 year old African-American male with a hx of schizophrenia, and cannabis use disorder, severe dependence presented to [facility] emergency room.  He presents voluntarily with case manager [name] [phone]) with community support team of [place]. Pt reportedly has been wandering the streets, sleeping outside, having erratic behaviors. Pt endorses suicidal ideation with plan to "get a knife". Pt reports auditory hallucinations, not command in nature.  On evaluation Anthony Luna reports that he was hallucinating and hearing voices.  He says he feels like people dont like him. He reports that he has been taking his medication. He admits to sleeping outside in the bushes.  He states that he called his case manager Case manager [name].  He endorses anhedonia, difficulty sleeping, depressed mood, and suicidal thoughts.  He was recently discharged from [facility] last Wednesday and was placed on Risperdal, propranolol and Cogentin.  He was last admitted to [facility] on 04/28/2022 after running out of his Clozaril and Klonopin.  He has a diagnosis of schizophrenia.    Associated Signs/Symptoms: Depression Symptoms:  depressed mood, suicidal thoughts without plan, (Hypo) Manic Symptoms:  Hallucinations, Anxiety Symptoms:  Excessive Worry, Psychotic Symptoms:  Hallucinations: Auditory PTSD Symptoms: NA Total Time spent with patient: 1 hour  Past Psychiatric History:  Patient has a long history of schizophrenia.  He used to be a frequent visitor to the hospital years ago but seems to have been more stable of late.  Last known hospitalization was to [facility] last week and  the summer 2022.  Before 2023, it had been about 3 years since we saw him.  Patient had been maintained on clozapine as well as long-acting Invega shot.  Patient has in the past claimed to have 1 distant suicide attempt by "head banging" but other than that has not been known to actually be physically dangerous to himself or others.  Has community support follow-up.  It looks like he has not been on Clozaril for a while.  Or Invega.  Is the patient at risk to self? Yes.    Has the patient been a risk to self in the past 6 months? Yes.    Has the patient been a risk to self within the distant past? Yes.    Is the patient a risk to others? No.  Has the patient been a risk to others in the past 6 months? No.  Has the patient been a risk to others within the distant past? No.   [place] Scale:  Flowsheet Row Admission (Current) from 05/10/2023 in [facility] ED from 05/09/2023 in [facility] at [facility] from 04/27/2023 in [facility] at [facility]  C-SSRS RISK CATEGORY High Risk High Risk High Risk        Prior Inpatient Therapy: Yes.   If yes, describe as above Prior Outpatient Therapy: Yes.   If yes, describe [facility]  Alcohol Screening: 1. How often do you have a drink containing alcohol?: 2 to 4 times a month 2. How many drinks containing alcohol do you have on a typical day when you are drinking?: 5 or 6 3. How often do you have six or more drinks on one occasion?: Never AUDIT-C Score: 4 4.  How often during the last year have you found that you were not able to stop drinking once you had started?: Never 5. How often during the last year have you failed to do what was normally expected from you because of drinking?: Never 6. How often during the last year have you needed a first drink in the morning to get yourself going after a heavy drinking session?: Never 7. How often during the last year have you had a  feeling of guilt of remorse after drinking?: Never 8. How often during the last year have you been unable to remember what happened the night before because you had been drinking?: Never 9. Have you or someone else been injured as a result of your drinking?: No 10. Has a relative or friend or a doctor or another health worker been concerned about your drinking or suggested you cut down?: No Alcohol Use Disorder Identification Test Final Score (AUDIT): 4 Alcohol Brief Interventions/Follow-up: Alcohol education/Brief advice Substance Abuse History in the last 12 months:  Yes.   Consequences of Substance Abuse: NA Previous Psychotropic Medications: Yes  Psychological Evaluations: Yes  Past Medical History:  Past Medical History:  Diagnosis Date   Depression    Since moving into new apartment 1 year ago   None to low serum cortisol response with adrenocorticotrophic hormone (ACTH) stimulation test    Schizophrenia, paranoid type (HCC)    History reviewed. No pertinent surgical history. Family History: History reviewed. No pertinent family history. Family Psychiatric  History: Unremarkable Tobacco Screening:  Social History   Tobacco Use  Smoking Status Every Day   Current packs/day: 0.50   Average packs/day: 0.5 packs/day for 20.4 years (10.2 ttl pk-yrs)   Types: Cigarettes   Start date: 11/29/2002  Smokeless Tobacco Former  Tobacco Comments   does not want to quit    BH Tobacco Counseling     Are you interested in Tobacco Cessation Medications?  Yes, implement Nicotene Replacement Protocol Counseled patient on smoking cessation:  Refused/Declined practical counseling Reason Tobacco Screening Not Completed: Patient Refused Screening       Social History:  Social History   Substance and Sexual Activity  Alcohol Use Yes   Alcohol/week: 0.0 standard drinks of alcohol   Comment: "Special occasions"     Social History   Substance and Sexual Activity  Drug Use Yes    Frequency: 4.0 times per week   Types: Marijuana   Comment: "2 blunts"    Additional Social History:                           Allergies:   Allergies  Allergen Reactions   Shellfish Allergy    Lab Results:  Results for orders placed or performed during the hospital encounter of 05/09/23 (from the past 48 hour(s))  Comprehensive metabolic panel     Status: Abnormal   Collection Time: 05/09/23  5:42 PM  Result Value Ref Range   Sodium 137 135 - 145 mmol/L   Potassium 4.1 3.5 - 5.1 mmol/L   Chloride 104 98 - 111 mmol/L   CO2 24 22 - 32 mmol/L   Glucose, Bld 93 70 - 99 mg/dL    Comment: Glucose reference range applies only to samples taken after fasting for at least 8 hours.   BUN 22 (H) 6 - 20 mg/dL   Creatinine, Ser 2.53 0.61 - 1.24 mg/dL   Calcium 8.5 (L) 8.9 - 10.3 mg/dL  Total Protein 6.6 6.5 - 8.1 g/dL   Albumin 3.6 3.5 - 5.0 g/dL   AST 34 15 - 41 U/L   ALT 32 0 - 44 U/L   Alkaline Phosphatase 66 38 - 126 U/L   Total Bilirubin 0.6 <1.2 mg/dL   GFR, Estimated >63 >87 mL/min    Comment: (NOTE) Calculated using the CKD-EPI Creatinine Equation (2021)    Anion gap 9 5 - 15    Comment: Performed at [facility], [address] [place], [place] [address]  Ethanol     Status: None   Collection Time: 05/09/23  5:42 PM  Result Value Ref Range   Alcohol, Ethyl (B) <10 <10 mg/dL    Comment: (NOTE) Lowest detectable limit for serum alcohol is 10 mg/dL.  For medical purposes only. Performed at [facility], [address] [place], [place] [address]   Salicylate level     Status: Abnormal   Collection Time: 05/09/23  5:42 PM  Result Value Ref Range   Salicylate Lvl <7.0 (L) 7.0 - 30.0 mg/dL    Comment: Performed at [facility], [address] [place], [place] [address]  Acetaminophen level     Status: Abnormal   Collection Time: 05/09/23  5:42 PM  Result Value Ref Range   Acetaminophen (Tylenol), Serum <10 (L) 10 - 30 ug/mL     Comment: (NOTE) Therapeutic concentrations vary significantly. A range of 10-30 ug/mL  may be an effective concentration for many patients. However, some  are best treated at concentrations outside of this range. Acetaminophen concentrations >150 ug/mL at 4 hours after ingestion  and >50 ug/mL at 12 hours after ingestion are often associated with  toxic reactions.  Performed at [facility], [address] [place], [place] [address]   cbc     Status: Abnormal   Collection Time: 05/09/23  5:42 PM  Result Value Ref Range   WBC 15.9 (H) 4.0 - 10.5 K/uL   RBC 4.08 (L) 4.22 - 5.81 MIL/uL   Hemoglobin 12.9 (L) 13.0 - 17.0 g/dL   HCT 60.1 (L) 09.3 - 23.5 %   MCV 94.9 80.0 - 100.0 fL   MCH 31.6 26.0 - 34.0 pg   MCHC 33.3 30.0 - 36.0 g/dL   RDW 57.3 22.0 - 25.4 %   Platelets 229 150 - 400 K/uL   nRBC 0.0 0.0 - 0.2 %    Comment: Performed at [facility], [address] [facility] [address] [place], [place] [address]  Urine Drug Screen, Qualitative     Status: Abnormal   Collection Time: 05/09/23  5:42 PM  Result Value Ref Range   Tricyclic, Ur Screen NONE DETECTED NONE DETECTED   Amphetamines, Ur Screen NONE DETECTED NONE DETECTED   MDMA (Ecstasy)Ur Screen NONE DETECTED NONE DETECTED   Cocaine Metabolite,Ur [place] NONE DETECTED NONE DETECTED   Opiate, Ur Screen NONE DETECTED NONE DETECTED   Phencyclidine (PCP) Ur S NONE DETECTED NONE DETECTED   Cannabinoid 50 Ng, Ur [place] POSITIVE (A) NONE DETECTED   Barbiturates, Ur Screen NONE DETECTED NONE DETECTED   Benzodiazepine, Ur Scrn NONE DETECTED NONE DETECTED   Methadone Scn, Ur NONE DETECTED NONE DETECTED    Comment: (NOTE) Tricyclics + metabolites, urine    Cutoff 1000 ng/mL Amphetamines + metabolites, urine  Cutoff 1000 ng/mL MDMA (Ecstasy), urine              Cutoff 500 ng/mL Cocaine Metabolite, urine  Cutoff 300 ng/mL Opiate + metabolites, urine        Cutoff 300 ng/mL Phencyclidine (PCP), urine         Cutoff 25  ng/mL Cannabinoid, urine                 Cutoff 50 ng/mL Barbiturates + metabolites, urine  Cutoff 200 ng/mL Benzodiazepine, urine              Cutoff 200 ng/mL Methadone, urine                   Cutoff 300 ng/mL  The urine drug screen provides only a preliminary, unconfirmed analytical test result and should not be used for non-medical purposes. Clinical consideration and professional judgment should be applied to any positive drug screen result due to possible interfering substances. A more specific alternate chemical method must be used in order to obtain a confirmed analytical result. Gas chromatography / mass spectrometry (GC/MS) is the preferred confirm atory method. Performed at [facility], [address] [place], [place] [address]     Blood Alcohol level:  Lab Results  Component Value Date   [facility] <10 05/09/2023   ETH <10 04/27/2023    Metabolic Disorder Labs:  Lab Results  Component Value Date   HGBA1C 5.1 04/30/2022   MPG 99.67 04/30/2022   MPG 96.8 05/06/2018   Lab Results  Component Value Date   PROLACTIN 6.1 12/19/2014   PROLACTIN 1.3 (L) 11/30/2014   Lab Results  Component Value Date   CHOL 172 04/30/2022   TRIG 39 04/30/2022   HDL 37 (L) 04/30/2022   CHOLHDL 4.6 04/30/2022   VLDL 8 04/30/2022   LDLCALC 127 (H) 04/30/2022   LDLCALC 108 (H) 05/06/2018    Current Medications: Current Facility-Administered Medications  Medication Dose Route Frequency Provider Last Rate Last Admin   acetaminophen (TYLENOL) tablet 650 mg  650 mg Oral Q6H PRN Jearld Lesch, NP       alum & mag hydroxide-simeth (MAALOX/MYLANTA) 200-200-20 MG/5ML suspension 30 mL  30 mL Oral Q4H PRN Durwin Nora, Rashaun M, NP       benztropine (COGENTIN) tablet 1 mg  1 mg Oral BID Durwin Nora, Rashaun M, NP   1 mg at 05/10/23 0848   buPROPion (WELLBUTRIN XL) 24 hr tablet 150 mg  150 mg Oral Daily Lerry Liner M, NP   150 mg at 05/10/23 0848   clonazePAM (KLONOPIN) tablet 0.5 mg  0.5  mg Oral BID Lerry Liner M, NP   0.5 mg at 05/10/23 0848   haloperidol lactate (HALDOL) injection 5 mg  5 mg Intramuscular TID PRN Jearld Lesch, NP       And   diphenhydrAMINE (BENADRYL) injection 50 mg  50 mg Intramuscular TID PRN Jearld Lesch, NP       And   LORazepam (ATIVAN) injection 2 mg  2 mg Intramuscular TID PRN Jearld Lesch, NP       hydrOXYzine (ATARAX) tablet 25 mg  25 mg Oral TID PRN Jearld Lesch, NP       magnesium hydroxide (MILK OF MAGNESIA) suspension 30 mL  30 mL Oral Daily PRN Dixon, Rashaun M, NP       melatonin tablet 5 mg  5 mg Oral QHS Dixon, Rashaun M, NP       montelukast (SINGULAIR) tablet 10 mg  10 mg Oral QHS Dixon, Rashaun M, NP       nicotine (NICODERM CQ - dosed in  mg/24 hours) patch 14 mg  14 mg Transdermal Daily Sarina Ill, DO       propranolol (INDERAL) tablet 20 mg  20 mg Oral BID Durwin Nora, Rashaun M, NP   20 mg at 05/10/23 0848   traZODone (DESYREL) tablet 50 mg  50 mg Oral QHS PRN Jearld Lesch, NP       Melene Muller ON 05/18/2023] Vitamin D (Ergocalciferol) (DRISDOL) 1.25 MG (50000 UNIT) capsule 50,000 Units  50,000 Units Oral Q7 days Jearld Lesch, NP       PTA Medications: Medications Prior to Admission  Medication Sig Dispense Refill Last Dose   albuterol (VENTOLIN HFA) 108 (90 Base) MCG/ACT inhaler Inhale 1-2 puffs into the lungs every 6 (six) hours as needed for shortness of breath. 18 g 1    benztropine (COGENTIN) 0.5 MG tablet Take 2 tablets (1 mg total) by mouth 2 (two) times daily. 60 tablet 1    buPROPion (WELLBUTRIN XL) 150 MG 24 hr tablet Take 1 tablet (150 mg total) by mouth daily. 30 tablet 1    cetirizine (ZYRTEC) 10 MG tablet Take 1 tablet (10 mg total) by mouth daily. 30 tablet 1    clonazePAM (KLONOPIN) 0.5 MG tablet Take 1 tablet (0.5 mg total) by mouth 2 (two) times daily. 60 tablet 1    cloZAPine (CLOZARIL) 100 MG tablet Take 300 mg by mouth at bedtime. Take along with one 50 mg tablet for total 350 mg at  bedtime      cloZAPine (CLOZARIL) 50 MG tablet Take 7 tablets (350 mg total) by mouth at bedtime. (Patient not taking: Reported on 04/27/2023) 210 tablet 0    docusate sodium (COLACE) 100 MG capsule Take 1 capsule (100 mg total) by mouth 2 (two) times daily. 60 capsule 1    fluticasone (FLONASE) 50 MCG/ACT nasal spray Place 2 sprays into both nostrils daily as needed for allergies or rhinitis. 16 g 1    hydrOXYzine (ATARAX) 25 MG tablet Take 1 tablet (25 mg total) by mouth 3 (three) times daily as needed for anxiety. 30 tablet 1    melatonin 5 MG TABS Take 1 tablet (5 mg total) by mouth at bedtime. (Patient taking differently: Take 3 mg by mouth at bedtime.) 30 tablet 1    montelukast (SINGULAIR) 10 MG tablet Take 1 tablet (10 mg total) by mouth at bedtime. 30 tablet 1    nicotine (NICODERM CQ - DOSED IN MG/24 HOURS) 21 mg/24hr patch Place 1 patch (21 mg total) onto the skin daily. 28 patch 1    Paliperidone Palmitate (INVEGA TRINZA) 819 MG/2.625ML SUSP Inject 819 mg into the muscle.      propranolol (INDERAL) 20 MG tablet Take 1 tablet (20 mg total) by mouth 2 (two) times daily. 60 tablet 1    Vitamin D, Ergocalciferol, (DRISDOL) 1.25 MG (50000 UNIT) CAPS capsule Take 1 capsule (50,000 Units total) by mouth every 7 (seven) days. (Patient not taking: Reported on 04/27/2023) 5 capsule 1     Musculoskeletal: Strength & Muscle Tone: within normal limits Gait & Station: normal Patient leans: N/A            Psychiatric Specialty Exam:  Presentation  General Appearance:  Bizarre  Eye Contact: Good  Speech: Clear and Coherent  Speech Volume: Normal  Handedness: Right   Mood and Affect  Mood: Euthymic  Affect: Full Range   Thought Process  Thought Processes: Disorganized  Duration of Psychotic Symptoms:N/A Past Diagnosis of Schizophrenia or Psychoactive disorder:  Yes  Descriptions of Associations:Loose  Orientation:Full (Time, Place and Person)  Thought  Content:Illogical; Tangential; Scattered  Hallucinations:Hallucinations: Auditory Description of Auditory Hallucinations: voices telling him "various things"  Ideas of Reference:Paranoia; Percusatory  Suicidal Thoughts:Suicidal Thoughts: Yes, Active SI Active Intent and/or Plan: With Means to Carry Out  Homicidal Thoughts:Homicidal Thoughts: No   Sensorium  Memory: Immediate Poor; Remote Poor  Judgment: Impaired  Insight: Shallow   Executive Functions  Concentration: Poor  Attention Span: Poor  Recall: Poor  Fund of Knowledge: Poor  Language: Poor   Psychomotor Activity  Psychomotor Activity: Psychomotor Activity: Normal   Assets  Assets: Communication Skills; Desire for Improvement; Financial Resources/Insurance; Resilience   Sleep  Sleep: Sleep: Poor    Physical Exam: Physical Exam Vitals and nursing note reviewed.  Constitutional:      Appearance: Normal appearance. He is normal weight.  HENT:     Head: Normocephalic and atraumatic.     Nose: Nose normal.     Mouth/Throat:     Pharynx: Oropharynx is clear.  Eyes:     Extraocular Movements: Extraocular movements intact.     Pupils: Pupils are equal, round, and reactive to light.  Cardiovascular:     Rate and Rhythm: Normal rate and regular rhythm.     Pulses: Normal pulses.     Heart sounds: Normal heart sounds.  Pulmonary:     Effort: Pulmonary effort is normal.     Breath sounds: Normal breath sounds.  Abdominal:     General: Abdomen is flat. Bowel sounds are normal.     Palpations: Abdomen is soft.  Musculoskeletal:        General: Normal range of motion.     Cervical back: Normal range of motion and neck supple.  Skin:    General: Skin is warm and dry.  Neurological:     General: No focal deficit present.     Mental Status: He is alert and oriented to person, place, and time.  Psychiatric:        Attention and Perception: Attention normal. He perceives auditory  hallucinations.        Mood and Affect: Mood is depressed.        Speech: Speech normal.        Behavior: Behavior normal. Behavior is cooperative.        Thought Content: Thought content is paranoid.        Cognition and Memory: Cognition and memory normal.        Judgment: Judgment normal.    Review of Systems  Constitutional: Negative.   HENT: Negative.    Eyes: Negative.   Respiratory: Negative.    Cardiovascular: Negative.   Gastrointestinal: Negative.   Genitourinary: Negative.   Musculoskeletal: Negative.   Skin: Negative.   Neurological: Negative.   Endo/Heme/Allergies: Negative.   Psychiatric/Behavioral:  Positive for depression and suicidal ideas.    Blood pressure 128/81, pulse 99, temperature 98.7 F (37.1 C), temperature source Oral, resp. rate 18, height 6' (1.829 m), weight 82.1 kg, SpO2 98%. Body mass index is 24.55 kg/m.  Treatment Plan Summary: Daily contact with patient to assess and evaluate symptoms and progress in treatment, Medication management, and Plan see orders  Observation Level/Precautions:  15 minute checks  Laboratory:  CBC Chemistry Profile  Psychotherapy:    Medications:    Consultations:    Discharge Concerns:    Estimated LOS:  Other:     Physician Treatment Plan for Primary Diagnosis: Schizophrenia (HCC) Long Term Goal(s): Improvement  in symptoms so as ready for discharge  Short Term Goals: Ability to identify changes in lifestyle to reduce recurrence of condition will improve, Ability to verbalize feelings will improve, Ability to disclose and discuss suicidal ideas, Ability to demonstrate self-control will improve, Ability to identify and develop effective coping behaviors will improve, Ability to maintain clinical measurements within normal limits will improve, Compliance with prescribed medications will improve, and Ability to identify triggers associated with substance abuse/mental health issues will improve  Physician Treatment  Plan for Secondary Diagnosis: Principal Problem:   Schizophrenia (HCC)   I certify that inpatient services furnished can reasonably be expected to improve the patient's condition.    Sarina Ill, DO 11/24/202411:40 AM

## 2023-05-11 DIAGNOSIS — F200 Paranoid schizophrenia: Secondary | ICD-10-CM | POA: Diagnosis not present

## 2023-05-11 LAB — HEMOGLOBIN A1C
Hgb A1c MFr Bld: 5.5 % (ref 4.8–5.6)
Mean Plasma Glucose: 111.15 mg/dL

## 2023-05-11 LAB — LIPID PANEL
Cholesterol: 132 mg/dL (ref 0–200)
HDL: 54 mg/dL (ref >40)
LDL Cholesterol: 71 mg/dL (ref 0–99)
Total CHOL/HDL Ratio: 2.4 {ratio}
Triglycerides: 33 mg/dL (ref <150)
VLDL: 7 mg/dL (ref 0–40)

## 2023-05-11 NOTE — Progress Notes (Signed)
[facility] MD Progress Note  05/11/2023 11:20 AM Anthony Luna  MRN:  [id number] Subjective: Anthony Luna is seen on rounds.  He has been started back on medication.  He just got at [facility] about a week and a half ago and was given [place] 2 shots.  He goes to [employer].  He has no place to live at the moment.  He has been pleasant and cooperative.  Has been compliant with medications and denies any side effects.  Nurses report no issues. Principal Problem: Schizophrenia (HCC) Diagnosis: Principal Problem:   Schizophrenia (HCC)  Total Time spent with patient: 15 minutes  Past Psychiatric History: History of schizophrenia Past Medical History:  Past Medical History:  Diagnosis Date   Depression    Since moving into new apartment 1 year ago   None to low serum cortisol response with adrenocorticotrophic hormone (ACTH) stimulation test    Schizophrenia, paranoid type (HCC)    History reviewed. No pertinent surgical history. Family History: History reviewed. No pertinent family history. Family Psychiatric  History: Unremarkable Social History:  Social History   Substance and Sexual Activity  Alcohol Use Yes   Alcohol/week: 0.0 standard drinks of alcohol   Comment: "Special occasions"     Social History   Substance and Sexual Activity  Drug Use Yes   Frequency: 4.0 times per week   Types: Marijuana   Comment: "2 blunts"    Social History   Socioeconomic History   Marital status: Single    Spouse name: Not on file   Number of children: Not on file   Years of education: Not on file   Highest education level: Not on file  Occupational History   Not on file  Tobacco Use   Smoking status: Every Day    Current packs/day: 0.50    Average packs/day: 0.5 packs/day for 20.4 years (10.2 ttl pk-yrs)    Types: Cigarettes    Start date: 11/29/2002   Smokeless tobacco: Former   Tobacco comments:    does not want to quit  Vaping Use   Vaping status: Never  Used  Substance and Sexual Activity   Alcohol use: Yes    Alcohol/week: 0.0 standard drinks of alcohol    Comment: "Special occasions"   Drug use: Yes    Frequency: 4.0 times per week    Types: Marijuana    Comment: "2 blunts"   Sexual activity: Not on file  Other Topics Concern   Not on file  Social History Narrative   Not on file   Social Determinants of Health   Financial Resource Strain: Not on file  Food Insecurity: Food Insecurity Present (05/10/2023)   Hunger Vital Sign    Worried About Running Out of Food in the Last Year: Sometimes true    Ran Out of Food in the Last Year: Often true  Transportation Needs: Unmet Transportation Needs (05/10/2023)   PRAPARE - Administrator, Civil Service (Medical): Yes    Lack of Transportation (Non-Medical): Yes  Physical Activity: Not on file  Stress: Not on file  Social Connections: Not on file   Additional Social History:                         Sleep: Good  Appetite:  Good  Current Medications: Current Facility-Administered Medications  Medication Dose Route Frequency Provider Last Rate Last Admin   acetaminophen (TYLENOL) tablet 650 mg  650 mg Oral Q6H PRN  Jearld Lesch, NP       alum & mag hydroxide-simeth (MAALOX/MYLANTA) 200-200-20 MG/5ML suspension 30 mL  30 mL Oral Q4H PRN Durwin Nora, Rashaun M, NP       benztropine (COGENTIN) tablet 1 mg  1 mg Oral BID Durwin Nora, Rashaun M, NP   1 mg at 05/11/23 0830   buPROPion (WELLBUTRIN XL) 24 hr tablet 150 mg  150 mg Oral Daily Durwin Nora, Rashaun M, NP   150 mg at 05/11/23 0830   clonazePAM (KLONOPIN) tablet 0.5 mg  0.5 mg Oral BID Lerry Liner M, NP   0.5 mg at 05/11/23 2355   haloperidol lactate (HALDOL) injection 5 mg  5 mg Intramuscular TID PRN Jearld Lesch, NP       And   diphenhydrAMINE (BENADRYL) injection 50 mg  50 mg Intramuscular TID PRN Jearld Lesch, NP       And   LORazepam (ATIVAN) injection 2 mg  2 mg Intramuscular TID PRN Jearld Lesch,  NP       hydrOXYzine (ATARAX) tablet 25 mg  25 mg Oral TID PRN Jearld Lesch, NP       magnesium hydroxide (MILK OF MAGNESIA) suspension 30 mL  30 mL Oral Daily PRN Jearld Lesch, NP       melatonin tablet 5 mg  5 mg Oral QHS Dixon, Rashaun M, NP   5 mg at 05/10/23 2125   montelukast (SINGULAIR) tablet 10 mg  10 mg Oral QHS Jearld Lesch, NP   10 mg at 05/10/23 2125   nicotine (NICODERM CQ - dosed in mg/24 hours) patch 14 mg  14 mg Transdermal Daily Sarina Ill, DO   14 mg at 05/11/23 0830   OLANZapine (ZYPREXA) tablet 10 mg  10 mg Oral Q6H PRN Sarina Ill, DO       propranolol (INDERAL) tablet 20 mg  20 mg Oral BID Lerry Liner M, NP   20 mg at 05/11/23 7322   risperiDONE (RISPERDAL) tablet 1 mg  1 mg Oral [id number] Sarina Ill, DO   1 mg at 05/11/23 0830   traZODone (DESYREL) tablet 50 mg  50 mg Oral QHS PRN Jearld Lesch, NP       Melene Muller ON 05/18/2023] Vitamin D (Ergocalciferol) (DRISDOL) 1.25 MG (50000 UNIT) capsule 50,000 Units  50,000 Units Oral Q7 days Jearld Lesch, NP        Lab Results:  Results for orders placed or performed during the hospital encounter of 05/10/23 (from the past 48 hour(s))  Lipid panel     Status: None   Collection Time: 05/11/23  7:02 AM  Result Value Ref Range   Cholesterol 132 0 - 200 mg/dL   Triglycerides 33 <025 mg/dL   HDL 54 >42 mg/dL   Total CHOL/HDL Ratio 2.4 RATIO   VLDL 7 0 - 40 mg/dL   LDL Cholesterol 71 0 - 99 mg/dL    Comment:        Total Cholesterol/HDL:CHD Risk Coronary Heart Disease Risk Table                     Men   Women  1/2 Average Risk   3.4   3.3  Average Risk       5.0   4.4  2 X Average Risk   9.6   7.1  3 X Average Risk  23.4   11.0        Use the calculated  Patient Ratio above and the CHD Risk Table to determine the patient's CHD Risk.        ATP III CLASSIFICATION (LDL):  <100     mg/dL   Optimal  295-621  mg/dL   Near or Above                    Optimal  130-159   mg/dL   Borderline  308-657  mg/dL   High  >846     mg/dL   Very High Performed at [facility], [address] [place], [place] [address]     Blood Alcohol level:  Lab Results  Component Value Date   [facility] <10 05/09/2023   ETH <10 04/27/2023    Metabolic Disorder Labs: Lab Results  Component Value Date   HGBA1C 5.1 04/30/2022   MPG 99.67 04/30/2022   MPG 96.8 05/06/2018   Lab Results  Component Value Date   PROLACTIN 6.1 12/19/2014   PROLACTIN 1.3 (L) 11/30/2014   Lab Results  Component Value Date   CHOL 132 05/11/2023   TRIG 33 05/11/2023   HDL 54 05/11/2023   CHOLHDL 2.4 05/11/2023   VLDL 7 05/11/2023   LDLCALC 71 05/11/2023   LDLCALC 127 (H) 04/30/2022    Physical Findings: AIMS:  , ,  ,  ,    CIWA:    COWS:     Musculoskeletal: Strength & Muscle Tone: within normal limits Gait & Station: normal Patient leans: N/A  Psychiatric Specialty Exam:  Presentation  General Appearance:  Bizarre  Eye Contact: Good  Speech: Clear and Coherent  Speech Volume: Normal  Handedness: Right   Mood and Affect  Mood: Euthymic  Affect: Full Range   Thought Process  Thought Processes: Disorganized  Descriptions of Associations:Loose  Orientation:Full (Time, Place and Person)  Thought Content:Illogical; Tangential; Scattered  History of Schizophrenia/Schizoaffective disorder:Yes  Duration of Psychotic Symptoms:Greater than six months  Hallucinations:No data recorded Ideas of Reference:Paranoia; Percusatory  Suicidal Thoughts:No data recorded Homicidal Thoughts:No data recorded  Sensorium  Memory: Immediate Poor; Remote Poor  Judgment: Impaired  Insight: Shallow   Executive Functions  Concentration: Poor  Attention Span: Poor  Recall: Poor  Fund of Knowledge: Poor  Language: Poor   Psychomotor Activity  Psychomotor Activity:No data recorded  Assets  Assets: Communication Skills; Desire for Improvement;  Financial Resources/Insurance; Resilience   Sleep  Sleep:No data recorded    Blood pressure 108/79, pulse 94, temperature 98.7 F (37.1 C), resp. rate 19, height 6' (1.829 m), weight 82.1 kg, SpO2 98%. Body mass index is 24.55 kg/m.   Treatment Plan Summary: Daily contact with patient to assess and evaluate symptoms and progress in treatment, Medication management, and Plan continue current medications.  Sarina Ill, DO 05/11/2023, 11:20 AM

## 2023-05-11 NOTE — Group Note (Signed)
Recreation Therapy Group Note   Group Topic:Coping Skills  Group Date: 05/11/2023 Start Time: 1000 End Time: 1100 Facilitators: Rosina Lowenstein, LRT, CTRS Location:  Craft Room  Group Description: Mind Map.  Patient was provided a blank template of a diagram with 32 blank boxes in a tiered system, branching from the center (similar to a bubble chart). LRT directed patients to label the middle of the diagram "Coping Skills". LRT and patients then came up with 8 different coping skills as examples. Pt were directed to record their coping skills in the 2nd tier boxes closest to the center.  Patients would then share their coping skills with the group as LRT wrote them out. LRT gave a handout of 99 different coping skills at the end of group.   Goal Area(s) Addressed: Patients will be able to define "coping skills". Patient will identify new coping skills.  Patient will increase communication.   Affect/Mood: Full range   Participation Level: Moderate   Participation Quality: Moderate Cues   Behavior: Hypersexual    Speech/Thought Process: Loose association   Insight: Limited   Judgement: Limited   Modes of Intervention: Education and Worksheet   Patient Response to Interventions:  Receptive   Education Outcome:  In group clarification offered    Clinical Observations/Individualized Feedback: Anthony Luna was somewhat active in their participation of session activities and group discussion. Pt identified "masturbation is a coping skills because the doctors told me it releases endorphins and that's good". Pt continued talking sexually, however was redirected by LRT. Pt provided insight in saying that his work can be stressful sometimes and he gets overwhelmed so work isn't always a healthy coping skill for him.   Plan: Continue to engage patient in RT group sessions 2-3x/week.   Rosina Lowenstein, LRT, CTRS 05/11/2023 11:29 AM

## 2023-05-11 NOTE — Group Note (Signed)
Date:  05/11/2023 Time:  10:04 PM  Group Topic/Focus:  Wrap-Up Group:   The focus of this group is to help patients review their daily goal of treatment and discuss progress on daily workbooks.    Participation Level:  Active  Participation Quality:  Appropriate and Monopolizing  Affect:  Excited and Not Congruent  Cognitive:  Alert and Disorganized  Insight: Good and Limited  Engagement in Group:  Engaged and Off Topic  Modes of Intervention:  Discussion  Additional Comments:     Maglione,Arland Usery E 05/11/2023, 10:04 PM
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
## 2023-05-11 NOTE — Plan of Care (Signed)
Pt compliant with procedures on the unit, still presents with bizarre affect   Problem: Education: Goal: Knowledge of [facility] Education information/materials will improve Outcome: Not Progressing Goal: Emotional status will improve Outcome: Not Progressing Goal: Mental status will improve Outcome: Not Progressing Goal: Verbalization of understanding the information provided will improve Outcome: Not Progressing   Problem: Activity: Goal: Interest or engagement in activities will improve Outcome: Not Progressing Goal: Sleeping patterns will improve Outcome: Not Progressing   Problem: Coping: Goal: Ability to verbalize frustrations and anger appropriately will improve Outcome: Not Progressing Goal: Ability to demonstrate self-control will improve Outcome: Not Progressing   Problem: Health Behavior/Discharge Planning: Goal: Identification of resources available to assist in meeting health care needs will improve Outcome: Not Progressing Goal: Compliance with treatment plan for underlying cause of condition will improve Outcome: Not Progressing   Problem: Physical Regulation: Goal: Ability to maintain clinical measurements within normal limits will improve Outcome: Not Progressing   Problem: Safety: Goal: Periods of time without injury will increase Outcome: Not Progressing   Problem: Activity: Goal: Will verbalize the importance of balancing activity with adequate rest periods Outcome: Not Progressing   Problem: Coping: Goal: Coping ability will improve Outcome: Not Progressing Goal: Will verbalize feelings Outcome: Not Progressing   Problem: Role Relationship: Goal: Ability to communicate needs accurately will improve Outcome: Not Progressing   Problem: Education: Goal: Knowledge of disease or condition will improve Outcome: Not Progressing Goal: Understanding of discharge needs will improve Outcome: Not Progressing   Problem: Physical  Regulation: Goal: Complications related to the disease process, condition or treatment will be avoided or minimized Outcome: Not Progressing   Problem: Activity: Goal: Will verbalize the importance of balancing activity with adequate rest periods Outcome: Not Progressing   Problem: Education: Goal: Will be free of psychotic symptoms Outcome: Not Progressing Goal: Knowledge of the prescribed therapeutic regimen will improve Outcome: Not Progressing   Problem: Coping: Goal: Coping ability will improve Outcome: Not Progressing Goal: Will verbalize feelings Outcome: Not Progressing   Problem: Health Behavior/Discharge Planning: Goal: Compliance with prescribed medication regimen will improve Outcome: Not Progressing   Problem: Nutritional: Goal: Ability to achieve adequate nutritional intake will improve Outcome: Not Progressing   Problem: Role Relationship: Goal: Ability to communicate needs accurately will improve Outcome: Not Progressing Goal: Ability to interact with others will improve Outcome: Not Progressing   Problem: Safety: Goal: Ability to redirect hostility and anger into socially appropriate behaviors will improve Outcome: Not Progressing Goal: Ability to remain free from injury will improve Outcome: Not Progressing   Problem: Self-Care: Goal: Ability to participate in self-care as condition permits will improve Outcome: Not Progressing   Problem: Self-Concept: Goal: Will verbalize positive feelings about self Outcome: Not Progressing   Problem: Education: Goal: Ability to make informed decisions regarding treatment will improve Outcome: Not Progressing   Problem: Coping: Goal: Coping ability will improve Outcome: Not Progressing   Problem: Health Behavior/Discharge Planning: Goal: Identification of resources available to assist in meeting health care needs will improve Outcome: Not Progressing   Problem: Medication: Goal: Compliance with  prescribed medication regimen will improve Outcome: Not Progressing   Problem: Self-Concept: Goal: Ability to disclose and discuss suicidal ideas will improve Outcome: Not Progressing Goal: Will verbalize positive feelings about self Outcome: Not Progressing Note: Patient is on track. Patient will maintain adherence

## 2023-05-11 NOTE — Group Note (Signed)
Date:  05/11/2023 Time:  5:45 PM  Group Topic/Focus:  Dimensions of Wellness:   The focus of this group is to introduce the topic of wellness and discuss the role each dimension of wellness plays in total health. Goals Group:   The focus of this group is to help patients establish daily goals to achieve during treatment and discuss how the patient can incorporate goal setting into their daily lives to aide in recovery.    Participation Level:  Active  Participation Quality:  Appropriate  Affect:  Appropriate  Cognitive:  Alert and Appropriate  Insight: Appropriate  Engagement in Group:  Developing/Improving  Modes of Intervention:  Activity, Discussion, and Education  Additional Comments:    Rosaura Carpenter 05/11/2023, 5:45 PM

## 2023-05-11 NOTE — Group Note (Signed)
LCSW Group Therapy Note  Group Date: 05/11/2023 Start Time: 1330 End Time: 1430   Type of Therapy and Topic:  Group Therapy - Healthy vs Unhealthy Coping Skills  Participation Level:  Minimal   Description of Group The focus of this group was to determine what unhealthy coping techniques typically are used by group members and what healthy coping techniques would be helpful in coping with various problems. Patients were guided in becoming aware of the differences between healthy and unhealthy coping techniques. Patients were asked to identify 2-3 healthy coping skills they would like to learn to use more effectively.  Therapeutic Goals Patients learned that coping is what human beings do all day long to deal with various situations in their lives Patients defined and discussed healthy vs unhealthy coping techniques Patients identified their preferred coping techniques and identified whether these were healthy or unhealthy Patients determined 2-3 healthy coping skills they would like to become more familiar with and use more often. Patients provided support and ideas to each other   Summary of Patient Progress:   During group, patient was attention seeking and intrusive.  CSW had to redirect the patient several times to deescalate and remain on group topic.  Patient proved open to input from peers and feedback from CSW. Patient demonstrated poor insight into the subject matter, was respectful of peers, and participated throughout the entire session.   Therapeutic Modalities Cognitive Behavioral Therapy Motivational Interviewing  Claudie Fisherman 05/11/2023  2:56 PM

## 2023-05-11 NOTE — Progress Notes (Signed)
D- Patient alert and oriented x 3 -4. Mildly confused at times with disorganized speech. Affect bright/mood congruent. Denies SI/ HI/ AVH. Patient denies pain. Patient denies depression and anxiety. He states his goals are to "get it together and find a place to live". A- Scheduled medications administered to patient, per MD orders. Support and encouragement provided.  Routine safety checks conducted every 15 minutes without incident.  Patient informed to notify staff with problems or concerns and verbalizes understanding. R- No adverse drug reactions noted. Patient compliant with medications and treatment plan. Patient receptive and cooperative.He interacts well with others on the unit.  Patient contracts for safety and  remains safe on the unit at this time.

## 2023-05-11 NOTE — Progress Notes (Signed)
Pt presents with a bizarre affect. Pt observed interacting appropriately with staff and peers on the unit. Pt compliant with medication administration per MD orders. Pt given education, support, and encouragement to be active in his treatment plan. Pt being monitored Q 15 minutes for safety per unit protocol, remains safe on the unit

## 2023-05-11 NOTE — BH IP Treatment Plan (Signed)
Interdisciplinary Treatment and Diagnostic Plan Update  05/11/2023 Time of Session: 9:23AM Anthony Luna MRN: [id number]  Principal Diagnosis: Schizophrenia [facility])  Secondary Diagnoses: Principal Problem:   Schizophrenia (HCC)   Current Medications:  Current Facility-Administered Medications  Medication Dose Route Frequency Provider Last Rate Last Admin   acetaminophen (TYLENOL) tablet 650 mg  650 mg Oral Q6H PRN Jearld Lesch, NP       alum & mag hydroxide-simeth (MAALOX/MYLANTA) 200-200-20 MG/5ML suspension 30 mL  30 mL Oral Q4H PRN Durwin Nora, Rashaun M, NP       benztropine (COGENTIN) tablet 1 mg  1 mg Oral BID Durwin Nora, Rashaun M, NP   1 mg at 05/11/23 0830   buPROPion (WELLBUTRIN XL) 24 hr tablet 150 mg  150 mg Oral Daily Durwin Nora, Rashaun M, NP   150 mg at 05/11/23 0830   clonazePAM (KLONOPIN) tablet 0.5 mg  0.5 mg Oral BID Lerry Liner M, NP   0.5 mg at 05/11/23 8657   haloperidol lactate (HALDOL) injection 5 mg  5 mg Intramuscular TID PRN Jearld Lesch, NP       And   diphenhydrAMINE (BENADRYL) injection 50 mg  50 mg Intramuscular TID PRN Jearld Lesch, NP       And   LORazepam (ATIVAN) injection 2 mg  2 mg Intramuscular TID PRN Jearld Lesch, NP       hydrOXYzine (ATARAX) tablet 25 mg  25 mg Oral TID PRN Jearld Lesch, NP       magnesium hydroxide (MILK OF MAGNESIA) suspension 30 mL  30 mL Oral Daily PRN Jearld Lesch, NP       melatonin tablet 5 mg  5 mg Oral QHS Dixon, Rashaun M, NP   5 mg at 05/10/23 2125   montelukast (SINGULAIR) tablet 10 mg  10 mg Oral QHS Jearld Lesch, NP   10 mg at 05/10/23 2125   nicotine (NICODERM CQ - dosed in mg/24 hours) patch 14 mg  14 mg Transdermal Daily Sarina Ill, DO   14 mg at 05/11/23 0830   OLANZapine (ZYPREXA) tablet 10 mg  10 mg Oral Q6H PRN Sarina Ill, DO       propranolol (INDERAL) tablet 20 mg  20 mg Oral BID Durwin Nora, Rashaun M, NP   20 mg at 05/11/23 0829   risperiDONE (RISPERDAL) tablet  1 mg  1 mg Oral [id number] Sarina Ill, DO   1 mg at 05/11/23 0830   traZODone (DESYREL) tablet 50 mg  50 mg Oral QHS PRN Jearld Lesch, NP       Melene Muller ON 05/18/2023] Vitamin D (Ergocalciferol) (DRISDOL) 1.25 MG (50000 UNIT) capsule 50,000 Units  50,000 Units Oral Q7 days Jearld Lesch, NP       PTA Medications: Medications Prior to Admission  Medication Sig Dispense Refill Last Dose   albuterol (VENTOLIN HFA) 108 (90 Base) MCG/ACT inhaler Inhale 1-2 puffs into the lungs every 6 (six) hours as needed for shortness of breath. 18 g 1    benztropine (COGENTIN) 0.5 MG tablet Take 2 tablets (1 mg total) by mouth 2 (two) times daily. 60 tablet 1    buPROPion (WELLBUTRIN XL) 150 MG 24 hr tablet Take 1 tablet (150 mg total) by mouth daily. 30 tablet 1    cetirizine (ZYRTEC) 10 MG tablet Take 1 tablet (10 mg total) by mouth daily. 30 tablet 1    clonazePAM (KLONOPIN) 0.5 MG tablet Take 1 tablet (0.5 mg total)  by mouth 2 (two) times daily. 60 tablet 1    cloZAPine (CLOZARIL) 100 MG tablet Take 300 mg by mouth at bedtime. Take along with one 50 mg tablet for total 350 mg at bedtime      cloZAPine (CLOZARIL) 50 MG tablet Take 7 tablets (350 mg total) by mouth at bedtime. (Patient not taking: Reported on 04/27/2023) 210 tablet 0    docusate sodium (COLACE) 100 MG capsule Take 1 capsule (100 mg total) by mouth 2 (two) times daily. 60 capsule 1    fluticasone (FLONASE) 50 MCG/ACT nasal spray Place 2 sprays into both nostrils daily as needed for allergies or rhinitis. 16 g 1    hydrOXYzine (ATARAX) 25 MG tablet Take 1 tablet (25 mg total) by mouth 3 (three) times daily as needed for anxiety. 30 tablet 1    melatonin 5 MG TABS Take 1 tablet (5 mg total) by mouth at bedtime. (Patient taking differently: Take 3 mg by mouth at bedtime.) 30 tablet 1    montelukast (SINGULAIR) 10 MG tablet Take 1 tablet (10 mg total) by mouth at bedtime. 30 tablet 1    nicotine (NICODERM CQ - DOSED IN MG/24 HOURS) 21  mg/24hr patch Place 1 patch (21 mg total) onto the skin daily. 28 patch 1    Paliperidone Palmitate (INVEGA TRINZA) 819 MG/2.625ML SUSP Inject 819 mg into the muscle.      propranolol (INDERAL) 20 MG tablet Take 1 tablet (20 mg total) by mouth 2 (two) times daily. 60 tablet 1    Vitamin D, Ergocalciferol, (DRISDOL) 1.25 MG (50000 UNIT) CAPS capsule Take 1 capsule (50,000 Units total) by mouth every 7 (seven) days. (Patient not taking: Reported on 04/27/2023) 5 capsule 1     Patient Stressors: Financial difficulties   Substance abuse   Other: Housing     Patient Strengths: Motivation for treatment/growth   Treatment Modalities: Medication Management, Group therapy, Case management,  1 to 1 session with clinician, Psychoeducation, Recreational therapy.   Physician Treatment Plan for Primary Diagnosis: Schizophrenia (HCC) Long Term Goal(s): Improvement in symptoms so as ready for discharge   Short Term Goals: Ability to identify changes in lifestyle to reduce recurrence of condition will improve Ability to verbalize feelings will improve Ability to disclose and discuss suicidal ideas Ability to demonstrate self-control will improve Ability to identify and develop effective coping behaviors will improve Ability to maintain clinical measurements within normal limits will improve Compliance with prescribed medications will improve Ability to identify triggers associated with substance abuse/mental health issues will improve  Medication Management: Evaluate patient's response, side effects, and tolerance of medication regimen.  Therapeutic Interventions: 1 to 1 sessions, Unit Group sessions and Medication administration.  Evaluation of Outcomes: Not Met  Physician Treatment Plan for Secondary Diagnosis: Principal Problem:   Schizophrenia (HCC)  Long Term Goal(s): Improvement in symptoms so as ready for discharge   Short Term Goals: Ability to identify changes in lifestyle to reduce  recurrence of condition will improve Ability to verbalize feelings will improve Ability to disclose and discuss suicidal ideas Ability to demonstrate self-control will improve Ability to identify and develop effective coping behaviors will improve Ability to maintain clinical measurements within normal limits will improve Compliance with prescribed medications will improve Ability to identify triggers associated with substance abuse/mental health issues will improve     Medication Management: Evaluate patient's response, side effects, and tolerance of medication regimen.  Therapeutic Interventions: 1 to 1 sessions, Unit Group sessions and Medication administration.  Evaluation of Outcomes: Not Met   RN Treatment Plan for Primary Diagnosis: Schizophrenia (HCC) Long Term Goal(s): Knowledge of disease and therapeutic regimen to maintain health will improve  Short Term Goals: Ability to demonstrate self-control, Ability to participate in decision making will improve, Ability to verbalize feelings will improve, Ability to disclose and discuss suicidal ideas, Ability to identify and develop effective coping behaviors will improve, and Compliance with prescribed medications will improve  Medication Management: RN will administer medications as ordered by provider, will assess and evaluate patient's response and provide education to patient for prescribed medication. RN will report any adverse and/or side effects to prescribing provider.  Therapeutic Interventions: 1 on 1 counseling sessions, Psychoeducation, Medication administration, Evaluate responses to treatment, Monitor vital signs and CBGs as ordered, Perform/monitor CIWA, COWS, AIMS and Fall Risk screenings as ordered, Perform wound care treatments as ordered.  Evaluation of Outcomes: Not Met   LCSW Treatment Plan for Primary Diagnosis: Schizophrenia (HCC) Long Term Goal(s): Safe transition to appropriate next level of care at discharge,  Engage patient in therapeutic group addressing interpersonal concerns.  Short Term Goals: Engage patient in aftercare planning with referrals and resources, Increase social support, Increase ability to appropriately verbalize feelings, Increase emotional regulation, Facilitate acceptance of mental health diagnosis and concerns, and Increase skills for wellness and recovery  Therapeutic Interventions: Assess for all discharge needs, 1 to 1 time with Social worker, Explore available resources and support systems, Assess for adequacy in community support network, Educate family and significant other(s) on suicide prevention, Complete Psychosocial Assessment, Interpersonal group therapy.  Evaluation of Outcomes: Not Met   Progress in Treatment: Attending groups: Yes. Participating in groups: Yes. Taking medication as prescribed: Yes. Toleration medication: Yes. Family/Significant other contact made: No, will contact:  once permission has been granted. Patient understands diagnosis: Yes. Discussing patient identified problems/goals with staff: Yes. Medical problems stabilized or resolved: Yes. Denies suicidal/homicidal ideation: Yes. Issues/concerns per patient self-inventory: No. Other: none  New problem(s) identified: No, Describe:  none  New Short Term/Long Term Goal(s):  detox, elimination of symptoms of psychosis, medication management for mood stabilization; elimination of SI thoughts; development of comprehensive mental wellness/sobriety plan.   Patient Goals:  "never give up and be a better person"  Discharge Plan or Barriers: CSW to assist in the development of appropriate discharge plans.    Reason for Continuation of Hospitalization: Anxiety Depression Medical Issues Medication stabilization Suicidal ideation  Estimated Length of Stay:  1-7 days  Last 3 [place] Suicide Severity Risk Score: Flowsheet Row Admission (Current) from 05/10/2023 in [facility] INPATIENT BEHAV[facility]AL  MEDICINE ED from 05/09/2023 in [facility] Emergency Department at [facility] ED from 04/27/2023 in [facility] Emergency Department at [facility]  C-SSRS RISK CATEGORY High Risk High Risk High Risk       Last PHQ 2/9 Scores:     No data to display          Scribe for Treatment Team: Harden Mo, LCSW 05/11/2023 9:56 AM

## 2023-05-11 NOTE — Group Note (Signed)
Date:  05/11/2023 Time:  5:51 PM  Group Topic/Focus:  Healthy Communication:   The focus of this group is to discuss communication, barriers to communication, as well as healthy ways to communicate with others. Outdoor recreation structured group activity    Participation Level:  Active  Participation Quality:  Appropriate  Affect:  Appropriate  Cognitive:  Alert and Appropriate  Insight: Appropriate  Engagement in Group:  Developing/Improving and Engaged  Modes of Intervention:  Activity  Additional Comments:    Anthony Luna 05/11/2023, 5:51 PM

## 2023-05-11 NOTE — Plan of Care (Signed)
  Problem: Education: Goal: Knowledge of [place] [facility] Education information/materials will improve Outcome: Not Progressing Goal: Emotional status will improve Outcome: Not Progressing Goal: Mental status will improve Outcome: Not Progressing Goal: Verbalization of understanding the information provided will improve Outcome: Not Progressing   Problem: Activity: Goal: Interest or engagement in activities will improve Outcome: Not Progressing Goal: Sleeping patterns will improve Outcome: Not Progressing   Problem: Coping: Goal: Ability to verbalize frustrations and anger appropriately will improve Outcome: Not Progressing Goal: Ability to demonstrate self-control will improve Outcome: Not Progressing   Problem: Health Behavior/Discharge Planning: Goal: Identification of resources available to assist in meeting health care needs will improve Outcome: Not Progressing Goal: Compliance with treatment plan for underlying cause of condition will improve Outcome: Not Progressing   Problem: Physical Regulation: Goal: Ability to maintain clinical measurements within normal limits will improve Outcome: Not Progressing   Problem: Safety: Goal: Periods of time without injury will increase Outcome: Not Progressing   Problem: Activity: Goal: Will verbalize the importance of balancing activity with adequate rest periods Outcome: Not Progressing   Problem: Coping: Goal: Coping ability will improve Outcome: Not Progressing Goal: Will verbalize feelings Outcome: Not Progressing   Problem: Role Relationship: Goal: Ability to communicate needs accurately will improve Outcome: Not Progressing   Problem: Education: Goal: Knowledge of disease or condition will improve Outcome: Not Progressing Goal: Understanding of discharge needs will improve Outcome: Not Progressing   Problem: Physical Regulation: Goal: Complications related to the disease process, condition or treatment will  be avoided or minimized Outcome: Not Progressing   Problem: Activity: Goal: Will verbalize the importance of balancing activity with adequate rest periods Outcome: Not Progressing   Problem: Education: Goal: Will be free of psychotic symptoms Outcome: Not Progressing Goal: Knowledge of the prescribed therapeutic regimen will improve Outcome: Not Progressing   Problem: Coping: Goal: Coping ability will improve Outcome: Not Progressing Goal: Will verbalize feelings Outcome: Not Progressing   Problem: Health Behavior/Discharge Planning: Goal: Compliance with prescribed medication regimen will improve Outcome: Not Progressing   Problem: Nutritional: Goal: Ability to achieve adequate nutritional intake will improve Outcome: Not Progressing   Problem: Role Relationship: Goal: Ability to communicate needs accurately will improve Outcome: Not Progressing Goal: Ability to interact with others will improve Outcome: Not Progressing   Problem: Safety: Goal: Ability to redirect hostility and anger into socially appropriate behaviors will improve Outcome: Not Progressing Goal: Ability to remain free from injury will improve Outcome: Not Progressing   Problem: Self-Care: Goal: Ability to participate in self-care as condition permits will improve Outcome: Not Progressing   Problem: Self-Concept: Goal: Will verbalize positive feelings about self Outcome: Not Progressing   Problem: Education: Goal: Ability to make informed decisions regarding treatment will improve Outcome: Not Progressing   Problem: Coping: Goal: Coping ability will improve Outcome: Not Progressing   Problem: Health Behavior/Discharge Planning: Goal: Identification of resources available to assist in meeting health care needs will improve Outcome: Not Progressing   Problem: Medication: Goal: Compliance with prescribed medication regimen will improve Outcome: Not Progressing   Problem:  Self-Concept: Goal: Ability to disclose and discuss suicidal ideas will improve Outcome: Not Progressing Goal: Will verbalize positive feelings about self Outcome: Not Progressing Note:

## 2023-05-12 DIAGNOSIS — F203 Undifferentiated schizophrenia: Secondary | ICD-10-CM | POA: Diagnosis not present

## 2023-05-12 NOTE — Plan of Care (Signed)
Problem: Health Behavior/Discharge Planning: Goal: Compliance with treatment plan for underlying cause of condition will improve Outcome: Progressing   Problem: Safety: Goal: Periods of time without injury will increase Outcome: Progressing   Problem: Self-Concept: Goal: Ability to disclose and discuss suicidal ideas will improve Outcome: Progressing

## 2023-05-12 NOTE — Progress Notes (Signed)
Pt presents with a brighter affect than when this writer had him last night. Pt observed interacting appropriately with staff and peers on the unit. Pt compliant with medication administration per MD orders. Pt given education, support, and encouragement to be active in his treatment plan. Pt being monitored Q 15 minutes for safety per unit protocol, remains safe on the unit

## 2023-05-12 NOTE — Group Note (Signed)
Recreation Therapy Group Note   Group Topic:Goal Setting  Group Date: 05/12/2023 Start Time: 1000 End Time: 1100 Facilitators: Rosina Lowenstein, LRT, CTRS Location:  Craft Room  Group Description: Product/process development scientist. Patients were given many different magazines, a glue stick, markers, and a piece of cardstock paper. LRT and pts discussed the importance of having goals in life. LRT and pts discussed the difference between short-term and long-term goals, as well as what a SMART goal is. LRT encouraged pts to create a vision board, with images they picked and then cut out with safety scissors from the magazine, for themselves, that capture their short and long-term goals. LRT encouraged pts to show and explain their vision board to the group.   Goal Area(s) Addressed:  Patient will gain knowledge of short vs. long term goals.  Patient will identify goals for themselves. Patient will practice setting SMART goals. Patient will verbalize their goals to LRT and peers.   Affect/Mood: Labile   Participation Level: Minimal   Participation Quality: Moderate Cues   Behavior: Bizarre and Disinterested   Speech/Thought Process: Loose association   Insight: Poor   Judgement: Poor   Modes of Intervention: Art and Education   Patient Response to Interventions:  Disengaged   Education Outcome:  In group clarification offered    Clinical Observations/Individualized Feedback: Anthony Luna was not active in their participation of session activities and group discussion. Pt was in and out of group multiple times. Pt did not complete a vision board, however, grabbed supplies. Pt picked up the safety scissors and went in to put peers beard and began laughing. LRT redirected pt and pt said "oh I wasn't really gonna do it". Pt was then noted to be falling asleep while in group. Pt made random comments that did not involve the group topic.    Plan: Continue to engage patient in RT group sessions  2-3x/week.   Rosina Lowenstein, LRT, CTRS 05/12/2023 11:44 AM

## 2023-05-12 NOTE — [facility] Suicide Risk Assessment (Addendum)
[facility] INPATIENT:  Family/Significant Other Suicide Prevention Education  Suicide Prevention Education:  Contact Attempts: Adriana Reams, Group Home director, [phone], has been identified by the patient as the family member/significant other with whom the patient will be residing, and identified as the person(s) who will aid the patient in the event of a mental health crisis.  With written consent from the patient, two attempts were made to provide suicide prevention education, prior to and/or following the patient's discharge.  We were unsuccessful in providing suicide prevention education.  A suicide education pamphlet was given to the patient to share with family/significant other.  Date and time of first attempt:05/11/2023 at 1:25PM Date and time of second attempt: Second attempt needed.   CSW left HIPAA compliant voicemail.   Lowry Ram 05/12/2023, 11:23 AM

## 2023-05-12 NOTE — Group Note (Signed)
Date:  05/12/2023 Time:  1:49 PM  Group Topic/Focus:  Emotional Education:   The focus of this group is to discuss what feelings/emotions are, and how they are experienced. Recovery Goals:   The focus of this group is to identify appropriate goals for recovery and establish a plan to achieve them.    Participation Level:  Active  Participation Quality:  Appropriate  Affect:  Appropriate  Cognitive:  Appropriate  Insight: Appropriate  Engagement in Group:  Developing/Improving and Engaged  Modes of Intervention:  Activity, Discussion, and Education  Additional Comments:    Rosaura Carpenter 05/12/2023, 1:49 PM

## 2023-05-12 NOTE — Plan of Care (Signed)
Pt compliant with procedures on the unit, still presents with a bizarre affect and responding to internal stimuli  Problem: Education: Goal: Knowledge of [facility] Education information/materials will improve Outcome: Not Progressing Goal: Emotional status will improve Outcome: Not Progressing Goal: Mental status will improve Outcome: Not Progressing Goal: Verbalization of understanding the information provided will improve Outcome: Not Progressing   Problem: Activity: Goal: Interest or engagement in activities will improve Outcome: Not Progressing Goal: Sleeping patterns will improve Outcome: Not Progressing   Problem: Coping: Goal: Ability to verbalize frustrations and anger appropriately will improve Outcome: Not Progressing Goal: Ability to demonstrate self-control will improve Outcome: Not Progressing   Problem: Health Behavior/Discharge Planning: Goal: Identification of resources available to assist in meeting health care needs will improve Outcome: Not Progressing Goal: Compliance with treatment plan for underlying cause of condition will improve Outcome: Not Progressing   Problem: Physical Regulation: Goal: Ability to maintain clinical measurements within normal limits will improve Outcome: Not Progressing   Problem: Safety: Goal: Periods of time without injury will increase Outcome: Not Progressing   Problem: Activity: Goal: Will verbalize the importance of balancing activity with adequate rest periods Outcome: Not Progressing   Problem: Coping: Goal: Coping ability will improve Outcome: Not Progressing Goal: Will verbalize feelings Outcome: Not Progressing   Problem: Role Relationship: Goal: Ability to communicate needs accurately will improve Outcome: Not Progressing   Problem: Education: Goal: Knowledge of disease or condition will improve Outcome: Not Progressing Goal: Understanding of discharge needs will improve Outcome: Not  Progressing   Problem: Physical Regulation: Goal: Complications related to the disease process, condition or treatment will be avoided or minimized Outcome: Not Progressing   Problem: Activity: Goal: Will verbalize the importance of balancing activity with adequate rest periods Outcome: Not Progressing   Problem: Education: Goal: Will be free of psychotic symptoms Outcome: Not Progressing Goal: Knowledge of the prescribed therapeutic regimen will improve Outcome: Not Progressing   Problem: Coping: Goal: Coping ability will improve Outcome: Not Progressing Goal: Will verbalize feelings Outcome: Not Progressing   Problem: Health Behavior/Discharge Planning: Goal: Compliance with prescribed medication regimen will improve Outcome: Not Progressing   Problem: Nutritional: Goal: Ability to achieve adequate nutritional intake will improve Outcome: Not Progressing   Problem: Role Relationship: Goal: Ability to communicate needs accurately will improve Outcome: Not Progressing Goal: Ability to interact with others will improve Outcome: Not Progressing   Problem: Safety: Goal: Ability to redirect hostility and anger into socially appropriate behaviors will improve Outcome: Not Progressing Goal: Ability to remain free from injury will improve Outcome: Not Progressing   Problem: Self-Care: Goal: Ability to participate in self-care as condition permits will improve Outcome: Not Progressing   Problem: Self-Concept: Goal: Will verbalize positive feelings about self Outcome: Not Progressing   Problem: Education: Goal: Ability to make informed decisions regarding treatment will improve Outcome: Not Progressing   Problem: Coping: Goal: Coping ability will improve Outcome: Not Progressing   Problem: Health Behavior/Discharge Planning: Goal: Identification of resources available to assist in meeting health care needs will improve Outcome: Not Progressing   Problem:  Medication: Goal: Compliance with prescribed medication regimen will improve Outcome: Not Progressing   Problem: Self-Concept: Goal: Ability to disclose and discuss suicidal ideas will improve Outcome: Not Progressing Goal: Will verbalize positive feelings about self Outcome: Not Progressing Note: Patient is on track. Patient will maintain adherence

## 2023-05-12 NOTE — Group Note (Signed)
Date:  05/12/2023 Time:  10:25 PM  Group Topic/Focus:  Personal Choices and Values:   The focus of this group is to help patients assess and explore the importance of values in their lives, how their values affect their decisions, how they express their values and what opposes their expression.    Participation Level:  Active  Participation Quality:  Appropriate and Attentive  Affect:  Appropriate  Cognitive:  Alert and Appropriate  Insight: Appropriate, Good, and Improving  Engagement in Group:  Developing/Improving and Engaged  Modes of Intervention:  Activity, Clarification, Discussion, Rapport Building, Socialization, and Support  Additional Comments:     Keandra Medero 05/12/2023, 10:25 PM

## 2023-05-12 NOTE — Progress Notes (Signed)
   05/12/23 0824  Psych Admission Type (Psych Patients Only)  Admission Status Voluntary  Psychosocial Assessment  Patient Complaints Confusion  Eye Contact Fair  Facial Expression Animated  Affect Appropriate to circumstance  Speech Logical/coherent  Interaction Assertive  Motor Activity Fidgety  Appearance/Hygiene Unremarkable  Behavior Characteristics Cooperative;Fidgety  Mood Preoccupied  Thought Process  Coherency Blocking;Disorganized  Content Preoccupation  Delusions None reported or observed  Perception WDL  Hallucination None reported or observed  Judgment Impaired  Confusion Mild  Danger to Self  Current suicidal ideation? Denies  Agreement Not to Harm Self Yes  Description of Agreement verbal  Danger to Others  Danger to Others None reported or observed

## 2023-05-12 NOTE — Group Note (Signed)
Date:  05/12/2023 Time:  6:22 PM  Group Topic/Focus:  Healthy Communication:   The focus of this group is to discuss communication, barriers to communication, as well as healthy ways to communicate with others. Outdoor recreation structured activity    Participation Level:  Active  Participation Quality:  Appropriate  Affect:  Appropriate  Cognitive:  Alert and Appropriate  Insight: Appropriate  Engagement in Group:  Developing/Improving, Engaged, and Improving  Modes of Intervention:  Activity, Discussion, and Socialization  Additional Comments:    Rosaura Carpenter 05/12/2023, 6:22 PM

## 2023-05-12 NOTE — Group Note (Addendum)
[facility] LCSW Group Therapy Note   Group Date: 05/12/2023 Start Time: 1300 End Time: 1410  Type of Therapy/Topic:  Group Therapy:  Feelings about Diagnosis  Participation Level:  Active   Mood: Appropriate    Description of Group:    This group will allow patients to explore their thoughts and feelings about diagnoses they have received. Patients will be guided to explore their level of understanding and acceptance of these diagnoses. Facilitator will encourage patients to process their thoughts and feelings about the reactions of others to their diagnosis, and will guide patients in identifying ways to discuss their diagnosis with significant others in their lives. This group will be process-oriented, with patients participating in exploration of their own experiences as well as giving and receiving support and challenge from other group members.   Therapeutic Goals: 1. Patient will demonstrate understanding of diagnosis as evidence by identifying two or more symptoms of the disorder:  2. Patient will be able to express two feelings regarding the diagnosis 3. Patient will demonstrate ability to communicate their needs through discussion and/or role plays  Summary of Patient Progress: During group, patient was active and forthcoming with information. Patient is attention seeking and has a hard time focusing on group topic and discussion. With redirection, the patient is able to add to conversation. Patient was engaged and does well engaging in active listening and being respectful of peers. Patient usually asks questions during group and often takes up group time with personal stories and off topic banter. Towards the end of group, patient was found sleeping.   Therapeutic Modalities:   Cognitive Behavioral Therapy Brief Therapy Feelings Identification    Lowry Ram, LCSW

## 2023-05-12 NOTE — Progress Notes (Signed)
[facility] MD Progress Note  05/12/2023 6:38 PM Anthony Luna  MRN:  [id number] Subjective:  *** Principal Problem: Schizophrenia (HCC) Diagnosis: Principal Problem:   Schizophrenia (HCC)  Total Time spent with patient: {Time; 15 min - 8 hours:17441}  Past Psychiatric History: ***  Past Medical History:  Past Medical History:  Diagnosis Date   Depression    Since moving into new apartment 1 year ago   None to low serum cortisol response with adrenocorticotrophic hormone (ACTH) stimulation test    Schizophrenia, paranoid type (HCC)    History reviewed. No pertinent surgical history. Family History: History reviewed. No pertinent family history. Family Psychiatric  History: *** Social History:  Social History   Substance and Sexual Activity  Alcohol Use Yes   Alcohol/week: 0.0 standard drinks of alcohol   Comment: "Special occasions"     Social History   Substance and Sexual Activity  Drug Use Yes   Frequency: 4.0 times per week   Types: Marijuana   Comment: "2 blunts"    Social History   Socioeconomic History   Marital status: Single    Spouse name: Not on file   Number of children: Not on file   Years of education: Not on file   Highest education level: Not on file  Occupational History   Not on file  Tobacco Use   Smoking status: Every Day    Current packs/day: 0.50    Average packs/day: 0.5 packs/day for 20.4 years (10.2 ttl pk-yrs)    Types: Cigarettes    Start date: 11/29/2002   Smokeless tobacco: Former   Tobacco comments:    does not want to quit  Vaping Use   Vaping status: Never Used  Substance and Sexual Activity   Alcohol use: Yes    Alcohol/week: 0.0 standard drinks of alcohol    Comment: "Special occasions"   Drug use: Yes    Frequency: 4.0 times per week    Types: Marijuana    Comment: "2 blunts"   Sexual activity: Not on file  Other Topics Concern   Not on file  Social History Narrative   Not on file   Social Determinants of  Health   Financial Resource Strain: Not on file  Food Insecurity: Food Insecurity Present (05/10/2023)   Hunger Vital Sign    Worried About Running Out of Food in the Last Year: Sometimes true    Ran Out of Food in the Last Year: Often true  Transportation Needs: Unmet Transportation Needs (05/10/2023)   PRAPARE - Administrator, Civil Service (Medical): Yes    Lack of Transportation (Non-Medical): Yes  Physical Activity: Not on file  Stress: Not on file  Social Connections: Not on file   Additional Social History:                         Sleep: {[facility] GOOD/FAIR/POOR:22877}  Appetite:  {[facility] GOOD/FAIR/POOR:22877}  Current Medications: Current Facility-Administered Medications  Medication Dose Route Frequency Provider Last Rate Last Admin   acetaminophen (TYLENOL) tablet 650 mg  650 mg Oral Q6H PRN Jearld Lesch, NP       alum & mag hydroxide-simeth (MAALOX/MYLANTA) 200-200-20 MG/5ML suspension 30 mL  30 mL Oral Q4H PRN Dixon, Rashaun M, NP       benztropine (COGENTIN) tablet 1 mg  1 mg Oral BID Dixon, Rashaun M, NP   1 mg at 05/12/23 1728   buPROPion (WELLBUTRIN XL) 24 hr tablet 150 mg  150 mg Oral Daily Lerry Liner M, NP   150 mg at 05/12/23 2440   clonazePAM (KLONOPIN) tablet 0.5 mg  0.5 mg Oral BID Lerry Liner M, NP   0.5 mg at 05/12/23 1727   haloperidol lactate (HALDOL) injection 5 mg  5 mg Intramuscular TID PRN Jearld Lesch, NP       And   diphenhydrAMINE (BENADRYL) injection 50 mg  50 mg Intramuscular TID PRN Jearld Lesch, NP       And   LORazepam (ATIVAN) injection 2 mg  2 mg Intramuscular TID PRN Jearld Lesch, NP       hydrOXYzine (ATARAX) tablet 25 mg  25 mg Oral TID PRN Jearld Lesch, NP   25 mg at 05/12/23 0239   magnesium hydroxide (MILK OF MAGNESIA) suspension 30 mL  30 mL Oral Daily PRN Jearld Lesch, NP       melatonin tablet 5 mg  5 mg Oral QHS Dixon, Rashaun M, NP   5 mg at 05/11/23 2119   montelukast (SINGULAIR)  tablet 10 mg  10 mg Oral QHS Jearld Lesch, NP   10 mg at 05/11/23 2119   nicotine (NICODERM CQ - dosed in mg/24 hours) patch 14 mg  14 mg Transdermal Daily Sarina Ill, DO   14 mg at 05/12/23 0824   OLANZapine (ZYPREXA) tablet 10 mg  10 mg Oral Q6H PRN Sarina Ill, DO       propranolol (INDERAL) tablet 20 mg  20 mg Oral BID Durwin Nora, Rashaun M, NP   20 mg at 05/12/23 1728   risperiDONE (RISPERDAL) tablet 1 mg  1 mg Oral BH-[id number] Sarina Ill, DO   1 mg at 05/12/23 1700   traZODone (DESYREL) tablet 50 mg  50 mg Oral QHS PRN Jearld Lesch, NP       Melene Muller ON 05/18/2023] Vitamin D (Ergocalciferol) (DRISDOL) 1.25 MG (50000 UNIT) capsule 50,000 Units  50,000 Units Oral Q7 days Jearld Lesch, NP        Lab Results:  Results for orders placed or performed during the hospital encounter of 05/10/23 (from the past 48 hour(s))  Lipid panel     Status: None   Collection Time: 05/11/23  7:02 AM  Result Value Ref Range   Cholesterol 132 0 - 200 mg/dL   Triglycerides 33 <102 mg/dL   HDL 54 >72 mg/dL   Total CHOL/HDL Ratio 2.4 RATIO   VLDL 7 0 - 40 mg/dL   LDL Cholesterol 71 0 - 99 mg/dL    Comment:        Total Cholesterol/HDL:CHD Risk Coronary Heart Disease Risk Table                     Men   Women  1/2 Average Risk   3.4   3.3  Average Risk       5.0   4.4  2 X Average Risk   9.6   7.1  3 X Average Risk  23.4   11.0        Use the calculated Patient Ratio above and the CHD Risk Table to determine the patient's CHD Risk.        ATP III CLASSIFICATION (LDL):  <100     mg/dL   Optimal  536-644  mg/dL   Near or Above                    Optimal  130-159  mg/dL   Borderline  784-696  mg/dL   High  >295     mg/dL   Very High Performed at [facility], [address] [place], [place] [address]   Hemoglobin A1c     Status: None   Collection Time: 05/11/23  7:02 AM  Result Value Ref Range   Hgb A1c MFr Bld 5.5 4.8 - 5.6 %    Comment:  (NOTE) Pre diabetes:          5.7%-6.4%  Diabetes:              >6.4%  Glycemic control for   <7.0% adults with diabetes    Mean Plasma Glucose 111.15 mg/dL    Comment: Performed at [facility], [address] [place], [place] [address]    Blood Alcohol level:  Lab Results  Component Value Date   [facility] <10 05/09/2023   ETH <10 04/27/2023    Metabolic Disorder Labs: Lab Results  Component Value Date   HGBA1C 5.5 05/11/2023   MPG 111.15 05/11/2023   MPG 99.67 04/30/2022   Lab Results  Component Value Date   PROLACTIN 6.1 12/19/2014   PROLACTIN 1.3 (L) 11/30/2014   Lab Results  Component Value Date   CHOL 132 05/11/2023   TRIG 33 05/11/2023   HDL 54 05/11/2023   CHOLHDL 2.4 05/11/2023   VLDL 7 05/11/2023   LDLCALC 71 05/11/2023   LDLCALC 127 (H) 04/30/2022    Physical Findings: AIMS:  , ,  ,  ,    CIWA:    COWS:     Musculoskeletal: Strength & Muscle Tone: {desc; muscle tone:32375} Gait & Station: {PE GAIT ED UUVO:53664} Patient leans: {Patient Leans:21022755}  Psychiatric Specialty Exam:  Presentation  General Appearance:  Bizarre  Eye Contact: Good  Speech: Clear and Coherent  Speech Volume: Normal  Handedness: Right   Mood and Affect  Mood: Euthymic  Affect: Full Range   Thought Process  Thought Processes: Disorganized  Descriptions of Associations:Loose  Orientation:Full (Time, Place and Person)  Thought Content:Illogical; Tangential; Scattered  History of Schizophrenia/Schizoaffective disorder:Yes  Duration of Psychotic Symptoms:Greater than six months  Hallucinations:No data recorded Ideas of Reference:Paranoia; Percusatory  Suicidal Thoughts:No data recorded Homicidal Thoughts:No data recorded  Sensorium  Memory: Immediate Poor; Remote Poor  Judgment: Impaired  Insight: Shallow   Executive Functions  Concentration: Poor  Attention Span: Poor  Recall: Poor  Fund of  Knowledge: Poor  Language: Poor   Psychomotor Activity  Psychomotor Activity:No data recorded  Assets  Assets: Communication Skills; Desire for Improvement; Financial Resources/Insurance; Resilience   Sleep  Sleep:No data recorded   Physical Exam: Physical Exam Vitals and nursing note reviewed.  Constitutional:      Appearance: Normal appearance.  HENT:     Head: Normocephalic and atraumatic.     Nose: Nose normal.  Pulmonary:     Effort: Pulmonary effort is normal.  Musculoskeletal:        General: Normal range of motion.     Cervical back: Normal range of motion.  Neurological:     General: No focal deficit present.     Mental Status: He is alert.  Psychiatric:        Attention and Perception: Attention normal. He perceives auditory hallucinations.        Mood and Affect: Affect is blunt and flat.        Speech: Speech normal.        Behavior: Behavior normal. Behavior  is cooperative.        Thought Content: Thought content normal.        Cognition and Memory: Cognition and memory normal.        Judgment: Judgment is impulsive.    Review of Systems  Psychiatric/Behavioral:  Positive for substance abuse. The patient is nervous/anxious.   All other systems reviewed and are negative.  Blood pressure 125/76, pulse 98, temperature 98.5 F (36.9 C), temperature source Oral, resp. rate 20, height 6' (1.829 m), weight 82.1 kg, SpO2 99%. Body mass index is 24.55 kg/m.   Treatment Plan Summary: Daily contact with patient to assess and evaluate symptoms and progress in treatment and Medication management  Myriam Forehand, NP 05/12/2023, 6:38 PM

## 2023-05-12 NOTE — [facility] Suicide Risk Assessment (Signed)
[facility] INPATIENT:  Family/Significant Other Suicide Prevention Education  Suicide Prevention Education:  Contact Attempts: : Adriana Reams, Group Home director, [phone], has been identified by the patient as the family member/significant other with whom the patient will be residing, and identified as the person(s) who will aid the patient in the event of a mental health crisis.  With written consent from the patient, two attempts were made to provide suicide prevention education, prior to and/or following the patient's discharge.  We were unsuccessful in providing suicide prevention education.  A suicide education pamphlet was given to the patient to share with family/significant other.  Date and time of first attempt:05/11/2023 at 1:25 PM  Date and time of second attempt:05/12/23 at 11:30 AM  CSW was unable to leave HIPAA compliant voicemail as the mailbox was full.   Lowry Ram 05/12/2023, 11:30 AM

## 2023-05-13 DIAGNOSIS — F201 Disorganized schizophrenia: Secondary | ICD-10-CM | POA: Diagnosis not present

## 2023-05-13 NOTE — Plan of Care (Signed)
Pt denies SI/HI/AVH, compliant with procedures on the unit  Problem: Education: Goal: Knowledge of [facility] Education information/materials will improve Outcome: Progressing Goal: Emotional status will improve Outcome: Progressing Goal: Mental status will improve Outcome: Progressing Goal: Verbalization of understanding the information provided will improve Outcome: Progressing   Problem: Activity: Goal: Interest or engagement in activities will improve Outcome: Progressing Goal: Sleeping patterns will improve Outcome: Progressing   Problem: Coping: Goal: Ability to verbalize frustrations and anger appropriately will improve Outcome: Progressing Goal: Ability to demonstrate self-control will improve Outcome: Progressing   Problem: Health Behavior/Discharge Planning: Goal: Identification of resources available to assist in meeting health care needs will improve Outcome: Progressing Goal: Compliance with treatment plan for underlying cause of condition will improve Outcome: Progressing   Problem: Physical Regulation: Goal: Ability to maintain clinical measurements within normal limits will improve Outcome: Progressing   Problem: Safety: Goal: Periods of time without injury will increase Outcome: Progressing   Problem: Activity: Goal: Will verbalize the importance of balancing activity with adequate rest periods Outcome: Progressing   Problem: Coping: Goal: Coping ability will improve Outcome: Progressing Goal: Will verbalize feelings Outcome: Progressing   Problem: Role Relationship: Goal: Ability to communicate needs accurately will improve Outcome: Progressing   Problem: Education: Goal: Knowledge of disease or condition will improve Outcome: Progressing Goal: Understanding of discharge needs will improve Outcome: Progressing   Problem: Physical Regulation: Goal: Complications related to the disease process, condition or treatment will be avoided  or minimized Outcome: Progressing   Problem: Activity: Goal: Will verbalize the importance of balancing activity with adequate rest periods Outcome: Progressing   Problem: Education: Goal: Will be free of psychotic symptoms Outcome: Progressing Goal: Knowledge of the prescribed therapeutic regimen will improve Outcome: Progressing   Problem: Coping: Goal: Coping ability will improve Outcome: Progressing Goal: Will verbalize feelings Outcome: Progressing   Problem: Health Behavior/Discharge Planning: Goal: Compliance with prescribed medication regimen will improve Outcome: Progressing   Problem: Nutritional: Goal: Ability to achieve adequate nutritional intake will improve Outcome: Progressing   Problem: Role Relationship: Goal: Ability to communicate needs accurately will improve Outcome: Progressing Goal: Ability to interact with others will improve Outcome: Progressing   Problem: Safety: Goal: Ability to redirect hostility and anger into socially appropriate behaviors will improve Outcome: Progressing Goal: Ability to remain free from injury will improve Outcome: Progressing   Problem: Self-Care: Goal: Ability to participate in self-care as condition permits will improve Outcome: Progressing   Problem: Self-Concept: Goal: Will verbalize positive feelings about self Outcome: Progressing   Problem: Education: Goal: Ability to make informed decisions regarding treatment will improve Outcome: Progressing   Problem: Coping: Goal: Coping ability will improve Outcome: Progressing   Problem: Health Behavior/Discharge Planning: Goal: Identification of resources available to assist in meeting health care needs will improve Outcome: Progressing   Problem: Medication: Goal: Compliance with prescribed medication regimen will improve Outcome: Progressing   Problem: Self-Concept: Goal: Ability to disclose and discuss suicidal ideas will improve Outcome:  Progressing Goal: Will verbalize positive feelings about self Outcome: Progressing Note: Patient is on track. Patient will maintain adherence

## 2023-05-13 NOTE — Group Note (Signed)
Date:  05/13/2023 Time:  10:06 AM  Group Topic/Focus:  Goals Group:   The focus of this group is to help patients establish daily goals to achieve during treatment and discuss how the patient can incorporate goal setting into their daily lives to aide in recovery.    Participation Level:  Active  Participation Quality:  Appropriate  Affect:  Appropriate  Cognitive:  Appropriate  Insight: Appropriate  Engagement in Group:  Engaged  Modes of Intervention:  Discussion, Education, and Support  Additional Comments:    Wilford Corner 05/13/2023, 10:06 AM

## 2023-05-13 NOTE — Plan of Care (Signed)
  Problem: Health Behavior/Discharge Planning: Goal: Compliance with treatment plan for underlying cause of condition will improve Outcome: Progressing   Problem: Safety: Goal: Periods of time without injury will increase Outcome: Progressing   Problem: Safety: Goal: Ability to remain free from injury will improve Outcome: Progressing

## 2023-05-13 NOTE — Progress Notes (Signed)
Patient appeared very euthymic early this morning, singing, and greeting everyone. Patient denies SI,HI, and A/V/H with no plan or intent. Patient is med compliant and redirectable.     05/13/23 0800  Psych Admission Type (Psych Patients Only)  Admission Status Voluntary  Psychosocial Assessment  Patient Complaints None  Eye Contact Fair  Facial Expression Animated  Affect Preoccupied  Speech Logical/coherent  Interaction Assertive  Motor Activity Fidgety  Appearance/Hygiene Unremarkable  Behavior Characteristics Cooperative;Fidgety  Mood Euthymic;Preoccupied  Thought Process  Coherency Blocking;Disorganized  Content Preoccupation  Delusions None reported or observed  Perception WDL  Hallucination None reported or observed  Judgment Impaired  Confusion Mild  Danger to Self  Current suicidal ideation? Denies  Agreement Not to Harm Self Yes  Description of Agreement verbal  Danger to Others  Danger to Others None reported or observed

## 2023-05-13 NOTE — Group Note (Signed)
Date:  05/13/2023 Time:  10:00 PM  Group Topic/Focus:  Wrap-Up Group:   The focus of this group is to help patients review their daily goal of treatment and discuss progress on daily workbooks.    Participation Level:  Active  Participation Quality:  Appropriate  Affect:  Appropriate  Cognitive:  Appropriate  Insight: Appropriate  Engagement in Group:  Engaged  Modes of Intervention:  Discussion    Lenore Cordia 05/13/2023, 10:00 PM

## 2023-05-13 NOTE — Group Note (Signed)
Date:  05/13/2023 Time:  5:22 PM  Group Topic/Focus:  Self Care:   The focus of this group is to help patients understand the importance of self-care in order to improve or restore emotional, physical, spiritual, interpersonal, and financial health.     Participation Level:  Active  Participation Quality:  Appropriate  Affect:  Appropriate  Cognitive:  Appropriate  Insight: Appropriate  Engagement in Group:  Engaged  Modes of Intervention:  Activity and Socialization  Additional Comments:    Wilford Corner 05/13/2023, 5:22 PM

## 2023-05-13 NOTE — Clinical Social Work Note (Signed)
CSW reached out to patient's case worker, [name] at [phone] with [facility]. Zella Ball is to reach out to patient's group home director, Adriana Reams as care team's attempts have been unsuccessful.   At this time, Christell Constant is unsure of the patient's standing with his group home and if being discharged there would be an option.   CSW explained that a safe discharge is the care team's goal for the patient. Moore agreed and plans to touch base with CSW once she receives further information from [name].   Care team made aware of conversation.    Reymundo Poll, MSW, LCSWA 05/13/2023 12:10 PM

## 2023-05-13 NOTE — Group Note (Signed)
Recreation Therapy Group Note   Group Topic:Emotion Expression  Group Date: 05/13/2023 Start Time: 1045 End Time: 1135 Facilitators: Rosina Lowenstein, LRT, CTRS Location:  Craft Room  Group Description: Gratitude Journaling. Patients and LRT discussed what gratitude means, how we can express it and what it means to [place], personally. LRT gave an education handout on the definition of gratitude that also gave different examples of gratitude exercises that they could try. One of the examples was "Gratitude Letter", which prompted patient to write a letter to someone they appreciate. LRT played soft music while everyone wrote their letter. Once letter was completed, LRT encouraged people to read their letter if they wanted to; or share who they wrote it to, at minimum. LRT and pts talked about the benefits of journaling and how it can be used as a positive coping skill. LRT and pts processed how showing gratitude towards themselves, and others can be applied to everyday life post-discharge. LRT offered journals to pts afterwards.   Goal Area(s) Addressed:  Patient will identify the definition of gratitude. Patient will learn different gratitude exercises. Patient will practice writing/journaling as a coping skill.  Patient will identify a new coping skill.   Affect/Mood: Flat and Labile   Participation Level: Active   Participation Quality: Independent   Behavior: Bizarre   Speech/Thought Process: Coherent and Loose association   Insight: Good   Judgement: Fair    Modes of Intervention: Activity, Education, and Writing   Patient Response to Interventions:  Receptive   Education Outcome:  In group clarification offered    Clinical Observations/Individualized Feedback: [place] was mostly active in their participation of session activities and group discussion. Pt was in and out of group multiple times. Pt was noted to slam marker down and massage his hand, as if his hand was hurting,  multiple times while writing. Pt asked to be excused, however, was overheard reading his letter to [facility]. Pt shared: "I am thankful for another day to better myself and those I have wronged. I am thankful for food, clothes, and another chance in life". Pt minimally interacted well with LRT and peers while in group. Pt was not present for most of the processing discussion.    Plan: Continue to engage patient in RT group sessions 2-3x/week.   Rosina Lowenstein, LRT, CTRS 05/13/2023 11:49 AM

## 2023-05-14 DIAGNOSIS — F200 Paranoid schizophrenia: Secondary | ICD-10-CM | POA: Diagnosis not present

## 2023-05-14 NOTE — Progress Notes (Incomplete)
D- Patient alert and oriented. Affect/mood. Denies SI/ HI/ AVH. Patient denies pain. Patient endorses depression and anxiety. Goal of the day. A- Scheduled medications administered to patient, per MD orders. Support and encouragement provided.  Routine safety checks conducted every 15 minutes without incident.  Patient informed to notify staff with problems or concerns and verbalizes understanding. R- No adverse drug reactions noted.  Patient compliant with medications and treatment plan. Patient receptive, calm cooperative and interacts well with others on the unit.  Patient contracts for safety and  remains safe on the unit at this time.

## 2023-05-14 NOTE — Progress Notes (Signed)
   05/14/23 0900  Psych Admission Type (Psych Patients Only)  Admission Status Voluntary  Psychosocial Assessment  Patient Complaints Depression  Eye Contact Fair  Facial Expression Flat  Affect Depressed  Speech Soft  Interaction Assertive  Motor Activity Pacing  Appearance/Hygiene Unremarkable  Behavior Characteristics Cooperative  Mood Preoccupied;Depressed  Thought Process  Coherency Disorganized  Content Preoccupation  Delusions None reported or observed  Perception UTA  Hallucination None reported or observed  Judgment Impaired  Confusion Mild  Danger to Self  Current suicidal ideation? Denies  Agreement Not to Harm Self Yes  Description of Agreement verbal  Danger to Others  Danger to Others None reported or observed

## 2023-05-14 NOTE — Group Note (Signed)
Date:  05/14/2023 Time:  7:07 PM  Group Topic/Focus:  Activity Group:  The focus of the group is to promote activity with the patients to encourage them to go outside to the courtyard to get some fresh air and some exercise.     Participation Level:  Active  Participation Quality:  Appropriate  Affect:  Appropriate  Cognitive:  Appropriate  Insight: Appropriate  Engagement in Group:  Engaged  Modes of Intervention:  Activity  Additional Comments:    Mary Sella Reneka Nebergall 05/14/2023, 7:07 PM

## 2023-05-14 NOTE — Progress Notes (Signed)
   05/13/23 1939  Psych Admission Type (Psych Patients Only)  Admission Status Voluntary  Psychosocial Assessment  Patient Complaints Anxiety  Eye Contact Fair  Facial Expression Anxious  Affect Preoccupied  Speech Logical/coherent  Interaction Assertive  Motor Activity Fidgety  Appearance/Hygiene Unremarkable  Behavior Characteristics Cooperative;Fidgety  Mood Euthymic;Preoccupied  Thought Process  Coherency Blocking;Disorganized  Content Preoccupation  Delusions None reported or observed  Perception WDL  Hallucination None reported or observed  Judgment Impaired  Confusion Mild  Danger to Self  Current suicidal ideation? Denies  Agreement Not to Harm Self Yes  Description of Agreement verbal  Danger to Others  Danger to Others None reported or observed

## 2023-05-14 NOTE — Plan of Care (Signed)
  Problem: Education: Goal: Knowledge of [facility] Education information/materials will improve Outcome: Progressing Goal: Emotional status will improve Outcome: Progressing Goal: Mental status will improve Outcome: Progressing   

## 2023-05-14 NOTE — Plan of Care (Signed)
  Problem: Education: Goal: Knowledge of [facility] Education information/materials will improve Outcome: Progressing Goal: Emotional status will improve Outcome: Progressing Goal: Mental status will improve Outcome: Progressing Goal: Verbalization of understanding the information provided will improve Outcome: Progressing   Problem: Activity: Goal: Interest or engagement in activities will improve Outcome: Progressing Goal: Sleeping patterns will improve Outcome: Progressing   Problem: Coping: Goal: Ability to verbalize frustrations and anger appropriately will improve Outcome: Progressing Goal: Ability to demonstrate self-control will improve Outcome: Progressing   Problem: Health Behavior/Discharge Planning: Goal: Identification of resources available to assist in meeting health care needs will improve Outcome: Progressing Goal: Compliance with treatment plan for underlying cause of condition will improve Outcome: Progressing   Problem: Physical Regulation: Goal: Ability to maintain clinical measurements within normal limits will improve Outcome: Progressing   Problem: Safety: Goal: Periods of time without injury will increase Outcome: Progressing   Problem: Activity: Goal: Will verbalize the importance of balancing activity with adequate rest periods Outcome: Progressing   Problem: Coping: Goal: Coping ability will improve Outcome: Progressing Goal: Will verbalize feelings Outcome: Progressing   Problem: Role Relationship: Goal: Ability to communicate needs accurately will improve Outcome: Progressing   Problem: Education: Goal: Knowledge of disease or condition will improve Outcome: Progressing Goal: Understanding of discharge needs will improve Outcome: Progressing   Problem: Physical Regulation: Goal: Complications related to the disease process, condition or treatment will be avoided or minimized Outcome: Progressing   Problem:  Activity: Goal: Will verbalize the importance of balancing activity with adequate rest periods Outcome: Progressing   Problem: Education: Goal: Will be free of psychotic symptoms Outcome: Progressing Goal: Knowledge of the prescribed therapeutic regimen will improve Outcome: Progressing   Problem: Coping: Goal: Coping ability will improve Outcome: Progressing Goal: Will verbalize feelings Outcome: Progressing   Problem: Health Behavior/Discharge Planning: Goal: Compliance with prescribed medication regimen will improve Outcome: Progressing   Problem: Nutritional: Goal: Ability to achieve adequate nutritional intake will improve Outcome: Progressing   Problem: Role Relationship: Goal: Ability to communicate needs accurately will improve Outcome: Progressing Goal: Ability to interact with others will improve Outcome: Progressing   Problem: Safety: Goal: Ability to redirect hostility and anger into socially appropriate behaviors will improve Outcome: Progressing Goal: Ability to remain free from injury will improve Outcome: Progressing   Problem: Self-Care: Goal: Ability to participate in self-care as condition permits will improve Outcome: Progressing   Problem: Self-Concept: Goal: Will verbalize positive feelings about self Outcome: Progressing   Problem: Education: Goal: Ability to make informed decisions regarding treatment will improve Outcome: Progressing   Problem: Coping: Goal: Coping ability will improve Outcome: Progressing   Problem: Health Behavior/Discharge Planning: Goal: Identification of resources available to assist in meeting health care needs will improve Outcome: Progressing   Problem: Medication: Goal: Compliance with prescribed medication regimen will improve Outcome: Progressing   Problem: Self-Concept: Goal: Ability to disclose and discuss suicidal ideas will improve Outcome: Progressing Goal: Will verbalize positive feelings about  self Outcome: Progressing Note:

## 2023-05-14 NOTE — BHH Counselor (Signed)
CSW attempted to reach the patient group home, [facility], [phone].  CSW left HIPAA compliant voicemail.  Penni Homans, MSW, LCSW 05/14/2023 1:11 PM

## 2023-05-14 NOTE — Group Note (Signed)
Date:  05/14/2023 Time:  10:18 AM  Group Topic/Focus:  Goals Group:   The focus of this group is to help patients establish daily goals to achieve during treatment and discuss how the patient can incorporate goal setting into their daily lives to aide in recovery.    Participation Level:  Active  Participation Quality:  Intrusive and Monopolizing  Affect:  Labile and Not Congruent  Cognitive:  Delusional  Insight: Limited  Engagement in Group:  Defensive and Monopolizing  Modes of Intervention:  Discussion, Education, and Support  Additional Comments:    Wilford Corner 05/14/2023, 10:18 AM

## 2023-05-15 DIAGNOSIS — F201 Disorganized schizophrenia: Secondary | ICD-10-CM | POA: Diagnosis not present

## 2023-05-15 NOTE — BHH Counselor (Signed)
CSW made contact with Leonette Most, case manager at [phone] with patient's consent for the patient to get access to his apartment. Charles did not answer. CSW left HIPAA compliant voicemail.    Reymundo Poll, MSW, LCSWA 05/15/2023 4:14 PM

## 2023-05-15 NOTE — BHH Counselor (Signed)
CSW spoke with [facility] about patient being discharged and returned to her group home.  She reports that patient is no longer a resident there and moved out 2 months ago.  She reports that pt attempted to return three times, however, he would leave each time.  She reports that the last time she asked patient not to return.    She reports that patient is his own guardian and stays in his own apartment.  She provided the following address [address], [place], [place] [address].  CSW spoke with patient and confirmed that this is where he lives.   CSW spoke with Zella Ball, on pt's care team with [facility], [phone] for further confirmation.  She reports that she is not the individual to speak with at this time on that.  She requests that CSW contact the patient's housing coordinator, Jillyn Hidden, [phone].  CSW attempted to call Jillyn Hidden and left HIPAA complaint voicemail requesting a return call.  Penni Homans, MSW, LCSW 05/15/2023 10:37 AM

## 2023-05-15 NOTE — Progress Notes (Signed)
   05/15/23 0900  Psych Admission Type (Psych Patients Only)  Admission Status Voluntary  Psychosocial Assessment  Patient Complaints None  Eye Contact Intense;Watchful  Facial Expression Flat  Affect Preoccupied  Speech Soft  Interaction Assertive  Motor Activity Pacing  Appearance/Hygiene Unremarkable  Behavior Characteristics Cooperative  Mood Preoccupied  Thought Process  Coherency Disorganized  Content Preoccupation  Delusions None reported or observed  Perception UTA  Hallucination None reported or observed  Judgment Impaired  Confusion Mild  Danger to Self  Current suicidal ideation? Denies  Agreement Not to Harm Self Yes  Description of Agreement verbal  Danger to Others  Danger to Others None reported or observed   Patient alert and oriented. Denies anxiety/depression as well as denies SI, HI, AVH, and pain. Scheduled medications administered to patient, per provider orders. Patient refused scheduled propanolol, and stated  "I don't have anything wrong with my blood pressure". Keith Rake, NP made aware. Support and encouragement provided. Routine safety checks conducted every 15 minutes. Patient remains safe on the unit.

## 2023-05-15 NOTE — Group Note (Signed)
Date:  05/15/2023 Time:  5:28 PM  Group Topic/Focus:  Activity Group:  The focus of the group is to promote activity for the patients and encourage them to go outside in the courtyard and get some fresh air and some exercise.    Participation Level:  Active  Participation Quality:  Appropriate  Affect:  Appropriate  Cognitive:  Appropriate  Insight: Appropriate  Engagement in Group:  Engaged  Modes of Intervention:  Activity  Additional Comments:    Mary Sella Marieta Markov 05/15/2023, 5:28 PM

## 2023-05-15 NOTE — Plan of Care (Signed)
  Problem: Education: Goal: Mental status will improve Outcome: Progressing   Problem: Activity: Goal: Interest or engagement in activities will improve Outcome: Progressing   Problem: Safety: Goal: Periods of time without injury will increase Outcome: Progressing   Problem: Coping: Goal: Coping ability will improve Outcome: Progressing

## 2023-05-15 NOTE — Group Note (Signed)
[facility] LCSW Group Therapy Note   Group Date: 05/15/2023 Start Time: 1300 End Time: 1400   Type of Therapy/Topic:  Group Therapy:  Emotion Regulation  Participation Level:  Active   Mood: Irritated   Description of Group:    The purpose of this group is to assist patients in learning to regulate negative emotions and experience positive emotions. Patients will be guided to discuss ways in which they have been vulnerable to their negative emotions. These vulnerabilities will be juxtaposed with experiences of positive emotions or situations, and patients challenged to use positive emotions to combat negative ones. Special emphasis will be placed on coping with negative emotions in conflict situations, and patients will process healthy conflict resolution skills.  Therapeutic Goals: Patient will identify two positive emotions or experiences to reflect on in order to balance out negative emotions:  Patient will label two or more emotions that they find the most difficult to experience:  Patient will be able to demonstrate positive conflict resolution skills through discussion or role plays:   Summary of Patient Progress: During group, patient was agitated and voiced his agitation with the group. Facilitator worked to redirect the patient. While expressing, the patient became somewhat irate and stormed out of group. Group members were disturbed by the patient's behaviors and eventually, the patient returned to group. Once returning, the patient rested with his head on the table and no longer engaged in group.  Therapeutic Modalities:   Cognitive Behavioral Therapy Feelings Identification Dialectical Behavioral Therapy   Lowry Ram, LCSW

## 2023-05-15 NOTE — Progress Notes (Signed)
[facility] MD Progress Note  05/15/2023 6:00 PM Anthony Luna  MRN:  [id number] Subjective:  37 year old African American male, was reported from the previous shift as pacing throughout the night and not sleeping. During the writer's attempt to engage the patient, he became agitated, stating, "I am my own guardian; I am here voluntarily, I can leave anytime I want." The patient also reported being locked out of his apartment due to the leasing manager and stated, "The apartment was trashed." He expressed plans to "open a window" to gain access, which is not deemed suitable or safe.The patient displayed increasing agitation and irritability, which escalated during group therapy, causing distress to other group members. He accepted medication when offered.Patient is alert and oriented but visibly agitated and irritable. Exhibited pacing behavior during the night and lashed out during group therapy. Reports frustration and distress regarding his living situation. Accepted medication during this interactio Principal Problem: Schizophrenia (HCC) Diagnosis: Principal Problem:   Schizophrenia (HCC)  Total Time spent with patient: 3 hours  Past Psychiatric History: Schizophrenia   Past Medical History:  Past Medical History:  Diagnosis Date   Depression    Since moving into new apartment 1 year ago   None to low serum cortisol response with adrenocorticotrophic hormone (ACTH) stimulation test    Schizophrenia, paranoid type (HCC)    History reviewed. No pertinent surgical history. Family History: History reviewed. No pertinent family history. Family Psychiatric  History: none noted Social History:  Social History   Substance and Sexual Activity  Alcohol Use Yes   Alcohol/week: 0.0 standard drinks of alcohol   Comment: "Special occasions"     Social History   Substance and Sexual Activity  Drug Use Yes   Frequency: 4.0 times per week   Types: Marijuana   Comment: "2 blunts"     Social History   Socioeconomic History   Marital status: Single    Spouse name: Not on file   Number of children: Not on file   Years of education: Not on file   Highest education level: Not on file  Occupational History   Not on file  Tobacco Use   Smoking status: Every Day    Current packs/day: 0.50    Average packs/day: 0.5 packs/day for 20.5 years (10.2 ttl pk-yrs)    Types: Cigarettes    Start date: 11/29/2002   Smokeless tobacco: Former   Tobacco comments:    does not want to quit  Vaping Use   Vaping status: Never Used  Substance and Sexual Activity   Alcohol use: Yes    Alcohol/week: 0.0 standard drinks of alcohol    Comment: "Special occasions"   Drug use: Yes    Frequency: 4.0 times per week    Types: Marijuana    Comment: "2 blunts"   Sexual activity: Not on file  Other Topics Concern   Not on file  Social History Narrative   Not on file   Social Determinants of Health   Financial Resource Strain: Not on file  Food Insecurity: Food Insecurity Present (05/10/2023)   Hunger Vital Sign    Worried About Running Out of Food in the Last Year: Sometimes true    Ran Out of Food in the Last Year: Often true  Transportation Needs: Unmet Transportation Needs (05/10/2023)   PRAPARE - Administrator, Civil Service (Medical): Yes    Lack of Transportation (Non-Medical): Yes  Physical Activity: Not on file  Stress: Not on file  Social Connections:  Not on file   Additional Social History:                         Sleep: Poor  Appetite:  Good  Current Medications: Current Facility-Administered Medications  Medication Dose Route Frequency Provider Last Rate Last Admin   acetaminophen (TYLENOL) tablet 650 mg  650 mg Oral Q6H PRN Jearld Lesch, NP       alum & mag hydroxide-simeth (MAALOX/MYLANTA) 200-200-20 MG/5ML suspension 30 mL  30 mL Oral Q4H PRN Durwin Nora, Rashaun M, NP       benztropine (COGENTIN) tablet 1 mg  1 mg Oral BID Dixon,  Rashaun M, NP   1 mg at 05/15/23 1722   buPROPion (WELLBUTRIN XL) 24 hr tablet 150 mg  150 mg Oral Daily Lerry Liner M, NP   150 mg at 05/15/23 8469   clonazePAM (KLONOPIN) tablet 0.5 mg  0.5 mg Oral BID Lerry Liner M, NP   0.5 mg at 05/15/23 1722   haloperidol lactate (HALDOL) injection 5 mg  5 mg Intramuscular TID PRN Jearld Lesch, NP       And   diphenhydrAMINE (BENADRYL) injection 50 mg  50 mg Intramuscular TID PRN Jearld Lesch, NP       And   LORazepam (ATIVAN) injection 2 mg  2 mg Intramuscular TID PRN Jearld Lesch, NP       hydrOXYzine (ATARAX) tablet 25 mg  25 mg Oral TID PRN Jearld Lesch, NP   25 mg at 05/15/23 1556   magnesium hydroxide (MILK OF MAGNESIA) suspension 30 mL  30 mL Oral Daily PRN Jearld Lesch, NP       melatonin tablet 5 mg  5 mg Oral QHS Dixon, Rashaun M, NP   5 mg at 05/14/23 2136   montelukast (SINGULAIR) tablet 10 mg  10 mg Oral QHS Dixon, Rashaun M, NP   10 mg at 05/14/23 2136   nicotine (NICODERM CQ - dosed in mg/24 hours) patch 14 mg  14 mg Transdermal Daily Sarina Ill, DO   14 mg at 05/14/23 0834   OLANZapine (ZYPREXA) tablet 10 mg  10 mg Oral Q6H PRN Sarina Ill, DO   10 mg at 05/15/23 1556   propranolol (INDERAL) tablet 20 mg  20 mg Oral BID Lerry Liner M, NP   20 mg at 05/15/23 1722   risperiDONE (RISPERDAL) tablet 1 mg  1 mg Oral BH-q8a4p Sarina Ill, DO   1 mg at 05/15/23 1722   traZODone (DESYREL) tablet 50 mg  50 mg Oral QHS PRN Jearld Lesch, NP   50 mg at 05/14/23 2136   [START ON 05/18/2023] Vitamin D (Ergocalciferol) (DRISDOL) 1.25 MG (50000 UNIT) capsule 50,000 Units  50,000 Units Oral Q7 days Jearld Lesch, NP        Lab Results: No results found for this or any previous visit (from the past 48 hour(s)).  Blood Alcohol level:  Lab Results  Component Value Date   [facility] <10 05/09/2023   ETH <10 04/27/2023    Metabolic Disorder Labs: Lab Results  Component Value Date   HGBA1C  5.5 05/11/2023   MPG 111.15 05/11/2023   MPG 99.67 04/30/2022   Lab Results  Component Value Date   PROLACTIN 6.1 12/19/2014   PROLACTIN 1.3 (L) 11/30/2014   Lab Results  Component Value Date   CHOL 132 05/11/2023   TRIG 33 05/11/2023   HDL 54 05/11/2023  CHOLHDL 2.4 05/11/2023   VLDL 7 05/11/2023   LDLCALC 71 05/11/2023   LDLCALC 127 (H) 04/30/2022    Physical Findings: AIMS:  , ,  ,  ,    CIWA:    COWS:     Musculoskeletal: Strength & Muscle Tone: within normal limits Gait & Station: normal Patient leans: N/A  Psychiatric Specialty Exam:  Presentation  General Appearance:  Neat; Well Groomed  Eye Contact: Good  Speech: Clear and Coherent; Normal Rate  Speech Volume: Normal  Handedness: Right   Mood and Affect  Mood: Anxious  Affect: Flat   Thought Process  Thought Processes: Disorganized  Descriptions of Associations:Loose  Orientation:Full (Time, Place and Person)  Thought Content:WDL  History of Schizophrenia/Schizoaffective disorder:No  Duration of Psychotic Symptoms:Greater than six months  Hallucinations:No data recorded Ideas of Reference:None  Suicidal Thoughts:No data recorded Homicidal Thoughts:No data recorded  Sensorium  Memory: Immediate Fair; Remote Fair  Judgment: Poor  Insight: Poor   Executive Functions  Concentration: Good  Attention Span: Poor  Recall: Fair  Fund of Knowledge: Good  Language: Good   Psychomotor Activity  Psychomotor Activity:No data recorded  Assets  Assets: Communication Skills; Housing   Sleep  Sleep:No data recorded   Physical Exam: Physical Exam Vitals and nursing note reviewed.  Constitutional:      Appearance: Normal appearance.  HENT:     Head: Normocephalic and atraumatic.     Nose: Nose normal.  Pulmonary:     Effort: Pulmonary effort is normal.  Musculoskeletal:        General: Normal range of motion.     Cervical back: Normal range of  motion.  Neurological:     General: No focal deficit present.     Mental Status: He is alert and oriented to person, place, and time. Mental status is at baseline.  Psychiatric:        Attention and Perception: Attention and perception normal.        Mood and Affect: Mood is anxious. Affect is angry and inappropriate.        Speech: Speech is rapid and pressured.        Behavior: Behavior is agitated and aggressive. Behavior is cooperative.        Thought Content: Thought content normal.        Cognition and Memory: Cognition and memory normal.        Judgment: Judgment is impulsive and inappropriate.    Review of Systems  Psychiatric/Behavioral:  The patient is nervous/anxious.   All other systems reviewed and are negative.  Blood pressure 108/74, pulse 78, temperature 98.1 F (36.7 C), resp. rate 16, height 6' (1.829 m), weight 82.1 kg, SpO2 100%. Body mass index is 24.55 kg/m.   Treatment Plan Summary: Daily contact with patient to assess and evaluate symptoms and progress in treatment and Medication management Risperidone (Risperdal) 1 mg BH-q8a4p (Oral): Atypical antipsychotic for mood stabilization and psychosis management. OLANZapine (Zyprexa) 10 mg Q6H PRN (Oral): For acute agitation or psychosis. Collaborate with LCSW to address housing issues and facilitate communication with the leasing office. Explore options for temporary housing or resources if access to the apartment cannot be resolved promptly. Clonazepam (Klonopin) 0.5 mg BID (Oral): For anxiety and mood stabilization. Myriam Forehand, NP 05/15/2023, 6:00 PM

## 2023-05-15 NOTE — Plan of Care (Signed)
  Problem: Education: Goal: Knowledge of [facility] Education information/materials will improve Outcome: Progressing Goal: Emotional status will improve Outcome: Progressing Goal: Mental status will improve Outcome: Progressing Goal: Verbalization of understanding the information provided will improve Outcome: Progressing   Problem: Activity: Goal: Interest or engagement in activities will improve Outcome: Progressing Goal: Sleeping patterns will improve Outcome: Progressing   Problem: Coping: Goal: Ability to verbalize frustrations and anger appropriately will improve Outcome: Progressing Goal: Ability to demonstrate self-control will improve Outcome: Progressing   Problem: Health Behavior/Discharge Planning: Goal: Identification of resources available to assist in meeting health care needs will improve Outcome: Progressing Goal: Compliance with treatment plan for underlying cause of condition will improve Outcome: Progressing   Problem: Physical Regulation: Goal: Ability to maintain clinical measurements within normal limits will improve Outcome: Progressing   Problem: Safety: Goal: Periods of time without injury will increase Outcome: Progressing   Problem: Activity: Goal: Will verbalize the importance of balancing activity with adequate rest periods Outcome: Progressing   Problem: Coping: Goal: Coping ability will improve Outcome: Progressing Goal: Will verbalize feelings Outcome: Progressing   Problem: Role Relationship: Goal: Ability to communicate needs accurately will improve Outcome: Progressing   Problem: Education: Goal: Knowledge of disease or condition will improve Outcome: Progressing Goal: Understanding of discharge needs will improve Outcome: Progressing   Problem: Physical Regulation: Goal: Complications related to the disease process, condition or treatment will be avoided or minimized Outcome: Progressing   Problem:  Activity: Goal: Will verbalize the importance of balancing activity with adequate rest periods Outcome: Progressing   Problem: Education: Goal: Will be free of psychotic symptoms Outcome: Progressing Goal: Knowledge of the prescribed therapeutic regimen will improve Outcome: Progressing   Problem: Coping: Goal: Coping ability will improve Outcome: Progressing Goal: Will verbalize feelings Outcome: Progressing   Problem: Health Behavior/Discharge Planning: Goal: Compliance with prescribed medication regimen will improve Outcome: Progressing   Problem: Nutritional: Goal: Ability to achieve adequate nutritional intake will improve Outcome: Progressing   Problem: Role Relationship: Goal: Ability to communicate needs accurately will improve Outcome: Progressing Goal: Ability to interact with others will improve Outcome: Progressing   Problem: Safety: Goal: Ability to redirect hostility and anger into socially appropriate behaviors will improve Outcome: Progressing Goal: Ability to remain free from injury will improve Outcome: Progressing   Problem: Self-Care: Goal: Ability to participate in self-care as condition permits will improve Outcome: Progressing   Problem: Self-Concept: Goal: Will verbalize positive feelings about self Outcome: Progressing   Problem: Education: Goal: Ability to make informed decisions regarding treatment will improve Outcome: Progressing   Problem: Coping: Goal: Coping ability will improve Outcome: Progressing   Problem: Health Behavior/Discharge Planning: Goal: Identification of resources available to assist in meeting health care needs will improve Outcome: Progressing   Problem: Medication: Goal: Compliance with prescribed medication regimen will improve Outcome: Progressing   Problem: Self-Concept: Goal: Ability to disclose and discuss suicidal ideas will improve Outcome: Progressing Goal: Will verbalize positive feelings about  self Outcome: Progressing Note: Patient is on track. Patient will work on increased adherence

## 2023-05-15 NOTE — Group Note (Signed)
Date:  05/15/2023 Time:  5:24 PM  Group Topic/Focus:  Crisis Planning:   The purpose of this group is to help patients create a crisis plan for use upon discharge or in the future, as needed.    Participation Level:  Did Not Attend   Mary Sella Ceazia Harb 05/15/2023, 5:24 PM

## 2023-05-15 NOTE — [facility] Counselor (Addendum)
ADDENDUM CSW received return call from [place], she reports that per Anthony Luna, CSW should contact Anthony Luna.  CSW reiterated that Anthony Luna has been reached out to, however, did not answer the phone.  CSW asked if there was another number available for Anthony Luna.  Anthony Luna reports that she does not have any numbers to provide, however, can attempt to assist in coordinating conversation.  She recommends this Clinical research associate text Anthony Luna.  She reports that Anthony Luna is the only team members with key access.    CSW sent Anthony Luna a text.   Anthony Luna called this Clinical research associate back stating that he was off work and out of town and that CSW would need to [employer] for a key. He reports that he has both the front door and apartment key, however, [place] [facility] only has the apartment key.  CSW asked if [place] was open as CSW has attempted to contact several times, he reports "I don't know".    He states several times "this is the day after Thanksgiving".  CSW pointed out that the patient is being discharged and discharges occur daily.  CSW did call [place] [facility] again and left another HIPAA compliant voicemail.  Anthony Luna, MSW, LCSW 05/15/2023 11:24 AM     CSW contacted the patient's sister, Anthony Luna, [phone].  Number was provided by [facility]  Per sister patient does not have a key to his home. She reports that Anthony Luna changed the locks to the patient's home and he would have the key.  She recommended CSW contact Anthony Luna.   CSW notes that CSW has attempted Anthony Luna and is waiting a call back.  Anthony Luna provided the name and number for Anthony Luna, the patient's care manager, [phone].  Anthony Luna reports that she is off work and only answered as a Research officer, political party.  She reports that CSW needs to contact Anthony Luna, the manager for the patient's team.  She reports that she does not have Anthony Luna's number to provide to this Clinical research associate,  however, will call Anthony Luna herself and ask Anthony Luna to call this CSW.   CSW waiting on call from  [place].   At this time, discharge remains pending until confirmation that patient can get into his home.  Anthony Luna, MSW, LCSW 05/15/2023 10:56 AM

## 2023-05-16 DIAGNOSIS — F201 Disorganized schizophrenia: Secondary | ICD-10-CM | POA: Diagnosis not present

## 2023-05-16 NOTE — Group Note (Signed)
Date:  05/16/2023 Time:  4:15 AM  Group Topic/Focus:  Wrap-Up Group:   The focus of this group is to help patients review their daily goal of treatment and discuss progress on daily workbooks.    Participation Level:  Active  Participation Quality:  Appropriate and Resistant  Affect:  Blunted and Defensive  Cognitive:  Alert, Delusional, and Lacking  Insight: Limited  Engagement in Group:  Resistant  Modes of Intervention:  Discussion  Additional Comments:     Maglione,Krystan Northrop E 05/16/2023, 4:15 AM

## 2023-05-16 NOTE — Group Note (Signed)
Date:  05/16/2023 Time:  11:11 AM  Group Topic/Focus:  Goals Group:   The focus of this group is to help patients establish daily goals to achieve during treatment and discuss how the patient can incorporate goal setting into their daily lives to aide in recovery.    Participation Level:  Active  Participation Quality:  Inattentive  Affect:   OFF TOPIC  Cognitive:  Lacking  Insight: Limited  Engagement in Group:  Engaged  Modes of Intervention:  Socialization  Additional Comments:   Slayden Mennenga 05/16/2023, 11:11 AM

## 2023-05-16 NOTE — BH IP Treatment Plan (Signed)
Interdisciplinary Treatment and Diagnostic Plan Update  05/16/2023 Time of Session: 10:22 am Anthony Luna MRN: [id number]  Principal Diagnosis: Schizophrenia [facility])  Secondary Diagnoses: Principal Problem:   Schizophrenia (HCC)   Current Medications:  Current Facility-Administered Medications  Medication Dose Route Frequency Provider Last Rate Last Admin   acetaminophen (TYLENOL) tablet 650 mg  650 mg Oral Q6H PRN Jearld Lesch, NP       alum & mag hydroxide-simeth (MAALOX/MYLANTA) 200-200-20 MG/5ML suspension 30 mL  30 mL Oral Q4H PRN Durwin Nora, Rashaun M, NP       benztropine (COGENTIN) tablet 1 mg  1 mg Oral BID Durwin Nora, Rashaun M, NP   1 mg at 05/16/23 4098   buPROPion (WELLBUTRIN XL) 24 hr tablet 150 mg  150 mg Oral Daily Lerry Liner M, NP   150 mg at 05/16/23 1191   clonazePAM (KLONOPIN) tablet 0.5 mg  0.5 mg Oral BID Lerry Liner M, NP   0.5 mg at 05/16/23 4782   haloperidol lactate (HALDOL) injection 5 mg  5 mg Intramuscular TID PRN Jearld Lesch, NP       And   diphenhydrAMINE (BENADRYL) injection 50 mg  50 mg Intramuscular TID PRN Jearld Lesch, NP       And   LORazepam (ATIVAN) injection 2 mg  2 mg Intramuscular TID PRN Jearld Lesch, NP       hydrOXYzine (ATARAX) tablet 25 mg  25 mg Oral TID PRN Jearld Lesch, NP   25 mg at 05/15/23 1556   magnesium hydroxide (MILK OF MAGNESIA) suspension 30 mL  30 mL Oral Daily PRN Jearld Lesch, NP       melatonin tablet 5 mg  5 mg Oral QHS Dixon, Rashaun M, NP   5 mg at 05/14/23 2136   montelukast (SINGULAIR) tablet 10 mg  10 mg Oral QHS Jearld Lesch, NP   10 mg at 05/14/23 2136   nicotine (NICODERM CQ - dosed in mg/24 hours) patch 14 mg  14 mg Transdermal Daily Sarina Ill, DO   14 mg at 05/16/23 0824   OLANZapine (ZYPREXA) tablet 10 mg  10 mg Oral Q6H PRN Sarina Ill, DO   10 mg at 05/15/23 1556   propranolol (INDERAL) tablet 20 mg  20 mg Oral BID Lerry Liner M, NP   20 mg at  05/16/23 0824   risperiDONE (RISPERDAL) tablet 1 mg  1 mg Oral [id number] Sarina Ill, DO   1 mg at 05/16/23 9562   traZODone (DESYREL) tablet 50 mg  50 mg Oral QHS PRN Jearld Lesch, NP   50 mg at 05/14/23 2136   [START ON 05/18/2023] Vitamin D (Ergocalciferol) (DRISDOL) 1.25 MG (50000 UNIT) capsule 50,000 Units  50,000 Units Oral Q7 days Jearld Lesch, NP       PTA Medications: Medications Prior to Admission  Medication Sig Dispense Refill Last Dose   albuterol (VENTOLIN HFA) 108 (90 Base) MCG/ACT inhaler Inhale 1-2 puffs into the lungs every 6 (six) hours as needed for shortness of breath. 18 g 1    benztropine (COGENTIN) 0.5 MG tablet Take 2 tablets (1 mg total) by mouth 2 (two) times daily. 60 tablet 1    buPROPion (WELLBUTRIN XL) 150 MG 24 hr tablet Take 1 tablet (150 mg total) by mouth daily. 30 tablet 1    cetirizine (ZYRTEC) 10 MG tablet Take 1 tablet (10 mg total) by mouth daily. 30 tablet 1    clonazePAM (  KLONOPIN) 0.5 MG tablet Take 1 tablet (0.5 mg total) by mouth 2 (two) times daily. 60 tablet 1    cloZAPine (CLOZARIL) 100 MG tablet Take 300 mg by mouth at bedtime. Take along with one 50 mg tablet for total 350 mg at bedtime      cloZAPine (CLOZARIL) 50 MG tablet Take 7 tablets (350 mg total) by mouth at bedtime. (Patient not taking: Reported on 04/27/2023) 210 tablet 0    docusate sodium (COLACE) 100 MG capsule Take 1 capsule (100 mg total) by mouth 2 (two) times daily. 60 capsule 1    fluticasone (FLONASE) 50 MCG/ACT nasal spray Place 2 sprays into both nostrils daily as needed for allergies or rhinitis. 16 g 1    hydrOXYzine (ATARAX) 25 MG tablet Take 1 tablet (25 mg total) by mouth 3 (three) times daily as needed for anxiety. 30 tablet 1    melatonin 5 MG TABS Take 1 tablet (5 mg total) by mouth at bedtime. (Patient taking differently: Take 3 mg by mouth at bedtime.) 30 tablet 1    montelukast (SINGULAIR) 10 MG tablet Take 1 tablet (10 mg total) by mouth at  bedtime. 30 tablet 1    nicotine (NICODERM CQ - DOSED IN MG/24 HOURS) 21 mg/24hr patch Place 1 patch (21 mg total) onto the skin daily. 28 patch 1    Paliperidone Palmitate (INVEGA TRINZA) 819 MG/2.625ML SUSP Inject 819 mg into the muscle.      propranolol (INDERAL) 20 MG tablet Take 1 tablet (20 mg total) by mouth 2 (two) times daily. 60 tablet 1    Vitamin D, Ergocalciferol, (DRISDOL) 1.25 MG (50000 UNIT) CAPS capsule Take 1 capsule (50,000 Units total) by mouth every 7 (seven) days. (Patient not taking: Reported on 04/27/2023) 5 capsule 1     Patient Stressors: Financial difficulties   Substance abuse   Other: Housing     Patient Strengths: Motivation for treatment/growth   Treatment Modalities: Medication Management, Group therapy, Case management,  1 to 1 session with clinician, Psychoeducation, Recreational therapy.   Physician Treatment Plan for Primary Diagnosis: Schizophrenia (HCC) Long Term Goal(s): Improvement in symptoms so as ready for discharge   Short Term Goals: Ability to identify changes in lifestyle to reduce recurrence of condition will improve Ability to verbalize feelings will improve Ability to disclose and discuss suicidal ideas Ability to demonstrate self-control will improve Ability to identify and develop effective coping behaviors will improve Ability to maintain clinical measurements within normal limits will improve Compliance with prescribed medications will improve Ability to identify triggers associated with substance abuse/mental health issues will improve  Medication Management: Evaluate patient's response, side effects, and tolerance of medication regimen.  Therapeutic Interventions: 1 to 1 sessions, Unit Group sessions and Medication administration.  Evaluation of Outcomes: Not Progressing  Physician Treatment Plan for Secondary Diagnosis: Principal Problem:   Schizophrenia (HCC)  Long Term Goal(s): Improvement in symptoms so as ready for  discharge   Short Term Goals: Ability to identify changes in lifestyle to reduce recurrence of condition will improve Ability to verbalize feelings will improve Ability to disclose and discuss suicidal ideas Ability to demonstrate self-control will improve Ability to identify and develop effective coping behaviors will improve Ability to maintain clinical measurements within normal limits will improve Compliance with prescribed medications will improve Ability to identify triggers associated with substance abuse/mental health issues will improve     Medication Management: Evaluate patient's response, side effects, and tolerance of medication regimen.  Therapeutic Interventions: 1  to 1 sessions, Unit Group sessions and Medication administration.  Evaluation of Outcomes: Not Progressing   RN Treatment Plan for Primary Diagnosis: Schizophrenia (HCC) Long Term Goal(s): Knowledge of disease and therapeutic regimen to maintain health will improve  Short Term Goals: Ability to remain free from injury will improve, Ability to verbalize frustration and anger appropriately will improve, Ability to demonstrate self-control, Ability to participate in decision making will improve, Ability to verbalize feelings will improve, Ability to disclose and discuss suicidal ideas, Ability to identify and develop effective coping behaviors will improve, and Compliance with prescribed medications will improve  Medication Management: RN will administer medications as ordered by provider, will assess and evaluate patient's response and provide education to patient for prescribed medication. RN will report any adverse and/or side effects to prescribing provider.  Therapeutic Interventions: 1 on 1 counseling sessions, Psychoeducation, Medication administration, Evaluate responses to treatment, Monitor vital signs and CBGs as ordered, Perform/monitor CIWA, COWS, AIMS and Fall Risk screenings as ordered, Perform wound  care treatments as ordered.  Evaluation of Outcomes: Not Progressing   LCSW Treatment Plan for Primary Diagnosis: Schizophrenia (HCC) Long Term Goal(s): Safe transition to appropriate next level of care at discharge, Engage patient in therapeutic group addressing interpersonal concerns.  Short Term Goals: Engage patient in aftercare planning with referrals and resources, Increase social support, Increase ability to appropriately verbalize feelings, Increase emotional regulation, Facilitate acceptance of mental health diagnosis and concerns, Facilitate patient progression through stages of change regarding substance use diagnoses and concerns, Identify triggers associated with mental health/substance abuse issues, and Increase skills for wellness and recovery  Therapeutic Interventions: Assess for all discharge needs, 1 to 1 time with Social worker, Explore available resources and support systems, Assess for adequacy in community support network, Educate family and significant other(s) on suicide prevention, Complete Psychosocial Assessment, Interpersonal group therapy.  Evaluation of Outcomes: Not Progressing   Progress in Treatment: Attending groups: Yes. Participating in groups: No. Taking medication as prescribed: Yes. and No. Toleration medication: Yes. Family/Significant other contact made: No, will contact:  once CSW is able to reach someone. Patient understands diagnosis: Yes. and No.  Discussing patient identified problems/goals with staff: Yes. Medical problems stabilized or resolved: Yes. Denies suicidal/homicidal ideation: Yes. Issues/concerns per patient self-inventory: No. Other: None  New problem(s) identified: No, Describe:  none  Update 05/16/23: No changes at this time   New Short Term/Long Term Goal(s):  detox, elimination of symptoms of psychosis, medication management for mood stabilization; elimination of SI thoughts; development of comprehensive mental  wellness/sobriety plan.  Update 05/16/23: No changes at this time   Patient Goals:  "never give up and be a better person"  Update 05/16/23: No changes at this time   Discharge Plan or Barriers: CSW to assist in the development of appropriate discharge plans.  Update 05/16/23: The patient has been aggressive lately in regards to going home. The patient was not able to leave because his CST team hs his keys to his home and they want be back in town until Monday.    Reason for Continuation of Hospitalization: Anxiety Depression Medical Issues Medication stabilization Suicidal ideation   Estimated Length of Stay:  1-7 days Update 05/16/23: No changes at this time  Last 3 [place] Suicide Severity Risk Score: Flowsheet Row Admission (Current) from 05/10/2023 in [facility] ED from 05/09/2023 in [facility] Department at [facility] ED from 04/27/2023 in [facility] Emergency Department at [facility]  C-SSRS RISK CATEGORY High  Risk High Risk High Risk       Last PHQ 2/9 Scores:     No data to display          Scribe for Treatment Team: Marshell Levan, LCSW 05/16/2023 10:21 AM

## 2023-05-16 NOTE — Group Note (Signed)
Date:  05/16/2023 Time:  5:20 PM  Group Topic/Focus:  Rediscovering Joy:   The focus of this group is to explore various ways to relieve stress in a positive manner.    Participation Level:  Minimal  Participation Quality:  Appropriate  Affect:  Appropriate  Cognitive:  Appropriate  Insight: Appropriate  Engagement in Group:  Engaged  Modes of Intervention:  Socialization  Additional Comments:    Wilford Corner 05/16/2023, 5:20 PM

## 2023-05-16 NOTE — Progress Notes (Signed)
   05/15/23 2100  Psych Admission Type (Psych Patients Only)  Admission Status Voluntary  Psychosocial Assessment  Patient Complaints None  Eye Contact Intense;Suspiciousness;Staring;Watchful  Facial Expression Flat  Affect Preoccupied  Speech Loud  Interaction Assertive;Dominating;Demanding;Hostile  Motor Activity Slow;Pacing  Appearance/Hygiene Improved  Behavior Characteristics Cooperative;Appropriate to situation  Mood Preoccupied  Thought Process  Coherency Tangential;Disorganized  Content Preoccupation  Delusions None reported or observed  Perception Derealization  Hallucination None reported or observed  Judgment Impaired  Confusion Mild  Danger to Self  Current suicidal ideation? Denies  Agreement Not to Harm Self Yes  Description of Agreement verbal  Danger to Others  Danger to Others None reported or observed   Patient appears restless, responding to internal stimuli not receptive to staff, demanding and hostile. Patient's thoughts ae disorganized, he denies SI/HI/AVH 15 minutes safety checks maintained.

## 2023-05-16 NOTE — Group Note (Signed)
Date:  05/16/2023 Time:  8:19 PM  Group Topic/Focus:  Wrap-Up Group:   The focus of this group is to help patients review their daily goal of treatment and discuss progress on daily workbooks.    Participation Level:  Active  Participation Quality:  Appropriate and Attentive  Affect:  Appropriate  Cognitive:  Alert and Appropriate  Insight: Appropriate  Engagement in Group:  Engaged  Modes of Intervention:  Discussion  Additional Comments:     Maglione,Aisea Bouldin E 05/16/2023, 8:19 PM

## 2023-05-17 DIAGNOSIS — F201 Disorganized schizophrenia: Secondary | ICD-10-CM | POA: Diagnosis not present

## 2023-05-17 NOTE — Group Note (Signed)
Date:  05/17/2023 Time:  10:25 AM  Group Topic/Focus:  Spirituality:   The focus of this group is to discuss how one's spirituality can aide in recovery.    Participation Level:  Active  Participation Quality:  Appropriate  Affect:  Appropriate  Cognitive:  Appropriate  Insight: Appropriate  Engagement in Group:  Engaged  Modes of Intervention:  Activity  Additional Comments:    Anthony Luna 05/17/2023, 10:25 AM

## 2023-05-17 NOTE — Plan of Care (Signed)

## 2023-05-17 NOTE — Progress Notes (Signed)
   05/16/23 2000  Psych Admission Type (Psych Patients Only)  Admission Status Voluntary  Psychosocial Assessment  Patient Complaints None  Eye Contact Intense;Suspiciousness;Staring;Watchful  Facial Expression Flat  Affect Preoccupied  Speech Loud  Interaction Assertive;Dominating;Demanding;Hostile  Motor Activity Slow;Pacing  Appearance/Hygiene Improved  Thought Process  Coherency Tangential;Disorganized  Content Preoccupation  Delusions None reported or observed  Perception Derealization  Hallucination None reported or observed  Judgment Impaired  Confusion Mild  Danger to Self  Current suicidal ideation? Denies  Agreement Not to Harm Self Yes  Description of Agreement verbal  Danger to Others  Danger to Others None reported or observed   Patient appears boisterous, argumentative and hostile to staff members. Patient was frequently redirected for safety,mshe denies SI/HI/AVH

## 2023-05-17 NOTE — Group Note (Deleted)
Date:  05/17/2023 Time:  3:00 PM  Group Topic/Focus:  Healthy Communication:   The focus of this group is to discuss communication, barriers to communication, as well as healthy ways to communicate with others. MoviesTherapy encourages emotional release Even those who often have trouble expressing their emotions might find themselves laughing or crying during a film. This release of emotions can have a cathartic effect and also make it easier for a person to become more comfortable in expressing their emotions.     Participation Level:  {BHH PARTICIPATION ZHYQM:57846}  Participation Quality:  {BHH PARTICIPATION QUALITY:22265}  Affect:  {BHH AFFECT:22266}  Cognitive:  {BHH COGNITIVE:22267}  Insight: {BHH Insight2:20797}  Engagement in Group:  {BHH ENGAGEMENT IN GROUP:22268}  Modes of Intervention:  {BHH MODES OF INTERVENTION:22269}  Additional Comments:  ***  Anthony Luna 05/17/2023, 3:00 PM

## 2023-05-17 NOTE — Group Note (Signed)
Date:  05/17/2023 Time:  3:18 PM  Group Topic/Focus:  Activity Group:  The focus of the group is to promote activity for the patients and encourage them to come outside in the courtyard and get some fresh air and some exercise.    Participation Level:  Active  Participation Quality:  Appropriate  Affect:  Appropriate  Cognitive:  Appropriate  Insight: Appropriate  Engagement in Group:  Engaged  Modes of Intervention:  Activity  Additional Comments:    Mary Sella Kaela Beitz 05/17/2023, 3:18 PM

## 2023-05-17 NOTE — Plan of Care (Incomplete)
  Problem: Coping: Goal: Ability to verbalize frustrations and anger appropriately will improve Outcome: Progressing   Problem: Activity: Goal: Sleeping patterns will improve Outcome: Progressing   Problem: Activity: Goal: Interest or engagement in activities will improve Outcome: Progressing   Problem: Activity: Goal: Sleeping patterns will improve Outcome: Progressing

## 2023-05-17 NOTE — Group Note (Signed)
Date:  05/17/2023 Time:  9:37 PM  Group Topic/Focus:  Wrap-Up Group:   The focus of this group is to help patients review their daily goal of treatment and discuss progress on daily workbooks.    Participation Level:  Active  Participation Quality:  Appropriate, Attentive, Sharing, and Supportive  Affect:  Appropriate  Cognitive:  Appropriate  Insight: Appropriate and Good  Engagement in Group:  Supportive  Modes of Intervention:  Support  Additional Comments:     Belva Crome 05/17/2023, 9:37 PM

## 2023-05-17 NOTE — Progress Notes (Signed)
D: Patient alert and oriented, able to make needs known. Denies SI/HI, reports he is still having auditory hallucinations, but they are better than at admission. He is responding to internal stimuli.  Denies pain at present. Patient goal today "to have a good day." Rates depression 1/10, hopelessness 0/10, and anxiety 2/10. Patient reports energy level as normal. He reports he slept well last night but staff report he did not sleep well. Patient does not request any PRN medication at this time.   A: Scheduled medications administered to patient per MD order. Support and encouragement provided. Routine safety checks conducted every fifteen minutes. Patient informed to notify staff with problems or concerns. Frequent verbal contact made.   R: No adverse drug reactions noted. Patient contracts for safety at this time. Patient is compliant with medications and treatment plan. Patient receptive, calm and cooperative. Patient interacts with others appropriately on unit at present. Patient remains safe at present.

## 2023-05-18 DIAGNOSIS — F201 Disorganized schizophrenia: Secondary | ICD-10-CM | POA: Diagnosis not present

## 2023-05-18 MED ORDER — RISPERIDONE 1 MG PO TABS: 1.0000 mg | ORAL_TABLET | ORAL | 0 refills | Status: DC

## 2023-05-18 MED ORDER — OLANZAPINE 10 MG PO TABS: 10.0000 mg | ORAL_TABLET | Freq: Four times a day (QID) | ORAL | 0 refills | Status: AC | PRN

## 2023-05-18 MED ORDER — TRAZODONE HCL 50 MG PO TABS: 50.0000 mg | ORAL_TABLET | Freq: Every evening | ORAL | 0 refills | Status: DC | PRN

## 2023-05-18 MED ORDER — NICOTINE 14 MG/24HR TD PT24: 14.0000 mg | MEDICATED_PATCH | Freq: Every day | TRANSDERMAL | 0 refills | Status: DC

## 2023-05-18 MED ORDER — PROPRANOLOL HCL 20 MG PO TABS: 20.0000 mg | ORAL_TABLET | Freq: Two times a day (BID) | ORAL | 0 refills | Status: DC

## 2023-05-18 MED ORDER — NICOTINE 14 MG/24HR TD PT24: 14.0000 mg | MEDICATED_PATCH | Freq: Every day | TRANSDERMAL | 0 refills | Status: AC

## 2023-05-18 MED ORDER — VITAMIN D (ERGOCALCIFEROL) 1.25 MG (50000 UNIT) PO CAPS: 50000.0000 [IU] | ORAL_CAPSULE | ORAL | 0 refills | Status: DC

## 2023-05-18 NOTE — Progress Notes (Signed)
Discharge Note:  Patient denies SI/HI/AVH at this time. Discharge instructions, AVS, prescriptions, and transition record reviewed with patient. Patient agrees to comply with medication management, follow-up visit, and outpatient therapy. Patient belongings returned to patient. Patient questions and concerns addressed and answered. Patient ambulatory off unit. Patient discharged to home, he left via taxi,

## 2023-05-18 NOTE — Progress Notes (Signed)
[facility] MD Progress Note  05/16/2023 12:44 PM Anthony Luna  MRN:  [id number] Subjective:   37 year old African American male who appears withdrawn and visibly upset about not being discharged as planned. He has been up all night pacing and expresses frustration regarding the delay in his discharge. He denies suicidal ideation (SI), homicidal ideation (HI), or auditory/visual hallucinations (AVH).No signs of psychosis, self-harm, or aggressive behavior noted.The patient is experiencing emotional distress related to the delay in discharge. Principal Problem: Schizophrenia (HCC) Diagnosis: Principal Problem:   Schizophrenia (HCC) Active Problems:   Cannabis use disorder, severe, dependence (HCC)  Total Time spent with patient: 1 hour  Past Psychiatric History: Depression  Past Medical History:  Past Medical History:  Diagnosis Date   Depression    Since moving into new apartment 1 year ago   None to low serum cortisol response with adrenocorticotrophic hormone (ACTH) stimulation test    Schizophrenia, paranoid type (HCC)    History reviewed. No pertinent surgical history. Family History: History reviewed. No pertinent family history. Family Psychiatric  History: none reported Social History:  Social History   Substance and Sexual Activity  Alcohol Use Yes   Alcohol/week: 0.0 standard drinks of alcohol   Comment: "Special occasions"     Social History   Substance and Sexual Activity  Drug Use Yes   Frequency: 4.0 times per week   Types: Marijuana   Comment: "2 blunts"    Social History   Socioeconomic History   Marital status: Single    Spouse name: Not on file   Number of children: Not on file   Years of education: Not on file   Highest education level: Not on file  Occupational History   Not on file  Tobacco Use   Smoking status: Every Day    Current packs/day: 0.50    Average packs/day: 0.5 packs/day for 20.5 years (10.2 ttl pk-yrs)    Types: Cigarettes     Start date: 11/29/2002   Smokeless tobacco: Former   Tobacco comments:    does not want to quit  Vaping Use   Vaping status: Never Used  Substance and Sexual Activity   Alcohol use: Yes    Alcohol/week: 0.0 standard drinks of alcohol    Comment: "Special occasions"   Drug use: Yes    Frequency: 4.0 times per week    Types: Marijuana    Comment: "2 blunts"   Sexual activity: Not on file  Other Topics Concern   Not on file  Social History Narrative   Not on file   Social Determinants of Health   Financial Resource Strain: Not on file  Food Insecurity: Food Insecurity Present (05/10/2023)   Hunger Vital Sign    Worried About Running Out of Food in the Last Year: Sometimes true    Ran Out of Food in the Last Year: Often true  Transportation Needs: Unmet Transportation Needs (05/10/2023)   PRAPARE - Administrator, Civil Service (Medical): Yes    Lack of Transportation (Non-Medical): Yes  Physical Activity: Not on file  Stress: Not on file  Social Connections: Not on file   Additional Social History:  Specify valuables returned: clothing                      Sleep: Poor  Appetite:  Good  Current Medications: Current Facility-Administered Medications  Medication Dose Route Frequency Provider Last Rate Last Admin   acetaminophen (TYLENOL) tablet 650 mg  650 mg  Oral Q6H PRN Jearld Lesch, NP       alum & mag hydroxide-simeth (MAALOX/MYLANTA) 200-200-20 MG/5ML suspension 30 mL  30 mL Oral Q4H PRN Jearld Lesch, NP   30 mL at 05/16/23 1611   benztropine (COGENTIN) tablet 1 mg  1 mg Oral BID Jearld Lesch, NP   1 mg at 05/18/23 0849   buPROPion (WELLBUTRIN XL) 24 hr tablet 150 mg  150 mg Oral Daily Lerry Liner M, NP   150 mg at 05/18/23 0848   clonazePAM (KLONOPIN) tablet 0.5 mg  0.5 mg Oral BID Lerry Liner M, NP   0.5 mg at 05/18/23 0849   haloperidol lactate (HALDOL) injection 5 mg  5 mg Intramuscular TID PRN Jearld Lesch, NP       And    diphenhydrAMINE (BENADRYL) injection 50 mg  50 mg Intramuscular TID PRN Jearld Lesch, NP       And   LORazepam (ATIVAN) injection 2 mg  2 mg Intramuscular TID PRN Jearld Lesch, NP       hydrOXYzine (ATARAX) tablet 25 mg  25 mg Oral TID PRN Jearld Lesch, NP   25 mg at 05/15/23 1556   magnesium hydroxide (MILK OF MAGNESIA) suspension 30 mL  30 mL Oral Daily PRN Jearld Lesch, NP       melatonin tablet 5 mg  5 mg Oral QHS Dixon, Rashaun M, NP   5 mg at 05/17/23 2124   montelukast (SINGULAIR) tablet 10 mg  10 mg Oral QHS Dixon, Rashaun M, NP   10 mg at 05/17/23 2124   nicotine (NICODERM CQ - dosed in mg/24 hours) patch 14 mg  14 mg Transdermal Daily Sarina Ill, DO   14 mg at 05/18/23 0848   OLANZapine (ZYPREXA) tablet 10 mg  10 mg Oral Q6H PRN Sarina Ill, DO   10 mg at 05/15/23 1556   propranolol (INDERAL) tablet 20 mg  20 mg Oral BID Lerry Liner M, NP   20 mg at 05/18/23 0848   risperiDONE (RISPERDAL) tablet 1 mg  1 mg Oral BH-q8a4p Sarina Ill, DO   1 mg at 05/18/23 6213   traZODone (DESYREL) tablet 50 mg  50 mg Oral QHS PRN Jearld Lesch, NP   50 mg at 05/17/23 2124   Vitamin D (Ergocalciferol) (DRISDOL) 1.25 MG (50000 UNIT) capsule 50,000 Units  50,000 Units Oral Q7 days Jearld Lesch, NP       Current Outpatient Medications  Medication Sig Dispense Refill   albuterol (VENTOLIN HFA) 108 (90 Base) MCG/ACT inhaler Inhale 1-2 puffs into the lungs every 6 (six) hours as needed for shortness of breath. 18 g 1   benztropine (COGENTIN) 0.5 MG tablet Take 2 tablets (1 mg total) by mouth 2 (two) times daily. 60 tablet 1   buPROPion (WELLBUTRIN XL) 150 MG 24 hr tablet Take 1 tablet (150 mg total) by mouth daily. 30 tablet 1   cetirizine (ZYRTEC) 10 MG tablet Take 1 tablet (10 mg total) by mouth daily. 30 tablet 1   clonazePAM (KLONOPIN) 0.5 MG tablet Take 1 tablet (0.5 mg total) by mouth 2 (two) times daily. 60 tablet 1   cloZAPine  (CLOZARIL) 100 MG tablet Take 300 mg by mouth at bedtime. Take along with one 50 mg tablet for total 350 mg at bedtime     cloZAPine (CLOZARIL) 50 MG tablet Take 7 tablets (350 mg total) by mouth at bedtime. (Patient not taking:  Reported on 04/27/2023) 210 tablet 0   docusate sodium (COLACE) 100 MG capsule Take 1 capsule (100 mg total) by mouth 2 (two) times daily. 60 capsule 1   fluticasone (FLONASE) 50 MCG/ACT nasal spray Place 2 sprays into both nostrils daily as needed for allergies or rhinitis. 16 g 1   hydrOXYzine (ATARAX) 25 MG tablet Take 1 tablet (25 mg total) by mouth 3 (three) times daily as needed for anxiety. 30 tablet 1   melatonin 5 MG TABS Take 1 tablet (5 mg total) by mouth at bedtime. (Patient taking differently: Take 3 mg by mouth at bedtime.) 30 tablet 1   montelukast (SINGULAIR) 10 MG tablet Take 1 tablet (10 mg total) by mouth at bedtime. 30 tablet 1   [START ON 05/19/2023] nicotine (NICODERM CQ - DOSED IN MG/24 HOURS) 14 mg/24hr patch Place 1 patch (14 mg total) onto the skin daily for 28 days. 28 patch 0   nicotine (NICODERM CQ - DOSED IN MG/24 HOURS) 21 mg/24hr patch Place 1 patch (21 mg total) onto the skin daily. 28 patch 1   OLANZapine (ZYPREXA) 10 MG tablet Take 1 tablet (10 mg total) by mouth every 6 (six) hours as needed (Psychosis). 30 tablet 0   Paliperidone Palmitate (INVEGA TRINZA) 819 MG/2.625ML SUSP Inject 819 mg into the muscle.     propranolol (INDERAL) 20 MG tablet Take 1 tablet (20 mg total) by mouth 2 (two) times daily. 60 tablet 0   risperiDONE (RISPERDAL) 1 MG tablet Take 1 tablet (1 mg total) by mouth 2 (two) times daily at 8 am and 4 pm. 60 tablet 0   traZODone (DESYREL) 50 MG tablet Take 1 tablet (50 mg total) by mouth at bedtime as needed for sleep. 30 tablet 0   Vitamin D, Ergocalciferol, (DRISDOL) 1.25 MG (50000 UNIT) CAPS capsule Take 1 capsule (50,000 Units total) by mouth every 7 (seven) days. (Patient not taking: Reported on 04/27/2023) 5 capsule 1    Vitamin D, Ergocalciferol, (DRISDOL) 1.25 MG (50000 UNIT) CAPS capsule Take 1 capsule (50,000 Units total) by mouth every 7 (seven) days for 5 doses. 5 capsule 0    Lab Results: No results found for this or any previous visit (from the past 48 hour(s)).  Blood Alcohol level:  Lab Results  Component Value Date   ETH <10 05/09/2023   ETH <10 04/27/2023    Metabolic Disorder Labs: Lab Results  Component Value Date   HGBA1C 5.5 05/11/2023   MPG 111.15 05/11/2023   MPG 99.67 04/30/2022   Lab Results  Component Value Date   PROLACTIN 6.1 12/19/2014   PROLACTIN 1.3 (L) 11/30/2014   Lab Results  Component Value Date   CHOL 132 05/11/2023   TRIG 33 05/11/2023   HDL 54 05/11/2023   CHOLHDL 2.4 05/11/2023   VLDL 7 05/11/2023   LDLCALC 71 05/11/2023   LDLCALC 127 (H) 04/30/2022    Physical Findings: AIMS:  , ,  ,  ,    CIWA:    COWS:     Musculoskeletal: Strength & Muscle Tone: within normal limits Gait & Station: normal Patient leans: N/A  Psychiatric Specialty Exam:  Presentation  General Appearance:  Appropriate for Environment; Neat  Eye Contact: Minimal  Speech: Clear and Coherent; Normal Rate  Speech Volume: Normal  Handedness: Right   Mood and Affect  Mood: Euthymic  Affect: Appropriate   Thought Process  Thought Processes: Coherent  Descriptions of Associations:Intact  Orientation:Full (Time, Place and Person) (and situation)  Thought Content:WDL  History of Schizophrenia/Schizoaffective disorder:Yes  Duration of Psychotic Symptoms:Greater than six months  Hallucinations:Hallucinations: None Description of Auditory Hallucinations: denies  Ideas of Reference:Other (comment)  Suicidal Thoughts:Suicidal Thoughts: No SI Active Intent and/or Plan: -- (denies)  Homicidal Thoughts:Homicidal Thoughts: No   Sensorium  Memory: Immediate Good; Remote Good  Judgment: Fair  Insight: Fair   Art therapist   Concentration: Good  Attention Span: Good; Fair  Recall: Good  Fund of Knowledge: Good  Language: Good   Psychomotor Activity  Psychomotor Activity:Psychomotor Activity: Normal   Assets  Assets: Housing; Manufacturing systems engineer; Financial Resources/Insurance   Sleep  Sleep:Sleep: Fair Number of Hours of Sleep: 4    Physical Exam: Physical Exam Vitals and nursing note reviewed.  Constitutional:      Appearance: Normal appearance.  HENT:     Head: Normocephalic and atraumatic.     Nose: Nose normal.  Pulmonary:     Effort: Pulmonary effort is normal.  Musculoskeletal:        General: Normal range of motion.     Cervical back: Normal range of motion.  Neurological:     General: No focal deficit present.     Mental Status: He is alert. Mental status is at baseline.  Psychiatric:        Attention and Perception: Attention and perception normal.        Mood and Affect: Mood is anxious. Affect is flat and angry.        Speech: Speech is rapid and pressured.        Behavior: Behavior is aggressive.        Thought Content: Thought content normal.        Cognition and Memory: Cognition and memory normal.        Judgment: Judgment is impulsive.    ROS Blood pressure 110/69, pulse 83, temperature 97.6 F (36.4 C), resp. rate 17, height 6' (1.829 m), weight 82.1 kg, SpO2 100%. Body mass index is 24.55 kg/m.   Treatment Plan Summary: Daily contact with patient to assess and evaluate symptoms and progress in treatment and Medication management Risperidone (Risperdal) 1 mg [id number] (Oral): Atypical antipsychotic for mood stabilization and psychosis management. OLANZapine (Zyprexa) 10 mg Q6H PRN (Oral): For acute agitation or psychosis. Collaborate with LCSW to address housing issues and facilitate communication with the leasing office. Explore options for temporary housing or resources if access to the apartment cannot be resolved promptly. Clonazepam (Klonopin)  0.5 mg BID (Oral): For anxiety and mood stabilizatio Myriam Forehand, NP 05/18/2023, 4:55 PM

## 2023-05-18 NOTE — Progress Notes (Signed)
[facility] MD Progress Note  05/17/23 4:30 PM Rasheen Nicklaus  MRN:  [id number] Subjective:  37 year old African American male who stated, "I can go home, just call Jillyn Hidden." He denies suicidal ideation (SI), homicidal ideation (HI), or auditory/visual hallucinations (AVH). The patient appears anxious and reports readiness for discharge, pending arrangements with his support system.Anxiety noted but manageable with support and a structured discharge plan. Principal Problem: Schizophrenia (HCC) Diagnosis: Principal Problem:   Schizophrenia (HCC) Active Problems:   Cannabis use disorder, severe, dependence (HCC)  Total Time spent with patient: 45 minutes  Past Psychiatric History: Depression Schizophrenia  Past Medical History:  Past Medical History:  Diagnosis Date   Depression    Since moving into new apartment 1 year ago   None to low serum cortisol response with adrenocorticotrophic hormone (ACTH) stimulation test    Schizophrenia, paranoid type (HCC)    History reviewed. No pertinent surgical history. Family History: History reviewed. No pertinent family history. Family Psychiatric  History: none reported Social History:  Social History   Substance and Sexual Activity  Alcohol Use Yes   Alcohol/week: 0.0 standard drinks of alcohol   Comment: "Special occasions"     Social History   Substance and Sexual Activity  Drug Use Yes   Frequency: 4.0 times per week   Types: Marijuana   Comment: "2 blunts"    Social History   Socioeconomic History   Marital status: Single    Spouse name: Not on file   Number of children: Not on file   Years of education: Not on file   Highest education level: Not on file  Occupational History   Not on file  Tobacco Use   Smoking status: Every Day    Current packs/day: 0.50    Average packs/day: 0.5 packs/day for 20.5 years (10.2 ttl pk-yrs)    Types: Cigarettes    Start date: 11/29/2002   Smokeless tobacco: Former   Tobacco comments:     does not want to quit  Vaping Use   Vaping status: Never Used  Substance and Sexual Activity   Alcohol use: Yes    Alcohol/week: 0.0 standard drinks of alcohol    Comment: "Special occasions"   Drug use: Yes    Frequency: 4.0 times per week    Types: Marijuana    Comment: "2 blunts"   Sexual activity: Not on file  Other Topics Concern   Not on file  Social History Narrative   Not on file   Social Determinants of Health   Financial Resource Strain: Not on file  Food Insecurity: Food Insecurity Present (05/10/2023)   Hunger Vital Sign    Worried About Running Out of Food in the Last Year: Sometimes true    Ran Out of Food in the Last Year: Often true  Transportation Needs: Unmet Transportation Needs (05/10/2023)   PRAPARE - Administrator, Civil Service (Medical): Yes    Lack of Transportation (Non-Medical): Yes  Physical Activity: Not on file  Stress: Not on file  Social Connections: Not on file   Additional Social History:  Specify valuables returned: clothing                      Sleep: Fair  Appetite:  Good  Current Medications: Current Facility-Administered Medications  Medication Dose Route Frequency Provider Last Rate Last Admin   acetaminophen (TYLENOL) tablet 650 mg  650 mg Oral Q6H PRN Jearld Lesch, NP  alum & mag hydroxide-simeth (MAALOX/MYLANTA) 200-200-20 MG/5ML suspension 30 mL  30 mL Oral Q4H PRN Durwin Nora, Rashaun M, NP   30 mL at 05/16/23 1611   benztropine (COGENTIN) tablet 1 mg  1 mg Oral BID Jearld Lesch, NP   1 mg at 05/18/23 0849   buPROPion (WELLBUTRIN XL) 24 hr tablet 150 mg  150 mg Oral Daily Lerry Liner M, NP   150 mg at 05/18/23 0848   clonazePAM (KLONOPIN) tablet 0.5 mg  0.5 mg Oral BID Lerry Liner M, NP   0.5 mg at 05/18/23 0849   haloperidol lactate (HALDOL) injection 5 mg  5 mg Intramuscular TID PRN Jearld Lesch, NP       And   diphenhydrAMINE (BENADRYL) injection 50 mg  50 mg Intramuscular TID PRN  Jearld Lesch, NP       And   LORazepam (ATIVAN) injection 2 mg  2 mg Intramuscular TID PRN Jearld Lesch, NP       hydrOXYzine (ATARAX) tablet 25 mg  25 mg Oral TID PRN Jearld Lesch, NP   25 mg at 05/15/23 1556   magnesium hydroxide (MILK OF MAGNESIA) suspension 30 mL  30 mL Oral Daily PRN Jearld Lesch, NP       melatonin tablet 5 mg  5 mg Oral QHS Dixon, Rashaun M, NP   5 mg at 05/17/23 2124   montelukast (SINGULAIR) tablet 10 mg  10 mg Oral QHS Dixon, Rashaun M, NP   10 mg at 05/17/23 2124   nicotine (NICODERM CQ - dosed in mg/24 hours) patch 14 mg  14 mg Transdermal Daily Sarina Ill, DO   14 mg at 05/18/23 0848   OLANZapine (ZYPREXA) tablet 10 mg  10 mg Oral Q6H PRN Sarina Ill, DO   10 mg at 05/15/23 1556   propranolol (INDERAL) tablet 20 mg  20 mg Oral BID Lerry Liner M, NP   20 mg at 05/18/23 0848   risperiDONE (RISPERDAL) tablet 1 mg  1 mg Oral BH-q8a4p Sarina Ill, DO   1 mg at 05/18/23 9147   traZODone (DESYREL) tablet 50 mg  50 mg Oral QHS PRN Jearld Lesch, NP   50 mg at 05/17/23 2124   Vitamin D (Ergocalciferol) (DRISDOL) 1.25 MG (50000 UNIT) capsule 50,000 Units  50,000 Units Oral Q7 days Jearld Lesch, NP       Current Outpatient Medications  Medication Sig Dispense Refill   albuterol (VENTOLIN HFA) 108 (90 Base) MCG/ACT inhaler Inhale 1-2 puffs into the lungs every 6 (six) hours as needed for shortness of breath. 18 g 1   benztropine (COGENTIN) 0.5 MG tablet Take 2 tablets (1 mg total) by mouth 2 (two) times daily. 60 tablet 1   buPROPion (WELLBUTRIN XL) 150 MG 24 hr tablet Take 1 tablet (150 mg total) by mouth daily. 30 tablet 1   cetirizine (ZYRTEC) 10 MG tablet Take 1 tablet (10 mg total) by mouth daily. 30 tablet 1   clonazePAM (KLONOPIN) 0.5 MG tablet Take 1 tablet (0.5 mg total) by mouth 2 (two) times daily. 60 tablet 1   cloZAPine (CLOZARIL) 100 MG tablet Take 300 mg by mouth at bedtime. Take along with one 50 mg  tablet for total 350 mg at bedtime     cloZAPine (CLOZARIL) 50 MG tablet Take 7 tablets (350 mg total) by mouth at bedtime. (Patient not taking: Reported on 04/27/2023) 210 tablet 0   docusate sodium (COLACE) 100 MG  capsule Take 1 capsule (100 mg total) by mouth 2 (two) times daily. 60 capsule 1   fluticasone (FLONASE) 50 MCG/ACT nasal spray Place 2 sprays into both nostrils daily as needed for allergies or rhinitis. 16 g 1   hydrOXYzine (ATARAX) 25 MG tablet Take 1 tablet (25 mg total) by mouth 3 (three) times daily as needed for anxiety. 30 tablet 1   melatonin 5 MG TABS Take 1 tablet (5 mg total) by mouth at bedtime. (Patient taking differently: Take 3 mg by mouth at bedtime.) 30 tablet 1   montelukast (SINGULAIR) 10 MG tablet Take 1 tablet (10 mg total) by mouth at bedtime. 30 tablet 1   [START ON 05/19/2023] nicotine (NICODERM CQ - DOSED IN MG/24 HOURS) 14 mg/24hr patch Place 1 patch (14 mg total) onto the skin daily for 28 days. 28 patch 0   nicotine (NICODERM CQ - DOSED IN MG/24 HOURS) 21 mg/24hr patch Place 1 patch (21 mg total) onto the skin daily. 28 patch 1   OLANZapine (ZYPREXA) 10 MG tablet Take 1 tablet (10 mg total) by mouth every 6 (six) hours as needed (Psychosis). 30 tablet 0   Paliperidone Palmitate (INVEGA TRINZA) 819 MG/2.625ML SUSP Inject 819 mg into the muscle.     propranolol (INDERAL) 20 MG tablet Take 1 tablet (20 mg total) by mouth 2 (two) times daily. 60 tablet 0   risperiDONE (RISPERDAL) 1 MG tablet Take 1 tablet (1 mg total) by mouth 2 (two) times daily at 8 am and 4 pm. 60 tablet 0   traZODone (DESYREL) 50 MG tablet Take 1 tablet (50 mg total) by mouth at bedtime as needed for sleep. 30 tablet 0   Vitamin D, Ergocalciferol, (DRISDOL) 1.25 MG (50000 UNIT) CAPS capsule Take 1 capsule (50,000 Units total) by mouth every 7 (seven) days. (Patient not taking: Reported on 04/27/2023) 5 capsule 1   Vitamin D, Ergocalciferol, (DRISDOL) 1.25 MG (50000 UNIT) CAPS capsule Take 1  capsule (50,000 Units total) by mouth every 7 (seven) days for 5 doses. 5 capsule 0    Lab Results: No results found for this or any previous visit (from the past 48 hour(s)).  Blood Alcohol level:  Lab Results  Component Value Date   ETH <10 05/09/2023   ETH <10 04/27/2023    Metabolic Disorder Labs: Lab Results  Component Value Date   HGBA1C 5.5 05/11/2023   MPG 111.15 05/11/2023   MPG 99.67 04/30/2022   Lab Results  Component Value Date   PROLACTIN 6.1 12/19/2014   PROLACTIN 1.3 (L) 11/30/2014   Lab Results  Component Value Date   CHOL 132 05/11/2023   TRIG 33 05/11/2023   HDL 54 05/11/2023   CHOLHDL 2.4 05/11/2023   VLDL 7 05/11/2023   LDLCALC 71 05/11/2023   LDLCALC 127 (H) 04/30/2022    Physical Findings: AIMS:  , ,  ,  ,    CIWA:    COWS:     Musculoskeletal: Strength & Muscle Tone: within normal limits Gait & Station: normal Patient leans: N/A  Psychiatric Specialty Exam:  Presentation  General Appearance:  Appropriate for Environment; Neat  Eye Contact: Minimal  Speech: Clear and Coherent; Normal Rate  Speech Volume: Normal  Handedness: Right   Mood and Affect  Mood: Euthymic  Affect: Appropriate   Thought Process  Thought Processes: Coherent  Descriptions of Associations:Intact  Orientation:Full (Time, Place and Person) (and situation)  Thought Content:WDL  History of Schizophrenia/Schizoaffective disorder:Yes  Duration of Psychotic Symptoms:Greater than  six months  Hallucinations:Hallucinations: None Description of Auditory Hallucinations: denies  Ideas of Reference:Other (comment)  Suicidal Thoughts:Suicidal Thoughts: No SI Active Intent and/or Plan: -- (denies)  Homicidal Thoughts:Homicidal Thoughts: No   Sensorium  Memory: Immediate Good; Remote Good  Judgment: Fair  Insight: Fair   Art therapist  Concentration: Good  Attention Span: Good; Fair  Recall: Good  Fund of  Knowledge: Good  Language: Good   Psychomotor Activity  Psychomotor Activity:Psychomotor Activity: Normal   Assets  Assets: Housing; Manufacturing systems engineer; Financial Resources/Insurance   Sleep  Sleep:Sleep: Fair Number of Hours of Sleep: 4    Physical Exam: Physical Exam Constitutional:      Appearance: Normal appearance.  HENT:     Head: Normocephalic and atraumatic.     Nose: Nose normal.  Cardiovascular:     Rate and Rhythm: Normal rate.  Pulmonary:     Effort: Pulmonary effort is normal.  Musculoskeletal:        General: Normal range of motion.  Neurological:     General: No focal deficit present.     Mental Status: He is alert and oriented to person, place, and time. Mental status is at baseline.  Psychiatric:        Attention and Perception: Attention and perception normal.        Mood and Affect: Mood is anxious. Affect is flat.        Speech: Speech normal.        Behavior: Behavior normal. Behavior is cooperative.        Thought Content: Thought content normal.        Cognition and Memory: Cognition and memory normal.        Judgment: Judgment is impulsive.    Review of Systems  All other systems reviewed and are negative.  Blood pressure 110/69, pulse 83, temperature 97.6 F (36.4 C), resp. rate 17, height 6' (1.829 m), weight 82.1 kg, SpO2 100%. Body mass index is 24.55 kg/m.   Treatment Plan Summary: Risperidone (Risperdal) 1 mg [id number] (Oral): Atypical antipsychotic for mood stabilization and psychosis management. OLANZapine (Zyprexa) 10 mg Q6H PRN (Oral): For acute agitation or psychosis. Collaborate with LCSW to address housing issues and facilitate communication with the leasing office. Explore options for temporary housing or resources if access to the apartment cannot be resolved promptly. Clonazepam (Klonopin) 0.5 mg BID (Oral): For anxiety and mood stabilization.  Myriam Forehand, NP 05/18/2023, 4:30 PM

## 2023-05-18 NOTE — Discharge Summary (Signed)
Physician Discharge Summary Note  Patient:  Anthony Luna is an 37 y.o., male MRN:  [id number] DOB:  May 05, 1986 Patient phone:  [phone] (home)  Patient address:   [address] [place] [address],  Total Time spent with patient: 1.5 hours  Date of Admission:  05/10/2023 Date of Discharge: 05/18/2023  Reason for Admission:  37 year old African American male with a history of schizophrenia and severe cannabis use disorder, was admitted voluntarily to the behavioral health unit with his case Production designer, theatre/television/film. The admission followed reports of wandering the streets, sleeping outside, and exhibiting erratic behaviors. The patient endorsed suicidal ideation with a plan to "get a knife" and auditory hallucinations (non-command in nature).He reported anhedonia, difficulty sleeping, a depressed mood, and feelings of isolation, stating, "I feel like people don't like me." The patient recently had been discharged from [facility] and was stabilized on Risperidone, Propranolol, and Cogentin. However, he lacked stable housing and reported being locked out of his apartment, further exacerbating his distress.The patient was admitted for stabilization, medication management, and to address safety concerns due to his suicidal ideation, psychosis, and unstable living situation.  Principal Problem: Schizophrenia [facility]) Discharge Diagnoses: Principal Problem:   Schizophrenia (HCC) Active Problems:   Cannabis use disorder, severe, dependence (HCC)   Past Psychiatric History: Acute Psychosis  Past Medical History:  Past Medical History:  Diagnosis Date   Depression    Since moving into new apartment 1 year ago   None to low serum cortisol response with adrenocorticotrophic hormone (ACTH) stimulation test    Schizophrenia, paranoid type (HCC)    History reviewed. No pertinent surgical history. Family History: History reviewed. No pertinent family history. Family Psychiatric  History: none  noted Social History:  Social History   Substance and Sexual Activity  Alcohol Use Yes   Alcohol/week: 0.0 standard drinks of alcohol   Comment: "Special occasions"     Social History   Substance and Sexual Activity  Drug Use Yes   Frequency: 4.0 times per week   Types: Marijuana   Comment: "2 blunts"    Social History   Socioeconomic History   Marital status: Single    Spouse name: Not on file   Number of children: Not on file   Years of education: Not on file   Highest education level: Not on file  Occupational History   Not on file  Tobacco Use   Smoking status: Every Day    Current packs/day: 0.50    Average packs/day: 0.5 packs/day for 20.5 years (10.2 ttl pk-yrs)    Types: Cigarettes    Start date: 11/29/2002   Smokeless tobacco: Former   Tobacco comments:    does not want to quit  Vaping Use   Vaping status: Never Used  Substance and Sexual Activity   Alcohol use: Yes    Alcohol/week: 0.0 standard drinks of alcohol    Comment: "Special occasions"   Drug use: Yes    Frequency: 4.0 times per week    Types: Marijuana    Comment: "2 blunts"   Sexual activity: Not on file  Other Topics Concern   Not on file  Social History Narrative   Not on file   Social Determinants of Health   Financial Resource Strain: Not on file  Food Insecurity: Food Insecurity Present (05/10/2023)   Hunger Vital Sign    Worried About Running Out of Food in the Last Year: Sometimes true    Ran Out of Food in the Last Year:  Often true  Transportation Needs: Unmet Transportation Needs (05/10/2023)   PRAPARE - Administrator, Civil Service (Medical): Yes    Lack of Transportation (Non-Medical): Yes  Physical Activity: Not on file  Stress: Not on file  Social Connections: Not on file    Hospital Course:  the patient was evaluated and confirmed to have active suicidal ideation with a specific plan, alongside auditory hallucinations and erratic behaviors. Immediate  interventions included:initiation of Risperidone for psychosis, Propranolol for anxiety and tachycardia, and Benztropine to manage extrapyramidal symptoms. The patient was compliant with the medication regimen, reporting no significant side effects.Comprehensive assessments were conducted to monitor the patient's mental status, symptom progression, and response to treatment. Over the course of hospitalization, the patient exhibited stabilization of mood, reduction in psychotic symptoms, and denial of suicidal or homicidal ideations.The patient participated in individual and group therapy sessions, focusing on coping strategies, medication adherence, and addressing psychosocial stressors. Engagement in these sessions improved over time, with the patient becoming more interactive and communicative.Collaboration with the case manager and community support services was prioritized to address housing instability and ensure continuity of care post-discharge. A safety plan was established, including crisis resources and follow-up appointments with outpatient services.the patient demonstrated significant improvement in mood and behavior, with no acute safety concerns. He was deemed stable for discharge with a comprehensive outpatient plan and support system in place.   Musculoskeletal: Strength & Muscle Tone: within normal limits Gait & Station: normal Patient leans: N/A   Psychiatric Specialty Exam:  Presentation  General Appearance:  Appropriate for Environment; Neat  Eye Contact: Minimal  Speech: Clear and Coherent; Normal Rate  Speech Volume: Normal  Handedness: Right   Mood and Affect  Mood: Euthymic  Affect: Appropriate   Thought Process  Thought Processes: Coherent  Descriptions of Associations:Intact  Orientation:Full (Time, Place and Person) (and situation)  Thought Content:WDL  History of Schizophrenia/Schizoaffective disorder:Yes  Duration of Psychotic  Symptoms:Greater than six months  Hallucinations:Hallucinations: None Description of Auditory Hallucinations: denies  Ideas of Reference:Other (comment)  Suicidal Thoughts:Suicidal Thoughts: No SI Active Intent and/or Plan: -- (denies)  Homicidal Thoughts:Homicidal Thoughts: No   Sensorium  Memory: Immediate Good; Remote Good  Judgment: Fair  Insight: Fair   Art therapist  Concentration: Good  Attention Span: Good; Fair  Recall: Good  Fund of Knowledge: Good  Language: Good   Psychomotor Activity  Psychomotor Activity: Psychomotor Activity: Normal   Assets  Assets: Housing; Manufacturing systems engineer; Financial Resources/Insurance   Sleep  Sleep: Sleep: Fair Number of Hours of Sleep: 4    Physical Exam: Physical Exam ROS Blood pressure 110/69, pulse 83, temperature 97.6 F (36.4 C), resp. rate 17, height 6' (1.829 m), weight 82.1 kg, SpO2 100%. Body mass index is 24.55 kg/m.   Social History   Tobacco Use  Smoking Status Every Day   Current packs/day: 0.50   Average packs/day: 0.5 packs/day for 20.5 years (10.2 ttl pk-yrs)   Types: Cigarettes   Start date: 11/29/2002  Smokeless Tobacco Former  Tobacco Comments   does not want to quit   Tobacco Cessation:  A prescription for an FDA-approved tobacco cessation medication provided at discharge   Blood Alcohol level:  Lab Results  Component Value Date    [facility] <10 05/09/2023   ETH <10 04/27/2023    Metabolic Disorder Labs:  Lab Results  Component Value Date   HGBA1C 5.5 05/11/2023   MPG 111.15 05/11/2023   MPG 99.67 04/30/2022   Lab Results  Component Value  Date   PROLACTIN 6.1 12/19/2014   PROLACTIN 1.3 (L) 11/30/2014   Lab Results  Component Value Date   CHOL 132 05/11/2023   TRIG 33 05/11/2023   HDL 54 05/11/2023   CHOLHDL 2.4 05/11/2023   VLDL 7 05/11/2023   LDLCALC 71 05/11/2023   LDLCALC 127 (H) 04/30/2022    See Psychiatric Specialty Exam and Suicide Risk  Assessment completed by Attending Physician prior to discharge.  Discharge destination:  Home  Is patient on multiple antipsychotic therapies at discharge:  Yes,   Do you recommend tapering to monotherapy for antipsychotics?  No   Has Patient had three or more failed trials of antipsychotic monotherapy by history:  Yes,   Antipsychotic medications that previously failed include:   3.  risperadol.  Recommended Plan for Multiple Antipsychotic Therapies: Second antipsychotic is Clozapine.  Reason for adding Clozapine continuation   Allergies as of 05/18/2023       Reactions   Shellfish Allergy         Medication List     TAKE these medications      Indication  albuterol 108 (90 Base) MCG/ACT inhaler Commonly known as: VENTOLIN HFA Inhale 1-2 puffs into the lungs every 6 (six) hours as needed for shortness of breath.  Indication: Asthma   benztropine 0.5 MG tablet Commonly known as: COGENTIN Take 2 tablets (1 mg total) by mouth 2 (two) times daily.  Indication: Extrapyramidal Reaction caused by Medications   buPROPion 150 MG 24 hr tablet Commonly known as: WELLBUTRIN XL Take 1 tablet (150 mg total) by mouth daily.  Indication: Major Depressive Disorder   cetirizine 10 MG tablet Commonly known as: ZYRTEC Take 1 tablet (10 mg total) by mouth daily.  Indication: Perennial Allergic Rhinitis   clonazePAM 0.5 MG tablet Commonly known as: KLONOPIN Take 1 tablet (0.5 mg total) by mouth 2 (two) times daily.  Indication: Feeling Anxious   clozapine 50 MG tablet Commonly known as: CLOZARIL Take 7 tablets (350 mg total) by mouth at bedtime.  Indication: Schizophrenia that does Not Respond to Usual Drug Therapy   cloZAPine 100 MG tablet Commonly known as: CLOZARIL Take 300 mg by mouth at bedtime. Take along with one 50 mg tablet for total 350 mg at bedtime  Indication: Schizoaffective Disorder, Schizophrenia   docusate sodium 100 MG capsule Commonly known as: COLACE Take  1 capsule (100 mg total) by mouth 2 (two) times daily.  Indication: Constipation   fluticasone 50 MCG/ACT nasal spray Commonly known as: FLONASE Place 2 sprays into both nostrils daily as needed for allergies or rhinitis.  Indication: Allergic Rhinitis   hydrOXYzine 25 MG tablet Commonly known as: ATARAX Take 1 tablet (25 mg total) by mouth 3 (three) times daily as needed for anxiety.  Indication: Feeling Anxious   Invega Trinza 819 MG/2.625ML Susp Generic drug: Paliperidone Palmitate Inject 819 mg into the muscle.  Indication: Schizophrenia   melatonin 5 MG Tabs Take 1 tablet (5 mg total) by mouth at bedtime. What changed: how much to take  Indication: Trouble Sleeping   montelukast 10 MG tablet Commonly known as: SINGULAIR Take 1 tablet (10 mg total) by mouth at bedtime.  Indication: Asthma   nicotine 21 mg/24hr patch Commonly known as: NICODERM CQ - dosed in mg/24 hours Place 1 patch (21 mg total) onto the skin daily. What changed: Another medication with the same name was added. Make sure you understand how and when to take each.  Indication: Nicotine Addiction   nicotine  14 mg/24hr patch Commonly known as: NICODERM CQ - dosed in mg/24 hours Place 1 patch (14 mg total) onto the skin daily for 28 days. Start taking on: May 19, 2023 What changed: You were already taking a medication with the same name, and this prescription was added. Make sure you understand how and when to take each.  Indication: Nicotine Addiction   propranolol 20 MG tablet Commonly known as: INDERAL Take 1 tablet (20 mg total) by mouth 2 (two) times daily.  Indication: Neuroleptic-Induced Akathisia   risperiDONE 1 MG tablet Commonly known as: RISPERDAL Take 1 tablet (1 mg total) by mouth 2 (two) times daily at 8 am and 4 pm.  Indication: Schizophrenia   traZODone 50 MG tablet Commonly known as: DESYREL Take 1 tablet (50 mg total) by mouth at bedtime as needed for sleep.  Indication:  Trouble Sleeping   Vitamin D (Ergocalciferol) 1.25 MG (50000 UNIT) Caps capsule Commonly known as: DRISDOL Take 1 capsule (50,000 Units total) by mouth every 7 (seven) days.  Indication: Vitamin D Deficiency        Follow-up Information     [facility] Follow up.   Why: Your team will follow up with you immediately after discharge, they will come to your home this afternoon. Contact information: [address] [place] [address] [phone]                 Follow-up recommendations:  Activity:  as tolerated Diet:  heart healthy  Comments:   Benztropine (Cogentin) - 1 mg BID Extrapyramidal symptoms (EPS) from antipsychotic medications. Bupropion (Wellbutrin XL) - 150 mg dailyMajor depressive disorder. Clonazepam (Klonopin) - 0.5 mg BID Anxiety. Clozapine (Clozaril):7 tablets (350 mg total) at bedtime. 100 mg tablet: Take 300 mg at bedtime with 50 mg for total 350 mg. Indication: Schizophrenia, schizoaffective disorder. Hydroxyzine (Atarax) - 25 mg TID PRN Anxiety. Hinda Glatter Trinza (Paliperidone Palmitate) - 819 mg intramuscular injection Schizophrenia (long-acting antipsychotic). Risperidone (Risperdal) - 1 mg BID (8 am, 4 pm) Schizophrenia. Melatonin - 5 mg at bedtime Trouble sleeping. Trazodone (Desyrel) - 50 mg PRN at bedtimeSleep disturbances. Nicotine Addiction: Nicotine Patch (Nicoderm CQ): 21 mg daily patch - Currently in use. 14 mg daily patch - Scheduled to start on May 19, 2023 (taper plan). Indication: Nicotine addiction Case Manager Contact: [name], [facility] [phone]). Suicide & Crisis Lifeline: Dial 988. Mobile Crisis Unit: [phone] [facility]). Local Emergency Department: [facility] Emergency Department at [facility]: [address] [employer], [place], [place] [address]. Phone: [phone]. Attend outpatient appointments at [facility] as scheduled. Signed: Myriam Forehand, NP 05/18/2023, 12:10 PM

## 2023-05-18 NOTE — Progress Notes (Signed)
  [facility] Adult Case Management Discharge Plan :  Will you be returning to the same living situation after discharge:  Yes,  pt returning home At discharge, do you have transportation home?: Yes,  CSW to assist with transportation needs Do you have the ability to pay for your medications: Yes,  [place] MEDICAID / [facility]  Release of information consent forms completed and in the chart;  Patient's signature needed at discharge.  Patient to Follow up at:  Follow-up Information     [facility] Follow up.   Why: Your team will follow up with you immediately after discharge, they will come to your home this afternoon. Contact information: [address] [place] [address] [phone]                 Next level of care provider has access to [facility] Link:no  Safety Planning and Suicide Prevention discussed: Yes,  SPE completed with the patient.  Collateral contact attempts were not successful.     Has patient been referred to the Quitline?: Patient refused referral for treatment  Patient has been referred for addiction treatment: Patient refused referral for treatment.  Harden Mo, LCSW 05/18/2023, 12:21 PM

## 2023-05-18 NOTE — Progress Notes (Signed)
[facility] MD Progress Note  05/18/2023 5:32 PM Anthony Luna  MRN:  [id number] Subjective:   37 year old African American male He denies suicidal ideation (SI), homicidal ideation (HI), and auditory/visual hallucinations (AVH).The patient demonstrates stability with no acute safety concerns, including SI, HI, or AVH.No evidence of distress or decompensation at this time.  Principal Problem: Schizophrenia (HCC) Diagnosis: Principal Problem:   Schizophrenia (HCC) Active Problems:   Cannabis use disorder, severe, dependence (HCC)  Total Time spent with patient: 45 minutes  Past Psychiatric History: Anxiety Depression  Past Medical History:  Past Medical History:  Diagnosis Date   Depression    Since moving into new apartment 1 year ago   None to low serum cortisol response with adrenocorticotrophic hormone (ACTH) stimulation test    Schizophrenia, paranoid type (HCC)    History reviewed. No pertinent surgical history. Family History: History reviewed. No pertinent family history. Family Psychiatric  History: none reported Social History:  Social History   Substance and Sexual Activity  Alcohol Use Yes   Alcohol/week: 0.0 standard drinks of alcohol   Comment: "Special occasions"     Social History   Substance and Sexual Activity  Drug Use Yes   Frequency: 4.0 times per week   Types: Marijuana   Comment: "2 blunts"    Social History   Socioeconomic History   Marital status: Single    Spouse name: Not on file   Number of children: Not on file   Years of education: Not on file   Highest education level: Not on file  Occupational History   Not on file  Tobacco Use   Smoking status: Every Day    Current packs/day: 0.50    Average packs/day: 0.5 packs/day for 20.5 years (10.2 ttl pk-yrs)    Types: Cigarettes    Start date: 11/29/2002   Smokeless tobacco: Former   Tobacco comments:    does not want to quit  Vaping Use   Vaping status: Never Used  Substance and  Sexual Activity   Alcohol use: Yes    Alcohol/week: 0.0 standard drinks of alcohol    Comment: "Special occasions"   Drug use: Yes    Frequency: 4.0 times per week    Types: Marijuana    Comment: "2 blunts"   Sexual activity: Not on file  Other Topics Concern   Not on file  Social History Narrative   Not on file   Social Determinants of Health   Financial Resource Strain: Not on file  Food Insecurity: Food Insecurity Present (05/10/2023)   Hunger Vital Sign    Worried About Running Out of Food in the Last Year: Sometimes true    Ran Out of Food in the Last Year: Often true  Transportation Needs: Unmet Transportation Needs (05/10/2023)   PRAPARE - Administrator, Civil Service (Medical): Yes    Lack of Transportation (Non-Medical): Yes  Physical Activity: Not on file  Stress: Not on file  Social Connections: Not on file   Additional Social History:  Specify valuables returned: clothing                      Sleep: Fair  Appetite:  Good  Current Medications: Current Facility-Administered Medications  Medication Dose Route Frequency Provider Last Rate Last Admin   acetaminophen (TYLENOL) tablet 650 mg  650 mg Oral Q6H PRN Jearld Lesch, NP       alum & mag hydroxide-simeth (MAALOX/MYLANTA) 200-200-20 MG/5ML suspension 30 mL  30 mL Oral Q4H PRN Jearld Lesch, NP   30 mL at 05/16/23 1611   benztropine (COGENTIN) tablet 1 mg  1 mg Oral BID Jearld Lesch, NP   1 mg at 05/18/23 0849   buPROPion (WELLBUTRIN XL) 24 hr tablet 150 mg  150 mg Oral Daily Durwin Nora, Rashaun M, NP   150 mg at 05/18/23 0848   clonazePAM (KLONOPIN) tablet 0.5 mg  0.5 mg Oral BID Lerry Liner M, NP   0.5 mg at 05/18/23 0849   haloperidol lactate (HALDOL) injection 5 mg  5 mg Intramuscular TID PRN Jearld Lesch, NP       And   diphenhydrAMINE (BENADRYL) injection 50 mg  50 mg Intramuscular TID PRN Jearld Lesch, NP       And   LORazepam (ATIVAN) injection 2 mg  2 mg  Intramuscular TID PRN Jearld Lesch, NP       hydrOXYzine (ATARAX) tablet 25 mg  25 mg Oral TID PRN Jearld Lesch, NP   25 mg at 05/15/23 1556   magnesium hydroxide (MILK OF MAGNESIA) suspension 30 mL  30 mL Oral Daily PRN Jearld Lesch, NP       melatonin tablet 5 mg  5 mg Oral QHS Dixon, Rashaun M, NP   5 mg at 05/17/23 2124   montelukast (SINGULAIR) tablet 10 mg  10 mg Oral QHS Dixon, Rashaun M, NP   10 mg at 05/17/23 2124   nicotine (NICODERM CQ - dosed in mg/24 hours) patch 14 mg  14 mg Transdermal Daily Sarina Ill, DO   14 mg at 05/18/23 0848   OLANZapine (ZYPREXA) tablet 10 mg  10 mg Oral Q6H PRN Sarina Ill, DO   10 mg at 05/15/23 1556   propranolol (INDERAL) tablet 20 mg  20 mg Oral BID Lerry Liner M, NP   20 mg at 05/18/23 0848   risperiDONE (RISPERDAL) tablet 1 mg  1 mg Oral [id number] Sarina Ill, DO   1 mg at 05/18/23 1610   traZODone (DESYREL) tablet 50 mg  50 mg Oral QHS PRN Jearld Lesch, NP   50 mg at 05/17/23 2124   Vitamin D (Ergocalciferol) (DRISDOL) 1.25 MG (50000 UNIT) capsule 50,000 Units  50,000 Units Oral Q7 days Jearld Lesch, NP       Current Outpatient Medications  Medication Sig Dispense Refill   albuterol (VENTOLIN HFA) 108 (90 Base) MCG/ACT inhaler Inhale 1-2 puffs into the lungs every 6 (six) hours as needed for shortness of breath. 18 g 1   benztropine (COGENTIN) 0.5 MG tablet Take 2 tablets (1 mg total) by mouth 2 (two) times daily. 60 tablet 1   buPROPion (WELLBUTRIN XL) 150 MG 24 hr tablet Take 1 tablet (150 mg total) by mouth daily. 30 tablet 1   cetirizine (ZYRTEC) 10 MG tablet Take 1 tablet (10 mg total) by mouth daily. 30 tablet 1   clonazePAM (KLONOPIN) 0.5 MG tablet Take 1 tablet (0.5 mg total) by mouth 2 (two) times daily. 60 tablet 1   cloZAPine (CLOZARIL) 100 MG tablet Take 300 mg by mouth at bedtime. Take along with one 50 mg tablet for total 350 mg at bedtime     cloZAPine (CLOZARIL) 50 MG tablet  Take 7 tablets (350 mg total) by mouth at bedtime. (Patient not taking: Reported on 04/27/2023) 210 tablet 0   docusate sodium (COLACE) 100 MG capsule Take 1 capsule (100 mg total) by mouth 2 (two)  times daily. 60 capsule 1   fluticasone (FLONASE) 50 MCG/ACT nasal spray Place 2 sprays into both nostrils daily as needed for allergies or rhinitis. 16 g 1   hydrOXYzine (ATARAX) 25 MG tablet Take 1 tablet (25 mg total) by mouth 3 (three) times daily as needed for anxiety. 30 tablet 1   melatonin 5 MG TABS Take 1 tablet (5 mg total) by mouth at bedtime. (Patient taking differently: Take 3 mg by mouth at bedtime.) 30 tablet 1   montelukast (SINGULAIR) 10 MG tablet Take 1 tablet (10 mg total) by mouth at bedtime. 30 tablet 1   [START ON 05/19/2023] nicotine (NICODERM CQ - DOSED IN MG/24 HOURS) 14 mg/24hr patch Place 1 patch (14 mg total) onto the skin daily for 28 days. 28 patch 0   nicotine (NICODERM CQ - DOSED IN MG/24 HOURS) 21 mg/24hr patch Place 1 patch (21 mg total) onto the skin daily. 28 patch 1   OLANZapine (ZYPREXA) 10 MG tablet Take 1 tablet (10 mg total) by mouth every 6 (six) hours as needed (Psychosis). 30 tablet 0   Paliperidone Palmitate (INVEGA TRINZA) 819 MG/2.625ML SUSP Inject 819 mg into the muscle.     propranolol (INDERAL) 20 MG tablet Take 1 tablet (20 mg total) by mouth 2 (two) times daily. 60 tablet 0   risperiDONE (RISPERDAL) 1 MG tablet Take 1 tablet (1 mg total) by mouth 2 (two) times daily at 8 am and 4 pm. 60 tablet 0   traZODone (DESYREL) 50 MG tablet Take 1 tablet (50 mg total) by mouth at bedtime as needed for sleep. 30 tablet 0   Vitamin D, Ergocalciferol, (DRISDOL) 1.25 MG (50000 UNIT) CAPS capsule Take 1 capsule (50,000 Units total) by mouth every 7 (seven) days. (Patient not taking: Reported on 04/27/2023) 5 capsule 1   Vitamin D, Ergocalciferol, (DRISDOL) 1.25 MG (50000 UNIT) CAPS capsule Take 1 capsule (50,000 Units total) by mouth every 7 (seven) days for 5 doses. 5  capsule 0    Lab Results: No results found for this or any previous visit (from the past 48 hour(s)).  Blood Alcohol level:  Lab Results  Component Value Date   ETH <10 05/09/2023   ETH <10 04/27/2023    Metabolic Disorder Labs: Lab Results  Component Value Date   HGBA1C 5.5 05/11/2023   MPG 111.15 05/11/2023   MPG 99.67 04/30/2022   Lab Results  Component Value Date   PROLACTIN 6.1 12/19/2014   PROLACTIN 1.3 (L) 11/30/2014   Lab Results  Component Value Date   CHOL 132 05/11/2023   TRIG 33 05/11/2023   HDL 54 05/11/2023   CHOLHDL 2.4 05/11/2023   VLDL 7 05/11/2023   LDLCALC 71 05/11/2023   LDLCALC 127 (H) 04/30/2022    Physical Findings: AIMS:  , ,  ,  ,    CIWA:    COWS:     Musculoskeletal: Strength & Muscle Tone: within normal limits Gait & Station: normal Patient leans: N/A  Psychiatric Specialty Exam:  Presentation  General Appearance:  Appropriate for Environment; Neat  Eye Contact: Minimal  Speech: Clear and Coherent; Normal Rate  Speech Volume: Normal  Handedness: Right   Mood and Affect  Mood: Euthymic  Affect: Appropriate   Thought Process  Thought Processes: Coherent  Descriptions of Associations:Intact  Orientation:Full (Time, Place and Person) (and situation)  Thought Content:WDL  History of Schizophrenia/Schizoaffective disorder:Yes  Duration of Psychotic Symptoms:Greater than six months  Hallucinations:Hallucinations: None Description of Auditory Hallucinations: denies  Ideas of Reference:Other (comment)  Suicidal Thoughts:Suicidal Thoughts: No SI Active Intent and/or Plan: -- (denies)  Homicidal Thoughts:Homicidal Thoughts: No   Sensorium  Memory: Immediate Good; Remote Good  Judgment: Fair  Insight: Fair   Art therapist  Concentration: Good  Attention Span: Good; Fair  Recall: Good  Fund of Knowledge: Good  Language: Good   Psychomotor Activity  Psychomotor  Activity:Psychomotor Activity: Normal   Assets  Assets: Housing; Manufacturing systems engineer; Financial Resources/Insurance   Sleep  Sleep:Sleep: Fair Number of Hours of Sleep: 4    Physical Exam: Physical Exam Vitals and nursing note reviewed.  Constitutional:      Appearance: Normal appearance.  HENT:     Head: Normocephalic and atraumatic.     Nose: Nose normal.  Pulmonary:     Effort: Pulmonary effort is normal.  Musculoskeletal:        General: Normal range of motion.     Cervical back: Normal range of motion.  Neurological:     General: No focal deficit present.     Mental Status: He is alert. Mental status is at baseline.  Psychiatric:        Attention and Perception: Attention and perception normal.        Mood and Affect: Mood is anxious. Affect is flat.        Speech: Speech normal.        Behavior: Behavior normal. Behavior is cooperative.        Thought Content: Thought content normal.        Cognition and Memory: Cognition and memory normal.        Judgment: Judgment is impulsive.    Review of Systems  All other systems reviewed and are negative.  Blood pressure 110/69, pulse 83, temperature 97.6 F (36.4 C), resp. rate 17, height 6' (1.829 m), weight 82.1 kg, SpO2 100%. Body mass index is 24.55 kg/m.   Treatment Plan Summary: Daily contact with patient to assess and evaluate symptoms and progress in treatment and Medication management Benztropine (Cogentin) - 1 mg Oral BID Management of extrapyramidal symptoms (EPS) or Parkinsonism. Bupropion (Wellbutrin XL) - 150 mg Oral Daily Depression, smoking cessation, or anxiety management. Melatonin - 5 mg Oral at bedtime Sleep aid. Propranolol (Inderal) - 20 mg Oral BID  Anxiety, tachycardia, or hypertension. Risperidone (Risperdal) - 1 mg Oral BID (morning and evening)Schizophrenia, bipolar disorder, or irritability. Myriam Forehand, NP 05/13/2023, 5:32 PM

## 2023-05-18 NOTE — Plan of Care (Signed)
  Problem: Education: Goal: Emotional status will improve Outcome: Progressing   Problem: Education: Goal: Knowledge of [facility] Education information/materials will improve Outcome: Progressing   Problem: Education: Goal: Mental status will improve Outcome: Progressing

## 2023-05-18 NOTE — Group Note (Signed)
Recreation Therapy Group Note   Group Topic:Coping Skills  Group Date: 05/18/2023 Start Time: 1010 End Time: 1105 Facilitators: Rosina Lowenstein, LRT, CTRS Location:  Craft Room  Group Description: Mind Map.  Patient was provided a blank template of a diagram with 32 blank boxes in a tiered system, branching from the center (similar to a bubble chart). LRT directed patients to label the middle of the diagram "Coping Skills". LRT and patients then came up with 8 different coping skills as examples. Pt were directed to record their coping skills in the 2nd tier boxes closest to the center.  Patients would then share their coping skills with the group as LRT wrote them out. LRT gave a handout of 99 different coping skills at the end of group.   Goal Area(s) Addressed: Patients will be able to define "coping skills". Patient will identify new coping skills.  Patient will increase communication.   Affect/Mood: N/A   Participation Level: Did not attend    Clinical Observations/Individualized Feedback: Anthony Luna did not attend group.   Plan: Continue to engage patient in RT group sessions 2-3x/week.   Rosina Lowenstein, LRT, CTRS 05/18/2023 12:13 PM

## 2023-05-18 NOTE — [facility] Suicide Risk Assessment (Addendum)
[facility] Discharge Suicide Risk Assessment   Principal Problem: Schizophrenia [facility]) Discharge Diagnoses: Principal Problem:   Schizophrenia (HCC)   Total Time spent with patient: 1.5 hours  Musculoskeletal: Strength & Muscle Tone: within normal limits Gait & Station: normal Patient leans: N/A  Psychiatric Specialty Exam  Presentation  General Appearance:  Appropriate for Environment; Neat  Eye Contact: Minimal  Speech: Clear and Coherent; Normal Rate  Speech Volume: Normal  Handedness: Right   Mood and Affect  Mood: Euthymic  Duration of Depression Symptoms: Less than two weeks  Affect: Appropriate   Thought Process  Thought Processes: Coherent  Descriptions of Associations:Intact  Orientation:Full (Time, Place and Person) (and situation)  Thought Content:WDL  History of Schizophrenia/Schizoaffective disorder:Yes  Duration of Psychotic Symptoms:Greater than six months  Hallucinations:Hallucinations: None Description of Auditory Hallucinations: denies  Ideas of Reference:Other (comment)  Suicidal Thoughts:Suicidal Thoughts: No SI Active Intent and/or Plan: -- (denies)  Homicidal Thoughts:Homicidal Thoughts: No   Sensorium  Memory: Immediate Good; Remote Good  Judgment: Fair  Insight: Fair   Art therapist  Concentration: Good  Attention Span: Good; Fair  Recall: Good  Fund of Knowledge: Good  Language: Good   Psychomotor Activity  Psychomotor Activity:Psychomotor Activity: Normal   Assets  Assets: Housing; Manufacturing systems engineer; Financial Resources/Insurance   Sleep  Sleep:Sleep: Fair Number of Hours of Sleep: 4   Physical Exam: Physical Exam Vitals and nursing note reviewed.  Constitutional:      Appearance: Normal appearance.  HENT:     Head: Normocephalic and atraumatic.     Nose: Nose normal.  Pulmonary:     Effort: Pulmonary effort is normal.  Musculoskeletal:        General: Normal range of  motion.     Cervical back: Normal range of motion.  Neurological:     General: No focal deficit present.     Mental Status: He is alert. Mental status is at baseline.  Psychiatric:        Attention and Perception: Attention and perception normal.        Mood and Affect: Mood and affect normal.        Speech: Speech normal.        Behavior: Behavior is cooperative.        Thought Content: Thought content normal.        Cognition and Memory: Cognition and memory normal.        Judgment: Judgment normal.    Review of Systems  All other systems reviewed and are negative.  Blood pressure 110/69, pulse 83, temperature 97.6 F (36.4 C), resp. rate 17, height 6' (1.829 m), weight 82.1 kg, SpO2 100%. Body mass index is 24.55 kg/m.  Mental Status Per Nursing Assessment::   On Admission:  NA  Demographic Factors:  Male, Low socioeconomic status, Living alone, and Unemployed  Loss Factors: NA  Historical Factors: Family history of mental illness or substance abuse  Risk Reduction Factors:   NA  Continued Clinical Symptoms:  Alcohol/Substance Abuse/Dependencies Schizophrenia:   Less than 11 years old Paranoid or undifferentiated type More than one psychiatric diagnosis  Cognitive Features That Contribute To Risk:  None    Suicide Risk:  Minimal: No identifiable suicidal ideation.  Patients presenting with no risk factors but with morbid ruminations; may be classified as minimal risk based on the severity of the depressive symptoms   Follow-up Information     [facility] Follow up.   Why: Your team will follow up with you immediately after  discharge, they will come to your home this afternoon. Contact information: [address] [place] [address] [phone]                 Plan Of Care/Follow-up recommendations:  Activity:  as tolerated Diet:  heart healthy Bupropion (Wellbutrin XL) - 150 mg dailyMajor depressive disorder. Clonazepam (Klonopin)  - 0.5 mg BID Anxiety. Clozapine (Clozaril):7 tablets (350 mg total) at bedtime. 100 mg tablet: Take 300 mg at bedtime with 50 mg for total 350 mg. Indication: Schizophrenia, schizoaffective disorder. Hydroxyzine (Atarax) - 25 mg TID PRN Anxiety. Hinda Glatter Trinza (Paliperidone Palmitate) - 819 mg intramuscular injection Schizophrenia (long-acting antipsychotic). Risperidone (Risperdal) - 1 mg BID (8 am, 4 pm) Schizophrenia. Melatonin - 5 mg at bedtime Trouble sleeping. Trazodone (Desyrel) - 50 mg PRN at bedtimeSleep disturbances. Nicotine Addiction: Nicotine Patch (Nicoderm CQ): 21 mg daily patch - Currently in use. 14 mg daily patch - Scheduled to start on May 19, 2023 (taper plan). Indication: Nicotine addiction Case Manager Contact: [name], [facility] [phone]). Suicide & Crisis Lifeline: Dial 988. Mobile Crisis Unit: [phone] [facility]). Local Emergency Department: [facility] Emergency Department at [facility]: [address] [place], [place], [place] [address]. Phone: [phone]. Attend outpatient appointments at [facility] as scheduled. Myriam Forehand, NP 05/18/2023, 11:54 AM

## 2023-05-18 NOTE — Group Note (Signed)
Date:  05/18/2023 Time:  10:25 AM  Group Topic/Focus:  Dimensions of Wellness:   The focus of this group is to introduce the topic of wellness and discuss the role each dimension of wellness plays in total health. Goals Group:   The focus of this group is to help patients establish daily goals to achieve during treatment and discuss how the patient can incorporate goal setting into their daily lives to aide in recovery. Self Care:   The focus of this group is to help patients understand the importance of self-care in order to improve or restore emotional, physical, spiritual, interpersonal, and financial health.    Participation Level:  Active  Participation Quality:  Appropriate and Attentive  Affect:  Appropriate  Cognitive:  Appropriate and Oriented  Insight: Appropriate  Engagement in Group:  Developing/Improving and Engaged  Modes of Intervention:  Activity, Discussion, and Education  Additional Comments:    Anthony Luna 05/18/2023, 10:25 AM

## 2023-05-18 NOTE — Progress Notes (Signed)
[facility] MD Progress Note  05/14/2023 10:41 AM Anthony Luna  MRN:  [id number] Subjective:  37 year old African American male "I am fine." He denies suicidal ideation (SI), homicidal ideation (HI), and auditory/visual hallucinations (AVH).The patient demonstrates stability with no acute safety concerns, including SI, HI, or AVH.No evidence of distress or decompensation at this time. Principal Problem: Schizophrenia (HCC) Diagnosis: Principal Problem:   Schizophrenia (HCC) Active Problems:   Cannabis use disorder, severe, dependence (HCC)  Total Time spent with patient: 45 minutes  Past Psychiatric History: : Depression   Past Medical History:  Past Medical History:  Diagnosis Date   Depression    Since moving into new apartment 1 year ago   None to low serum cortisol response with adrenocorticotrophic hormone (ACTH) stimulation test    Schizophrenia, paranoid type (HCC)    History reviewed. No pertinent surgical history. Family History: History reviewed. No pertinent family history. Family Psychiatric  History: none reported Social History:  Social History   Substance and Sexual Activity  Alcohol Use Yes   Alcohol/week: 0.0 standard drinks of alcohol   Comment: "Special occasions"     Social History   Substance and Sexual Activity  Drug Use Yes   Frequency: 4.0 times per week   Types: Marijuana   Comment: "2 blunts"    Social History   Socioeconomic History   Marital status: Single    Spouse name: Not on file   Number of children: Not on file   Years of education: Not on file   Highest education level: Not on file  Occupational History   Not on file  Tobacco Use   Smoking status: Every Day    Current packs/day: 0.50    Average packs/day: 0.5 packs/day for 20.5 years (10.2 ttl pk-yrs)    Types: Cigarettes    Start date: 11/29/2002   Smokeless tobacco: Former   Tobacco comments:    does not want to quit  Vaping Use   Vaping status: Never Used  Substance  and Sexual Activity   Alcohol use: Yes    Alcohol/week: 0.0 standard drinks of alcohol    Comment: "Special occasions"   Drug use: Yes    Frequency: 4.0 times per week    Types: Marijuana    Comment: "2 blunts"   Sexual activity: Not on file  Other Topics Concern   Not on file  Social History Narrative   Not on file   Social Determinants of Health   Financial Resource Strain: Not on file  Food Insecurity: Food Insecurity Present (05/10/2023)   Hunger Vital Sign    Worried About Running Out of Food in the Last Year: Sometimes true    Ran Out of Food in the Last Year: Often true  Transportation Needs: Unmet Transportation Needs (05/10/2023)   PRAPARE - Administrator, Civil Service (Medical): Yes    Lack of Transportation (Non-Medical): Yes  Physical Activity: Not on file  Stress: Not on file  Social Connections: Not on file   Additional Social History:  Specify valuables returned: clothing                      Sleep: Fair  Appetite:  Good  Current Medications: Current Facility-Administered Medications  Medication Dose Route Frequency Provider Last Rate Last Admin   acetaminophen (TYLENOL) tablet 650 mg  650 mg Oral Q6H PRN Jearld Lesch, NP       alum & mag hydroxide-simeth (MAALOX/MYLANTA) 200-200-20 MG/5ML suspension 30  mL  30 mL Oral Q4H PRN Jearld Lesch, NP   30 mL at 05/16/23 1611   benztropine (COGENTIN) tablet 1 mg  1 mg Oral BID Jearld Lesch, NP   1 mg at 05/18/23 0849   buPROPion (WELLBUTRIN XL) 24 hr tablet 150 mg  150 mg Oral Daily Durwin Nora, Rashaun M, NP   150 mg at 05/18/23 0848   clonazePAM (KLONOPIN) tablet 0.5 mg  0.5 mg Oral BID Lerry Liner M, NP   0.5 mg at 05/18/23 0849   haloperidol lactate (HALDOL) injection 5 mg  5 mg Intramuscular TID PRN Jearld Lesch, NP       And   diphenhydrAMINE (BENADRYL) injection 50 mg  50 mg Intramuscular TID PRN Jearld Lesch, NP       And   LORazepam (ATIVAN) injection 2 mg  2 mg  Intramuscular TID PRN Jearld Lesch, NP       hydrOXYzine (ATARAX) tablet 25 mg  25 mg Oral TID PRN Jearld Lesch, NP   25 mg at 05/15/23 1556   magnesium hydroxide (MILK OF MAGNESIA) suspension 30 mL  30 mL Oral Daily PRN Jearld Lesch, NP       melatonin tablet 5 mg  5 mg Oral QHS Dixon, Rashaun M, NP   5 mg at 05/17/23 2124   montelukast (SINGULAIR) tablet 10 mg  10 mg Oral QHS Dixon, Rashaun M, NP   10 mg at 05/17/23 2124   nicotine (NICODERM CQ - dosed in mg/24 hours) patch 14 mg  14 mg Transdermal Daily Sarina Ill, DO   14 mg at 05/18/23 0848   OLANZapine (ZYPREXA) tablet 10 mg  10 mg Oral Q6H PRN Sarina Ill, DO   10 mg at 05/15/23 1556   propranolol (INDERAL) tablet 20 mg  20 mg Oral BID Lerry Liner M, NP   20 mg at 05/18/23 0848   risperiDONE (RISPERDAL) tablet 1 mg  1 mg Oral [id number] Sarina Ill, DO   1 mg at 05/18/23 4742   traZODone (DESYREL) tablet 50 mg  50 mg Oral QHS PRN Jearld Lesch, NP   50 mg at 05/17/23 2124   Vitamin D (Ergocalciferol) (DRISDOL) 1.25 MG (50000 UNIT) capsule 50,000 Units  50,000 Units Oral Q7 days Jearld Lesch, NP       Current Outpatient Medications  Medication Sig Dispense Refill   albuterol (VENTOLIN HFA) 108 (90 Base) MCG/ACT inhaler Inhale 1-2 puffs into the lungs every 6 (six) hours as needed for shortness of breath. 18 g 1   benztropine (COGENTIN) 0.5 MG tablet Take 2 tablets (1 mg total) by mouth 2 (two) times daily. 60 tablet 1   buPROPion (WELLBUTRIN XL) 150 MG 24 hr tablet Take 1 tablet (150 mg total) by mouth daily. 30 tablet 1   cetirizine (ZYRTEC) 10 MG tablet Take 1 tablet (10 mg total) by mouth daily. 30 tablet 1   clonazePAM (KLONOPIN) 0.5 MG tablet Take 1 tablet (0.5 mg total) by mouth 2 (two) times daily. 60 tablet 1   cloZAPine (CLOZARIL) 100 MG tablet Take 300 mg by mouth at bedtime. Take along with one 50 mg tablet for total 350 mg at bedtime     cloZAPine (CLOZARIL) 50 MG tablet  Take 7 tablets (350 mg total) by mouth at bedtime. (Patient not taking: Reported on 04/27/2023) 210 tablet 0   docusate sodium (COLACE) 100 MG capsule Take 1 capsule (100 mg total) by mouth  2 (two) times daily. 60 capsule 1   fluticasone (FLONASE) 50 MCG/ACT nasal spray Place 2 sprays into both nostrils daily as needed for allergies or rhinitis. 16 g 1   hydrOXYzine (ATARAX) 25 MG tablet Take 1 tablet (25 mg total) by mouth 3 (three) times daily as needed for anxiety. 30 tablet 1   melatonin 5 MG TABS Take 1 tablet (5 mg total) by mouth at bedtime. (Patient taking differently: Take 3 mg by mouth at bedtime.) 30 tablet 1   montelukast (SINGULAIR) 10 MG tablet Take 1 tablet (10 mg total) by mouth at bedtime. 30 tablet 1   [START ON 05/19/2023] nicotine (NICODERM CQ - DOSED IN MG/24 HOURS) 14 mg/24hr patch Place 1 patch (14 mg total) onto the skin daily for 28 days. 28 patch 0   nicotine (NICODERM CQ - DOSED IN MG/24 HOURS) 21 mg/24hr patch Place 1 patch (21 mg total) onto the skin daily. 28 patch 1   OLANZapine (ZYPREXA) 10 MG tablet Take 1 tablet (10 mg total) by mouth every 6 (six) hours as needed (Psychosis). 30 tablet 0   Paliperidone Palmitate (INVEGA TRINZA) 819 MG/2.625ML SUSP Inject 819 mg into the muscle.     propranolol (INDERAL) 20 MG tablet Take 1 tablet (20 mg total) by mouth 2 (two) times daily. 60 tablet 0   risperiDONE (RISPERDAL) 1 MG tablet Take 1 tablet (1 mg total) by mouth 2 (two) times daily at 8 am and 4 pm. 60 tablet 0   traZODone (DESYREL) 50 MG tablet Take 1 tablet (50 mg total) by mouth at bedtime as needed for sleep. 30 tablet 0   Vitamin D, Ergocalciferol, (DRISDOL) 1.25 MG (50000 UNIT) CAPS capsule Take 1 capsule (50,000 Units total) by mouth every 7 (seven) days. (Patient not taking: Reported on 04/27/2023) 5 capsule 1   Vitamin D, Ergocalciferol, (DRISDOL) 1.25 MG (50000 UNIT) CAPS capsule Take 1 capsule (50,000 Units total) by mouth every 7 (seven) days for 5 doses. 5  capsule 0    Lab Results: No results found for this or any previous visit (from the past 48 hour(s)).  Blood Alcohol level:  Lab Results  Component Value Date   ETH <10 05/09/2023   ETH <10 04/27/2023    Metabolic Disorder Labs: Lab Results  Component Value Date   HGBA1C 5.5 05/11/2023   MPG 111.15 05/11/2023   MPG 99.67 04/30/2022   Lab Results  Component Value Date   PROLACTIN 6.1 12/19/2014   PROLACTIN 1.3 (L) 11/30/2014   Lab Results  Component Value Date   CHOL 132 05/11/2023   TRIG 33 05/11/2023   HDL 54 05/11/2023   CHOLHDL 2.4 05/11/2023   VLDL 7 05/11/2023   LDLCALC 71 05/11/2023   LDLCALC 127 (H) 04/30/2022    Physical Findings: AIMS:  , ,  ,  ,    CIWA:    COWS:     Musculoskeletal: Strength & Muscle Tone: within normal limits Gait & Station: normal Patient leans: N/A  Psychiatric Specialty Exam:  Presentation  General Appearance:  Appropriate for Environment; Neat  Eye Contact: Minimal  Speech: Clear and Coherent; Normal Rate  Speech Volume: Normal  Handedness: Right   Mood and Affect  Mood: Euthymic  Affect: Appropriate   Thought Process  Thought Processes: Coherent  Descriptions of Associations:Intact  Orientation:Full (Time, Place and Person) (and situation)  Thought Content:WDL  History of Schizophrenia/Schizoaffective disorder:Yes  Duration of Psychotic Symptoms:Greater than six months  Hallucinations:Hallucinations: None Description of Auditory Hallucinations:  denies  Ideas of Reference:Other (comment)  Suicidal Thoughts:Suicidal Thoughts: No SI Active Intent and/or Plan: -- (denies)  Homicidal Thoughts:Homicidal Thoughts: No   Sensorium  Memory: Immediate Good; Remote Good  Judgment: Fair  Insight: Fair   Art therapist  Concentration: Good  Attention Span: Good; Fair  Recall: Dudley Major of Knowledge: Good  Language: Good   Psychomotor Activity  Psychomotor  Activity:Psychomotor Activity: Normal   Assets  Assets: Housing; Manufacturing systems engineer; Financial Resources/Insurance   Sleep  Sleep:Sleep: Fair Number of Hours of Sleep: 4    Physical Exam: Physical Exam Vitals and nursing note reviewed.  Constitutional:      Appearance: Normal appearance.  HENT:     Head: Normocephalic and atraumatic.     Nose: Nose normal.  Pulmonary:     Effort: Pulmonary effort is normal.  Musculoskeletal:        General: Normal range of motion.     Cervical back: Normal range of motion.  Neurological:     General: No focal deficit present.     Mental Status: He is alert.  Psychiatric:        Attention and Perception: Attention and perception normal.        Mood and Affect: Mood is anxious. Affect is flat.        Speech: Speech normal.        Behavior: Behavior normal. Behavior is cooperative.        Thought Content: Thought content normal.        Cognition and Memory: Cognition and memory normal.        Judgment: Judgment normal.    Review of Systems  All other systems reviewed and are negative.  Blood pressure 110/69, pulse 83, temperature 97.6 F (36.4 C), resp. rate 17, height 6' (1.829 m), weight 82.1 kg, SpO2 100%. Body mass index is 24.55 kg/m.   Treatment Plan Summary: Daily contact with patient to assess and evaluate symptoms and progress in treatment and Medication management Benztropine (Cogentin) - 1 mg Oral BID Management of extrapyramidal symptoms (EPS) or Parkinsonism. Bupropion (Wellbutrin XL) - 150 mg Oral Daily Depression, smoking cessation, or anxiety management. Melatonin - 5 mg Oral at bedtime Sleep aid. Propranolol (Inderal) - 20 mg Oral BID  Anxiety, tachycardia, or hypertension. Risperidone (Risperdal) - 1 mg Oral BID (morning and evening)Schizophrenia, bipolar disorder, or irritability.  Myriam Forehand, NP 05/18/2023, 5:08 PM

## 2023-05-18 NOTE — BHH Counselor (Signed)
CSW spoke with the patient's caseworker, Leonette Most, with [employer].  He requests that patient remain in the hospital for an additional day while a group home bed is procured.    CSW informed that she will need to check with the NP Coralee North, however, at this time discharge is likely to proceed as scheduled.  CSW did check with Coralee North, NP who confirms that discharge is continuing as scheduled.  CSW informed both Jillyn Hidden, the individual with the patient's keys and Leonette Most, the caseworker with [facility] [employer].  Penni Homans, MSW, LCSW 05/18/2023 12:51 PM

## 2023-05-23 ENCOUNTER — Encounter: Payer: Self-pay | Admitting: Emergency Medicine

## 2023-05-23 ENCOUNTER — Emergency Department
Admission: EM | Admit: 2023-05-23 | Discharge: 2023-05-23 | Disposition: A | Discharge status: Home / Self Care | Payer: MEDICAID | Attending: Emergency Medicine | Admitting: Emergency Medicine

## 2023-05-23 ENCOUNTER — Other Ambulatory Visit: Payer: Self-pay

## 2023-05-23 DIAGNOSIS — R443 Hallucinations, unspecified: Secondary | ICD-10-CM | POA: Diagnosis present

## 2023-05-23 DIAGNOSIS — F209 Schizophrenia, unspecified: Secondary | ICD-10-CM | POA: Insufficient documentation

## 2023-05-23 LAB — COMPREHENSIVE METABOLIC PANEL
ALT: 32 U/L (ref 0–44)
AST: 27 U/L (ref 15–41)
Albumin: 3.9 g/dL (ref 3.5–5.0)
Alkaline Phosphatase: 71 U/L (ref 38–126)
Anion gap: 8 (ref 5–15)
BUN: 23 mg/dL — ABNORMAL HIGH (ref 6–20)
CO2: 25 mmol/L (ref 22–32)
Calcium: 8.8 mg/dL — ABNORMAL LOW (ref 8.9–10.3)
Chloride: 106 mmol/L (ref 98–111)
Creatinine, Ser: 1.38 mg/dL — ABNORMAL HIGH (ref 0.61–1.24)
GFR, Estimated: >60 mL/min (ref >60)
Glucose, Bld: 102 mg/dL — ABNORMAL HIGH (ref 70–99)
Potassium: 3.9 mmol/L (ref 3.5–5.1)
Sodium: 139 mmol/L (ref 135–145)
Total Bilirubin: 0.6 mg/dL (ref <1.2)
Total Protein: 7.4 g/dL (ref 6.5–8.1)

## 2023-05-23 LAB — CBC
HCT: 42 % (ref 39.0–52.0)
Hemoglobin: 13.9 g/dL (ref 13.0–17.0)
MCH: 31.1 pg (ref 26.0–34.0)
MCHC: 33.1 g/dL (ref 30.0–36.0)
MCV: 94 fL (ref 80.0–100.0)
Platelets: 230 10*3/uL (ref 150–400)
RBC: 4.47 MIL/uL (ref 4.22–5.81)
RDW: 13.3 % (ref 11.5–15.5)
WBC: 9.5 10*3/uL (ref 4.0–10.5)
nRBC: 0 % (ref 0.0–0.2)

## 2023-05-23 LAB — ACETAMINOPHEN LEVEL: Acetaminophen (Tylenol), Serum: <10 ug/mL — ABNORMAL LOW (ref 10–30)

## 2023-05-23 LAB — SALICYLATE LEVEL: Salicylate Lvl: <7 mg/dL — ABNORMAL LOW (ref 7.0–30.0)

## 2023-05-23 LAB — ETHANOL: Alcohol, Ethyl (B): <10 mg/dL (ref <10)

## 2023-05-23 MED ORDER — CLONAZEPAM 0.5 MG PO TABS
0.5000 mg | ORAL_TABLET | Freq: Two times a day (BID) | ORAL | Status: DC
  Administered 2023-05-23: 0.5 mg via ORAL
  Filled 2023-05-23: qty 1

## 2023-05-23 MED ORDER — RISPERIDONE 1 MG PO TABS: 2.0000 mg | ORAL_TABLET | ORAL | Status: DC

## 2023-05-23 MED ORDER — HYDROXYZINE HCL 25 MG PO TABS: 25.0000 mg | ORAL_TABLET | Freq: Three times a day (TID) | ORAL | Status: DC | PRN

## 2023-05-23 MED ORDER — CLOZAPINE 100 MG PO TABS: 300.0000 mg | ORAL_TABLET | Freq: Every day | ORAL | Status: DC

## 2023-05-23 MED ORDER — PROPRANOLOL HCL 20 MG PO TABS: 20.0000 mg | ORAL_TABLET | Freq: Two times a day (BID) | ORAL | Status: DC

## 2023-05-23 MED ORDER — BUPROPION HCL ER (XL) 150 MG PO TB24
150.0000 mg | ORAL_TABLET | Freq: Every day | ORAL | Status: DC
  Administered 2023-05-23: 150 mg via ORAL
  Filled 2023-05-23: qty 1

## 2023-05-23 MED ORDER — OLANZAPINE 5 MG PO TBDP
10.0000 mg | ORAL_TABLET | Freq: Once | ORAL | Status: AC
Start: 2023-05-23 — End: 2023-05-23
  Administered 2023-05-23: 10 mg via ORAL
  Filled 2023-05-23: qty 2

## 2023-05-23 MED ORDER — RISPERIDONE 1 MG PO TABS: 1.0000 mg | ORAL_TABLET | ORAL | Status: DC

## 2023-05-23 MED ORDER — BENZTROPINE MESYLATE 1 MG PO TABS: 1.0000 mg | ORAL_TABLET | Freq: Two times a day (BID) | ORAL | Status: DC

## 2023-05-23 MED ORDER — LORATADINE 10 MG PO TABS
10.0000 mg | ORAL_TABLET | Freq: Every day | ORAL | Status: DC
  Administered 2023-05-23: 10 mg via ORAL
  Filled 2023-05-23: qty 1

## 2023-05-23 NOTE — ED Provider Notes (Signed)
-----------------------------------------   2:51 PM on 05/23/2023 -----------------------------------------   Blood pressure 123/81, pulse 73, temperature 98.4 F (36.9 C), temperature source Oral, resp. rate 18, weight 85 kg, SpO2 96%.  The patient is calm and cooperative at this time.  There have been no acute events since the last update.  Patient evaluated by psychiatry team.  Deemed safe for discharge and will discharge at this time.   Janith Lima, MD 05/23/23 1452

## 2023-05-23 NOTE — ED Notes (Signed)
Pt offered a cab voucher and he refuses and states he wants his care team member to pick him up.

## 2023-05-23 NOTE — ED Notes (Addendum)
Breakfast tray given, pt declined tray at this time, stated he had a cold [place] sandwich last night and didn't eat it.

## 2023-05-23 NOTE — ED Notes (Addendum)
Pt belongings:  White shoes Black socks Black pants Black and white t shirt Black and Facilities manager Black jacket Blue jeans Grey shorts $50 and some change Blue earphones

## 2023-05-23 NOTE — ED Notes (Signed)
Pt given a sandwich tray and sprite.

## 2023-05-23 NOTE — ED Notes (Signed)
NP at the bedside for pt reassessment

## 2023-05-23 NOTE — ED Notes (Signed)
Pt offered his medications. Pt refused and states he will take his medications when he gets home.

## 2023-05-23 NOTE — ED Provider Notes (Signed)
[facility] Provider Note    Event Date/Time   First MD Initiated Contact with Patient 05/23/23 0125     (approximate)   History   Psychiatric Evaluation   HPI  Anthony Luna is a 37 y.o. male who presents to the ED for evaluation of Psychiatric Evaluation   Review of psychiatric DC summary from 5 days ago.  History of schizophrenia  Patient presents to the ED voluntarily because he is "scared for my mind."  Denies any suicidal thoughts.  Refuses to provide much history.  Reports he is undomiciled.   Physical Exam   Triage Vital Signs: ED Triage Vitals  Encounter Vitals Group     BP 05/23/23 0054 (!) 144/94     Systolic BP Percentile --      Diastolic BP Percentile --      Pulse Rate 05/23/23 0054 (!) 103     Resp 05/23/23 0054 19     Temp 05/23/23 0054 98.2 F (36.8 C)     Temp Source 05/23/23 0054 Oral     SpO2 05/23/23 0054 96 %     Weight 05/23/23 0107 187 lb 6.3 oz (85 kg)     Height --      Head Circumference --      Peak Flow --      Pain Score 05/23/23 0107 0     Pain Loc --      Pain Education --      Exclude from Growth Chart --     Most recent vital signs: Vitals:   05/23/23 0054  BP: (!) 144/94  Pulse: (!) 103  Resp: 19  Temp: 98.2 F (36.8 C)  SpO2: 96%    General: Awake, no distress.  CV:  Good peripheral perfusion.  Resp:  Normal effort.  Abd:  No distention.  MSK:  No deformity noted.  Neuro:  No focal deficits appreciated. Other:     ED Results / Procedures / Treatments   Labs (all labs ordered are listed, but only abnormal results are displayed) Labs Reviewed  COMPREHENSIVE METABOLIC PANEL - Abnormal; Notable for the following components:      Result Value   Glucose, Bld 102 (*)    BUN 23 (*)    Creatinine, Ser 1.38 (*)    Calcium 8.8 (*)    All other components within normal limits  SALICYLATE LEVEL - Abnormal; Notable for the following components:   Salicylate Lvl <7.0 (*)    All  other components within normal limits  ACETAMINOPHEN LEVEL - Abnormal; Notable for the following components:   Acetaminophen (Tylenol), Serum <10 (*)    All other components within normal limits  CBC  ETHANOL  URINE DRUG SCREEN, QUALITATIVE ([facility] ONLY)    EKG   RADIOLOGY   Official radiology report(s): No results found.  PROCEDURES and INTERVENTIONS:  Procedures  Medications  OLANZapine zydis (ZYPREXA) disintegrating tablet 10 mg (has no administration in time range)     IMPRESSION / MDM / ASSESSMENT AND PLAN / ED COURSE  I reviewed the triage vital signs and the nursing notes.  Differential diagnosis includes, but is not limited to, malingering, psychoses, polysubstance abuse  {Patient presents with symptoms of an acute illness or injury that is potentially life-threatening.  Patient presents to the ED with vague symptoms.  We will have psychiatry evaluate the patient.  Strong suspicion for malingering.  No evidence of medical pathology to preclude psychiatric evaluation.  Clinical Course as of 05/23/23  0981  Sat May 23, 2023  0202 The patient has been placed in psychiatric observation due to the need to provide a safe environment for the patient while obtaining psychiatric consultation and evaluation, as well as ongoing medical and medication management to treat the patient's condition.  The patient has not been placed under full IVC at this time.   [DS]    Clinical Course User Index [DS] Delton Prairie, MD     FINAL CLINICAL IMPRESSION(S) / ED DIAGNOSES   Final diagnoses:  Hallucinations     Rx / DC Orders   ED Discharge Orders     None        Note:  This document was prepared using Dragon voice recognition software and may include unintentional dictation errors.   Delton Prairie, MD 05/23/23 [phone]

## 2023-05-23 NOTE — Consult Note (Signed)
The client was evaluated last night and request to reassess today for possible discharge.  Anthony Luna was calm and cooperative on assessment this morning.  His care team was notified, Anthony Luna, and she needed to check with her supervisor prior to him discharging.  He is his own guardian and has his own place to live, does not live in a group home.  He was upset according to his care team about his apartment being messy because he threw things around prior to his admission last week, discharged on Monday, December 2nd.  Anthony Luna stated he was doing fine yesterday when they visited him and he told them he planned to clean up his mess, did not seem overly concerned about it.  Meanwhile, Anthony Luna wanted to leave and was getting frustrated per his RN.  The care team called back with no answer.  Later, Anthony Luna was reached and she will visit him after he is discharged.  She was unable to come and take him home so the RN obtained a cab voucher as Anthony Luna was tired of waiting.  RN chatted regularly about him.  Anthony Luna, PMHNP

## 2023-05-23 NOTE — ED Notes (Signed)
Pt seen pacing in room. Appears irritated. When asked if this nurse can do anything for pt he states "I don't understand why I am still here. I am ready to go." Pt informed that this nurse would call NP for update to see if they were able to get in touch with his care team and let him go home. Pt now sitting on side of bed. Pt again offered all his medications and pt states he has his medications at home and does not want to take them in the ED.

## 2023-05-23 NOTE — ED Notes (Signed)
Voluntary /Re assess this am and d/c back to the group home if he remains psych stable during overnight observation

## 2023-05-23 NOTE — Consult Note (Addendum)
Telepsych Consultation   Reason for Consult:  Psych evaluation Referring Physician:  Dr. Katrinka Blazing Location of Patient: [facility] ER Location of Provider: Behavioral Health TTS Department  Patient Identification: Anthony Luna MRN:  [id number] Principal Diagnosis: Hallucinations Diagnosis:  Principal Problem:   Hallucinations Active Problems:   Schizophrenia (HCC)   Total Time spent with patient: 30 minutes  Subjective:   "Im still hearing voices"  HPI:  Tele psych Assessment   Anthony Luna, 37 y.o., male patient seen via tele health by TTS and this provider; chart reviewed and consulted with Dr. Katrinka Blazing on 05/23/23.  Per chart review, Patient was seen and assessed here on 05/09/2023 where he was recommended for inpatient based on symptoms.  He was discharged on December 3, to a group home according to the discharge summary. On evaluation Anthony Luna reports that he started to hear voices again. He says they were telling him to have sex without a condom. He states that he is here today because he does not live at the group home which is reported on his discharge summary but that his still live at home which he says is trashed.  He says that the police is always being called on him every time he appears at certain locations.  He admits to having an ACT team now and was educated on how to utilize their services tonight.  Patient has a case manager Anthony Luna with [facility].    During evaluation Anthony Luna is laying in bed, he is pleasant and easily engaged in the assessment on approach.   He is alert/oriented x 4; calm/cooperative; and mood congruent with affect.  Patient is speaking in a clear tone at moderate volume, and normal pace; with good eye contact.  He get a little frustrated after being after a couple of questions and yells out "damn" then quickly says "I'm sorry miss Anthony Luna."  He then states that he would like to get some sleep if I dont have  anymore questions for him.  His  thought process is coherent and relevant; There is no indication that he  is currently responding to internal/external stimuli or experiencing delusional thought content.  Patient denies suicidal/self-harm/homicidal ideation, psychosis, and paranoia.  Patient has remained calm throughout assessment and has answered questions appropriately.      Recommendations: Reassess in the AM and discharge back to the group home if he remains psychiatrically stable during overnight observation.    Dr. Katrinka Blazing informed of above recommendation and disposition  Past Psychiatric History: Schizophrenia  Risk to Self:   Risk to Others:   Prior Inpatient Therapy:   Prior Outpatient Therapy:    Past Medical History:  Past Medical History:  Diagnosis Date   Depression    Since moving into new apartment 1 year ago   None to low serum cortisol response with adrenocorticotrophic hormone (ACTH) stimulation test    Schizophrenia, paranoid type (HCC)    History reviewed. No pertinent surgical history. Family History: History reviewed. No pertinent family history. Family Psychiatric  History: unknown Social History:  Social History   Substance and Sexual Activity  Alcohol Use Yes   Alcohol/week: 0.0 standard drinks of alcohol   Comment: "Special occasions"     Social History   Substance and Sexual Activity  Drug Use Yes   Frequency: 4.0 times per week   Types: Marijuana   Comment: "2 blunts"    Social History   Socioeconomic History   Marital status: Single  Spouse name: Not on file   Number of children: Not on file   Years of education: Not on file   Highest education level: Not on file  Occupational History   Not on file  Tobacco Use   Smoking status: Every Day    Current packs/day: 0.50    Average packs/day: 0.5 packs/day for 20.5 years (10.2 ttl pk-yrs)    Types: Cigarettes    Start date: 11/29/2002   Smokeless tobacco: Former   Tobacco comments:     does not want to quit  Vaping Use   Vaping status: Never Used  Substance and Sexual Activity   Alcohol use: Yes    Alcohol/week: 0.0 standard drinks of alcohol    Comment: "Special occasions"   Drug use: Yes    Frequency: 4.0 times per week    Types: Marijuana    Comment: "2 blunts"   Sexual activity: Not on file  Other Topics Concern   Not on file  Social History Narrative   Not on file   Social Determinants of Health   Financial Resource Strain: Not on file  Food Insecurity: Food Insecurity Present (05/10/2023)   Hunger Vital Sign    Worried About Running Out of Food in the Last Year: Sometimes true    Ran Out of Food in the Last Year: Often true  Transportation Needs: Unmet Transportation Needs (05/10/2023)   PRAPARE - Administrator, Civil Service (Medical): Yes    Lack of Transportation (Non-Medical): Yes  Physical Activity: Not on file  Stress: Not on file  Social Connections: Not on file   Additional Social History:    Allergies:   Allergies  Allergen Reactions   Shellfish Allergy     Labs:  Results for orders placed or performed during the hospital encounter of 05/23/23 (from the past 48 hour(s))  Comprehensive metabolic panel     Status: Abnormal   Collection Time: 05/23/23 12:55 AM  Result Value Ref Range   Sodium 139 135 - 145 mmol/L   Potassium 3.9 3.5 - 5.1 mmol/L   Chloride 106 98 - 111 mmol/L   CO2 25 22 - 32 mmol/L   Glucose, Bld 102 (H) 70 - 99 mg/dL    Comment: Glucose reference range applies only to samples taken after fasting for at least 8 hours.   BUN 23 (H) 6 - 20 mg/dL   Creatinine, Ser 4.09 (H) 0.61 - 1.24 mg/dL   Calcium 8.8 (L) 8.9 - 10.3 mg/dL   Total Protein 7.4 6.5 - 8.1 g/dL   Albumin 3.9 3.5 - 5.0 g/dL   AST 27 15 - 41 U/L   ALT 32 0 - 44 U/L   Alkaline Phosphatase 71 38 - 126 U/L   Total Bilirubin 0.6 <1.2 mg/dL   GFR, Estimated >81 >19 mL/min    Comment: (NOTE) Calculated using the CKD-EPI Creatinine Equation  (2021)    Anion gap 8 5 - 15    Comment: Performed at [facility], [address] [place], [place] [address]  Ethanol     Status: None   Collection Time: 05/23/23 12:55 AM  Result Value Ref Range   Alcohol, Ethyl (B) <10 <10 mg/dL    Comment: (NOTE) Lowest detectable limit for serum alcohol is 10 mg/dL.  For medical purposes only. Performed at [facility], [address], [place], [place] [address]   Salicylate level     Status: Abnormal   Collection Time: 05/23/23 12:55 AM  Result Value Ref Range   Salicylate Lvl <7.0 (L) 7.0 - 30.0 mg/dL    Comment: Performed at [facility], [address] [place], [place] [address]  Acetaminophen level     Status: Abnormal   Collection Time: 05/23/23 12:55 AM  Result Value Ref Range   Acetaminophen (Tylenol), Serum <10 (L) 10 - 30 ug/mL    Comment: (NOTE) Therapeutic concentrations vary significantly. A range of 10-30 ug/mL  may be an effective concentration for many patients. However, some  are best treated at concentrations outside of this range. Acetaminophen concentrations >150 ug/mL at 4 hours after ingestion  and >50 ug/mL at 12 hours after ingestion are often associated with  toxic reactions.  Performed at [facility], [address] [place], [place] [address]   cbc     Status: None   Collection Time: 05/23/23 12:55 AM  Result Value Ref Range   WBC 9.5 4.0 - 10.5 K/uL   RBC 4.47 4.22 - 5.81 MIL/uL   Hemoglobin 13.9 13.0 - 17.0 g/dL   HCT 01.0 27.2 - 53.6 %   MCV 94.0 80.0 - 100.0 fL   MCH 31.1 26.0 - 34.0 pg   MCHC 33.1 30.0 - 36.0 g/dL   RDW 64.4 03.4 - 74.2 %   Platelets 230 150 - 400 K/uL   nRBC 0.0 0.0 - 0.2 %    Comment: Performed at [facility], [address] [place], [place] [address]    Medications:  No current facility-administered medications for this encounter.   Current Outpatient Medications  Medication Sig Dispense Refill   albuterol (VENTOLIN  HFA) 108 (90 Base) MCG/ACT inhaler Inhale 1-2 puffs into the lungs every 6 (six) hours as needed for shortness of breath. 18 g 1   benztropine (COGENTIN) 0.5 MG tablet Take 2 tablets (1 mg total) by mouth 2 (two) times daily. 60 tablet 1   buPROPion (WELLBUTRIN XL) 150 MG 24 hr tablet Take 1 tablet (150 mg total) by mouth daily. 30 tablet 1   cetirizine (ZYRTEC) 10 MG tablet Take 1 tablet (10 mg total) by mouth daily. 30 tablet 1   clonazePAM (KLONOPIN) 0.5 MG tablet Take 1 tablet (0.5 mg total) by mouth 2 (two) times daily. 60 tablet 1   cloZAPine (CLOZARIL) 100 MG tablet Take 300 mg by mouth at bedtime. Take along with one 50 mg tablet for total 350 mg at bedtime     cloZAPine (CLOZARIL) 50 MG tablet Take 7 tablets (350 mg total) by mouth at bedtime. (Patient not taking: Reported on 04/27/2023) 210 tablet 0   docusate sodium (COLACE) 100 MG capsule Take 1 capsule (100 mg total) by mouth 2 (two) times daily. 60 capsule 1   fluticasone (FLONASE) 50 MCG/ACT nasal spray Place 2 sprays into both nostrils daily as needed for allergies or rhinitis. 16 g 1   hydrOXYzine (ATARAX) 25 MG tablet Take 1 tablet (25 mg total) by mouth 3 (three) times daily as needed for anxiety. 30 tablet 1   melatonin 5 MG TABS Take 1 tablet (5 mg total) by mouth at bedtime. (Patient taking differently: Take 3 mg by mouth at bedtime.) 30 tablet 1   montelukast (SINGULAIR) 10 MG tablet Take 1 tablet (10 mg total) by mouth at bedtime. 30 tablet 1   nicotine (NICODERM CQ - DOSED IN MG/24 HOURS) 14 mg/24hr patch Place 1 patch (14 mg total) onto the skin daily for 28 days. 28 patch 0   nicotine (NICODERM CQ -  DOSED IN MG/24 HOURS) 21 mg/24hr patch Place 1 patch (21 mg total) onto the skin daily. 28 patch 1   OLANZapine (ZYPREXA) 10 MG tablet Take 1 tablet (10 mg total) by mouth every 6 (six) hours as needed (Psychosis). 30 tablet 0   Paliperidone Palmitate (INVEGA TRINZA) 819 MG/2.625ML SUSP Inject 819 mg into the muscle.      propranolol (INDERAL) 20 MG tablet Take 1 tablet (20 mg total) by mouth 2 (two) times daily. 60 tablet 0   risperiDONE (RISPERDAL) 1 MG tablet Take 1 tablet (1 mg total) by mouth 2 (two) times daily at 8 am and 4 pm. 60 tablet 0   traZODone (DESYREL) 50 MG tablet Take 1 tablet (50 mg total) by mouth at bedtime as needed for sleep. 30 tablet 0   Vitamin D, Ergocalciferol, (DRISDOL) 1.25 MG (50000 UNIT) CAPS capsule Take 1 capsule (50,000 Units total) by mouth every 7 (seven) days. (Patient not taking: Reported on 04/27/2023) 5 capsule 1   Vitamin D, Ergocalciferol, (DRISDOL) 1.25 MG (50000 UNIT) CAPS capsule Take 1 capsule (50,000 Units total) by mouth every 7 (seven) days for 5 doses. 5 capsule 0    Musculoskeletal: Strength & Muscle Tone: within normal limits Gait & Station: normal Patient leans: N/A  Psychiatric Specialty Exam:  Presentation  General Appearance:  Appropriate for Environment; Neat  Eye Contact: Good  Speech: Clear and Coherent; Normal Rate  Speech Volume: Normal  Handedness: Right   Mood and Affect  Mood: Euthymic  Affect: Appropriate   Thought Process  Thought Processes: Coherent  Descriptions of Associations:Intact  Orientation:Full (Time, Place and Person) (and situation)  Thought Content:WDL  History of Schizophrenia/Schizoaffective disorder:Yes  Duration of Psychotic Symptoms:Greater than six months  Hallucinations:AH Ideas of Reference:Other (comment)  Suicidal Thoughts:No Homicidal Thoughts:No  Sensorium  Memory: Immediate Good; Remote Good  Judgment: Fair  Insight: Fair   Art therapist  Concentration: Good  Attention Span: Good; Fair  Recall: Good  Fund of Knowledge: Good  Language: Good   Psychomotor Activity  Psychomotor Activity:WNL  Assets  Assets: Housing; Manufacturing systems engineer; Financial Resources/Insurance   Sleep  Sleep:good   Physical Exam: Physical Exam Vitals and nursing note  reviewed.  Constitutional:      Appearance: Normal appearance.  HENT:     Head: Normocephalic and atraumatic.     Nose: Nose normal.  Eyes:     Extraocular Movements: Extraocular movements intact.     Pupils: Pupils are equal, round, and reactive to light.  Pulmonary:     Effort: Pulmonary effort is normal.  Musculoskeletal:        General: Normal range of motion.     Cervical back: Normal range of motion.  Skin:    General: Skin is warm and dry.  Neurological:     Mental Status: He is alert and oriented to person, place, and time.  Psychiatric:        Attention and Perception: Attention and perception normal.        Mood and Affect: Mood and affect normal.        Speech: Speech normal.        Behavior: Behavior normal. Behavior is cooperative.        Thought Content: Thought content normal. Thought content does not include homicidal or suicidal ideation. Thought content does not include homicidal or suicidal plan.        Cognition and Memory: Cognition and memory normal.        Judgment: Judgment is  impulsive.    Review of Systems  Psychiatric/Behavioral:  Positive for hallucinations. Negative for depression, substance abuse and suicidal ideas.   All other systems reviewed and are negative.  Blood pressure (!) 144/94, pulse (!) 103, temperature 98.2 F (36.8 C), temperature source Oral, resp. rate 19, weight 85 kg, SpO2 96%. Body mass index is 25.41 kg/m.   Disposition: No evidence of imminent risk to self or others at present.   Supportive therapy provided about ongoing stressors. Refer to IOP. Discussed crisis plan, support from social network, calling 911, coming to the Emergency Department, and calling Suicide Hotline.  This service was provided via telemedicine using a 2-way, interactive audio and video technology.     Anthony Lesch, NP 05/23/2023 3:19 AM

## 2023-05-23 NOTE — ED Notes (Signed)
Pt came out of room and asked for his breakfast tray. Pt given breakfast tray.

## 2023-05-23 NOTE — ED Notes (Addendum)
Pt was asked by this nurse to call his care team member [name]. Pt states he will not call her but is willing to take a cab home. Pt appears calm and cooperative at this time.

## 2023-05-23 NOTE — ED Notes (Signed)
Pt constantly running back and forth from room to bathroom. This tech and officer heard water running in bathroom. Pt had cut on shower in bathroom. This tech re-directed pt back to his room and changed out wet clothing for new dry clothing. Pt was offered shower but declined.

## 2023-05-23 NOTE — BH Assessment (Signed)
Comprehensive Clinical Assessment (CCA) Screening, Triage and Referral Note  05/23/2023 Anthony Luna [id number] Recommendations for Services/Supports/Treatments: Consulted with Rashaun D., NP, who recommended pt. for continued observation and reassessment in the AM.   Anthony Luna is a 37 year old, English speaking, Black male with a hx of Schizoaffective Disorder, Bipolar Type and cannabis use disorder, severe dependence. Pt presented to [facility] ED VOL. Per triage note: Patient reports hearing voices x 20 years, worse over the past few days.     On assessment, pt. presented with clear, linear speech. Pt's thoughts were relevant to the questions asked. When asked what brought pt. to the ED the pt. stated, "I was hearing voices telling me to have sex without a condom." Pt had limited insight and poor judgment. Pt reported that he is harassed by police whenever he returns to his old apartment; pt. reported that he both lives at a group home and still lives at his old apartment. Upon being asked a series of questions, pt. had both a resistant and silly demeanor. Pt would express frustration with questions, followed by inappropriate laughter. Pt had a euphoric affect, and a labile mood. Pt denied current SI, HI; however, pt. admitted to feeling depressed and lonely walking the streets prior to arrival. Pt endorsed having worsening AV/H. BAL is unremarkable; pt. admitted to using cannabis prior to arrival.    Chief Complaint:  Chief Complaint  Patient presents with   Psychiatric Evaluation   Visit Diagnosis: Schizophrenia [facility])   Patient Reported Information How did you hear about [place]? Self  What Is the Reason for Your Visit/Call Today? Pt presents voluntarily with case manager [name] [phone]) with community support team of [place]. Pt reportedly has been wandering the streets, sleeping outside, having erratic behaviors. Pt endorses suicidal ideation with plan to "get a  knife". Pt reports auditory hallucinations, not command in nature.  How Long Has This Been Causing You Problems? > than 6 months  What Do You Feel Would Help You the Most Today? Social Support; Patent examiner; Food Assistance   Have You Recently Had Any Thoughts About Hurting Yourself? Yes  Are You Planning to Commit Suicide/Harm Yourself At This time? No   Have you Recently Had Thoughts About Hurting Someone Anthony Luna? No  Are You Planning to Harm Someone at This Time? No  Explanation: Pt reported that he currently feels better; however prior to arrival he'd had thoughts to stand on the train tracks.   Have You Used Any Alcohol or Drugs in the Past 24 Hours? No  How Long Ago Did You Use Drugs or Alcohol? No data recorded What Did You Use and How Much? Pt denied   Do You Currently Have a Therapist/Psychiatrist? No  Name of Therapist/Psychiatrist: Unable to recall   Have You Been Recently Discharged From Any Office Practice or Programs? Yes  Explanation of Discharge From Practice/Program: Pt recently discharged from [facility].    CCA Screening Triage Referral Assessment Type of Contact: Face-to-Face  Telemedicine Service Delivery:   Is this Initial or Reassessment?   Date Telepsych consult ordered in [facility]:    Time Telepsych consult ordered in [facility]:    Location of Assessment: [facility] ED  Provider Location: [facility] ED    Collateral Involvement: None provided   Does Patient Have a Court Appointed Legal Guardian? No data recorded Name and Contact of Legal Guardian: No data recorded If Minor and Not Living with Parent(s), Who has Custody? n/a  Is CPS involved or  ever been involved? Never  Is APS involved or ever been involved? Never   Patient Determined To Be At Risk for Harm To Self or Others Based on Review of Patient Reported Information or Presenting Complaint? No  Method: No Plan  Availability of Means: No access or NA  Intent: Vague intent or  NA  Notification Required: No need or identified person  Additional Information for Danger to Others Potential: Active psychosis  Additional Comments for Danger to Others Potential: n/a  Are There Guns or Other Weapons in Your Home? No  Types of Guns/Weapons: n/a  Are These Weapons Safely Secured?                            -- (n/a)  Who Could Verify You Are Able To Have These Secured: n/a  Do You Have any Outstanding Charges, Pending Court Dates, Parole/Probation? None reported  Contacted To Inform of Risk of Harm To Self or Others: Other: Comment   Does Patient Present under Involuntary Commitment? No    County of Residence: [place]   Patient Currently Receiving the Following Services: Not Receiving Services   Determination of Need: Emergent (2 hours)   Options For Referral: Inpatient Hospitalization; ED Visit; ED Referral   Discharge Disposition:     Armand Preast R Galilee Pierron, LCAS

## 2023-05-23 NOTE — ED Triage Notes (Signed)
Patient reports hearing voices x 20 years, worse over the past few days. Patient denies SI/HI.

## 2023-05-23 NOTE — ED Notes (Signed)
Pt seen pacing in room. When asked if something was bothering him pt states "I am ready to go home". Reassured the psych team was working on his plan of care and would let him know something as soon as they had more information about what was next for him. Pt verbalized understanding.

## 2023-05-23 NOTE — Progress Notes (Signed)
Pharmacy - Clozapine     This patient's order has been reviewed for prescribing contraindications.   Clozapine REMS enrollment Verified: [id number] (verified 05/23/2023) Current Outpatient Monitoring: monthly  Home Regimen:  Clozapine 300mg  at bedtime - last dose 12/6  Dose Adjustments This Admission: Resume home dose of 300mg  qHS  Labs:  11/12 (prior to admission): ANC 5800  Plan: CBC with diff ordered for today. Continue clozapine as ordered, then Continue with weekly ANC labs while inpatient  **The medication is being dispensed pursuant to the FDA REMS suspension order of 05/04/20 that allows for dispensing without a patient REMS dispense authorization (RDA).   Delwyn Scoggin Rodriguez-Guzman PharmD, BCPS 05/23/2023 1:07 PM

## 2023-05-23 NOTE — ED Notes (Signed)
Pt agrees to take his medications prior to discharge. Alert and calm at this time. Pt apologized  for getting upset about his prolonged stay in the ED.

## 2023-06-01 ENCOUNTER — Emergency Department
Admission: EM | Admit: 2023-06-01 | Discharge: 2023-06-01 | Disposition: A | Discharge status: Home / Self Care | Payer: MEDICAID | Attending: Emergency Medicine | Admitting: Emergency Medicine

## 2023-06-01 DIAGNOSIS — Z5900 Homelessness unspecified: Secondary | ICD-10-CM | POA: Insufficient documentation

## 2023-06-01 DIAGNOSIS — Z760 Encounter for issue of repeat prescription: Secondary | ICD-10-CM | POA: Insufficient documentation

## 2023-06-01 MED ORDER — HYDROXYZINE HCL 25 MG PO TABS: 25.0000 mg | ORAL_TABLET | Freq: Three times a day (TID) | ORAL | 1 refills | Status: AC | PRN

## 2023-06-01 MED ORDER — PALIPERIDONE PALMITATE 819 MG/2.625ML IM SUSP: 819.0000 mg | INTRAMUSCULAR | 0 refills | Status: AC

## 2023-06-01 MED ORDER — MELATONIN 5 MG PO TABS: 5.0000 mg | ORAL_TABLET | Freq: Every day | ORAL | 1 refills | Status: AC

## 2023-06-01 MED ORDER — CETIRIZINE HCL 10 MG PO TABS: 10.0000 mg | ORAL_TABLET | Freq: Every day | ORAL | 1 refills | Status: AC

## 2023-06-01 MED ORDER — DOCUSATE SODIUM 100 MG PO CAPS: 100.0000 mg | ORAL_CAPSULE | Freq: Two times a day (BID) | ORAL | 1 refills | Status: AC

## 2023-06-01 MED ORDER — PROPRANOLOL HCL 20 MG PO TABS: 20.0000 mg | ORAL_TABLET | Freq: Two times a day (BID) | ORAL | 0 refills | Status: AC

## 2023-06-01 MED ORDER — TRAZODONE HCL 50 MG PO TABS: 50.0000 mg | ORAL_TABLET | Freq: Every evening | ORAL | 0 refills | Status: AC | PRN

## 2023-06-01 MED ORDER — FLUTICASONE PROPIONATE 50 MCG/ACT NA SUSP: 2.0000 | Freq: Every day | NASAL | 1 refills | Status: AC | PRN

## 2023-06-01 MED ORDER — MONTELUKAST SODIUM 10 MG PO TABS: 10.0000 mg | ORAL_TABLET | Freq: Every day | ORAL | 1 refills | Status: AC

## 2023-06-01 MED ORDER — VITAMIN D (ERGOCALCIFEROL) 1.25 MG (50000 UNIT) PO CAPS: 50000.0000 [IU] | ORAL_CAPSULE | ORAL | 0 refills | Status: AC

## 2023-06-01 MED ORDER — RISPERIDONE 1 MG PO TABS: 1.0000 mg | ORAL_TABLET | ORAL | 0 refills | Status: AC

## 2023-06-01 NOTE — ED Triage Notes (Signed)
Pt states that he is homeless, is it cold and he wants his meds but does not know which ones

## 2023-06-01 NOTE — ED Notes (Signed)
Pt here for Bilateral foot pain. VSS.

## 2023-06-01 NOTE — ED Provider Notes (Signed)
[facility] Provider Note   Event Date/Time   First MD Initiated Contact with Patient 06/01/23 2111     (approximate) History  Medication Refill  HPI Anthony Luna is a 37 y.o. male who presents today for medication refill.  Patient also complains of being homeless and it getting colder outside with the need for shelter. ROS: Patient currently denies any vision changes, tinnitus, difficulty speaking, facial droop, sore throat, chest pain, shortness of breath, abdominal pain, nausea/vomiting/diarrhea, dysuria, or weakness/numbness/paresthesias in any extremity   Physical Exam  Triage Vital Signs: ED Triage Vitals [06/01/23 2008]  Encounter Vitals Group     BP 124/84     Systolic BP Percentile      Diastolic BP Percentile      Pulse Rate (!) 105     Resp 20     Temp 98.2 F (36.8 C)     Temp Source Oral     SpO2 100 %     Weight      Height      Head Circumference      Peak Flow      Pain Score 0     Pain Loc      Pain Education      Exclude from Growth Chart    Most recent vital signs: Vitals:   06/01/23 2008  BP: 124/84  Pulse: (!) 105  Resp: 20  Temp: 98.2 F (36.8 C)  SpO2: 100%   General: Awake, oriented x4. CV:  Good peripheral perfusion.  Resp:  Normal effort.  Abd:  No distention.  Other:  Middle-aged well-developed, well-nourished African-American male resting comfortably in no acute distress ED Results / Procedures / Treatments  Labs (all labs ordered are listed, but only abnormal results are displayed) Labs Reviewed - No data to display PROCEDURES: Critical Care performed: No Procedures MEDICATIONS ORDERED IN ED: Medications - No data to display IMPRESSION / MDM / ASSESSMENT AND PLAN / ED COURSE  I reviewed the triage vital signs and the nursing notes.                             The patient is on the cardiac monitor to evaluate for evidence of arrhythmia and/or significant heart rate changes. Patient's  presentation is most consistent with acute presentation with potential threat to life or bodily function. Patient is a 37 year old male with normal physical exam who presents for medication refill and resources for housing.  Medication was refilled at bedside with patient's input and patient was discharged with resources for outpatient shelters.  The patient has been reexamined and is ready to be discharged.  All diagnostic results have been reviewed and discussed with the patient/family.  Care plan has been outlined and the patient/family understands all current diagnoses, results, and treatment plans.  There are no new complaints, changes, or physical findings at this time.  All questions have been addressed and answered.  Patient was instructed to, and agrees to follow-up with their primary care physician as well as return to the emergency department if any new or worsening symptoms develop.   FINAL CLINICAL IMPRESSION(S) / ED DIAGNOSES   Final diagnoses:  Medication refill   Rx / DC Orders   ED Discharge Orders          Ordered    cetirizine (ZYRTEC) 10 MG tablet  Daily        06/01/23 2124    docusate  sodium (COLACE) 100 MG capsule  2 times daily        06/01/23 2124    fluticasone (FLONASE) 50 MCG/ACT nasal spray  Daily PRN        06/01/23 2124    hydrOXYzine (ATARAX) 25 MG tablet  3 times daily PRN        06/01/23 2124    melatonin 5 MG TABS  Daily at bedtime        06/01/23 2124    montelukast (SINGULAIR) 10 MG tablet  Daily at bedtime        06/01/23 2124    Paliperidone Palmitate (INVEGA TRINZA) 819 MG/2.625ML SUSP  Every 30 days        06/01/23 2124    propranolol (INDERAL) 20 MG tablet  2 times daily        06/01/23 2124    risperiDONE (RISPERDAL) 1 MG tablet  BH-q 8am and 4 pm        06/01/23 2124    traZODone (DESYREL) 50 MG tablet  At bedtime PRN        06/01/23 2124    Vitamin D, Ergocalciferol, (DRISDOL) 1.25 MG (50000 UNIT) CAPS capsule  Every 7 days         06/01/23 2124           Note:  This document was prepared using Dragon voice recognition software and may include unintentional dictation errors.   Merwyn Katos, MD 06/01/23 [phone]

## 2023-06-02 ENCOUNTER — Emergency Department: Admission: EM | Admit: 2023-06-02 | Discharge: 2023-06-02 | Discharge status: Against Medical Advice | Payer: MEDICAID

## 2023-06-02 NOTE — ED Notes (Signed)
Pt arrived with [employer] officer who sts pt request to come to ED. Pt reports he lost his paperwork from earlier ED visit and that he does not have anywhere to stay. Pt ambulatory to triage room with steady gait. Writer attempting to triage by asking questions with pt refusal to answer. Pt asked if he wanted to be seen and shakes his head. Pt asked if he was leaving due to gathering his belongings and pt sts, Yes." Blood pressure cuff attached to pt still. Writer attempted to remove when pt became aggressive and boisterous toward Clinical research associate. Security called to assist. Pt out to lobby area where RN instructed that due to him not staying or wanting to be seen, he could not stay in lobby and would need to call for a ride. At time, pt went into lobby, sat beside other pts in a close manor to where pt was observed invading others personal space. Pt was asked to leave due to his behavior. [employer] called and in WR to assist due to refusal of pt and continued aggressive and inappropriate behavior.   AC called due to need for [employer] and trespass

## 2023-06-03 ENCOUNTER — Emergency Department
Admission: EM | Admit: 2023-06-03 | Discharge: 2023-06-05 | Disposition: A | Discharge status: Other Acute Inpatient Hospital | Payer: MEDICAID | Attending: Emergency Medicine | Admitting: Emergency Medicine

## 2023-06-03 ENCOUNTER — Other Ambulatory Visit: Payer: Self-pay

## 2023-06-03 DIAGNOSIS — F209 Schizophrenia, unspecified: Secondary | ICD-10-CM | POA: Diagnosis present

## 2023-06-03 LAB — CBC
HCT: 41.4 % (ref 39.0–52.0)
Hemoglobin: 13.7 g/dL (ref 13.0–17.0)
MCH: 30.9 pg (ref 26.0–34.0)
MCHC: 33.1 g/dL (ref 30.0–36.0)
MCV: 93.2 fL (ref 80.0–100.0)
Platelets: 231 10*3/uL (ref 150–400)
RBC: 4.44 MIL/uL (ref 4.22–5.81)
RDW: 14.1 % (ref 11.5–15.5)
WBC: 10.9 10*3/uL — ABNORMAL HIGH (ref 4.0–10.5)
nRBC: 0 % (ref 0.0–0.2)

## 2023-06-03 LAB — COMPREHENSIVE METABOLIC PANEL
ALT: 56 U/L — ABNORMAL HIGH (ref 0–44)
AST: 109 U/L — ABNORMAL HIGH (ref 15–41)
Albumin: 4.3 g/dL (ref 3.5–5.0)
Alkaline Phosphatase: 64 U/L (ref 38–126)
Anion gap: 9 (ref 5–15)
BUN: 20 mg/dL (ref 6–20)
CO2: 24 mmol/L (ref 22–32)
Calcium: 9.6 mg/dL (ref 8.9–10.3)
Chloride: 103 mmol/L (ref 98–111)
Creatinine, Ser: 1.2 mg/dL (ref 0.61–1.24)
GFR, Estimated: >60 mL/min (ref >60)
Glucose, Bld: 97 mg/dL (ref 70–99)
Potassium: 3.8 mmol/L (ref 3.5–5.1)
Sodium: 136 mmol/L (ref 135–145)
Total Bilirubin: 1.2 mg/dL — ABNORMAL HIGH (ref <1.2)
Total Protein: 7.4 g/dL (ref 6.5–8.1)

## 2023-06-03 LAB — ETHANOL: Alcohol, Ethyl (B): <10 mg/dL (ref <10)

## 2023-06-03 LAB — SALICYLATE LEVEL: Salicylate Lvl: <7 mg/dL — ABNORMAL LOW (ref 7.0–30.0)

## 2023-06-03 LAB — ACETAMINOPHEN LEVEL: Acetaminophen (Tylenol), Serum: <10 ug/mL — ABNORMAL LOW (ref 10–30)

## 2023-06-03 MED ORDER — ONDANSETRON HCL 4 MG PO TABS: 4.0000 mg | ORAL_TABLET | Freq: Three times a day (TID) | ORAL | Status: DC | PRN

## 2023-06-03 MED ORDER — ALUM & MAG HYDROXIDE-SIMETH 200-200-20 MG/5ML PO SUSP: 30.0000 mL | Freq: Four times a day (QID) | ORAL | Status: DC | PRN

## 2023-06-03 MED ORDER — IBUPROFEN 600 MG PO TABS: 600.0000 mg | ORAL_TABLET | Freq: Three times a day (TID) | ORAL | Status: DC | PRN

## 2023-06-03 MED ORDER — ZIPRASIDONE MESYLATE 20 MG IM SOLR
20.0000 mg | Freq: Once | INTRAMUSCULAR | Status: AC
  Administered 2023-06-03: 20 mg via INTRAMUSCULAR

## 2023-06-03 NOTE — ED Notes (Signed)
Pt agreeable to having labs drawn and BP check, pt given water and warm blanket. Pt not answering questions at this time, but is cooperative.

## 2023-06-03 NOTE — ED Notes (Signed)
IVC/pending psych consult 

## 2023-06-03 NOTE — ED Notes (Signed)
Pt told that a urine specimen is needed whenever he is able to go to the restroom. Pt voiced understanding.

## 2023-06-03 NOTE — ED Notes (Signed)
Pt in room, kicking bed repeatedly even after being asked to stop, pt's bed removed and stretcher mattress placed on the floor.  Pt appears to be responding to internal stimuli.

## 2023-06-03 NOTE — ED Notes (Signed)
Patient asking for food- meal tray provided and apple juice per request

## 2023-06-03 NOTE — BH Assessment (Signed)
Comprehensive Clinical Assessment (CCA) Note  06/03/2023 Anthony Luna [id number] Recommendations for Services/Supports/Treatments: Psych NP Rashaun D. determined pt. meets psychiatric inpatient criteria. Anthony Luna is a 37 year old, English speaking, Black male with a hx of Schizoaffective Disorder, Bipolar Type and cannabis use disorder, severe dependence. Pt presented to [facility] ED with BPD under IVC.     On assessment, pt. presented with paucity of speech. Pt's thoughts were relevant to the questions asked. When asked what brought pt. to the ED the pt. stated, "I'm tired." Pt had poor insight and judgment. Pt explained that he was having thoughts of SI prior to arrival; however pt. denied current SI. Pt reported that he is medication compliant. Pt reported that he lives in his own apartment. Pt would express slight irritability with assessment questions, however pt. responded to questions by nodding/shaking his head. Pt had a dysphoric affect, and a labile mood. Pt continued to be nonresponsive with his blanket over his head as the assessment progressed.  Chief Complaint:  Chief Complaint  Patient presents with   Psychiatric Evaluation   Visit Diagnosis: Schizophrenia (HCC)     CCA Screening, Triage and Referral (STR)  Patient Reported Information How did you hear about [place]? Self  Referral name: No data recorded Referral phone number: No data recorded  Whom do you see for routine medical problems? No data recorded Practice/Facility Name: No data recorded Practice/Facility Phone Number: No data recorded Name of Contact: No data recorded Contact Number: No data recorded Contact Fax Number: No data recorded Prescriber Name: No data recorded Prescriber Address (if known): No data recorded  What Is the Reason for Your Visit/Call Today? Patient reports hearing voices x 20 years, worse over the past few days.  How Long Has This Been Causing You Problems? > than 6  months  What Do You Feel Would Help You the Most Today? Housing Assistance   Have You Recently Been in Any Inpatient Treatment (Hospital/Detox/Crisis Center/28-Day Program)? No data recorded Name/Location of Program/Hospital:No data recorded How Long Were You There? No data recorded When Were You Discharged? No data recorded  Have You Ever Received Services From [facility] Before? No data recorded Who Do You See at [facility]? No data recorded  Have You Recently Had Any Thoughts About Hurting Yourself? No  Are You Planning to Commit Suicide/Harm Yourself At This time? No   Have you Recently Had Thoughts About Hurting Someone [name]? No  Explanation: Pt denies SI/HI.   Have You Used Any Alcohol or Drugs in the Past 24 Hours? Yes  How Long Ago Did You Use Drugs or Alcohol? No data recorded What Did You Use and How Much? Pt reported using cannabis in the last 24 hours.   Do You Currently Have a Therapist/Psychiatrist? Yes  Name of Therapist/Psychiatrist: [facility]   Have You Been Recently Discharged From Any Office Practice or Programs? Yes  Explanation of Discharge From Practice/Program: Pt recently discharged from [facility].     CCA Screening Triage Referral Assessment Type of Contact: Face-to-Face  Is this Initial or Reassessment? No data recorded Date Telepsych consult ordered in [facility]:  No data recorded Time Telepsych consult ordered in [facility]:  No data recorded  Patient Reported Information Reviewed? No data recorded Patient Left Without Being Seen? No data recorded Reason for Not Completing Assessment: No data recorded  Collateral Involvement: None provided   Does Patient Have a Court Appointed Legal Guardian? No data recorded Name and Contact of Legal Guardian:  No data recorded If Minor and Not Living with Parent(s), Who has Custody? n/a  Is CPS involved or ever been involved? Never  Is APS involved or ever been involved? Never   Patient  Determined To Be At Risk for Harm To Self or Others Based on Review of Patient Reported Information or Presenting Complaint? No  Method: No Plan  Availability of Means: No access or NA  Intent: Vague intent or NA  Notification Required: No need or identified person  Additional Information for Danger to Others Potential: Active psychosis  Additional Comments for Danger to Others Potential: n/a  Are There Guns or Other Weapons in Your Home? No  Types of Guns/Weapons: n/a  Are These Weapons Safely Secured?                            No  Who Could Verify You Are Able To Have These Secured: n/a  Do You Have any Outstanding Charges, Pending Court Dates, Parole/Probation? None  Contacted To Inform of Risk of Harm To Self or Others: -- (n/a)   Location of Assessment: [facility] ED   Does Patient Present under Involuntary Commitment? No  IVC Papers Initial File Date: No data recorded  [place] of Residence:    Patient Currently Receiving the Following Services: Not Receiving Services   Determination of Need: Emergent (2 hours)   Options For Referral: ED Visit     CCA Biopsychosocial Intake/Chief Complaint:  No data recorded Current Symptoms/Problems: No data recorded  Patient Reported Schizophrenia/Schizoaffective Diagnosis in Past: Yes   Strengths: Has a support system and some insight.  Preferences: No data recorded Abilities: No data recorded  Type of Services Patient Feels are Needed: No data recorded  Initial Clinical Notes/Concerns: No data recorded  Mental Health Symptoms Depression:  Worthlessness   Duration of Depressive symptoms: Less than two weeks   Mania:  Increased Energy; Overconfidence; Recklessness; Racing thoughts   Anxiety:   Irritability; Restlessness   Psychosis:  Hallucinations   Duration of Psychotic symptoms: Greater than six months   Trauma:  N/A   Obsessions:  N/A   Compulsions:  N/A   Inattention:  N/A    Hyperactivity/Impulsivity:  N/A   Oppositional/Defiant Behaviors:  None   Emotional Irregularity:  Potentially harmful impulsivity   Other Mood/Personality Symptoms:  n/a    Mental Status Exam Appearance and self-care  Stature:  Tall   Weight:  Average weight   Clothing:  Neat/clean; Age-appropriate   Grooming:  Normal   Cosmetic use:  None   Posture/gait:  Normal   Motor activity:  -- (n/a)   Sensorium  Attention:  Distractible   Concentration:  Variable   Orientation:  Situation; Place; Person; Object   Recall/memory:  Normal   Affect and Mood  Affect:  Full Range   Mood:  Euthymic   Relating  Eye contact:  Normal   Facial expression:  Responsive   Attitude toward examiner:  Cooperative   Thought and Language  Speech flow: Pressured; Flight of Ideas   Thought content:  Appropriate to Mood and Circumstances   Preoccupation:  None   Hallucinations:  None   Organization:  No data recorded  [employer] of Knowledge:  Fair   Intelligence:  Average   Abstraction:  Functional   Judgement:  Impaired   Reality Testing:  Distorted   Insight:  Fair   Decision Making:  Impulsive   Social Functioning  Social  Maturity:  Impulsive; Irresponsible   Social Judgement:  "Chief of Staff"; Heedless   Stress  Stressors:  Transitions; Financial   Coping Ability:  Exhausted; Overwhelmed   Skill Deficits:  Decision making   Supports:  Family; Friends/Service system; Support needed     Religion:    Leisure/Recreation:    Exercise/Diet:     CCA Employment/Education Employment/Work Situation:    Education:     CCA Family/Childhood History Family and Relationship History:    Childhood History:     Child/Adolescent Assessment:     CCA Substance Use Alcohol/Drug Use:                           ASAM's:  Six Dimensions of Multidimensional Assessment  Dimension 1:  Acute Intoxication and/or Withdrawal  Potential:      Dimension 2:  Biomedical Conditions and Complications:      Dimension 3:  Emotional, Behavioral, or Cognitive Conditions and Complications:     Dimension 4:  Readiness to Change:     Dimension 5:  Relapse, Continued use, or Continued Problem Potential:     Dimension 6:  Recovery/Living Environment:     ASAM Severity Score:    ASAM Recommended Level of Treatment:     Substance use Disorder (SUD)    Recommendations for Services/Supports/Treatments:    DSM5 Diagnoses: Patient Active Problem List   Diagnosis Date Noted   Acute psychosis (HCC) 04/16/2023   Hallucinations 03/25/2023   Schizophrenia (HCC) 04/28/2022   Suicidal ideation 12/14/2018   Cannabis use disorder, severe, dependence (HCC) 11/30/2014   Schizoaffective disorder, bipolar type (HCC) 11/30/2014   Tobacco use disorder 11/30/2014   Jammy Plotkin R Edwardine Deschepper, LCAS

## 2023-06-03 NOTE — ED Triage Notes (Signed)
Patient to ED from [facility] via police custody; IVC papers are completed. Patient denies SI/HI and will not engage in any other conversation. Per papers, patient admitted to SI/HI at [facility].

## 2023-06-03 NOTE — ED Notes (Signed)
Patient coming out of room quickly- will run to bathroom- slam door and then run back to room. Patient with very bizarre behavior and blunted affect. Patient begins yelling at individuals who are not there and punching garage door. Patient unable to be verbally deescalated and is disruptive to other patients on the unit. MD ordered IM injection for patient and staff safety. Anthony Luna is avoiding eye contact and responds minimally to staff. Security at bedside, Anthony Luna lays on his mattress willingly, does not require manual hold. He states "thank you" to staff and security after injection. Patient walking around room after injection, but is currently not yelling or acting violently post medication administration.

## 2023-06-03 NOTE — ED Triage Notes (Signed)
Pt belonging : grey sweatshirt, black pants , black sides , grey socks.

## 2023-06-03 NOTE — ED Notes (Signed)
Pt refused dinner tray, pt pressing call light, but does not need anything, slammed door, pacing around room at times.  When I left room after turning off the call light, pt sat down on the floor.

## 2023-06-03 NOTE — ED Provider Notes (Signed)
   [name] Provider Note    Event Date/Time   First MD Initiated Contact with Patient 06/03/23 1739     (approximate)   History   Chief Complaint: Psychiatric Evaluation   HPI  Anthony Luna is a 37 y.o. male with a history of schizophrenia who is sent to ED under IVC from [facility] for psych evaluation.  Patient is not engaging in conversation.  IVC petition notes the patient had described SI and HI.     Physical Exam   Triage Vital Signs: ED Triage Vitals [06/03/23 1701]  Encounter Vitals Group     BP      Systolic BP Percentile      Diastolic BP Percentile      Pulse      Resp      Temp      Temp src      SpO2      Weight      Height      Head Circumference      Peak Flow      Pain Score 0     Pain Loc      Pain Education      Exclude from Growth Chart     Most recent vital signs: There were no vitals filed for this visit.  General: Awake, no distress. CV:  Good peripheral perfusion.  Resp:  Normal effort.  Abd:  No distention.  Other:  No wounds.  Withdrawn affect   ED Results / Procedures / Treatments   Labs (all labs ordered are listed, but only abnormal results are displayed) Labs Reviewed  COMPREHENSIVE METABOLIC PANEL  ETHANOL  CBC  URINE DRUG SCREEN, QUALITATIVE ([facility] ONLY)     EKG    RADIOLOGY    PROCEDURES:  Procedures   MEDICATIONS ORDERED IN ED: Medications  ibuprofen (ADVIL) tablet 600 mg (has no administration in time range)  ondansetron (ZOFRAN) tablet 4 mg (has no administration in time range)  alum & mag hydroxide-simeth (MAALOX/MYLANTA) 200-200-20 MG/5ML suspension 30 mL (has no administration in time range)     IMPRESSION / MDM / ASSESSMENT AND PLAN / ED COURSE  I reviewed the triage vital signs and the nursing notes.  Patient's presentation is most consistent with acute presentation with potential threat to life or bodily function.  Patient presents with decompensated  schizophrenia, SI, HI.  Will consult psychiatry.  He is medically stable.  The patient has been placed in psychiatric observation due to the need to provide a safe environment for the patient while obtaining psychiatric consultation and evaluation, as well as ongoing medical and medication management to treat the patient's condition.  The patient has been placed under full IVC at this time.  Clinical Course as of 06/03/23 1910  Wed Jun 03, 2023  1909 Patient mains agitated which appears to be due to his decompensated schizophrenia.  Will give him Geodon to help treat his psychiatric illness while waiting for psychiatry evaluation [PS]    Clinical Course User Index [PS] Sharman Cheek, MD     FINAL CLINICAL IMPRESSION(S) / ED DIAGNOSES   Final diagnoses:  Schizophrenia, unspecified type (HCC)     Rx / DC Orders   ED Discharge Orders     None        Note:  This document was prepared using Dragon voice recognition software and may include unintentional dictation errors.   Sharman Cheek, MD 06/03/23 [phone]

## 2023-06-03 NOTE — Consult Note (Signed)
[facility] Psychiatric Consult Initial  Patient Name: .Anthony Luna  MRN: [id number]  DOB: 1986-03-17  Consult Order details:  Orders (From admission, onward)     Start     Ordered   06/03/23 1749  CONSULT TO CALL ACT TEAM       Ordering Provider: Sharman Cheek, MD  Provider:  (Not yet assigned)  Question:  Reason for Consult?  Answer:  Psych consult   06/03/23 1748   06/03/23 1749  IP CONSULT TO PSYCHIATRY       Ordering Provider: Sharman Cheek, MD  Provider:  (Not yet assigned)  Question Answer Comment  Consult Timeframe URGENT - requires response within 12 hours   URGENT timeframe requires provider to provider communication, has the provider to provider communication been completed Yes   Reason for Consult? Consult for medication management   Contact phone number where the requesting provider can be reached [phone]      06/03/23 1748   06/03/23 1703  CONSULT TO CALL ACT TEAM       Ordering Provider: Sharman Cheek, MD  Provider:  (Not yet assigned)  Question:  Reason for Consult?  Answer:  IVC   06/03/23 1703             Mode of Visit: Tele-visit Virtual Statement:TELE PSYCHIATRY ATTESTATION & CONSENT As the provider for this telehealth consult, I attest that I verified the patient's identity using two separate identifiers, introduced myself to the patient, provided my credentials, disclosed my location, and performed this encounter via a HIPAA-compliant, real-time, face-to-face, two-way, interactive audio and video platform and with the full consent and agreement of the patient (or guardian as applicable.) Patient physical location: [facility]. Telehealth provider physical location: home office in state of [place].   Video start time: 930 Video end time: 1000    Psychiatry Consult Evaluation  Service Date: June 03, 2023 LOS:  LOS: 0 days  Chief Complaint IVC'd by [facility] for bizarre behavior  Primary Psychiatric Diagnoses  Schizophrenia   Assessment   Anthony Luna is a 37 y.o. male admitted: Presented to the ED 06/03/2023  5:38 PM under IVC from [facility] for SI and HI. He carries the psychiatric diagnoses of schizophrenia.   His current presentation of SI/HI and bizarre behavior  is most consistent with non medication compliance.  He meets criteria for inpatient hospitalization based on psychiatric instability.   Per chart review, patient presented to the ed yesterday (06/02/2023).  Pt reports he lost his paperwork from earlier ED visit and that he does not have anywhere to stay. Pt ambulatory to triage room with steady gait. Writer attempting to triage by asking questions with pt refusal to answer. Pt asked if he wanted to be seen and shakes his head. Pt asked if he was leaving due to gathering his belongings and pt sts, Yes." Blood pressure cuff attached to pt still. Writer attempted to remove when pt became aggressive and boisterous toward Clinical research associate. Security called to assist. Pt out to lobby area where RN instructed that due to him not staying or wanting to be seen, he could not stay in lobby and would need to call for a ride. At time, pt went into lobby, sat beside other pts in a close manor to where pt was observed invading others personal space. Pt was asked to leave due to his behavior. [employer] called and in WR to assist due to refusal of pt and continued aggressive and inappropriate behavior.   Diagnoses:  Active  Hospital problems: Active Problems:   * No active hospital problems. *    Plan    ## Medical Decision Making Capacity: Not specifically addressed in this encounter  ## Further Work-up:  -- [name] was admitted [facility] ER for crisis management, and stabilization. Routine labs ordered, which include  Lab Orders         Comprehensive metabolic panel         Ethanol         cbc         Urine Drug Screen, Qualitative         Salicylate level         Acetaminophen level    Will maintain observation checks every 15  minutes for safety. Psychosocial education regarding relapse prevention and self-care; social and communication  Social work will consult with family for collateral information and discuss discharge and follow up plan.   ## Disposition:-- We recommend inpatient psychiatric hospitalization when medically cleared. Patient is under voluntary admission status at this time; please IVC if attempts to leave hospital.  ## Behavioral / Environmental: - No specific recommendations at this time.     Jearld Lesch, NP       History of Present Illness  Relevant Aspects of Hospital ED Course:   Patient Report: Patient is observed sleeping on the floor and bed had to be removed due to banging.  Per nurse report, patient coming out of room quickly- will run to bathroom- slam door and then run back to room. Patient with very bizarre behavior and blunted affect. Patient begins yelling at individuals who are not there and punching garage door. Patient unable to be verbally deescalated and is disruptive to other patients on the unit. MD ordered IM injection for patient and staff safety. Izek is avoiding eye contact and responds minimally to staff. Security at bedside, Bently lays on his mattress willingly, does not require manual hold. He states "thank you" to staff and security after injection. Patient walking around room after injection, but is currently not yelling or acting violently post medication administration.   Patient seen after administration of medication for bizarre and erratic behavior.  He reports that he came to the hospital because he is tired.  He also states that he came for the ham, [place] and sleep.  He denies feeling suicidal at this time.  When asked where he lives, he responds "I dont know'.  Then asked, "where do you stay".      Review of Systems  Psychiatric/Behavioral:  Positive for hallucinations. The patient is nervous/anxious.   All other systems reviewed and are negative.     Exam Findings  Physical Exam:  Vital Signs:  Temp:  [98.3 F (36.8 C)] 98.3 F (36.8 C) (12/18 1758) Pulse Rate:  [128] 128 (12/18 1758) Resp:  [20] 20 (12/18 1758) BP: (124)/(77) 124/77 (12/18 1758) SpO2:  [97 %] 97 % (12/18 1758) Blood pressure 124/77, pulse (!) 128, temperature 98.3 F (36.8 C), temperature source Axillary, resp. rate 20, SpO2 97%. There is no height or weight on file to calculate BMI.  Physical Exam Vitals and nursing note reviewed.  HENT:     Head: Normocephalic and atraumatic.     Nose: Nose normal.  Eyes:     Pupils: Pupils are equal, round, and reactive to light.  Pulmonary:     Effort: Pulmonary effort is normal.  Musculoskeletal:        General: Normal range of motion.  Cervical back: Normal range of motion.  Skin:    General: Skin is warm.  Neurological:     Mental Status: He is alert and oriented to person, place, and time.  Psychiatric:        Attention and Perception: He is inattentive.        Mood and Affect: Affect is inappropriate.        Speech: Speech is tangential.        Behavior: Behavior is withdrawn.        Cognition and Memory: Cognition is impaired. Memory is impaired.        Judgment: Judgment is impulsive and inappropriate.     Mental Status Exam: General Appearance: Casual  Orientation:  NA  Memory:  Immediate;   Poor Recent;   Poor  Concentration:  Concentration: Poor and Attention Span: Poor  Recall:  Poor  Attention  Poor  Eye Contact:  Minimal  Speech:  Blocked  Language:  Fair  Volume:  Decreased  Mood: NA  Affect:  Inappropriate and Restricted  Thought Process:  Disorganized and Irrelevant  Thought Content:  Illogical and Hallucinations: Auditory  Suicidal Thoughts:   unknown  Homicidal Thoughts:   unknown  Judgement:  Impaired  Insight:  Lacking and Shallow  Psychomotor Activity:  Normal  Akathisia:  No  Fund of Knowledge:  NA      Assets:  Resilience  Cognition:  Impaired,  Moderate  ADL's:   Intact  AIMS (if indicated):        Other History   These have been pulled in through the EMR, reviewed, and updated if appropriate.  Family History:  The patient's family history is not on file.  Medical History: Past Medical History:  Diagnosis Date   Depression    Since moving into new apartment 1 year ago   None to low serum cortisol response with adrenocorticotrophic hormone (ACTH) stimulation test    Schizophrenia, paranoid type [facility])     Surgical History: History reviewed. No pertinent surgical history.   Medications:   Current Facility-Administered Medications:    alum & mag hydroxide-simeth (MAALOX/MYLANTA) 200-200-20 MG/5ML suspension 30 mL, 30 mL, Oral, Q6H PRN, Sharman Cheek, MD   ibuprofen (ADVIL) tablet 600 mg, 600 mg, Oral, Q8H PRN, Sharman Cheek, MD   ondansetron [facility]) tablet 4 mg, 4 mg, Oral, Q8H PRN, Sharman Cheek, MD  Current Outpatient Medications:    albuterol (VENTOLIN HFA) 108 (90 Base) MCG/ACT inhaler, Inhale 1-2 puffs into the lungs every 6 (six) hours as needed for shortness of breath., Disp: 18 g, Rfl: 1   benztropine (COGENTIN) 0.5 MG tablet, Take 2 tablets (1 mg total) by mouth 2 (two) times daily., Disp: 60 tablet, Rfl: 1   buPROPion (WELLBUTRIN XL) 150 MG 24 hr tablet, Take 1 tablet (150 mg total) by mouth daily., Disp: 30 tablet, Rfl: 1   cetirizine (ZYRTEC) 10 MG tablet, Take 1 tablet (10 mg total) by mouth daily., Disp: 30 tablet, Rfl: 1   clonazePAM (KLONOPIN) 0.5 MG tablet, Take 1 tablet (0.5 mg total) by mouth 2 (two) times daily., Disp: 60 tablet, Rfl: 1   cloZAPine (CLOZARIL) 100 MG tablet, Take 300 mg by mouth at bedtime. Take along with one 50 mg tablet for total 350 mg at bedtime, Disp: , Rfl:    cloZAPine (CLOZARIL) 50 MG tablet, Take 7 tablets (350 mg total) by mouth at bedtime. (Patient not taking: Reported on 04/27/2023), Disp: 210 tablet, Rfl: 0   docusate sodium (COLACE) 100  MG capsule, Take 1 capsule (100 mg total)  by mouth 2 (two) times daily., Disp: 60 capsule, Rfl: 1   fluticasone (FLONASE) 50 MCG/ACT nasal spray, Place 2 sprays into both nostrils daily as needed for allergies or rhinitis., Disp: 16 g, Rfl: 1   hydrOXYzine (ATARAX) 25 MG tablet, Take 1 tablet (25 mg total) by mouth 3 (three) times daily as needed for anxiety., Disp: 30 tablet, Rfl: 1   melatonin 5 MG TABS, Take 1 tablet (5 mg total) by mouth at bedtime., Disp: 30 tablet, Rfl: 1   montelukast (SINGULAIR) 10 MG tablet, Take 1 tablet (10 mg total) by mouth at bedtime., Disp: 30 tablet, Rfl: 1   nicotine (NICODERM CQ - DOSED IN MG/24 HOURS) 14 mg/24hr patch, Place 1 patch (14 mg total) onto the skin daily for 28 days., Disp: 28 patch, Rfl: 0   nicotine (NICODERM CQ - DOSED IN MG/24 HOURS) 21 mg/24hr patch, Place 1 patch (21 mg total) onto the skin daily., Disp: 28 patch, Rfl: 1   OLANZapine (ZYPREXA) 10 MG tablet, Take 1 tablet (10 mg total) by mouth every 6 (six) hours as needed (Psychosis)., Disp: 30 tablet, Rfl: 0   Paliperidone Palmitate (INVEGA TRINZA) 819 MG/2.625ML SUSP, Inject 819 mg into the muscle every 30 (thirty) days., Disp: 2.625 mL, Rfl: 0   propranolol (INDERAL) 20 MG tablet, Take 1 tablet (20 mg total) by mouth 2 (two) times daily., Disp: 60 tablet, Rfl: 0   risperiDONE (RISPERDAL) 1 MG tablet, Take 1 tablet (1 mg total) by mouth 2 (two) times daily at 8 am and 4 pm., Disp: 60 tablet, Rfl: 0   traZODone (DESYREL) 50 MG tablet, Take 1 tablet (50 mg total) by mouth at bedtime as needed for sleep., Disp: 30 tablet, Rfl: 0   Vitamin D, Ergocalciferol, (DRISDOL) 1.25 MG (50000 UNIT) CAPS capsule, Take 1 capsule (50,000 Units total) by mouth every 7 (seven) days for 5 doses., Disp: 5 capsule, Rfl: 0  Allergies: Allergies  Allergen Reactions   Shellfish Allergy     Yanky Vanderburg Damaris Hippo, NP

## 2023-06-04 MED ORDER — OLANZAPINE 10 MG PO TABS
10.0000 mg | ORAL_TABLET | ORAL | Status: AC
  Administered 2023-06-04: 10 mg via ORAL
  Filled 2023-06-04 (×2): qty 1

## 2023-06-04 MED ORDER — LORAZEPAM 2 MG PO TABS
2.0000 mg | ORAL_TABLET | Freq: Once | ORAL | Status: AC
  Administered 2023-06-04: 2 mg via ORAL
  Filled 2023-06-04: qty 1

## 2023-06-04 NOTE — ED Notes (Signed)
Erratic behavior continues. Pt less directable.

## 2023-06-04 NOTE — ED Notes (Signed)
Pt is standing in their room with the lights off and jumping up and down. When asked if the pt needed anything, pt denies needing anything at this time. The RN asked the pt to be careful so that they don't injure themselves and pt nodded in response.

## 2023-06-04 NOTE — ED Notes (Signed)
Pt given breakfast tray

## 2023-06-04 NOTE — ED Notes (Addendum)
Banging heard from outside pts room. Pt visualized in room with erratic behaviors and hitting walls. Pt initially unwilling to make eye contact with this RN but does so after multiple redirections. Reminded pt of quiet hours and the importance of not disrupting others. Pt speaking with head down and states "okay". Pt denies any wants/needs at this time.

## 2023-06-04 NOTE — ED Notes (Signed)
A representative from [facility], [employer]into, called to see if one of their associates could assess this pt today. They were instructed that it would be fine if they assessed this pt today and was made aware of our protocol. They were instructed to park in the ED parking lot and told that they will need to check in with the first nurse when they arrive.

## 2023-06-04 NOTE — ED Notes (Signed)
Pt given hospital provided lunch tray.

## 2023-06-04 NOTE — ED Provider Notes (Signed)
Emergency Medicine Observation Re-evaluation Note  Physical Exam   BP 124/77 (BP Location: Left Arm)   Pulse (!) 128   Temp 98.3 F (36.8 C) (Axillary)   Resp 20   SpO2 97%   Patient appears in no acute distress.  ED Course / MDM   No reported events during my shift at the time of this note.   Pt is awaiting dispo from consultants   Pilar Jarvis MD    Pilar Jarvis, MD 06/04/23 [phone]

## 2023-06-04 NOTE — ED Notes (Signed)
IVC/RECOMMENDED FOR INPATIENT WHEN MEDICALLY CLEARED.

## 2023-06-04 NOTE — ED Notes (Signed)
Pt given shower supplies and new wine colored scrubs. Pt finished showering at this time and back in room. Pt then given clean sheet to put on bed and clean blanket. Pt calm and cooperative with no other needs at this time.

## 2023-06-04 NOTE — ED Notes (Signed)
During nursing assessment Anthony Luna was responding to external stimuli. Anthony Luna is currently having auditory and visual hallucinations.  Pt affect is flat, eye contact is minimal, speech is clear but delayed with incoherent verbiage noted to questions asked by staff. Staff addressed any feelings or concerns that have been brought up. No medications are due at this time. Continue to monitor patient as ordered for any changes in behaviors and for continued safety.

## 2023-06-04 NOTE — BH Assessment (Signed)
Per [facility]), patient to be referred out of system.  Referral information for Psychiatric Hospitalization faxed to;   [facility] [phone]) No appropriate beds available.  [facility] [phone]),  [facility] [phone]--- [phone]--- [phone])  [address] [phone]),   [facility] [phone] -or- [phone]),   [facility] [phone]),  [place] [phone])  [facility] [phone])

## 2023-06-04 NOTE — ED Notes (Signed)
IVC inpatient placement

## 2023-06-05 MED ORDER — LORAZEPAM 2 MG PO TABS
2.0000 mg | ORAL_TABLET | Freq: Once | ORAL | Status: AC
  Administered 2023-06-05: 2 mg via ORAL
  Filled 2023-06-05: qty 1

## 2023-06-05 NOTE — ED Notes (Signed)
Pt requesting channel to be changed. RN went out of room to retrieve remote, pt slammed door behind nurse. Will keep door shut and minimize interaction with patient at this time to encourage de-escalation,.

## 2023-06-05 NOTE — BH Assessment (Addendum)
PATIENT BED AVAILABLE ANYTIME TODAY 06/05/23  Patient has been accepted to [facility]  Accepting physician is Dr. Norm Parcel.  Call report to [phone].  Representative was [employer].   ER Staff is aware of it:  [facility] ER Secretary  Dr. Modesto Charon, ER MD  Connye Burkitt Patient's Nurse  Facility requesting to call report when transportation has arrived

## 2023-06-05 NOTE — ED Notes (Signed)
Transferred to [facility] with [facility]. 1 bag of belongings and transfer paperwork sent with [facility]

## 2023-06-05 NOTE — ED Notes (Signed)
Pacing around room, states cannot sleep. EDP S. Wong notified.

## 2023-06-05 NOTE — ED Notes (Signed)
EMTALA reviewed by this RN, pt under IVC no consent needed at this time., Pt ready for transfer.

## 2023-06-05 NOTE — ED Provider Notes (Addendum)
Patient is currently resting comfortably on bed.  He is in no distress.  Discussed with him plan to transfer to [facility].  He is agreeable and understanding.  He is currently calm compliant with care request.  Appears appropriate for transfer to inpatient psychiatric care  Vital signs have been reassessed, heart rate has improved to 101.  He is in no distress and appears appropriate for transfer   Sharyn Creamer, MD 06/05/23 8756    Sharyn Creamer, MD 06/05/23 [phone]

## 2023-06-05 NOTE — ED Provider Notes (Signed)
Emergency Medicine Observation Re-evaluation Note  Physical Exam   BP (!) 144/99   Pulse (!) 124   Temp 98.3 F (36.8 C) (Oral)   Resp 17   SpO2 100%   Patient appears in no acute distress.  ED Course / MDM   Behaving erratically, unable to sleep, amenable for p.o. medications, given Ativan.  Pt is awaiting dispo from consultants   Pilar Jarvis MD    Pilar Jarvis, MD 06/05/23 [phone]

## 2023-06-05 NOTE — ED Notes (Signed)
[place]  [employer]  called  for  transport  to  [facility]

## 2023-06-05 NOTE — ED Notes (Signed)
Pt went to bathroom and turned on shower. Pt standing under water with full clothing on. Instructed to get out of shower, as it is not time for shower. Pt stated he was thirsty, instructed to ask for water next time. Back to room at this time. Given water and fresh clothing. Pt dressed into dry scrubs.

## 2023-06-05 NOTE — BH Assessment (Signed)
Referral information for Psychiatric Hospitalization faxed to;    [facility] [phone]- [phone]) No appropriate beds available.   [facility] [phone]),   [facility] [phone]--- [phone])   [address] [phone]),    [facility] [phone] -or- [phone]),    [facility] [phone])   [facility] [phone])

## 2023-06-10 ENCOUNTER — Other Ambulatory Visit: Payer: Self-pay

## 2023-06-10 ENCOUNTER — Emergency Department
Admission: EM | Admit: 2023-06-10 | Discharge: 2023-06-10 | Disposition: A | Discharge status: Home / Self Care | Payer: MEDICAID | Source: Home / Self Care | Attending: Emergency Medicine | Admitting: Emergency Medicine

## 2023-06-10 ENCOUNTER — Emergency Department
Admission: EM | Admit: 2023-06-10 | Discharge: 2023-06-10 | Disposition: A | Discharge status: Home / Self Care | Payer: MEDICAID | Attending: Emergency Medicine | Admitting: Emergency Medicine

## 2023-06-10 DIAGNOSIS — F250 Schizoaffective disorder, bipolar type: Secondary | ICD-10-CM

## 2023-06-10 DIAGNOSIS — Z139 Encounter for screening, unspecified: Secondary | ICD-10-CM

## 2023-06-10 DIAGNOSIS — Z0001 Encounter for general adult medical examination with abnormal findings: Secondary | ICD-10-CM | POA: Diagnosis not present

## 2023-06-10 DIAGNOSIS — Z765 Malingerer [conscious simulation]: Secondary | ICD-10-CM | POA: Insufficient documentation

## 2023-06-10 DIAGNOSIS — F209 Schizophrenia, unspecified: Secondary | ICD-10-CM | POA: Insufficient documentation

## 2023-06-10 DIAGNOSIS — Z5900 Homelessness unspecified: Secondary | ICD-10-CM | POA: Insufficient documentation

## 2023-06-10 DIAGNOSIS — R4589 Other symptoms and signs involving emotional state: Secondary | ICD-10-CM | POA: Diagnosis not present

## 2023-06-10 LAB — CBC
HCT: 43.9 % (ref 39.0–52.0)
Hemoglobin: 14.5 g/dL (ref 13.0–17.0)
MCH: 30.9 pg (ref 26.0–34.0)
MCHC: 33 g/dL (ref 30.0–36.0)
MCV: 93.6 fL (ref 80.0–100.0)
Platelets: 231 10*3/uL (ref 150–400)
RBC: 4.69 MIL/uL (ref 4.22–5.81)
RDW: 14 % (ref 11.5–15.5)
WBC: 8.9 10*3/uL (ref 4.0–10.5)
nRBC: 0 % (ref 0.0–0.2)

## 2023-06-10 LAB — COMPREHENSIVE METABOLIC PANEL
ALT: 85 U/L — ABNORMAL HIGH (ref 0–44)
AST: 82 U/L — ABNORMAL HIGH (ref 15–41)
Albumin: 4.2 g/dL (ref 3.5–5.0)
Alkaline Phosphatase: 59 U/L (ref 38–126)
Anion gap: 12 (ref 5–15)
BUN: 16 mg/dL (ref 6–20)
CO2: 25 mmol/L (ref 22–32)
Calcium: 9.2 mg/dL (ref 8.9–10.3)
Chloride: 100 mmol/L (ref 98–111)
Creatinine, Ser: 1.14 mg/dL (ref 0.61–1.24)
GFR, Estimated: >60 mL/min (ref >60)
Glucose, Bld: 101 mg/dL — ABNORMAL HIGH (ref 70–99)
Potassium: 4 mmol/L (ref 3.5–5.1)
Sodium: 137 mmol/L (ref 135–145)
Total Bilirubin: 1 mg/dL (ref <1.2)
Total Protein: 7.4 g/dL (ref 6.5–8.1)

## 2023-06-10 LAB — URINE DRUG SCREEN, QUALITATIVE (ARMC ONLY)
Amphetamines, Ur Screen: NOT DETECTED
Barbiturates, Ur Screen: NOT DETECTED
Benzodiazepine, Ur Scrn: NOT DETECTED
Cannabinoid 50 Ng, Ur ~~LOC~~: POSITIVE — AB
Cocaine Metabolite,Ur ~~LOC~~: NOT DETECTED
MDMA (Ecstasy)Ur Screen: NOT DETECTED
Methadone Scn, Ur: NOT DETECTED
Opiate, Ur Screen: NOT DETECTED
Phencyclidine (PCP) Ur S: NOT DETECTED
Tricyclic, Ur Screen: NOT DETECTED

## 2023-06-10 LAB — ETHANOL: Alcohol, Ethyl (B): <10 mg/dL (ref <10)

## 2023-06-10 LAB — ACETAMINOPHEN LEVEL: Acetaminophen (Tylenol), Serum: <10 ug/mL — ABNORMAL LOW (ref 10–30)

## 2023-06-10 LAB — SALICYLATE LEVEL: Salicylate Lvl: <7 mg/dL — ABNORMAL LOW (ref 7.0–30.0)

## 2023-06-10 MED ORDER — OLANZAPINE 10 MG PO TABS: 10.0000 mg | ORAL_TABLET | Freq: Every day | ORAL | Status: DC

## 2023-06-10 MED ORDER — CLONAZEPAM 0.5 MG PO TABS
0.5000 mg | ORAL_TABLET | Freq: Two times a day (BID) | ORAL | Status: DC
  Administered 2023-06-10: 0.5 mg via ORAL
  Filled 2023-06-10: qty 1

## 2023-06-10 MED ORDER — ALBUTEROL SULFATE HFA 108 (90 BASE) MCG/ACT IN AERS: 1.0000 | INHALATION_SPRAY | Freq: Four times a day (QID) | RESPIRATORY_TRACT | Status: DC | PRN

## 2023-06-10 MED ORDER — HYDROXYZINE HCL 25 MG PO TABS: 25.0000 mg | ORAL_TABLET | Freq: Three times a day (TID) | ORAL | Status: DC | PRN

## 2023-06-10 MED ORDER — BUPROPION HCL ER (XL) 150 MG PO TB24
150.0000 mg | ORAL_TABLET | Freq: Every day | ORAL | Status: DC
  Administered 2023-06-10: 150 mg via ORAL
  Filled 2023-06-10: qty 1

## 2023-06-10 MED ORDER — OLANZAPINE 10 MG PO TABS: 10.0000 mg | ORAL_TABLET | Freq: Four times a day (QID) | ORAL | Status: DC | PRN

## 2023-06-10 MED ORDER — FLUTICASONE PROPIONATE 50 MCG/ACT NA SUSP: 2.0000 | Freq: Every day | NASAL | Status: DC | PRN

## 2023-06-10 MED ORDER — BENZTROPINE MESYLATE 1 MG PO TABS
1.0000 mg | ORAL_TABLET | Freq: Two times a day (BID) | ORAL | Status: DC
  Administered 2023-06-10: 1 mg via ORAL
  Filled 2023-06-10: qty 1

## 2023-06-10 MED ORDER — MONTELUKAST SODIUM 10 MG PO TABS: 10.0000 mg | ORAL_TABLET | Freq: Every day | ORAL | Status: DC

## 2023-06-10 MED ORDER — LORATADINE 10 MG PO TABS
10.0000 mg | ORAL_TABLET | Freq: Every day | ORAL | Status: DC
  Administered 2023-06-10: 10 mg via ORAL
  Filled 2023-06-10: qty 1

## 2023-06-10 MED ORDER — DOCUSATE SODIUM 100 MG PO CAPS
100.0000 mg | ORAL_CAPSULE | Freq: Two times a day (BID) | ORAL | Status: DC
  Administered 2023-06-10: 100 mg via ORAL
  Filled 2023-06-10: qty 1

## 2023-06-10 MED ORDER — NICOTINE 14 MG/24HR TD PT24
14.0000 mg | MEDICATED_PATCH | Freq: Every day | TRANSDERMAL | Status: DC
  Administered 2023-06-10: 14 mg via TRANSDERMAL
  Filled 2023-06-10: qty 1

## 2023-06-10 MED ORDER — MELATONIN 5 MG PO TABS: 5.0000 mg | ORAL_TABLET | Freq: Every day | ORAL | Status: DC

## 2023-06-10 NOTE — ED Notes (Signed)
Patient given dinner tray.

## 2023-06-10 NOTE — ED Notes (Signed)
Vol / pending discharge 

## 2023-06-10 NOTE — ED Triage Notes (Signed)
Pt was here this morning, when asked why he is at the ER today pt states "nothing." Pt c/o left arm pain, scar tissue noted. Laceration from this AM noted on right wrist. Pt denies SI/HI.

## 2023-06-10 NOTE — ED Notes (Signed)
Provider informed this RN that pt needs to be escorted out by security - that he does not need to be here. This RN gave pt discharge paperwork and told pt he was cleared to go. Pt told this RN to "back away" from him. Security at bedside and escorted pt out.

## 2023-06-10 NOTE — Discharge Instructions (Signed)
Please follow up with your ACT team, and look at the included information about resources like local shelters.

## 2023-06-10 NOTE — ED Provider Notes (Signed)
[facility] Provider Note    Event Date/Time   First MD Initiated Contact with Patient 06/10/23 2259     (approximate)   History   Will not answer   HPI Anthony Luna is a 37 y.o. male well-known to the emergency department with multiple recent visits for psychiatric care.  He was seen earlier today and has been seen multiple times over the last couple of weeks, including a recent transfer to an inpatient psychiatric facility.  He has schizophrenia and frequently presents with a flat affect, often will not speak with providers or staff, but then will be labile and act out, aggressively at times.  Tonight he presents and says "I want help".  When he chooses to speak, he states that he is homeless and has nowhere to stay.  He claims he is not able to get into the homeless shelter so he came to the emergency department.  He is, however, acting angry and mostly refusing to answer.  He was here earlier today and was evaluated by psychiatry who investigated and obtained collateral including the fact that he has an ACT team.  The patient will not give me much information except "I need help because I am homeless" and what he stated about not being able to get into a homeless shelter "because I don't have ID".  He refuses to give any additional information.  He does not state that he has any medical complaints or concerns.  He is not expressing any suicidal ideation nor homicidal ideation.     Physical Exam   Triage Vital Signs: ED Triage Vitals  Encounter Vitals Group     BP 06/10/23 2152 129/79     Systolic BP Percentile --      Diastolic BP Percentile --      Pulse Rate 06/10/23 2150 100     Resp 06/10/23 2150 18     Temp 06/10/23 2150 97.6 F (36.4 C)     Temp Source 06/10/23 2150 Oral     SpO2 06/10/23 2150 99 %     Weight --      Height --      Head Circumference --      Peak Flow --      Pain Score 06/10/23 2150 5     Pain Loc --      Pain  Education --      Exclude from Growth Chart --     Most recent vital signs: Vitals:   06/10/23 2150 06/10/23 2152  BP:  129/79  Pulse: 100   Resp: 18   Temp: 97.6 F (36.4 C)   SpO2: 99%     General: Awake, no obvious distress. CV:  Good peripheral perfusion.  Borderline tachycardia, regular rhythm. Resp:  Normal effort. Speaking easily and comfortably, no accessory muscle usage nor intercostal retractions.   Abd:  No distention.  Other:  Patient is staring straight ahead and typically will not respond.  When he does answer he does so angrily but will make eye contact.  He states that he is homeless and that is why he needs help.  His emotions are somewhat labile; he will go from staring off and not responding to angrily answering questions, arguing with me when I asked questions or make statements about his recent visits, etc.   ED Results / Procedures / Treatments   Labs (all labs ordered are listed, but only abnormal results are displayed) Labs Reviewed - No data to  display   PROCEDURES:  Critical Care performed: No  Procedures    IMPRESSION / MDM / ASSESSMENT AND PLAN / ED COURSE  I reviewed the triage vital signs and the nursing notes.                              Differential diagnosis includes, but is not limited to, schizophrenia, mood disorder, adjustment disorder, malingering.  Patient's presentation is most consistent with exacerbation of chronic illness.    Patient was seen earlier today and I read the psychiatry consult note written by Nanine Means, psych NP, earlier this afternoon.  He certainly seems to be struggling with his schizophrenia, but he has an ACT team and outpatient resources.  He does not seem to understand that his homelessness does not justify staying in the emergency department or the hospital and he has been deemed appropriate for outpatient follow-up.  He told multiple staff members both this afternoon and this evening that he is here  because he is homeless, and at no point has he expressed any suicidal ideation or homicidal ideation.  I explained to him that we would not be able to further accommodate him in the emergency department and that we had nothing else to offer in the emergent setting.  He initially seemed to be refusing to leave so we began the process of contacting law enforcement while hospital security stood with him.  However he decided to leave on his own accord prior to law enforcement arriving.  Based on my professional experience and review of his medical record, I see no evidence that he has an emergent medical or psychiatric condition that requires additional evaluation or treatment and I am concerned that he is malingering.  He has outpatient resources with whom he can follow-up.      FINAL CLINICAL IMPRESSION(S) / ED DIAGNOSES   Final diagnoses:  Schizophrenia, unspecified type [facility])  Homelessness  Malingering     Rx / DC Orders   ED Discharge Orders     None        Note:  This document was prepared using Dragon voice recognition software and may include unintentional dictation errors.   Loleta Rose, MD 06/10/23 2328

## 2023-06-10 NOTE — ED Notes (Signed)
Pt not forth coming with information. Answering some questions appropriately. Delayed responses. Will not communicate his needs to this RN. Pt stating he does not have any pain and denies SI/HI. Pt states he is homeless and "wants resources".

## 2023-06-10 NOTE — ED Provider Notes (Signed)
The patient has been placed in psychiatric observation due to the need to provide a safe environment for the patient while obtaining psychiatric consultation and evaluation, as well as ongoing medical and medication management to treat the patient's condition.      Sharyn Creamer, MD 06/10/23 1020

## 2023-06-10 NOTE — ED Notes (Signed)
Pt was walking to the bathroom with his penis outside of his pants and in his hand. Pt informed of inappropriateness and it will not be tolerated.

## 2023-06-10 NOTE — BH Assessment (Signed)
Comprehensive Clinical Assessment (CCA) Note  06/10/2023 Anthony Luna [id number]  Chief Complaint:  Chief Complaint  Patient presents with   Suicidal   Anthony Luna arrived to the ED by way of law enforcement. He identified that he called the police to get help. He shared that his heart was not feeling right.  He reports feeling lonely for about a year or two.  He shared that his placement is uncomfortable.  He shared that he stays with his father and he has problems with his family.  He denied symptoms of depression. He reports that his anxiety is high.  He shared that when people come around him when he is doing "something" his anxiety increases.  He denied having auditory or visual hallucinations.  He denied suicidal ideation or intent. He stated that he wanted to hurt other people, but declined to identify who. He did not identify a plan to harm others. He reports that he drinks two beers a day, but has not had any today. Although he denied having hallucinations, he appears to be responding to internal stimuli and was easily agitated. Anthony Luna states that he wants help, but does not know what kind of help or what he wants help with.  Visit Diagnosis: Schizophrenia   CCA Screening, Triage and Referral (STR)  Patient Reported Information How did you hear about [place]? Self  Referral name: No data recorded Referral phone number: No data recorded  Whom do you see for routine medical problems? No data recorded Practice/Facility Name: No data recorded Practice/Facility Phone Number: No data recorded Name of Contact: No data recorded Contact Number: No data recorded Contact Fax Number: No data recorded Prescriber Name: No data recorded Prescriber Address (if known): No data recorded  What Is the Reason for Your Visit/Call Today? Suicidal  How Long Has This Been Causing You Problems? <Week  What Do You Feel Would Help You the Most Today? Treatment for Depression or other mood  problem   Have You Recently Been in Any Inpatient Treatment (Hospital/Detox/Crisis Center/28-Day Program)? No data recorded Name/Location of Program/Hospital:No data recorded How Long Were You There? No data recorded When Were You Discharged? No data recorded  Have You Ever Received Services From [facility] Before? No data recorded Who Do You See at [facility]? No data recorded  Have You Recently Had Any Thoughts About Hurting Yourself? No  Are You Planning to Commit Suicide/Harm Yourself At This time? No   Have you Recently Had Thoughts About Hurting Someone Anthony Luna? Yes  Explanation: Pt denies current SI/HI.   Have You Used Any Alcohol or Drugs in the Past 24 Hours? Yes  How Long Ago Did You Use Drugs or Alcohol? No data recorded What Did You Use and How Much? 2 beers   Do You Currently Have a Therapist/Psychiatrist? Yes  Name of Therapist/Psychiatrist: [facility]   Have You Been Recently Discharged From Any Office Practice or Programs? Yes  Explanation of Discharge From Practice/Program: n/a     CCA Screening Triage Referral Assessment Type of Contact: Face-to-Face  Is this Initial or Reassessment? No data recorded Date Telepsych consult ordered in [facility]:  No data recorded Time Telepsych consult ordered in [facility]:  No data recorded  Patient Reported Information Reviewed? No data recorded Patient Left Without Being Seen? No data recorded Reason for Not Completing Assessment: No data recorded  Collateral Involvement: None provided   Does Patient Have a Court Appointed Legal Guardian? No data recorded Name and Contact  of Legal Guardian: No data recorded If Minor and Not Living with Parent(s), Who has Custody? n/a  Is CPS involved or ever been involved? Never  Is APS involved or ever been involved? Never   Patient Determined To Be At Risk for Harm To Self or Others Based on Review of Patient Reported Information or Presenting Complaint? No  Method:  No Plan  Availability of Means: No access or NA  Intent: Vague intent or NA  Notification Required: No need or identified person  Additional Information for Danger to Others Potential: Active psychosis  Additional Comments for Danger to Others Potential: n/a  Are There Guns or Other Weapons in Your Home? No  Types of Guns/Weapons: n/a  Are These Weapons Safely Secured?                            No  Who Could Verify You Are Able To Have These Secured: n/a  Do You Have any Outstanding Charges, Pending Court Dates, Parole/Probation? None  Contacted To Inform of Risk of Harm To Self or Others: -- (n/a)   Location of Assessment: [facility] ED   Does Patient Present under Involuntary Commitment? No  IVC Papers Initial File Date: No data recorded  [place] of Residence: [place]   Patient Currently Receiving the Following Services: ACTT Psychologist, educational); Medication Management   Determination of Need: Emergent (2 hours)   Options For Referral: ED Visit     CCA Biopsychosocial Intake/Chief Complaint:  No data recorded Current Symptoms/Problems: No data recorded  Patient Reported Schizophrenia/Schizoaffective Diagnosis in Past: Yes   Strengths: Has a support system and some insight.  Preferences: No data recorded Abilities: No data recorded  Type of Services Patient Feels are Needed: No data recorded  Initial Clinical Notes/Concerns: No data recorded  Mental Health Symptoms Depression:  Change in energy/activity; Irritability   Duration of Depressive symptoms: Greater than two weeks   Mania:  Increased Energy; Overconfidence; Recklessness; Racing thoughts; Irritability   Anxiety:   Irritability; Restlessness; Tension   Psychosis:  Hallucinations   Duration of Psychotic symptoms: Greater than six months   Trauma:  N/A   Obsessions:  N/A   Compulsions:  N/A   Inattention:  N/A   Hyperactivity/Impulsivity:  N/A   Oppositional/Defiant  Behaviors:  None   Emotional Irregularity:  Potentially harmful impulsivity; Recurrent suicidal behaviors/gestures/threats   Other Mood/Personality Symptoms:  n/a    Mental Status Exam Appearance and self-care  Stature:  Tall   Weight:  Average weight   Clothing:  Neat/clean; Age-appropriate   Grooming:  Neglected   Cosmetic use:  None   Posture/gait:  Normal   Motor activity:  Restless   Sensorium  Attention:  Distractible   Concentration:  Variable   Orientation:  Place; Person; Situation; Time   Recall/memory:  Normal   Affect and Mood  Affect:  Full Range   Mood:  Dysphoric; Irritable   Relating  Eye contact:  Normal   Facial expression:  Constricted   Attitude toward examiner:  Resistant   Thought and Language  Speech flow: Paucity   Thought content:  Appropriate to Mood and Circumstances   Preoccupation:  None   Hallucinations:  Auditory; Visual   Organization:  No data recorded  [employer] of Knowledge:  Fair   Intelligence:  Average   Abstraction:  Functional   Judgement:  Impaired   Reality Testing:  Distorted   Insight:  Poor  Decision Making:  Impulsive   Social Functioning  Social Maturity:  Impulsive; Irresponsible   Social Judgement:  "Chief of Staff"; Heedless   Stress  Stressors:  Illness   Coping Ability:  Exhausted; Overwhelmed   Skill Deficits:  Decision making   Supports:  Family; Friends/Service system     Religion: Religion/Spirituality Are You A Religious Person?: No How Might This Affect Treatment?: UTA  Leisure/Recreation: Leisure / Recreation Do You Have Hobbies?: Yes Leisure and Hobbies: "I love to listen to all types of music."  Exercise/Diet: Exercise/Diet Do You Exercise?: No Have You Gained or Lost A Significant Amount of Weight in the Past Six Months?: No Do You Follow a Special Diet?: No Do You Have Any Trouble Sleeping?: No   CCA Employment/Education Employment/Work  Situation: Employment / Work Situation Employment Situation: On disability Why is Patient on Disability: "Since my mother passed." How Long has Patient Been on Disability: Patient was unable to provide information. Patient's Job has Been Impacted by Current Illness: No Has Patient ever Been in the [employer]?: No  Education: Education Did Theme park manager?: No Did You Have An Individualized Education Program (IIEP): No Did You Have Any Difficulty At School?: No   CCA Family/Childhood History Family and Relationship History: Family history Separated, when?: Patient reports that the seperation happened 5 or 6 months ago. What types of issues is patient dealing with in the relationship?: "Yes." Does patient have children?: No  Childhood History:  Childhood History By whom was/is the patient raised?: Both parents, Grandparents Description of patient's current relationship with siblings: "It's ok." Did patient suffer any verbal/emotional/physical/sexual abuse as a child?: No Has patient ever been sexually abused/assaulted/raped as an adolescent or adult?: No Type of abuse, by whom, and at what age: Patient denies. Spoken with a professional about abuse?:  (uta) Does patient feel these issues are resolved?:  (uta) Witnessed domestic violence?: No Has patient been affected by domestic violence as an adult?: No Description of domestic violence: Patient denies.  Child/Adolescent Assessment:     CCA Substance Use Alcohol/Drug Use: Alcohol / Drug Use Pain Medications: See PTA Prescriptions: See PTA Over the Counter: See PTA History of alcohol / drug use?: Yes Longest period of sobriety (when/how long): unknown Negative Consequences of Use: Financial, Personal relationships, Work / School Withdrawal Symptoms: None                         ASAM's:  Six Dimensions of Multidimensional Assessment  Dimension 1:  Acute Intoxication and/or Withdrawal Potential:    Dimension 1:  Description of individual's past and current experiences of substance use and withdrawal: Pt has a hx of cannabis use  Dimension 2:  Biomedical Conditions and Complications:      Dimension 3:  Emotional, Behavioral, or Cognitive Conditions and Complications:     Dimension 4:  Readiness to Change:     Dimension 5:  Relapse, Continued use, or Continued Problem Potential:     Dimension 6:  Recovery/Living Environment:     ASAM Severity Score: ASAM's Severity Rating Score: 12  ASAM Recommended Level of Treatment: ASAM Recommended Level of Treatment: Level II Intensive Outpatient Treatment   Substance use Disorder (SUD) Substance Use Disorder (SUD)  Checklist Symptoms of Substance Use: Continued use despite having a persistent/recurrent physical/psychological problem caused/exacerbated by use, Continued use despite persistent or recurrent social, interpersonal problems, caused or exacerbated by use, Presence of craving or strong urge to use  Recommendations for Services/Supports/Treatments: Recommendations  for Services/Supports/Treatments Recommendations For Services/Supports/Treatments: Individual Therapy  DSM5 Diagnoses: Patient Active Problem List   Diagnosis Date Noted   Acute psychosis (HCC) 04/16/2023   Hallucinations 03/25/2023   Schizophrenia (HCC) 04/28/2022   Suicidal ideation 12/14/2018   Cannabis use disorder, severe, dependence (HCC) 11/30/2014   Schizoaffective disorder, bipolar type (HCC) 11/30/2014   Tobacco use disorder 11/30/2014       [email or web address] @  [place], Counselor

## 2023-06-10 NOTE — Consult Note (Signed)
[facility] Psychiatric Consult Initial  Patient Name: .Anthony Luna  MRN: [id number]  DOB: [birth year]  Consult Order details:  Orders (From admission, onward)     Start     Ordered   06/10/23 1009  CONSULT TO CALL ACT TEAM       Ordering Provider: Sharyn Creamer, MD  Provider:  (Not yet assigned)  Question:  Reason for Consult?  Answer:  mental health evaluation requested by patient   06/10/23 1008   06/10/23 1009  IP CONSULT TO PSYCHIATRY       Ordering Provider: Sharyn Creamer, MD  Provider:  (Not yet assigned)  Question Answer Comment  Place call to: psych np   Reason for Consult Consult   Diagnosis/Clinical Info for Consult: mental health evaluation requested by patient      06/10/23 1008             Mode of Visit: In person    Psychiatry Consult Evaluation  Service Date: June 10, 2023 LOS:  LOS: 0 days  Chief Complaint "My place (apartment) is uncomfortable."  Primary Psychiatric Diagnoses  Schizoaffective disorder, bipolar type   Assessment  Anthony Luna is a 37 y.o. male admitted: Presented to the EDfor 06/10/2023  9:56 AM for mental health assistance, loneliness. He carries the psychiatric diagnoses of schizoaffective disorder, bipolar type and has a past medical history of none.   His current presentation of odd behaviors is most consistent with schizoaffective disorder, bipolar type . He meets criteria for discharge based on no threat to self or tohers.  Current outpatient psychotropic medications include see below and historically he has had a positive response to these medications. He was been compliant with medications prior to admission as evidenced by being in the hospital until yesterday. On initial examination, patient calm and cooperative, irritable when mentioned his ACT team. Please see plan below for detailed recommendations.   Diagnoses:  Active Hospital problems: Principal Problem:   Schizoaffective disorder, bipolar type (HCC)     Plan   ## Psychiatric Medication Recommendations:  Schizoaffective disorder, bipolar type: Eyvonne Mechanic on 12/2 Zyprexa 10 mg daily at bedtime Wellbutrin 150 mg daily at bedtime  EPS: Cogentin 1 mg BID  Anxiety: Klonopin 0.5 mg BID Hydroxyzine TID PRN  Insomnia: Melatonin 5 mg daily at bedtime Trazodone 50 mg daily at bedtime   ## Medical Decision Making Capacity: Not specifically addressed in this encounter  ## Further Work-up:  None   ## Disposition:-- ACT team to come and get the client with recommendation of a group home  ## Behavioral / Environmental: - No specific recommendations at this time.     ## Safety and Observation Level:  - Based on my clinical evaluation, I estimate the patient to be at low risk of self harm in the current setting. - At this time, we recommend  routine. This decision is based on my review of the chart including patient's history and current presentation, interview of the patient, mental status examination, and consideration of suicide risk including evaluating suicidal ideation, plan, intent, suicidal or self-harm behaviors, risk factors, and protective factors. This judgment is based on our ability to directly address suicide risk, implement suicide prevention strategies, and develop a safety plan while the patient is in the clinical setting. Please contact our team if there is a concern that risk level has changed.  CSSR Risk Category:C-SSRS RISK CATEGORY: No Risk  Suicide Risk Assessment: Patient has following modifiable risk factors for suicide: social isolation, which  we are addressing by discharging to his ACT team for a group home recommendation. Patient has following non-modifiable or demographic risk factors for suicide: male gender and psychiatric hospitalization Patient has the following protective factors against suicide: Access to outpatient mental health care and no history of NSSIB  Thank you for this consult request.  Recommendations have been communicated to the primary team.  We will discharge to his ACT team at this time.   Nanine Means, NP       History of Present Illness  Relevant Aspects of Hospital ED Course:  Admitted on 06/10/2023 for mental health request. They have been to the ED multiple times since early October with multiple admissions, discharged from [facility], came to the ED because he wanted to help, denied depression and psychosis.  He did complain of being lonely to the therapist.  He does have an ACT team and did not contact them about coming here. Anthony Luna lives alone which is not working out as he continues to come to the ED because he does not want to be alone or other complaints.  Denies suicidal/homicidal ideations and substance abuse.  He does state he has high anxiety, medications in place.  Recommend discharge to his ACT team with placement into a group home.  Per [facility]: Anthony Luna arrived to the ED by way of law enforcement. He identified that he called the police to get help. He shared that his heart was not feeling right.  He reports feeling lonely for about a year or two.  He shared that his placement is uncomfortable.  He shared that he stays with his father and he has problems with his family.  He denied symptoms of depression. He reports that his anxiety is high.  He shared that when people come around him when he is doing "something" his anxiety increases.  He denied having auditory or visual hallucinations.  He denied suicidal ideation or intent. He stated that he wanted to hurt other people, but declined to identify who. He did not identify a plan to harm others. He reports that he drinks two beers a day, but has not had any today. Although he denied having hallucinations, he appears to be responding to internal stimuli and was easily agitated. Anthony Luna that he wants help, but does not know what kind of help or what he wants help with.    Patient Report:  He does not like his apartment and is lonely  Psych ROS:  Depression: none Anxiety:  high Mania (lifetime and current): none currently Psychosis: (lifetime and current): past psychosis, none currently  Collateral information:  ACT team will be contacted  Review of Systems  All other systems reviewed and are negative.    Psychiatric and Social History  Psychiatric History:  Information collected from patient and chart  Prev Dx/Sx:  Current Psych Provider: Schizoaffective disorder, bipolar type Home Meds (current): see above  Previous Med Trials: Clozaril and others Therapy: ACT team  Prior Psych Hospitalization: multiple ones  Prior Self Harm: none Prior Violence: none  Family Psych History: none Family Hx suicide: none  Social History:  Living Situation: lives alone Access to weapons/lethal means: none   Substance History Denies all, past history of cannabis abuse  Exam Findings  Physical Exam: WDL Vital Signs:  Temp:  [98 F (36.7 C)] 98 F (36.7 C) (12/25 0936) Pulse Rate:  [97] 97 (12/25 0936) Resp:  [17] 17 (12/25 0936) BP: (120)/(72) 120/72 (12/25 0936)  SpO2:  [99 %] 99 % (12/25 0936) Weight:  [86.2 kg] 86.2 kg (12/25 0929) Blood pressure 120/72, pulse 97, temperature 98 F (36.7 C), temperature source Oral, resp. rate 17, height 6' (1.829 m), weight 86.2 kg, SpO2 99%. Body mass index is 25.77 kg/m.  Physical Exam Vitals and nursing note reviewed.     Mental Status Exam: General Appearance: Casual  Orientation:  Full (Time, Place, and Person)  Memory:  Immediate;   Good Recent;   Good Remote;   Good  Concentration:  Concentration: Fair and Attention Span: Fair  Recall:  Fair  Attention  Good  Eye Contact:  Good  Speech:  Normal Rate  Language:  Good  Volume:  Normal  Mood: anxious  Affect:  Blunt  Thought Process:  Coherent  Thought Content:  Logical  Suicidal Thoughts:  No  Homicidal Thoughts:  No  Judgement:   Fair  Insight:  Fair  Psychomotor Activity:  Normal  Akathisia:  No  Fund of Knowledge:  Fair      Assets:  Housing Physical Health Resilience Social Support  Cognition:  WNL  ADL's:  Intact  AIMS (if indicated):        Other History   These have been pulled in through the EMR, reviewed, and updated if appropriate.  Family History:  The patient's family history is not on file.  Medical History: Past Medical History:  Diagnosis Date   Depression    Since moving into new apartment 1 year ago   None to low serum cortisol response with adrenocorticotrophic hormone (ACTH) stimulation test    Schizophrenia, paranoid type [facility])     Surgical History: History reviewed. No pertinent surgical history.   Medications:   Current Facility-Administered Medications:    albuterol (VENTOLIN HFA) 108 (90 Base) MCG/ACT inhaler 1-2 puff, 1-2 puff, Inhalation, Q6H PRN, Sharyn Creamer, MD   benztropine (COGENTIN) tablet 1 mg, 1 mg, Oral, BID, Sharyn Creamer, MD, 1 mg at 06/10/23 1034   buPROPion (WELLBUTRIN XL) 24 hr tablet 150 mg, 150 mg, Oral, Daily, Sharyn Creamer, MD, 150 mg at 06/10/23 1035   clonazePAM (KLONOPIN) tablet 0.5 mg, 0.5 mg, Oral, BID, Sharyn Creamer, MD, 0.5 mg at 06/10/23 1035   docusate sodium (COLACE) capsule 100 mg, 100 mg, Oral, BID, Sharyn Creamer, MD, 100 mg at 06/10/23 1034   fluticasone (FLONASE) 50 MCG/ACT nasal spray 2 spray, 2 spray, Each Nare, Daily PRN, Sharyn Creamer, MD   hydrOXYzine (ATARAX) tablet 25 mg, 25 mg, Oral, TID PRN, Sharyn Creamer, MD   loratadine (CLARITIN) tablet 10 mg, 10 mg, Oral, Daily, Sharyn Creamer, MD, 10 mg at 06/10/23 1035   melatonin tablet 5 mg, 5 mg, Oral, QHS, Quale, Mark, MD   montelukast (SINGULAIR) tablet 10 mg, 10 mg, Oral, QHS, Quale, Mark, MD   nicotine (NICODERM CQ - dosed in mg/24 hours) patch 14 mg, 14 mg, Transdermal, Daily, Sharyn Creamer, MD, 14 mg at 06/10/23 1033   OLANZapine (ZYPREXA) tablet 10 mg, 10 mg, Oral, Q6H PRN, Sharyn Creamer,  MD  Current Outpatient Medications:    albuterol (VENTOLIN HFA) 108 (90 Base) MCG/ACT inhaler, Inhale 1-2 puffs into the lungs every 6 (six) hours as needed for shortness of breath., Disp: 18 g, Rfl: 1   benztropine (COGENTIN) 0.5 MG tablet, Take 2 tablets (1 mg total) by mouth 2 (two) times daily., Disp: 60 tablet, Rfl: 1   benztropine (COGENTIN) 1 MG tablet, Take 1 mg by mouth 2 (two) times daily., Disp: , Rfl:  buPROPion (WELLBUTRIN XL) 150 MG 24 hr tablet, Take 1 tablet (150 mg total) by mouth daily., Disp: 30 tablet, Rfl: 1   clonazePAM (KLONOPIN) 0.5 MG tablet, Take 1 tablet (0.5 mg total) by mouth 2 (two) times daily., Disp: 60 tablet, Rfl: 1   cloZAPine (CLOZARIL) 100 MG tablet, Take 300 mg by mouth at bedtime. Take along with one 50 mg tablet for total 350 mg at bedtime, Disp: , Rfl:    docusate sodium (COLACE) 100 MG capsule, Take 1 capsule (100 mg total) by mouth 2 (two) times daily., Disp: 60 capsule, Rfl: 1   fluticasone (FLONASE) 50 MCG/ACT nasal spray, Place 2 sprays into both nostrils daily as needed for allergies or rhinitis., Disp: 16 g, Rfl: 1   hydrOXYzine (ATARAX) 25 MG tablet, Take 1 tablet (25 mg total) by mouth 3 (three) times daily as needed for anxiety., Disp: 30 tablet, Rfl: 1   melatonin 5 MG TABS, Take 1 tablet (5 mg total) by mouth at bedtime., Disp: 30 tablet, Rfl: 1   montelukast (SINGULAIR) 10 MG tablet, Take 1 tablet (10 mg total) by mouth at bedtime., Disp: 30 tablet, Rfl: 1   nicotine (NICODERM CQ - DOSED IN MG/24 HOURS) 14 mg/24hr patch, Place 1 patch (14 mg total) onto the skin daily for 28 days., Disp: 28 patch, Rfl: 0   nicotine (NICODERM CQ - DOSED IN MG/24 HOURS) 21 mg/24hr patch, Place 1 patch (21 mg total) onto the skin daily., Disp: 28 patch, Rfl: 1   OLANZapine (ZYPREXA) 10 MG tablet, Take 1 tablet (10 mg total) by mouth every 6 (six) hours as needed (Psychosis)., Disp: 30 tablet, Rfl: 0   Paliperidone Palmitate (INVEGA TRINZA) 819 MG/2.625ML SUSP,  Inject 819 mg into the muscle every 30 (thirty) days., Disp: 2.625 mL, Rfl: 0   propranolol (INDERAL) 20 MG tablet, Take 1 tablet (20 mg total) by mouth 2 (two) times daily., Disp: 60 tablet, Rfl: 0   risperiDONE (RISPERDAL) 1 MG tablet, Take 1 tablet (1 mg total) by mouth 2 (two) times daily at 8 am and 4 pm., Disp: 60 tablet, Rfl: 0   traZODone (DESYREL) 50 MG tablet, Take 1 tablet (50 mg total) by mouth at bedtime as needed for sleep., Disp: 30 tablet, Rfl: 0   cetirizine (ZYRTEC) 10 MG tablet, Take 1 tablet (10 mg total) by mouth daily. (Patient not taking: Reported on 06/04/2023), Disp: 30 tablet, Rfl: 1   cloZAPine (CLOZARIL) 50 MG tablet, Take 7 tablets (350 mg total) by mouth at bedtime. (Patient not taking: Reported on 06/10/2023), Disp: 210 tablet, Rfl: 0   Vitamin D, Ergocalciferol, (DRISDOL) 1.25 MG (50000 UNIT) CAPS capsule, Take 1 capsule (50,000 Units total) by mouth every 7 (seven) days for 5 doses. (Patient not taking: Reported on 06/04/2023), Disp: 5 capsule, Rfl: 0  Allergies: Allergies  Allergen Reactions   Shellfish Allergy     Nanine Means, NP

## 2023-06-10 NOTE — ED Triage Notes (Addendum)
Pt to ED via [employer] from home for [facility] eval, pt states " I wanna be admitted here, I want to be helped". Pt here voluntarily, called police. States has been suicidal for months. Pt denies plan. Hx schizophrenia, states does take his meds. Pt and cooperative in triage but irritable when asked specific questions. Pt cut his inner R wrist "by accident" this morning with a piece of glass.   Pt answered "no" to SI screening questions.  Belongings bag 1/2: Black sweatpants, white socks, black sneakers, blue shirt, black long sleeve shirt  Bag 2/2: Beef jerky snacks

## 2023-06-10 NOTE — ED Notes (Signed)
Pt given snack at this time  

## 2023-06-10 NOTE — ED Provider Notes (Signed)
[facility] Provider Note    Event Date/Time   First MD Initiated Contact with Patient 06/10/23 [phone]     (approximate)   History   Psychiatry evaluation  HPI  Anthony Luna is a 37 y.o. male with a history of schizophrenia   Patient here for evaluation by psychiatry.  Recently hospitalized.  Denies suicidal ideation.  Suffers from schizophrenia.  Feels like he needs assistance and help.  Answers no to suicidal ideation questions.  His affect is rather flat.  He is in no acute distress.  Resting comfortably.  Relates no concerns of recent medical illness  Physical Exam   Triage Vital Signs: ED Triage Vitals  Encounter Vitals Group     BP 06/10/23 0936 120/72     Systolic BP Percentile --      Diastolic BP Percentile --      Pulse Rate 06/10/23 0936 97     Resp 06/10/23 0936 17     Temp 06/10/23 0936 98 F (36.7 C)     Temp Source 06/10/23 0936 Oral     SpO2 06/10/23 0936 99 %     Weight 06/10/23 0929 190 lb (86.2 kg)     Height 06/10/23 0929 6' (1.829 m)     Head Circumference --      Peak Flow --      Pain Score 06/10/23 0930 0     Pain Loc --      Pain Education --      Exclude from Growth Chart --     Most recent vital signs: Vitals:   06/10/23 0936  BP: 120/72  Pulse: 97  Resp: 17  Temp: 98 F (36.7 C)  SpO2: 99%     General: Awake, no distress.  Resting comfortably.  Abrasion to the right wrist.  Bandaged CV:  Good peripheral perfusion.  Resp:  Normal effort.  Abd:  No distention.  Other:  Moves all extremities well.  No deficits.  Maintains eye contact.  Affect is very flat, relates that he is here for evaluation and mental health.  Denies suicidal ideation   ED Results / Procedures / Treatments   Labs (all labs ordered are listed, but only abnormal results are displayed) Labs Reviewed  COMPREHENSIVE METABOLIC PANEL - Abnormal; Notable for the following components:      Result Value   Glucose, Bld 101 (*)     AST 82 (*)    ALT 85 (*)    All other components within normal limits  SALICYLATE LEVEL - Abnormal; Notable for the following components:   Salicylate Lvl <7.0 (*)    All other components within normal limits  ACETAMINOPHEN LEVEL - Abnormal; Notable for the following components:   Acetaminophen (Tylenol), Serum <10 (*)    All other components within normal limits  ETHANOL  CBC  URINE DRUG SCREEN, QUALITATIVE ([facility] ONLY)     PROCEDURES:  Critical Care performed: No  Procedures   MEDICATIONS ORDERED IN ED: Medications  albuterol (VENTOLIN HFA) 108 (90 Base) MCG/ACT inhaler 1-2 puff (has no administration in time range)  benztropine (COGENTIN) tablet 1 mg (has no administration in time range)  buPROPion (WELLBUTRIN XL) 24 hr tablet 150 mg (has no administration in time range)  loratadine (CLARITIN) tablet 10 mg (has no administration in time range)  clonazePAM (KLONOPIN) tablet 0.5 mg (has no administration in time range)  docusate sodium (COLACE) capsule 100 mg (has no administration in time range)  fluticasone (FLONASE) 50  MCG/ACT nasal spray 2 spray (has no administration in time range)  hydrOXYzine (ATARAX) tablet 25 mg (has no administration in time range)  melatonin tablet 5 mg (has no administration in time range)  montelukast (SINGULAIR) tablet 10 mg (has no administration in time range)  nicotine (NICODERM CQ - dosed in mg/24 hours) patch 14 mg (has no administration in time range)  OLANZapine (ZYPREXA) tablet 10 mg (has no administration in time range)     IMPRESSION / MDM / ASSESSMENT AND PLAN / ED COURSE  I reviewed the triage vital signs and the nursing notes.                              Differential diagnosis includes, but is not limited to, mental health evaluation, medical screening exam.  Denies suicidal ideations or suicidal thoughts.  He is requesting mental health evaluation.  He cannot tell me if he has been remained compliant with his medications, I  have ordered several of his previous medications for him today.  Denies any acute medical illness.  Medical screening exams note normal CBC metabolic panel reassuring.  A very mild but somewhat chronic transaminitis is present without associated abdominal symptoms.  Patient's presentation is most consistent with acute complicated illness / injury requiring diagnostic workup.  ----------------------------------------- 10:19 AM on 06/10/2023 ----------------------------------------- Adequate clear for psychiatric evaluation at this time.  Patient currently on voluntary status  He denies any suicidal ideation.  He does not appear to be responding to obvious internal or external stimuli.  He is awake and alert, comes voluntarily for mental health examination.  At this point I do not see findings or clinical assessment that would strongly indicate a need to be placed under involuntary commitment.         FINAL CLINICAL IMPRESSION(S) / ED DIAGNOSES   Final diagnoses:  Encounter for medical screening examination  Flat affect  Schizophrenia, unspecified type (HCC)     Rx / DC Orders   ED Discharge Orders     None        Note:  This document was prepared using Dragon voice recognition software and may include unintentional dictation errors.   Sharyn Creamer, MD 06/10/23 1020

## 2023-06-10 NOTE — ED Provider Notes (Signed)
Ongoing care including follow-up on final recommendations from psychiatry assigned to my partner Dr. Maxie Better, MD 06/10/23 [phone]

## 2023-06-10 NOTE — ED Notes (Signed)
PT states he wants to be discharged, RN made aware and states she will talk to the doc, comments show VOL pending discharge.

## 2023-06-11 ENCOUNTER — Emergency Department
Admission: EM | Admit: 2023-06-11 | Discharge: 2023-06-11 | Disposition: A | Discharge status: Home / Self Care | Payer: MEDICAID | Attending: Emergency Medicine | Admitting: Emergency Medicine

## 2023-06-11 ENCOUNTER — Encounter: Payer: Self-pay | Admitting: Emergency Medicine

## 2023-06-11 ENCOUNTER — Other Ambulatory Visit: Payer: Self-pay

## 2023-06-11 DIAGNOSIS — Y93H9 Activity, other involving exterior property and land maintenance, building and construction: Secondary | ICD-10-CM | POA: Insufficient documentation

## 2023-06-11 DIAGNOSIS — W25XXXA Contact with sharp glass, initial encounter: Secondary | ICD-10-CM | POA: Insufficient documentation

## 2023-06-11 DIAGNOSIS — Z23 Encounter for immunization: Secondary | ICD-10-CM | POA: Diagnosis not present

## 2023-06-11 DIAGNOSIS — S6992XA Unspecified injury of left wrist, hand and finger(s), initial encounter: Secondary | ICD-10-CM | POA: Diagnosis present

## 2023-06-11 DIAGNOSIS — S61215A Laceration without foreign body of left ring finger without damage to nail, initial encounter: Secondary | ICD-10-CM | POA: Diagnosis not present

## 2023-06-11 MED ORDER — TETANUS-DIPHTH-ACELL PERTUSSIS 5-2.5-18.5 LF-MCG/0.5 IM SUSY
0.5000 mL | PREFILLED_SYRINGE | Freq: Once | INTRAMUSCULAR | Status: AC
  Administered 2023-06-11: 0.5 mL via INTRAMUSCULAR
  Filled 2023-06-11: qty 0.5

## 2023-06-11 NOTE — ED Provider Notes (Signed)
[facility] Provider Note    Event Date/Time   First MD Initiated Contact with Patient 06/11/23 0720     (approximate)   History   Laceration   HPI Anthony Luna is a 37 y.o. male presenting today for finger laceration.  Patient reportedly cut his left ring finger on glass while breaking into a house.  Superficial laceration present.  No numbness or tingling.  No injury elsewhere.  He is not sure of his last tetanus shot.     Physical Exam   Triage Vital Signs: ED Triage Vitals  Encounter Vitals Group     BP      Systolic BP Percentile      Diastolic BP Percentile      Pulse      Resp      Temp      Temp src      SpO2      Weight      Height      Head Circumference      Peak Flow      Pain Score      Pain Loc      Pain Education      Exclude from Growth Chart     Most recent vital signs: Vitals:   06/11/23 0725  BP: 112/71  Pulse: 82  Resp: 17  Temp: 97.6 F (36.4 C)  SpO2: 99%    I have reviewed the vital signs. General:  Awake, alert, no acute distress. Head:  Normocephalic, Atraumatic. EENT:  PERRL, EOMI, Oral mucosa pink and moist, Neck is supple. Cardiovascular: Regular rate, 2+ distal pulses. Respiratory:  Normal respiratory effort, symmetrical expansion, no distress.   Extremities:  Moving all four extremities through full ROM without pain.   Neuro:  Alert and oriented.  Interacting appropriately.   Skin:  Warm, dry, no rash.  4 cm L-shaped superficial laceration to palmar surface of left ring finger from the lateral edge of the distal phalanx down to the proximal phalanx before going horizontally across. Psych: Appropriate affect.    ED Results / Procedures / Treatments   Labs (all labs ordered are listed, but only abnormal results are displayed) Labs Reviewed - No data to display   EKG    RADIOLOGY    PROCEDURES:  Critical Care performed: No  .Laceration Repair  Date/Time: 06/11/2023 7:47  AM  Performed by: Janith Lima, MD Authorized by: Janith Lima, MD   Consent:    Consent obtained:  Verbal   Consent given by:  Patient   Risks, benefits, and alternatives were discussed: yes     Alternatives discussed:  No treatment Universal protocol:    Patient identity confirmed:  Verbally with patient Anesthesia:    Anesthesia method:  None Laceration details:    Location:  Finger   Finger location:  L ring finger   Length (cm):  4   Depth (mm):  0.5 Pre-procedure details:    Preparation:  Patient was prepped and draped in usual sterile fashion Exploration:    Hemostasis achieved with:  Direct pressure   Wound exploration: wound explored through full range of motion   Treatment:    Area cleansed with:  Chlorhexidine   Amount of cleaning:  Standard   Irrigation solution:  Sterile saline   Irrigation method:  Tap   Visualized foreign bodies/material removed: no   Skin repair:    Repair method:  Tissue adhesive Approximation:    Approximation:  Close  Repair type:    Repair type:  Simple Post-procedure details:    Dressing:  Non-adherent dressing   Procedure completion:  Tolerated well, no immediate complications    MEDICATIONS ORDERED IN ED: Medications  Tdap (BOOSTRIX) injection 0.5 mL (has no administration in time range)     IMPRESSION / MDM / ASSESSMENT AND PLAN / ED COURSE  I reviewed the triage vital signs and the nursing notes.                              Differential diagnosis includes, but is not limited to, superficial finger laceration  Patient's presentation is most consistent with acute, uncomplicated illness.  Patient is a 37 year old male presenting today for superficial laceration to his left index finger.  Full range of the wound easily explored with gross visualization.  No need for x-rays given no additional trauma or pain symptoms.  Given superficial nature, was closed with Dermabond.  Finger splint was placed to restrict range of  motion while allowing for wound to close.  Patient was safe for discharge and given strict return precautions.     FINAL CLINICAL IMPRESSION(S) / ED DIAGNOSES   Final diagnoses:  Laceration of left ring finger without foreign body without damage to nail, initial encounter     Rx / DC Orders   ED Discharge Orders     None        Note:  This document was prepared using Dragon voice recognition software and may include unintentional dictation errors.   Janith Lima, MD 06/11/23 [phone]

## 2023-06-11 NOTE — Discharge Instructions (Signed)
Please keep the splint on for the next 24 to 48 hours.  You can then take it off and place Band-Aids over the area until it is fully healed.  Once the splint comes off you can gently wash with soap and water.

## 2023-06-11 NOTE — ED Triage Notes (Signed)
Presents via EMS   States she has laceration to left hand /finger  States he cut it on glass while trying to break in to a house
